# Patient Record
Sex: Female | Born: 1945
Health system: Southern US, Community
[De-identification: ages and names within clinical notes are randomized; demographics above are authoritative.]

## PROBLEM LIST (undated history)

## (undated) DIAGNOSIS — I351 Nonrheumatic aortic (valve) insufficiency: Secondary | ICD-10-CM

## (undated) DIAGNOSIS — E119 Type 2 diabetes mellitus without complications: Secondary | ICD-10-CM

## (undated) DIAGNOSIS — I2510 Atherosclerotic heart disease of native coronary artery without angina pectoris: Secondary | ICD-10-CM

## (undated) DIAGNOSIS — I700 Atherosclerosis of aorta: Secondary | ICD-10-CM

## (undated) DIAGNOSIS — Z7901 Long term (current) use of anticoagulants: Secondary | ICD-10-CM

## (undated) DIAGNOSIS — J410 Simple chronic bronchitis: Secondary | ICD-10-CM

## (undated) DIAGNOSIS — E782 Mixed hyperlipidemia: Secondary | ICD-10-CM

## (undated) DIAGNOSIS — S62102A Fracture of unspecified carpal bone, left wrist, initial encounter for closed fracture: Secondary | ICD-10-CM

## (undated) DIAGNOSIS — M1990 Unspecified osteoarthritis, unspecified site: Secondary | ICD-10-CM

## (undated) DIAGNOSIS — Z905 Acquired absence of kidney: Secondary | ICD-10-CM

## (undated) DIAGNOSIS — I4510 Unspecified right bundle-branch block: Secondary | ICD-10-CM

## (undated) DIAGNOSIS — R931 Abnormal findings on diagnostic imaging of heart and coronary circulation: Secondary | ICD-10-CM

## (undated) DIAGNOSIS — E039 Hypothyroidism, unspecified: Secondary | ICD-10-CM

## (undated) DIAGNOSIS — C641 Malignant neoplasm of right kidney, except renal pelvis: Secondary | ICD-10-CM

## (undated) DIAGNOSIS — I10 Essential (primary) hypertension: Secondary | ICD-10-CM

## (undated) DIAGNOSIS — I4819 Other persistent atrial fibrillation: Secondary | ICD-10-CM

## (undated) DIAGNOSIS — J449 Chronic obstructive pulmonary disease, unspecified: Secondary | ICD-10-CM

## (undated) DIAGNOSIS — N183 Chronic kidney disease, stage 3 unspecified: Secondary | ICD-10-CM

## (undated) DIAGNOSIS — E669 Obesity, unspecified: Secondary | ICD-10-CM

## (undated) DIAGNOSIS — N189 Chronic kidney disease, unspecified: Secondary | ICD-10-CM

## (undated) DIAGNOSIS — E785 Hyperlipidemia, unspecified: Secondary | ICD-10-CM

## (undated) HISTORY — DX: Obesity, unspecified: E66.9

## (undated) HISTORY — DX: Atherosclerosis of aorta: I70.0

## (undated) HISTORY — DX: Atherosclerotic heart disease of native coronary artery without angina pectoris: I25.10

## (undated) HISTORY — DX: Nonrheumatic aortic (valve) insufficiency: I35.1

## (undated) HISTORY — DX: Abnormal findings on diagnostic imaging of heart and coronary circulation: R93.1

## (undated) HISTORY — DX: Essential (primary) hypertension: I10

## (undated) HISTORY — DX: Other persistent atrial fibrillation: I48.19

## (undated) HISTORY — DX: Hyperlipidemia, unspecified: E78.5

## (undated) HISTORY — DX: Unspecified right bundle-branch block: I45.10

---

## 1978-02-13 HISTORY — PX: TUBAL LIGATION: SHX77

## 2010-08-25 ENCOUNTER — Encounter: Payer: Self-pay | Admitting: Gastroenterology

## 2010-10-04 ENCOUNTER — Encounter: Payer: Self-pay | Admitting: Gastroenterology

## 2010-10-04 ENCOUNTER — Ambulatory Visit (INDEPENDENT_AMBULATORY_CARE_PROVIDER_SITE_OTHER): Payer: 59 | Admitting: Gastroenterology

## 2010-10-04 DIAGNOSIS — E669 Obesity, unspecified: Secondary | ICD-10-CM

## 2010-10-04 DIAGNOSIS — E119 Type 2 diabetes mellitus without complications: Secondary | ICD-10-CM | POA: Insufficient documentation

## 2010-10-04 DIAGNOSIS — Z1211 Encounter for screening for malignant neoplasm of colon: Secondary | ICD-10-CM

## 2010-10-04 DIAGNOSIS — Z6835 Body mass index (BMI) 35.0-35.9, adult: Secondary | ICD-10-CM | POA: Insufficient documentation

## 2010-10-04 MED ORDER — PEG-KCL-NACL-NASULF-NA ASC-C 100 G PO SOLR: 1.0000 | ORAL | Status: DC

## 2010-10-04 NOTE — Progress Notes (Signed)
HPI: This is a  very pleasant 65 year old woman who is here to discuss colon cancer screening.  She had a colonoscopy, bad experience.  She says the sedation was terrible.  No polyps.  No overt GI bleeding, no bowel changes.  Overall stable weight.    Her daughter is Karie Mainland at [employer].    Review of systems: Pertinent positive and negative review of systems were noted in the above HPI section.  All other review of systems was otherwise negative.   Past Medical History  Diagnosis Date  . Atrial fibrillation   . Diabetes mellitus   . Hyperlipidemia   . Hypertension   . Obesity     Past Surgical History  Procedure Date  . Tubal ligation 1980     reports that she has quit smoking. She has never used smokeless tobacco. She reports that she does not drink alcohol or use illicit drugs.  family history includes Cancer in her mother and Diabetes in her father.  There is no history of Colon cancer.    Current Medications, Allergies were all reviewed with the patient via [facility] electronic medical record system.    Physical Exam: BP 132/76  Pulse 88  Ht 5\' 5"  (1.651 m)  Wt 245 lb (111.131 kg)  BMI 40.77 kg/m2 Constitutional: generally well-appearing Psychiatric: alert and oriented x3 Eyes: extraocular movements intact Mouth: oral pharynx moist, no lesions Neck: supple no lymphadenopathy Cardiovascular: heart regular rate and rhythm Lungs: clear to auscultation bilaterally Abdomen: soft, nontender, nondistended, no obvious ascites, no peritoneal signs, normal bowel sounds Extremities: no lower extremity edema bilaterally Skin: no lesions on visible extremities    Assessment and plan: 65 y.o. female with routine risk for colon cancer  She had difficult sedation at the time of her previous colonoscopy, about 10 years ago. We will therefore schedule her for repeat colonoscopy at [facility] with propofol sedation. I see no reason for any further  blood tests or imaging studies prior to then.

## 2010-10-04 NOTE — Patient Instructions (Signed)
You will be set up for a colonoscopy at [facility] with propofol (first case of the day, please). A copy of this information will be made available to Dr. Valentina Lucks.

## 2010-10-05 ENCOUNTER — Telehealth: Payer: Self-pay | Admitting: Gastroenterology

## 2010-10-05 ENCOUNTER — Encounter: Payer: Self-pay | Admitting: Gastroenterology

## 2010-10-05 NOTE — Telephone Encounter (Signed)
Schedule not out for nov will call pt in late sept to reschedule

## 2010-12-14 ENCOUNTER — Telehealth: Payer: Self-pay | Admitting: Gastroenterology

## 2010-12-14 NOTE — Telephone Encounter (Signed)
Pt wants to have her Daughter call and speak to me about her procedure, she is giving a verbal ok

## 2010-12-15 NOTE — Telephone Encounter (Signed)
[phone] Latasha Hodges the pt daughter works at [employer] endo and states that the pt or the daughter does not want the pt to have propofol for her procedure.  She says that the experience at her last colon was a bad experience from the start and not from the anesthesia.   Can we switch to moderate sedation?

## 2010-12-15 NOTE — Telephone Encounter (Signed)
Yes, it can be with moderate sedation.  thanks

## 2010-12-16 NOTE — Telephone Encounter (Signed)
Case was changed to moderate sedation and pre admit appt was cx.  Debbie her daughter is aware

## 2010-12-26 ENCOUNTER — Inpatient Hospital Stay (HOSPITAL_COMMUNITY): Admission: RE | Admit: 2010-12-26 | Payer: 59 | Source: Ambulatory Visit

## 2010-12-26 ENCOUNTER — Telehealth: Payer: Self-pay | Admitting: Gastroenterology

## 2010-12-26 NOTE — Telephone Encounter (Signed)
Left message on machine to call back  

## 2010-12-26 NOTE — Telephone Encounter (Signed)
Pt questions were answered and asked to have a copy of her instructions faxed to her to [phone]

## 2010-12-29 ENCOUNTER — Ambulatory Visit (HOSPITAL_COMMUNITY)
Admission: RE | Admit: 2010-12-29 | Discharge: 2010-12-29 | Disposition: A | Discharge status: Home / Self Care | Payer: 59 | Source: Ambulatory Visit | Attending: Gastroenterology | Admitting: Gastroenterology

## 2010-12-29 ENCOUNTER — Encounter (HOSPITAL_COMMUNITY)
Admission: RE | Disposition: A | Discharge status: Home / Self Care | Payer: Self-pay | Source: Ambulatory Visit | Attending: Gastroenterology

## 2010-12-29 ENCOUNTER — Encounter (HOSPITAL_COMMUNITY): Payer: Self-pay | Admitting: *Deleted

## 2010-12-29 ENCOUNTER — Other Ambulatory Visit: Payer: 59 | Admitting: Gastroenterology

## 2010-12-29 DIAGNOSIS — K5730 Diverticulosis of large intestine without perforation or abscess without bleeding: Secondary | ICD-10-CM | POA: Insufficient documentation

## 2010-12-29 DIAGNOSIS — I10 Essential (primary) hypertension: Secondary | ICD-10-CM | POA: Insufficient documentation

## 2010-12-29 DIAGNOSIS — Z1211 Encounter for screening for malignant neoplasm of colon: Secondary | ICD-10-CM | POA: Insufficient documentation

## 2010-12-29 DIAGNOSIS — Z87891 Personal history of nicotine dependence: Secondary | ICD-10-CM | POA: Insufficient documentation

## 2010-12-29 DIAGNOSIS — I4891 Unspecified atrial fibrillation: Secondary | ICD-10-CM | POA: Insufficient documentation

## 2010-12-29 DIAGNOSIS — E119 Type 2 diabetes mellitus without complications: Secondary | ICD-10-CM | POA: Insufficient documentation

## 2010-12-29 DIAGNOSIS — E785 Hyperlipidemia, unspecified: Secondary | ICD-10-CM | POA: Insufficient documentation

## 2010-12-29 DIAGNOSIS — Z800 Family history of malignant neoplasm of digestive organs: Secondary | ICD-10-CM | POA: Insufficient documentation

## 2010-12-29 DIAGNOSIS — E669 Obesity, unspecified: Secondary | ICD-10-CM | POA: Insufficient documentation

## 2010-12-29 HISTORY — PX: COLONOSCOPY: SHX5424

## 2010-12-29 HISTORY — DX: Simple chronic bronchitis: J41.0

## 2010-12-29 SURGERY — COLONOSCOPY
Anesthesia: Moderate Sedation

## 2010-12-29 MED ORDER — DIPHENHYDRAMINE HCL 50 MG/ML IJ SOLN
INTRAMUSCULAR | Status: AC
  Filled 2010-12-29: qty 1

## 2010-12-29 MED ORDER — FENTANYL NICU IV SYRINGE 50 MCG/ML
INJECTION | INTRAMUSCULAR | Status: DC | PRN
  Administered 2010-12-29 (×4): 25 ug via INTRAVENOUS

## 2010-12-29 MED ORDER — MIDAZOLAM HCL 10 MG/2ML IJ SOLN
INTRAMUSCULAR | Status: AC
  Filled 2010-12-29: qty 4

## 2010-12-29 MED ORDER — SODIUM CHLORIDE 0.9 % IV SOLN
Freq: Once | INTRAVENOUS | Status: AC
  Administered 2010-12-29: 500 mL via INTRAVENOUS

## 2010-12-29 MED ORDER — FENTANYL CITRATE 0.05 MG/ML IJ SOLN
INTRAMUSCULAR | Status: AC
  Filled 2010-12-29: qty 4

## 2010-12-29 MED ORDER — MIDAZOLAM HCL 5 MG/5ML IJ SOLN
INTRAMUSCULAR | Status: DC | PRN
  Administered 2010-12-29 (×3): 2 mg via INTRAVENOUS

## 2010-12-29 NOTE — H&P (Signed)
HPI: This is a woman who is here for routine colonoscopy.  Routine risk    Past Medical History  Diagnosis Date  . Atrial fibrillation   . Diabetes mellitus   . Hyperlipidemia   . Hypertension   . Obesity   . Bronchitis, chronic, simple     Past Surgical History  Procedure Date  . Tubal ligation 1980    Current Facility-Administered Medications  Medication Dose Route Frequency Provider Last Rate Last Dose  . 0.9 %  sodium chloride infusion   Intravenous Once Rob Bunting, MD 20 mL/hr at 12/29/10 0713 500 mL at 12/29/10 0713    Allergies as of 11/24/2010 - Review Complete 10/04/2010  Allergen Reaction Noted  . Iohexol  10/04/2010  . Penicillins  10/04/2010  . Sulfa drugs cross reactors  10/04/2010    Family History  Problem Relation Age of Onset  . Cancer Mother     BRAIN/THROAT  . Diabetes Father   . Hypertension Father   . Colon cancer Neg Hx   . Colonic polyp Cousin     History   Social History  . Marital Status: Divorced    Spouse Name: N/A    Number of Children: 2  . Years of Education: N/A   Occupational History  . ADMIN ASSISTANT    Social History Main Topics  . Smoking status: Former Smoker    Quit date: 12/28/1981  . Smokeless tobacco: Never Used  . Alcohol Use: No  . Drug Use: No  . Sexually Active: No   Other Topics Concern  . Not on file   Social History Narrative   0 caffeine drinks daily       Physical Exam: BP 140/75  Pulse 70  Temp(Src) 98.6 F (37 C) (Oral)  Resp 14  SpO2 96% Constitutional: generally well-appearing Psychiatric: alert and oriented x3 Abdomen: soft, nontender, nondistended, no obvious ascites, no peritoneal signs, normal bowel sounds     Assessment and plan: 65 y.o. female with routine risk for CRC  Colonoscopy today

## 2010-12-30 ENCOUNTER — Encounter (HOSPITAL_COMMUNITY): Payer: Self-pay

## 2011-01-10 ENCOUNTER — Encounter (HOSPITAL_COMMUNITY): Payer: Self-pay | Admitting: Gastroenterology

## 2012-07-01 DIAGNOSIS — Z23 Encounter for immunization: Secondary | ICD-10-CM | POA: Diagnosis not present

## 2012-07-01 DIAGNOSIS — I4891 Unspecified atrial fibrillation: Secondary | ICD-10-CM | POA: Diagnosis not present

## 2012-07-01 DIAGNOSIS — E782 Mixed hyperlipidemia: Secondary | ICD-10-CM | POA: Diagnosis not present

## 2012-07-01 DIAGNOSIS — I119 Hypertensive heart disease without heart failure: Secondary | ICD-10-CM | POA: Diagnosis not present

## 2012-09-23 DIAGNOSIS — I4891 Unspecified atrial fibrillation: Secondary | ICD-10-CM | POA: Diagnosis not present

## 2012-09-23 DIAGNOSIS — I10 Essential (primary) hypertension: Secondary | ICD-10-CM | POA: Diagnosis not present

## 2012-09-23 DIAGNOSIS — R002 Palpitations: Secondary | ICD-10-CM | POA: Diagnosis not present

## 2012-09-24 DIAGNOSIS — I4891 Unspecified atrial fibrillation: Secondary | ICD-10-CM | POA: Diagnosis not present

## 2012-09-26 DIAGNOSIS — R002 Palpitations: Secondary | ICD-10-CM | POA: Diagnosis not present

## 2012-09-26 DIAGNOSIS — I10 Essential (primary) hypertension: Secondary | ICD-10-CM | POA: Diagnosis not present

## 2012-09-26 DIAGNOSIS — I4891 Unspecified atrial fibrillation: Secondary | ICD-10-CM | POA: Diagnosis not present

## 2012-11-07 DIAGNOSIS — I4891 Unspecified atrial fibrillation: Secondary | ICD-10-CM | POA: Diagnosis not present

## 2012-11-07 DIAGNOSIS — R002 Palpitations: Secondary | ICD-10-CM | POA: Diagnosis not present

## 2012-11-07 DIAGNOSIS — I10 Essential (primary) hypertension: Secondary | ICD-10-CM | POA: Diagnosis not present

## 2012-12-18 DIAGNOSIS — M48061 Spinal stenosis, lumbar region without neurogenic claudication: Secondary | ICD-10-CM | POA: Diagnosis not present

## 2013-01-01 ENCOUNTER — Other Ambulatory Visit: Payer: Self-pay | Admitting: Neurological Surgery

## 2013-01-15 ENCOUNTER — Ambulatory Visit
Admission: RE | Admit: 2013-01-15 | Discharge: 2013-01-15 | Disposition: A | Discharge status: Home / Self Care | Payer: 59 | Source: Ambulatory Visit | Attending: Neurological Surgery | Admitting: Neurological Surgery

## 2013-01-15 DIAGNOSIS — M48061 Spinal stenosis, lumbar region without neurogenic claudication: Secondary | ICD-10-CM | POA: Diagnosis not present

## 2013-01-15 DIAGNOSIS — M5126 Other intervertebral disc displacement, lumbar region: Secondary | ICD-10-CM | POA: Diagnosis not present

## 2013-01-15 DIAGNOSIS — M4310 Spondylolisthesis, site unspecified: Secondary | ICD-10-CM | POA: Diagnosis not present

## 2013-01-17 ENCOUNTER — Telehealth: Payer: Self-pay | Admitting: Cardiology

## 2013-01-17 DIAGNOSIS — M4310 Spondylolisthesis, site unspecified: Secondary | ICD-10-CM | POA: Insufficient documentation

## 2013-01-17 DIAGNOSIS — M48061 Spinal stenosis, lumbar region without neurogenic claudication: Secondary | ICD-10-CM | POA: Diagnosis not present

## 2013-01-17 NOTE — Telephone Encounter (Signed)
To Dr Turner to advise 

## 2013-01-17 NOTE — Telephone Encounter (Signed)
New Problem:  Meloxicam 15 mg take once a day... Methocarbamol 500 mg take one every 6 hrs prn for spasms. Pt is wanting to make sure these two medications are ok for her to take. Pt states she was given them today at [facility]. Pt is requesting a call back

## 2013-01-20 NOTE — Telephone Encounter (Signed)
Follow up    Pt would like a call back med in previous message, 4 pm the lastess before she goes and fills scprit.

## 2013-01-20 NOTE — Telephone Encounter (Signed)
Pt is aware.  

## 2013-01-20 NOTE — Telephone Encounter (Signed)
Those are fine to take with her heart meds

## 2013-01-20 NOTE — Telephone Encounter (Signed)
Waiting on response from Dr Turner 

## 2013-03-18 ENCOUNTER — Encounter: Payer: Self-pay | Admitting: General Surgery

## 2013-03-27 ENCOUNTER — Ambulatory Visit: Payer: 59 | Admitting: Cardiology

## 2013-03-30 ENCOUNTER — Encounter: Payer: Self-pay | Admitting: *Deleted

## 2013-04-01 ENCOUNTER — Telehealth: Payer: Self-pay | Admitting: Nurse Practitioner

## 2013-04-01 MED ORDER — METOPROLOL TARTRATE 50 MG PO TABS: 50.0000 mg | ORAL_TABLET | Freq: Every day | ORAL | Status: DC

## 2013-04-01 NOTE — Telephone Encounter (Signed)
Phone call today  Patient requesting her Metoprolol 50 mg po daily.  Looks like she cancelled her last visit. Not clear to me as to when she was last seen. Has no upcoming visit.  Only sent in #90 with no refills. She was asked to call the office tomorrow to schedule follow up.  Patient is agreeable to this plan and will call if any problems develop in the interim.   Burtis Junes, RN, [place] [address] [place], [place]  [address] [phone]

## 2013-04-04 ENCOUNTER — Telehealth: Payer: Self-pay | Admitting: Cardiology

## 2013-04-04 MED ORDER — METOPROLOL SUCCINATE ER 50 MG PO TB24: ORAL_TABLET | ORAL | Status: DC

## 2013-04-04 NOTE — Telephone Encounter (Signed)
Refilled Toprol XL and pt will pick rx up today. Annual appt made.

## 2013-04-04 NOTE — Telephone Encounter (Signed)
New message     Refill metoprolol succinate 50mg ---[facility]   Pt has not seen dr turner in the new office.  Pt called after hrs doctor and they called in the wrong medication (according to the pharmacy).  Pt want to talk to a nurse to see what she can do with the wrong medication

## 2013-04-15 DIAGNOSIS — I10 Essential (primary) hypertension: Secondary | ICD-10-CM | POA: Diagnosis not present

## 2013-04-15 DIAGNOSIS — E782 Mixed hyperlipidemia: Secondary | ICD-10-CM | POA: Diagnosis not present

## 2013-04-15 DIAGNOSIS — I4891 Unspecified atrial fibrillation: Secondary | ICD-10-CM | POA: Diagnosis not present

## 2013-04-15 DIAGNOSIS — IMO0001 Reserved for inherently not codable concepts without codable children: Secondary | ICD-10-CM | POA: Diagnosis not present

## 2013-04-15 DIAGNOSIS — M4800 Spinal stenosis, site unspecified: Secondary | ICD-10-CM | POA: Diagnosis not present

## 2013-04-15 DIAGNOSIS — E669 Obesity, unspecified: Secondary | ICD-10-CM | POA: Diagnosis not present

## 2013-04-15 DIAGNOSIS — I119 Hypertensive heart disease without heart failure: Secondary | ICD-10-CM | POA: Diagnosis not present

## 2013-04-24 DIAGNOSIS — I10 Essential (primary) hypertension: Secondary | ICD-10-CM | POA: Diagnosis not present

## 2013-04-24 DIAGNOSIS — Z6834 Body mass index (BMI) 34.0-34.9, adult: Secondary | ICD-10-CM | POA: Diagnosis not present

## 2013-04-24 DIAGNOSIS — M4310 Spondylolisthesis, site unspecified: Secondary | ICD-10-CM | POA: Diagnosis not present

## 2013-04-29 ENCOUNTER — Other Ambulatory Visit: Payer: Self-pay | Admitting: Cardiology

## 2013-04-29 ENCOUNTER — Other Ambulatory Visit: Payer: Self-pay

## 2013-04-29 DIAGNOSIS — R293 Abnormal posture: Secondary | ICD-10-CM | POA: Diagnosis not present

## 2013-04-29 DIAGNOSIS — M545 Low back pain, unspecified: Secondary | ICD-10-CM | POA: Diagnosis not present

## 2013-04-29 DIAGNOSIS — R262 Difficulty in walking, not elsewhere classified: Secondary | ICD-10-CM | POA: Diagnosis not present

## 2013-04-29 DIAGNOSIS — M25569 Pain in unspecified knee: Secondary | ICD-10-CM | POA: Diagnosis not present

## 2013-04-29 MED ORDER — FLECAINIDE ACETATE 50 MG PO TABS: 100.0000 mg | ORAL_TABLET | Freq: Two times a day (BID) | ORAL | Status: DC

## 2013-05-01 DIAGNOSIS — M545 Low back pain, unspecified: Secondary | ICD-10-CM | POA: Diagnosis not present

## 2013-05-01 DIAGNOSIS — R262 Difficulty in walking, not elsewhere classified: Secondary | ICD-10-CM | POA: Diagnosis not present

## 2013-05-01 DIAGNOSIS — M25569 Pain in unspecified knee: Secondary | ICD-10-CM | POA: Diagnosis not present

## 2013-05-01 DIAGNOSIS — R293 Abnormal posture: Secondary | ICD-10-CM | POA: Diagnosis not present

## 2013-05-06 ENCOUNTER — Telehealth: Payer: Self-pay | Admitting: Cardiology

## 2013-05-06 ENCOUNTER — Other Ambulatory Visit: Payer: Self-pay

## 2013-05-06 DIAGNOSIS — R293 Abnormal posture: Secondary | ICD-10-CM | POA: Diagnosis not present

## 2013-05-06 DIAGNOSIS — R262 Difficulty in walking, not elsewhere classified: Secondary | ICD-10-CM | POA: Diagnosis not present

## 2013-05-06 DIAGNOSIS — M545 Low back pain, unspecified: Secondary | ICD-10-CM | POA: Diagnosis not present

## 2013-05-06 DIAGNOSIS — M25569 Pain in unspecified knee: Secondary | ICD-10-CM | POA: Diagnosis not present

## 2013-05-06 MED ORDER — FLECAINIDE ACETATE 100 MG PO TABS: ORAL_TABLET | ORAL | Status: DC

## 2013-05-07 DIAGNOSIS — M25569 Pain in unspecified knee: Secondary | ICD-10-CM | POA: Diagnosis not present

## 2013-05-07 DIAGNOSIS — M545 Low back pain, unspecified: Secondary | ICD-10-CM | POA: Diagnosis not present

## 2013-05-07 DIAGNOSIS — R262 Difficulty in walking, not elsewhere classified: Secondary | ICD-10-CM | POA: Diagnosis not present

## 2013-05-07 DIAGNOSIS — R293 Abnormal posture: Secondary | ICD-10-CM | POA: Diagnosis not present

## 2013-05-12 DIAGNOSIS — R262 Difficulty in walking, not elsewhere classified: Secondary | ICD-10-CM | POA: Diagnosis not present

## 2013-05-12 DIAGNOSIS — M545 Low back pain, unspecified: Secondary | ICD-10-CM | POA: Diagnosis not present

## 2013-05-12 DIAGNOSIS — R293 Abnormal posture: Secondary | ICD-10-CM | POA: Diagnosis not present

## 2013-05-12 DIAGNOSIS — M25569 Pain in unspecified knee: Secondary | ICD-10-CM | POA: Diagnosis not present

## 2013-05-13 DIAGNOSIS — M545 Low back pain, unspecified: Secondary | ICD-10-CM | POA: Diagnosis not present

## 2013-05-13 DIAGNOSIS — R262 Difficulty in walking, not elsewhere classified: Secondary | ICD-10-CM | POA: Diagnosis not present

## 2013-05-13 DIAGNOSIS — M25569 Pain in unspecified knee: Secondary | ICD-10-CM | POA: Diagnosis not present

## 2013-05-13 DIAGNOSIS — R293 Abnormal posture: Secondary | ICD-10-CM | POA: Diagnosis not present

## 2013-05-26 DIAGNOSIS — M545 Low back pain, unspecified: Secondary | ICD-10-CM | POA: Diagnosis not present

## 2013-05-26 DIAGNOSIS — M25569 Pain in unspecified knee: Secondary | ICD-10-CM | POA: Diagnosis not present

## 2013-05-26 DIAGNOSIS — R293 Abnormal posture: Secondary | ICD-10-CM | POA: Diagnosis not present

## 2013-05-26 DIAGNOSIS — R262 Difficulty in walking, not elsewhere classified: Secondary | ICD-10-CM | POA: Diagnosis not present

## 2013-05-27 ENCOUNTER — Ambulatory Visit (INDEPENDENT_AMBULATORY_CARE_PROVIDER_SITE_OTHER): Payer: Medicare Other | Admitting: Cardiology

## 2013-05-27 ENCOUNTER — Encounter: Payer: Self-pay | Admitting: Cardiology

## 2013-05-27 VITALS — BP 148/78 | HR 64 | Ht 65.0 in | Wt 236.0 lb

## 2013-05-27 DIAGNOSIS — R262 Difficulty in walking, not elsewhere classified: Secondary | ICD-10-CM | POA: Diagnosis not present

## 2013-05-27 DIAGNOSIS — R293 Abnormal posture: Secondary | ICD-10-CM | POA: Diagnosis not present

## 2013-05-27 DIAGNOSIS — I4891 Unspecified atrial fibrillation: Secondary | ICD-10-CM | POA: Diagnosis not present

## 2013-05-27 DIAGNOSIS — M545 Low back pain, unspecified: Secondary | ICD-10-CM | POA: Diagnosis not present

## 2013-05-27 DIAGNOSIS — I10 Essential (primary) hypertension: Secondary | ICD-10-CM | POA: Diagnosis not present

## 2013-05-27 DIAGNOSIS — M25569 Pain in unspecified knee: Secondary | ICD-10-CM | POA: Diagnosis not present

## 2013-05-27 DIAGNOSIS — I4819 Other persistent atrial fibrillation: Secondary | ICD-10-CM | POA: Insufficient documentation

## 2013-05-27 NOTE — Patient Instructions (Signed)
Your physician recommends that you continue on your current medications as directed. Please refer to the Current Medication list given to you today.  Your physician wants you to follow-up in: 6 months with Dr Turner You will receive a reminder letter in the mail two months in advance. If you don't receive a letter, please call our office to schedule the follow-up appointment.  

## 2013-05-27 NOTE — Progress Notes (Signed)
[place], [place], [place]  [address] Phone: [phone] Fax:  [phone]  Date:  05/27/2013   ID:  Latasha Hodges, DOB 03-07-1945, MRN [id number]  PCP:  Osborne Casco, MD  Cardiologist:  Fransico Him, MD     History of Present Illness: Latasha Hodges is a 68 y.o. female with a history of PAF and HTN presents today for followup.  She is doing well.  SHe denies any chest pain, SOB, DOE, LE edema, dizziness or syncope.  She has not noticed any skipped beats or PAF.     Wt Readings from Last 3 Encounters:  05/27/13 236 lb (107.049 kg)  10/04/10 245 lb (111.131 kg)     Past Medical History  Diagnosis Date  . Atrial fibrillation   . Diabetes mellitus   . Hyperlipidemia   . Obesity   . Bronchitis, chronic, simple   . Hypertension     Current Outpatient Prescriptions  Medication Sig Dispense Refill  . aspirin 81 MG tablet Take 81 mg by mouth daily.        . fenofibrate micronized (LOFIBRA) 200 MG capsule Take 200 mg by mouth daily before breakfast.        . flecainide (TAMBOCOR) 100 MG tablet Take one tablet twice a day  270 tablet  3  . ipratropium (ATROVENT HFA) 17 MCG/ACT inhaler Inhale 2 puffs into the lungs every 6 (six) hours.      Marland Kitchen ipratropium (ATROVENT) 0.02 % nebulizer solution Take 0.5 mg by nebulization every 4 (four) hours as needed for wheezing or shortness of breath.      . lisinopril (PRINIVIL,ZESTRIL) 20 MG tablet TAKE 1 TABLET BY MOUTH ORALLY  90 tablet  1  . metFORMIN (GLUCOPHAGE) 500 MG tablet Take 1,000 mg by mouth 2 (two) times daily with a meal.       . metoprolol succinate (TOPROL XL) 50 MG 24 hr tablet 1 tablet by mouth daily  90 tablet  0  . saxagliptin HCl (ONGLYZA) 2.5 MG TABS tablet Take 2.5 mg by mouth daily.      . simvastatin (ZOCOR) 40 MG tablet Take 40 mg by mouth at bedtime.         No current facility-administered medications for this visit.    Allergies:    Allergies  Allergen Reactions  . Iodinated  Diagnostic Agents Hives, Swelling and Rash  . Penicillins   . Sulfa Drugs Cross Reactors     Social History:  The patient  reports that she quit smoking about 31 years ago. She has never used smokeless tobacco. She reports that she does not drink alcohol or use illicit drugs.   Family History:  The patient's family history includes Cancer in her mother; Colonic polyp in her cousin; Diabetes in her father; Hypertension in her father. There is no history of Colon cancer.   ROS:  Please see the history of present illness.      All other systems reviewed and negative.   PHYSICAL EXAM: VS:  BP 148/78  Pulse 64  Ht 5\' 5"  (1.651 m)  Wt 236 lb (107.049 kg)  BMI 39.27 kg/m2 Well nourished, well developed, in no acute distress HEENT: normal Neck: no JVD Cardiac:  normal S1, S2; RRR; no murmur Lungs:  clear to auscultation bilaterally, no wheezing, rhonchi or rales Abd: soft, nontender, no hepatomegaly Ext: no edema Skin: warm and dry Neuro:  CNs 2-12 intact, no focal abnormalities noted  EKG:  NSR with no ST changes  and normal intervals     ASSESSMENT AND PLAN:  1. PAF maintaining NSR - continue ASA/flecainide/metoprolol 2. HTN borderline control - continue metoprolol/Lisinopril - encouraged her to follow a low sodium diet  Followup with me in 6 months  Signed, Fransico Him, MD 05/27/2013 8:29 AM

## 2013-05-29 ENCOUNTER — Other Ambulatory Visit: Payer: Self-pay

## 2013-05-29 DIAGNOSIS — M545 Low back pain, unspecified: Secondary | ICD-10-CM | POA: Diagnosis not present

## 2013-05-29 DIAGNOSIS — R293 Abnormal posture: Secondary | ICD-10-CM | POA: Diagnosis not present

## 2013-05-29 DIAGNOSIS — M25569 Pain in unspecified knee: Secondary | ICD-10-CM | POA: Diagnosis not present

## 2013-05-29 DIAGNOSIS — R262 Difficulty in walking, not elsewhere classified: Secondary | ICD-10-CM | POA: Diagnosis not present

## 2013-05-29 MED ORDER — FENOFIBRATE MICRONIZED 200 MG PO CAPS: 200.0000 mg | ORAL_CAPSULE | Freq: Every day | ORAL | Status: DC

## 2013-05-29 MED ORDER — SIMVASTATIN 40 MG PO TABS: 40.0000 mg | ORAL_TABLET | Freq: Every day | ORAL | Status: DC

## 2013-06-02 DIAGNOSIS — M545 Low back pain, unspecified: Secondary | ICD-10-CM | POA: Diagnosis not present

## 2013-06-02 DIAGNOSIS — M25569 Pain in unspecified knee: Secondary | ICD-10-CM | POA: Diagnosis not present

## 2013-06-02 DIAGNOSIS — R262 Difficulty in walking, not elsewhere classified: Secondary | ICD-10-CM | POA: Diagnosis not present

## 2013-06-02 DIAGNOSIS — R293 Abnormal posture: Secondary | ICD-10-CM | POA: Diagnosis not present

## 2013-06-03 DIAGNOSIS — R262 Difficulty in walking, not elsewhere classified: Secondary | ICD-10-CM | POA: Diagnosis not present

## 2013-06-03 DIAGNOSIS — M545 Low back pain, unspecified: Secondary | ICD-10-CM | POA: Diagnosis not present

## 2013-06-03 DIAGNOSIS — M25569 Pain in unspecified knee: Secondary | ICD-10-CM | POA: Diagnosis not present

## 2013-06-03 DIAGNOSIS — R293 Abnormal posture: Secondary | ICD-10-CM | POA: Diagnosis not present

## 2013-06-05 DIAGNOSIS — M545 Low back pain, unspecified: Secondary | ICD-10-CM | POA: Diagnosis not present

## 2013-06-05 DIAGNOSIS — R262 Difficulty in walking, not elsewhere classified: Secondary | ICD-10-CM | POA: Diagnosis not present

## 2013-06-05 DIAGNOSIS — M25569 Pain in unspecified knee: Secondary | ICD-10-CM | POA: Diagnosis not present

## 2013-06-05 DIAGNOSIS — R293 Abnormal posture: Secondary | ICD-10-CM | POA: Diagnosis not present

## 2013-06-10 DIAGNOSIS — M545 Low back pain, unspecified: Secondary | ICD-10-CM | POA: Diagnosis not present

## 2013-06-10 DIAGNOSIS — M25569 Pain in unspecified knee: Secondary | ICD-10-CM | POA: Diagnosis not present

## 2013-06-10 DIAGNOSIS — R293 Abnormal posture: Secondary | ICD-10-CM | POA: Diagnosis not present

## 2013-06-10 DIAGNOSIS — R262 Difficulty in walking, not elsewhere classified: Secondary | ICD-10-CM | POA: Diagnosis not present

## 2013-06-12 DIAGNOSIS — R262 Difficulty in walking, not elsewhere classified: Secondary | ICD-10-CM | POA: Diagnosis not present

## 2013-06-12 DIAGNOSIS — M545 Low back pain, unspecified: Secondary | ICD-10-CM | POA: Diagnosis not present

## 2013-06-12 DIAGNOSIS — M25569 Pain in unspecified knee: Secondary | ICD-10-CM | POA: Diagnosis not present

## 2013-06-12 DIAGNOSIS — R293 Abnormal posture: Secondary | ICD-10-CM | POA: Diagnosis not present

## 2013-06-16 DIAGNOSIS — M25569 Pain in unspecified knee: Secondary | ICD-10-CM | POA: Diagnosis not present

## 2013-06-16 DIAGNOSIS — M545 Low back pain, unspecified: Secondary | ICD-10-CM | POA: Diagnosis not present

## 2013-06-16 DIAGNOSIS — R262 Difficulty in walking, not elsewhere classified: Secondary | ICD-10-CM | POA: Diagnosis not present

## 2013-06-16 DIAGNOSIS — R293 Abnormal posture: Secondary | ICD-10-CM | POA: Diagnosis not present

## 2013-06-17 DIAGNOSIS — M25569 Pain in unspecified knee: Secondary | ICD-10-CM | POA: Diagnosis not present

## 2013-06-17 DIAGNOSIS — M545 Low back pain, unspecified: Secondary | ICD-10-CM | POA: Diagnosis not present

## 2013-06-17 DIAGNOSIS — R262 Difficulty in walking, not elsewhere classified: Secondary | ICD-10-CM | POA: Diagnosis not present

## 2013-06-17 DIAGNOSIS — R293 Abnormal posture: Secondary | ICD-10-CM | POA: Diagnosis not present

## 2013-06-19 DIAGNOSIS — R293 Abnormal posture: Secondary | ICD-10-CM | POA: Diagnosis not present

## 2013-06-19 DIAGNOSIS — R262 Difficulty in walking, not elsewhere classified: Secondary | ICD-10-CM | POA: Diagnosis not present

## 2013-06-19 DIAGNOSIS — M545 Low back pain, unspecified: Secondary | ICD-10-CM | POA: Diagnosis not present

## 2013-06-19 DIAGNOSIS — M25569 Pain in unspecified knee: Secondary | ICD-10-CM | POA: Diagnosis not present

## 2013-06-23 DIAGNOSIS — R262 Difficulty in walking, not elsewhere classified: Secondary | ICD-10-CM | POA: Diagnosis not present

## 2013-06-23 DIAGNOSIS — M545 Low back pain, unspecified: Secondary | ICD-10-CM | POA: Diagnosis not present

## 2013-06-23 DIAGNOSIS — R293 Abnormal posture: Secondary | ICD-10-CM | POA: Diagnosis not present

## 2013-06-23 DIAGNOSIS — M25569 Pain in unspecified knee: Secondary | ICD-10-CM | POA: Diagnosis not present

## 2013-06-26 DIAGNOSIS — R262 Difficulty in walking, not elsewhere classified: Secondary | ICD-10-CM | POA: Diagnosis not present

## 2013-06-26 DIAGNOSIS — M545 Low back pain, unspecified: Secondary | ICD-10-CM | POA: Diagnosis not present

## 2013-06-26 DIAGNOSIS — M25569 Pain in unspecified knee: Secondary | ICD-10-CM | POA: Diagnosis not present

## 2013-06-26 DIAGNOSIS — R293 Abnormal posture: Secondary | ICD-10-CM | POA: Diagnosis not present

## 2013-06-27 ENCOUNTER — Other Ambulatory Visit: Payer: Self-pay | Admitting: Cardiology

## 2013-07-01 DIAGNOSIS — M25569 Pain in unspecified knee: Secondary | ICD-10-CM | POA: Diagnosis not present

## 2013-07-01 DIAGNOSIS — R262 Difficulty in walking, not elsewhere classified: Secondary | ICD-10-CM | POA: Diagnosis not present

## 2013-07-01 DIAGNOSIS — M545 Low back pain, unspecified: Secondary | ICD-10-CM | POA: Diagnosis not present

## 2013-07-01 DIAGNOSIS — R293 Abnormal posture: Secondary | ICD-10-CM | POA: Diagnosis not present

## 2013-07-03 DIAGNOSIS — R293 Abnormal posture: Secondary | ICD-10-CM | POA: Diagnosis not present

## 2013-07-03 DIAGNOSIS — M545 Low back pain, unspecified: Secondary | ICD-10-CM | POA: Diagnosis not present

## 2013-07-03 DIAGNOSIS — R262 Difficulty in walking, not elsewhere classified: Secondary | ICD-10-CM | POA: Diagnosis not present

## 2013-07-03 DIAGNOSIS — M25569 Pain in unspecified knee: Secondary | ICD-10-CM | POA: Diagnosis not present

## 2013-07-08 DIAGNOSIS — R293 Abnormal posture: Secondary | ICD-10-CM | POA: Diagnosis not present

## 2013-07-08 DIAGNOSIS — M545 Low back pain, unspecified: Secondary | ICD-10-CM | POA: Diagnosis not present

## 2013-07-08 DIAGNOSIS — M25569 Pain in unspecified knee: Secondary | ICD-10-CM | POA: Diagnosis not present

## 2013-07-08 DIAGNOSIS — R262 Difficulty in walking, not elsewhere classified: Secondary | ICD-10-CM | POA: Diagnosis not present

## 2013-07-10 DIAGNOSIS — M545 Low back pain, unspecified: Secondary | ICD-10-CM | POA: Diagnosis not present

## 2013-07-10 DIAGNOSIS — R293 Abnormal posture: Secondary | ICD-10-CM | POA: Diagnosis not present

## 2013-07-10 DIAGNOSIS — R262 Difficulty in walking, not elsewhere classified: Secondary | ICD-10-CM | POA: Diagnosis not present

## 2013-07-10 DIAGNOSIS — M25569 Pain in unspecified knee: Secondary | ICD-10-CM | POA: Diagnosis not present

## 2013-07-31 DIAGNOSIS — M999 Biomechanical lesion, unspecified: Secondary | ICD-10-CM | POA: Diagnosis not present

## 2013-07-31 DIAGNOSIS — M545 Low back pain, unspecified: Secondary | ICD-10-CM | POA: Diagnosis not present

## 2013-07-31 DIAGNOSIS — M48061 Spinal stenosis, lumbar region without neurogenic claudication: Secondary | ICD-10-CM | POA: Diagnosis not present

## 2013-07-31 DIAGNOSIS — IMO0002 Reserved for concepts with insufficient information to code with codable children: Secondary | ICD-10-CM | POA: Diagnosis not present

## 2013-08-05 DIAGNOSIS — M999 Biomechanical lesion, unspecified: Secondary | ICD-10-CM | POA: Diagnosis not present

## 2013-08-05 DIAGNOSIS — M545 Low back pain, unspecified: Secondary | ICD-10-CM | POA: Diagnosis not present

## 2013-08-05 DIAGNOSIS — IMO0002 Reserved for concepts with insufficient information to code with codable children: Secondary | ICD-10-CM | POA: Diagnosis not present

## 2013-08-05 DIAGNOSIS — M48061 Spinal stenosis, lumbar region without neurogenic claudication: Secondary | ICD-10-CM | POA: Diagnosis not present

## 2013-08-07 DIAGNOSIS — M999 Biomechanical lesion, unspecified: Secondary | ICD-10-CM | POA: Diagnosis not present

## 2013-08-07 DIAGNOSIS — M48061 Spinal stenosis, lumbar region without neurogenic claudication: Secondary | ICD-10-CM | POA: Diagnosis not present

## 2013-08-07 DIAGNOSIS — M545 Low back pain, unspecified: Secondary | ICD-10-CM | POA: Diagnosis not present

## 2013-08-07 DIAGNOSIS — IMO0002 Reserved for concepts with insufficient information to code with codable children: Secondary | ICD-10-CM | POA: Diagnosis not present

## 2013-08-19 DIAGNOSIS — M545 Low back pain, unspecified: Secondary | ICD-10-CM | POA: Diagnosis not present

## 2013-08-19 DIAGNOSIS — IMO0002 Reserved for concepts with insufficient information to code with codable children: Secondary | ICD-10-CM | POA: Diagnosis not present

## 2013-08-19 DIAGNOSIS — M999 Biomechanical lesion, unspecified: Secondary | ICD-10-CM | POA: Diagnosis not present

## 2013-08-19 DIAGNOSIS — M48061 Spinal stenosis, lumbar region without neurogenic claudication: Secondary | ICD-10-CM | POA: Diagnosis not present

## 2013-08-21 DIAGNOSIS — M545 Low back pain, unspecified: Secondary | ICD-10-CM | POA: Diagnosis not present

## 2013-08-21 DIAGNOSIS — M48061 Spinal stenosis, lumbar region without neurogenic claudication: Secondary | ICD-10-CM | POA: Diagnosis not present

## 2013-08-21 DIAGNOSIS — IMO0002 Reserved for concepts with insufficient information to code with codable children: Secondary | ICD-10-CM | POA: Diagnosis not present

## 2013-08-21 DIAGNOSIS — M999 Biomechanical lesion, unspecified: Secondary | ICD-10-CM | POA: Diagnosis not present

## 2013-08-26 DIAGNOSIS — M999 Biomechanical lesion, unspecified: Secondary | ICD-10-CM | POA: Diagnosis not present

## 2013-08-26 DIAGNOSIS — IMO0002 Reserved for concepts with insufficient information to code with codable children: Secondary | ICD-10-CM | POA: Diagnosis not present

## 2013-08-26 DIAGNOSIS — M48061 Spinal stenosis, lumbar region without neurogenic claudication: Secondary | ICD-10-CM | POA: Diagnosis not present

## 2013-08-26 DIAGNOSIS — M545 Low back pain, unspecified: Secondary | ICD-10-CM | POA: Diagnosis not present

## 2013-08-28 DIAGNOSIS — M999 Biomechanical lesion, unspecified: Secondary | ICD-10-CM | POA: Diagnosis not present

## 2013-08-28 DIAGNOSIS — M545 Low back pain, unspecified: Secondary | ICD-10-CM | POA: Diagnosis not present

## 2013-08-28 DIAGNOSIS — M4310 Spondylolisthesis, site unspecified: Secondary | ICD-10-CM | POA: Diagnosis not present

## 2013-08-28 DIAGNOSIS — Z6841 Body Mass Index (BMI) 40.0 and over, adult: Secondary | ICD-10-CM | POA: Diagnosis not present

## 2013-08-28 DIAGNOSIS — M48061 Spinal stenosis, lumbar region without neurogenic claudication: Secondary | ICD-10-CM | POA: Diagnosis not present

## 2013-08-28 DIAGNOSIS — IMO0002 Reserved for concepts with insufficient information to code with codable children: Secondary | ICD-10-CM | POA: Diagnosis not present

## 2013-09-02 DIAGNOSIS — M999 Biomechanical lesion, unspecified: Secondary | ICD-10-CM | POA: Diagnosis not present

## 2013-09-02 DIAGNOSIS — M48061 Spinal stenosis, lumbar region without neurogenic claudication: Secondary | ICD-10-CM | POA: Diagnosis not present

## 2013-09-02 DIAGNOSIS — IMO0002 Reserved for concepts with insufficient information to code with codable children: Secondary | ICD-10-CM | POA: Diagnosis not present

## 2013-09-02 DIAGNOSIS — M545 Low back pain, unspecified: Secondary | ICD-10-CM | POA: Diagnosis not present

## 2013-09-04 DIAGNOSIS — M545 Low back pain, unspecified: Secondary | ICD-10-CM | POA: Diagnosis not present

## 2013-09-04 DIAGNOSIS — IMO0002 Reserved for concepts with insufficient information to code with codable children: Secondary | ICD-10-CM | POA: Diagnosis not present

## 2013-09-04 DIAGNOSIS — M999 Biomechanical lesion, unspecified: Secondary | ICD-10-CM | POA: Diagnosis not present

## 2013-09-04 DIAGNOSIS — M48061 Spinal stenosis, lumbar region without neurogenic claudication: Secondary | ICD-10-CM | POA: Diagnosis not present

## 2013-09-05 ENCOUNTER — Encounter: Payer: Self-pay | Admitting: Cardiology

## 2013-09-09 DIAGNOSIS — M545 Low back pain, unspecified: Secondary | ICD-10-CM | POA: Diagnosis not present

## 2013-09-09 DIAGNOSIS — IMO0002 Reserved for concepts with insufficient information to code with codable children: Secondary | ICD-10-CM | POA: Diagnosis not present

## 2013-09-09 DIAGNOSIS — M999 Biomechanical lesion, unspecified: Secondary | ICD-10-CM | POA: Diagnosis not present

## 2013-09-09 DIAGNOSIS — M48061 Spinal stenosis, lumbar region without neurogenic claudication: Secondary | ICD-10-CM | POA: Diagnosis not present

## 2013-09-11 DIAGNOSIS — M545 Low back pain, unspecified: Secondary | ICD-10-CM | POA: Diagnosis not present

## 2013-09-11 DIAGNOSIS — IMO0002 Reserved for concepts with insufficient information to code with codable children: Secondary | ICD-10-CM | POA: Diagnosis not present

## 2013-09-11 DIAGNOSIS — M999 Biomechanical lesion, unspecified: Secondary | ICD-10-CM | POA: Diagnosis not present

## 2013-09-11 DIAGNOSIS — M48061 Spinal stenosis, lumbar region without neurogenic claudication: Secondary | ICD-10-CM | POA: Diagnosis not present

## 2013-09-17 ENCOUNTER — Other Ambulatory Visit: Payer: Self-pay | Admitting: Family Medicine

## 2013-09-17 ENCOUNTER — Encounter (INDEPENDENT_AMBULATORY_CARE_PROVIDER_SITE_OTHER): Payer: Self-pay

## 2013-09-17 ENCOUNTER — Ambulatory Visit
Admission: RE | Admit: 2013-09-17 | Discharge: 2013-09-17 | Disposition: A | Discharge status: Home / Self Care | Payer: Medicare Other | Source: Ambulatory Visit | Attending: Family Medicine | Admitting: Family Medicine

## 2013-09-17 DIAGNOSIS — M545 Low back pain, unspecified: Secondary | ICD-10-CM | POA: Diagnosis not present

## 2013-09-17 DIAGNOSIS — M25551 Pain in right hip: Secondary | ICD-10-CM

## 2013-09-17 DIAGNOSIS — M48061 Spinal stenosis, lumbar region without neurogenic claudication: Secondary | ICD-10-CM | POA: Diagnosis not present

## 2013-09-17 DIAGNOSIS — M999 Biomechanical lesion, unspecified: Secondary | ICD-10-CM | POA: Diagnosis not present

## 2013-09-17 DIAGNOSIS — IMO0002 Reserved for concepts with insufficient information to code with codable children: Secondary | ICD-10-CM | POA: Diagnosis not present

## 2013-09-17 DIAGNOSIS — M1610 Unilateral primary osteoarthritis, unspecified hip: Secondary | ICD-10-CM | POA: Diagnosis not present

## 2013-09-17 DIAGNOSIS — M169 Osteoarthritis of hip, unspecified: Secondary | ICD-10-CM | POA: Diagnosis not present

## 2013-09-23 DIAGNOSIS — IMO0002 Reserved for concepts with insufficient information to code with codable children: Secondary | ICD-10-CM | POA: Diagnosis not present

## 2013-09-23 DIAGNOSIS — M48061 Spinal stenosis, lumbar region without neurogenic claudication: Secondary | ICD-10-CM | POA: Diagnosis not present

## 2013-09-23 DIAGNOSIS — M999 Biomechanical lesion, unspecified: Secondary | ICD-10-CM | POA: Diagnosis not present

## 2013-09-23 DIAGNOSIS — M545 Low back pain, unspecified: Secondary | ICD-10-CM | POA: Diagnosis not present

## 2013-09-25 DIAGNOSIS — M999 Biomechanical lesion, unspecified: Secondary | ICD-10-CM | POA: Diagnosis not present

## 2013-09-25 DIAGNOSIS — M48061 Spinal stenosis, lumbar region without neurogenic claudication: Secondary | ICD-10-CM | POA: Diagnosis not present

## 2013-09-25 DIAGNOSIS — M545 Low back pain, unspecified: Secondary | ICD-10-CM | POA: Diagnosis not present

## 2013-09-25 DIAGNOSIS — IMO0002 Reserved for concepts with insufficient information to code with codable children: Secondary | ICD-10-CM | POA: Diagnosis not present

## 2013-09-30 DIAGNOSIS — M48061 Spinal stenosis, lumbar region without neurogenic claudication: Secondary | ICD-10-CM | POA: Diagnosis not present

## 2013-09-30 DIAGNOSIS — M545 Low back pain, unspecified: Secondary | ICD-10-CM | POA: Diagnosis not present

## 2013-09-30 DIAGNOSIS — IMO0002 Reserved for concepts with insufficient information to code with codable children: Secondary | ICD-10-CM | POA: Diagnosis not present

## 2013-09-30 DIAGNOSIS — M999 Biomechanical lesion, unspecified: Secondary | ICD-10-CM | POA: Diagnosis not present

## 2013-10-02 ENCOUNTER — Other Ambulatory Visit: Payer: Self-pay | Admitting: *Deleted

## 2013-10-02 MED ORDER — SIMVASTATIN 40 MG PO TABS: 40.0000 mg | ORAL_TABLET | Freq: Every day | ORAL | Status: DC

## 2013-10-07 DIAGNOSIS — M545 Low back pain, unspecified: Secondary | ICD-10-CM | POA: Diagnosis not present

## 2013-10-07 DIAGNOSIS — M48061 Spinal stenosis, lumbar region without neurogenic claudication: Secondary | ICD-10-CM | POA: Diagnosis not present

## 2013-10-07 DIAGNOSIS — M999 Biomechanical lesion, unspecified: Secondary | ICD-10-CM | POA: Diagnosis not present

## 2013-10-07 DIAGNOSIS — IMO0002 Reserved for concepts with insufficient information to code with codable children: Secondary | ICD-10-CM | POA: Diagnosis not present

## 2013-10-08 NOTE — Telephone Encounter (Signed)
error 

## 2013-10-23 DIAGNOSIS — M545 Low back pain, unspecified: Secondary | ICD-10-CM | POA: Diagnosis not present

## 2013-10-23 DIAGNOSIS — M48061 Spinal stenosis, lumbar region without neurogenic claudication: Secondary | ICD-10-CM | POA: Diagnosis not present

## 2013-10-23 DIAGNOSIS — IMO0002 Reserved for concepts with insufficient information to code with codable children: Secondary | ICD-10-CM | POA: Diagnosis not present

## 2013-10-23 DIAGNOSIS — M999 Biomechanical lesion, unspecified: Secondary | ICD-10-CM | POA: Diagnosis not present

## 2013-10-30 ENCOUNTER — Other Ambulatory Visit: Payer: Self-pay | Admitting: Cardiology

## 2013-11-06 DIAGNOSIS — M545 Low back pain, unspecified: Secondary | ICD-10-CM | POA: Diagnosis not present

## 2013-11-06 DIAGNOSIS — IMO0002 Reserved for concepts with insufficient information to code with codable children: Secondary | ICD-10-CM | POA: Diagnosis not present

## 2013-11-06 DIAGNOSIS — M48061 Spinal stenosis, lumbar region without neurogenic claudication: Secondary | ICD-10-CM | POA: Diagnosis not present

## 2013-11-06 DIAGNOSIS — M999 Biomechanical lesion, unspecified: Secondary | ICD-10-CM | POA: Diagnosis not present

## 2013-11-26 ENCOUNTER — Other Ambulatory Visit: Payer: Self-pay | Admitting: Cardiology

## 2013-11-27 DIAGNOSIS — M5417 Radiculopathy, lumbosacral region: Secondary | ICD-10-CM | POA: Diagnosis not present

## 2013-11-27 DIAGNOSIS — M545 Low back pain: Secondary | ICD-10-CM | POA: Diagnosis not present

## 2013-11-27 DIAGNOSIS — M9903 Segmental and somatic dysfunction of lumbar region: Secondary | ICD-10-CM | POA: Diagnosis not present

## 2013-11-27 DIAGNOSIS — M4806 Spinal stenosis, lumbar region: Secondary | ICD-10-CM | POA: Diagnosis not present

## 2013-12-02 ENCOUNTER — Other Ambulatory Visit: Payer: Self-pay | Admitting: Cardiology

## 2013-12-16 ENCOUNTER — Ambulatory Visit: Payer: Self-pay | Admitting: Cardiology

## 2013-12-18 DIAGNOSIS — M545 Low back pain: Secondary | ICD-10-CM | POA: Diagnosis not present

## 2013-12-18 DIAGNOSIS — M9903 Segmental and somatic dysfunction of lumbar region: Secondary | ICD-10-CM | POA: Diagnosis not present

## 2013-12-18 DIAGNOSIS — M5417 Radiculopathy, lumbosacral region: Secondary | ICD-10-CM | POA: Diagnosis not present

## 2013-12-18 DIAGNOSIS — M4806 Spinal stenosis, lumbar region: Secondary | ICD-10-CM | POA: Diagnosis not present

## 2013-12-31 ENCOUNTER — Other Ambulatory Visit: Payer: Self-pay | Admitting: Cardiology

## 2014-01-01 DIAGNOSIS — M9903 Segmental and somatic dysfunction of lumbar region: Secondary | ICD-10-CM | POA: Diagnosis not present

## 2014-01-01 DIAGNOSIS — M4806 Spinal stenosis, lumbar region: Secondary | ICD-10-CM | POA: Diagnosis not present

## 2014-01-01 DIAGNOSIS — M5417 Radiculopathy, lumbosacral region: Secondary | ICD-10-CM | POA: Diagnosis not present

## 2014-01-01 DIAGNOSIS — M545 Low back pain: Secondary | ICD-10-CM | POA: Diagnosis not present

## 2014-01-26 ENCOUNTER — Ambulatory Visit (INDEPENDENT_AMBULATORY_CARE_PROVIDER_SITE_OTHER): Payer: Medicare Other | Admitting: Cardiology

## 2014-01-26 ENCOUNTER — Encounter: Payer: Self-pay | Admitting: Cardiology

## 2014-01-26 VITALS — BP 132/78 | HR 68 | Ht 65.0 in

## 2014-01-26 DIAGNOSIS — I480 Paroxysmal atrial fibrillation: Secondary | ICD-10-CM | POA: Diagnosis not present

## 2014-01-26 DIAGNOSIS — I10 Essential (primary) hypertension: Secondary | ICD-10-CM | POA: Diagnosis not present

## 2014-01-26 LAB — BASIC METABOLIC PANEL
BUN: 17 mg/dL (ref 6–23)
CALCIUM: 9.5 mg/dL (ref 8.4–10.5)
CHLORIDE: 103 meq/L (ref 96–112)
CO2: 26 meq/L (ref 19–32)
CREATININE: 0.7 mg/dL (ref 0.4–1.2)
GFR: 84.6 mL/min (ref >60.00)
Glucose, Bld: 296 mg/dL — ABNORMAL HIGH (ref 70–99)
Potassium: 4.4 mEq/L (ref 3.5–5.1)
Sodium: 135 mEq/L (ref 135–145)

## 2014-01-26 LAB — CBC WITH DIFFERENTIAL/PLATELET
BASOS PCT: 0.7 % (ref 0.0–3.0)
Basophils Absolute: 0 10*3/uL (ref 0.0–0.1)
EOS PCT: 2.7 % (ref 0.0–5.0)
Eosinophils Absolute: 0.2 10*3/uL (ref 0.0–0.7)
HCT: 36.5 % (ref 36.0–46.0)
Hemoglobin: 12 g/dL (ref 12.0–15.0)
LYMPHS PCT: 36.7 % (ref 12.0–46.0)
Lymphs Abs: 2.2 10*3/uL (ref 0.7–4.0)
MCHC: 32.7 g/dL (ref 30.0–36.0)
MCV: 86.9 fl (ref 78.0–100.0)
MONOS PCT: 9.2 % (ref 3.0–12.0)
Monocytes Absolute: 0.5 10*3/uL (ref 0.1–1.0)
NEUTROS PCT: 50.7 % (ref 43.0–77.0)
Neutro Abs: 3 10*3/uL (ref 1.4–7.7)
Platelets: 219 10*3/uL (ref 150.0–400.0)
RBC: 4.2 Mil/uL (ref 3.87–5.11)
RDW: 13 % (ref 11.5–15.5)
WBC: 5.9 10*3/uL (ref 4.0–10.5)

## 2014-01-26 MED ORDER — RIVAROXABAN 20 MG PO TABS: 20.0000 mg | ORAL_TABLET | Freq: Every day | ORAL | Status: DC

## 2014-01-26 NOTE — Patient Instructions (Signed)
Your physician has recommended you make the following change in your medication:  1) START XARELTO 20 mg daily 2) STOP ASPIRIN  Your physician recommends that you have lab work TODAY [facility])  Your physician wants you to follow-up in: 6 months with Dr. [name]. You will receive a reminder letter in the mail two months in advance. If you don't receive a letter, please call our office to schedule the follow-up appointment.

## 2014-01-26 NOTE — Progress Notes (Signed)
[place], [place], [place]  [address] Phone: [phone] Fax:  [phone]  Date:  01/26/2014   ID:  Latasha Hodges, DOB [birth year], MRN [id number]  PCP:  Osborne Casco, MD  Cardiologist:  Fransico Him, MD    History of Present Illness: Latasha Hodges is a 68 y.o. female with a history of PAF and HTN presents today for followup. She is doing well. SHe denies any chest pain, SOB, DOE, LE edema, dizziness or syncope. She occasionally has a skipped beat but no afib.   Wt Readings from Last 3 Encounters:  05/27/13 236 lb (107.049 kg)  10/04/10 245 lb (111.131 kg)     Past Medical History  Diagnosis Date  . Atrial fibrillation   . Diabetes mellitus   . Hyperlipidemia   . Obesity   . Bronchitis, chronic, simple   . Hypertension     Current Outpatient Prescriptions  Medication Sig Dispense Refill  . aspirin 81 MG tablet Take 81 mg by mouth daily.      . fenofibrate micronized (LOFIBRA) 200 MG capsule Take 1 capsule (200 mg total) by mouth daily before breakfast. 30 capsule 6  . flecainide (TAMBOCOR) 100 MG tablet Take one tablet twice a day 270 tablet 3  . ipratropium (ATROVENT HFA) 17 MCG/ACT inhaler Inhale 2 puffs into the lungs every 6 (six) hours.    Marland Kitchen ipratropium (ATROVENT) 0.02 % nebulizer solution Take 0.5 mg by nebulization every 4 (four) hours as needed for wheezing or shortness of breath.    . lisinopril (PRINIVIL,ZESTRIL) 20 MG tablet TAKE 1 TABLET BY MOUTH EVERY DAY 90 tablet 0  . metFORMIN (GLUCOPHAGE) 1000 MG tablet Take 1,000 mg by mouth 2 (two) times daily.  0  . metoprolol succinate (TOPROL-XL) 50 MG 24 hr tablet TAKE 1 TABLET BY MOUTH DAILY 90 tablet 0  . ONGLYZA 5 MG TABS tablet Take 5 mg by mouth daily.  2  . simvastatin (ZOCOR) 40 MG tablet TAKE 1 TABLET (40 MG TOTAL) BY MOUTH AT BEDTIME. 30 tablet 6   No current facility-administered medications for this visit.    Allergies:    Allergies  Allergen Reactions  .  Iodinated Diagnostic Agents Hives, Swelling and Rash  . Penicillins Rash    Childhood reaction.  . Sulfa Drugs Cross Reactors Rash    Childhood reaction.    Social History:  The patient  reports that she quit smoking about 32 years ago. She has never used smokeless tobacco. She reports that she does not drink alcohol or use illicit drugs.   Family History:  The patient's family history includes Cancer in her mother; Colonic polyp in her cousin; Diabetes in her father; Hypertension in her father. There is no history of Colon cancer.   ROS:  Please see the history of present illness.      All other systems reviewed and negative.   PHYSICAL EXAM: VS:  BP 132/78 mmHg  Pulse 68  Ht 5\' 5"  (1.651 m) Well nourished, well developed, in no acute distress HEENT: normal Neck: no JVD Cardiac:  normal S1, S2; RRR; no murmur Lungs:  clear to auscultation bilaterally, no wheezing, rhonchi or rales Abd: soft, nontender, no hepatomegaly Ext: no edema Skin: warm and dry Neuro:  CNs 2-12 intact, no focal abnormalities noted   ASSESSMENT AND PLAN:  1. PAF maintaining NSR - continue flecainide/metoprolol - She has a CHADS2VASC score of 4 so I have recommended stopping ASA and starting Xarelto 20mg  daily - I  will check a NOAC 2. HTN  controlled - continue metoprolol/Lisinopril  Followup with me in 6 months  Signed, Fransico Him, MD [facility] 01/26/2014 8:46 AM

## 2014-01-28 ENCOUNTER — Telehealth: Payer: Self-pay | Admitting: Cardiology

## 2014-01-28 NOTE — Telephone Encounter (Signed)
Patient informed of lab results and verbal understanding expressed.   

## 2014-01-28 NOTE — Telephone Encounter (Signed)
New message ° ° ° ° °Want lab results °

## 2014-01-29 ENCOUNTER — Telehealth: Payer: Self-pay | Admitting: Cardiology

## 2014-01-29 DIAGNOSIS — M5417 Radiculopathy, lumbosacral region: Secondary | ICD-10-CM | POA: Diagnosis not present

## 2014-01-29 DIAGNOSIS — M545 Low back pain: Secondary | ICD-10-CM | POA: Diagnosis not present

## 2014-01-29 DIAGNOSIS — M4806 Spinal stenosis, lumbar region: Secondary | ICD-10-CM | POA: Diagnosis not present

## 2014-01-29 DIAGNOSIS — M9903 Segmental and somatic dysfunction of lumbar region: Secondary | ICD-10-CM | POA: Diagnosis not present

## 2014-01-29 NOTE — Telephone Encounter (Signed)
Follow Up    Pt following up on call from earlier. States she was just started on Xarelto, calling to see if she is OK to have a glass of wine and currently at a holiday party at the moment. Please call.

## 2014-01-29 NOTE — Telephone Encounter (Signed)
Please let patient know that if alcohol can, though, trigger afib

## 2014-01-29 NOTE — Telephone Encounter (Signed)
New message     Pt is on xarelto.  She is at a holiday party and want to know if she can have wine.

## 2014-01-29 NOTE — Telephone Encounter (Signed)
A glass of wine is fine

## 2014-01-29 NOTE — Telephone Encounter (Signed)
Pt just started Xarelto medication. Pt would like to know if it is okay for her to have a glass of wine. Pt is currently in a holiday party. Pt was made aware that alcohol beverages tend  to thin the blood. Pt  is aware that this message will send to MD for recommendations.

## 2014-01-29 NOTE — Telephone Encounter (Signed)
Left pt a message to call back. 

## 2014-01-30 NOTE — Telephone Encounter (Signed)
Route to [employer]

## 2014-01-30 NOTE — Telephone Encounter (Signed)
Informed patient it is fine for her to have a glass of wine, but it can trigger a-fib. Instructed patient to limit her intake. Patient agrees with treatment plan.

## 2014-02-11 ENCOUNTER — Other Ambulatory Visit: Payer: Self-pay | Admitting: Cardiology

## 2014-02-11 MED ORDER — FENOFIBRATE MICRONIZED 200 MG PO CAPS: 200.0000 mg | ORAL_CAPSULE | Freq: Every day | ORAL | Status: DC

## 2014-02-19 ENCOUNTER — Telehealth: Payer: Self-pay | Admitting: *Deleted

## 2014-02-19 ENCOUNTER — Other Ambulatory Visit: Payer: Self-pay | Admitting: Cardiology

## 2014-02-19 NOTE — Telephone Encounter (Signed)
PA for Xarelto approved though 02/20/2015

## 2014-03-28 ENCOUNTER — Other Ambulatory Visit: Payer: Self-pay | Admitting: Cardiology

## 2014-03-30 NOTE — Telephone Encounter (Signed)
Patient needs appt for refills

## 2014-04-02 DIAGNOSIS — Z23 Encounter for immunization: Secondary | ICD-10-CM | POA: Diagnosis not present

## 2014-04-02 DIAGNOSIS — I4891 Unspecified atrial fibrillation: Secondary | ICD-10-CM | POA: Diagnosis not present

## 2014-04-02 DIAGNOSIS — E781 Pure hyperglyceridemia: Secondary | ICD-10-CM | POA: Diagnosis not present

## 2014-04-02 DIAGNOSIS — E1165 Type 2 diabetes mellitus with hyperglycemia: Secondary | ICD-10-CM | POA: Diagnosis not present

## 2014-04-02 DIAGNOSIS — E669 Obesity, unspecified: Secondary | ICD-10-CM | POA: Diagnosis not present

## 2014-04-02 DIAGNOSIS — I119 Hypertensive heart disease without heart failure: Secondary | ICD-10-CM | POA: Diagnosis not present

## 2014-04-02 DIAGNOSIS — E782 Mixed hyperlipidemia: Secondary | ICD-10-CM | POA: Diagnosis not present

## 2014-04-23 ENCOUNTER — Encounter: Payer: Self-pay | Admitting: Cardiology

## 2014-05-05 ENCOUNTER — Other Ambulatory Visit: Payer: Self-pay | Admitting: Cardiology

## 2014-05-07 ENCOUNTER — Other Ambulatory Visit: Payer: Self-pay

## 2014-05-07 MED ORDER — FLECAINIDE ACETATE 100 MG PO TABS: ORAL_TABLET | ORAL | Status: DC

## 2014-05-07 MED ORDER — LISINOPRIL 20 MG PO TABS: 20.0000 mg | ORAL_TABLET | Freq: Every day | ORAL | Status: DC

## 2014-05-23 ENCOUNTER — Other Ambulatory Visit: Payer: Self-pay | Admitting: Cardiology

## 2014-06-01 ENCOUNTER — Telehealth: Payer: Self-pay | Admitting: Cardiology

## 2014-06-01 NOTE — Telephone Encounter (Signed)
Left message for patient that per Dr. [name], she may take Benadryl. Instructed her to see her PCP. Encouraged her to call the office if she has any questions or concerns.

## 2014-06-01 NOTE — Telephone Encounter (Signed)
Patient st she started itching 2 weeks ago all over her trunk, arms and back. She currently has a rash that is raised and itchy, but not red.  She said the only thing that was new was her body wash, and she bought her old kind again today. She also said [facility] and her simvastatin was a different shape. When she received her refill today, it is back to the [facility] at [facility]. [facility] pharmacist instructed patient to check with Dr. [name] before taking Benadryl.  Patient st she has no other symptoms other than the itching.  To Dr. [name].

## 2014-06-01 NOTE — Telephone Encounter (Signed)
New message         Pt is having an allergic reaction   Pharmacy wants pt to take benadryl   is this ok?

## 2014-06-01 NOTE — Telephone Encounter (Signed)
Ok to take benadryl but would see her PCP

## 2014-06-02 DIAGNOSIS — R21 Rash and other nonspecific skin eruption: Secondary | ICD-10-CM | POA: Diagnosis not present

## 2014-06-02 DIAGNOSIS — T7840XA Allergy, unspecified, initial encounter: Secondary | ICD-10-CM | POA: Diagnosis not present

## 2014-07-28 ENCOUNTER — Telehealth: Payer: Self-pay | Admitting: Cardiology

## 2014-07-28 NOTE — Telephone Encounter (Signed)
Pt states that she went to pick up Xarelto last night and it was going to cost her $175. Pt spoke with insurance company and they said that she has "reached her limit" and now would have to pay 45% of cost for her medications. Pt states that she only has one Xarelto left and is going out of town on Friday. Informed pt that I have placed samples at the front for her to pick up. Pt also states that insurance company says that doing a prior authorization will not help get cost down. Pt plans to speak with Dr. [name] about this at appt on 6/27. Will forward to Dr. [name] and her nurse Valetta Fuller for review and advisement.

## 2014-07-28 NOTE — Telephone Encounter (Signed)
Left message to call back  

## 2014-07-28 NOTE — Telephone Encounter (Signed)
New Message     Pt c/o medication issue:  1. Name of Medication: Zeletta  2. How are you currently taking this medication (dosage and times per day)? 1 x a day / PT unsure of the mg  3. Are you having a reaction (difficulty breathing--STAT)? No  4. What is your medication issue? Went from  $35.00 to $175.00 a month/ Patient only has 1 pill left and she don't want to have a stroke

## 2014-07-28 NOTE — Telephone Encounter (Signed)
We can supply her with samples until out of the donut hole

## 2014-07-30 DIAGNOSIS — E782 Mixed hyperlipidemia: Secondary | ICD-10-CM | POA: Diagnosis not present

## 2014-07-30 DIAGNOSIS — E1165 Type 2 diabetes mellitus with hyperglycemia: Secondary | ICD-10-CM | POA: Diagnosis not present

## 2014-07-30 DIAGNOSIS — I119 Hypertensive heart disease without heart failure: Secondary | ICD-10-CM | POA: Diagnosis not present

## 2014-07-30 DIAGNOSIS — Z1389 Encounter for screening for other disorder: Secondary | ICD-10-CM | POA: Diagnosis not present

## 2014-07-30 DIAGNOSIS — I4891 Unspecified atrial fibrillation: Secondary | ICD-10-CM | POA: Diagnosis not present

## 2014-08-05 ENCOUNTER — Other Ambulatory Visit: Payer: Self-pay | Admitting: Cardiology

## 2014-08-10 ENCOUNTER — Ambulatory Visit (INDEPENDENT_AMBULATORY_CARE_PROVIDER_SITE_OTHER): Payer: Medicare Other | Admitting: Cardiology

## 2014-08-10 ENCOUNTER — Encounter: Payer: Self-pay | Admitting: Cardiology

## 2014-08-10 VITALS — BP 128/60 | HR 66 | Ht 65.0 in | Wt 221.4 lb

## 2014-08-10 DIAGNOSIS — I480 Paroxysmal atrial fibrillation: Secondary | ICD-10-CM

## 2014-08-10 DIAGNOSIS — E669 Obesity, unspecified: Secondary | ICD-10-CM

## 2014-08-10 DIAGNOSIS — I10 Essential (primary) hypertension: Secondary | ICD-10-CM | POA: Diagnosis not present

## 2014-08-10 NOTE — Progress Notes (Signed)
Cardiology Office Note   Date:  08/10/2014   ID:  Latasha Hodges, DOB 05/14/1945, MRN [id number]  PCP:  Osborne Casco, MD    Chief Complaint  Patient presents with  . Follow-up    Paroxysmal atrial fib      History of Present Illness: Latasha Hodges is a 69 y.o. female with a history of PAF and HTN presents today for followup. She is doing well. SHe denies any chest pain, SOB, DOE, LE edema, dizziness or syncope. She occasionally has a skipped beat but no afib.  She has been doing weight watchers and has lost 22 lbs.      Past Medical History  Diagnosis Date  . Atrial fibrillation   . Diabetes mellitus   . Hyperlipidemia   . Obesity   . Bronchitis, chronic, simple   . Hypertension     Past Surgical History  Procedure Laterality Date  . Tubal ligation  1980  . Colonoscopy  12/29/2010    Procedure: COLONOSCOPY;  Surgeon: Owens Loffler, MD;  Location: [facility];  Service: Endoscopy;  Laterality: N/A;     Current Outpatient Prescriptions  Medication Sig Dispense Refill  . flecainide (TAMBOCOR) 100 MG tablet Take one tablet twice a day 270 tablet 0  . ipratropium (ATROVENT HFA) 17 MCG/ACT inhaler Inhale 2 puffs into the lungs every 6 (six) hours.    Marland Kitchen ipratropium (ATROVENT) 0.02 % nebulizer solution Take 0.5 mg by nebulization every 4 (four) hours as needed for wheezing or shortness of breath.    . lisinopril (PRINIVIL,ZESTRIL) 20 MG tablet TAKE 1 TABLET(20 MG) BY MOUTH DAILY 90 tablet 0  . metFORMIN (GLUCOPHAGE) 1000 MG tablet Take 1,000 mg by mouth 2 (two) times daily.  0  . metoprolol succinate (TOPROL-XL) 50 MG 24 hr tablet TAKE 1 TABLET BY MOUTH EVERY DAY 30 tablet 3  . ONGLYZA 5 MG TABS tablet Take 5 mg by mouth daily.  2  . XARELTO 20 MG TABS tablet TAKE 1 TABLET BY MOUTH DAILY WITH SUPPER 30 tablet 5  . fenofibrate micronized (LOFIBRA) 200 MG capsule Take 1 capsule (200 mg total) by mouth daily before breakfast. (Patient  not taking: Reported on 08/10/2014) 30 capsule 6  . simvastatin (ZOCOR) 40 MG tablet TAKE 1 TABLET (40 MG TOTAL) BY MOUTH AT BEDTIME. (Patient not taking: Reported on 08/10/2014) 30 tablet 6   No current facility-administered medications for this visit.    Allergies:   Iodinated diagnostic agents; Penicillins; and Sulfa drugs cross reactors    Social History:  The patient  reports that she quit smoking about 32 years ago. She has never used smokeless tobacco. She reports that she does not drink alcohol or use illicit drugs.   Family History:  The patient's family history includes Cancer in her mother; Colonic polyp in her cousin; Diabetes in her father; Hypertension in her father. There is no history of Colon cancer.    ROS:  Please see the history of present illness.   Otherwise, review of systems are positive for none.   All other systems are reviewed and negative.    PHYSICAL EXAM: VS:  BP 128/60 mmHg  Pulse 66  Ht 5\' 5"  (1.651 m)  Wt 221 lb 6.4 oz (100.426 kg)  BMI 36.84 kg/m2 , BMI Body mass index is 36.84 kg/(m^2). GEN: Well nourished, well developed, in no acute distress HEENT: normal Neck: no JVD, carotid bruits,  or masses Cardiac: RRR; no murmurs, rubs, or gallops,no edema  Respiratory:  clear to auscultation bilaterally, normal work of breathing GI: soft, nontender, nondistended, + BS MS: no deformity or atrophy Skin: warm and dry, no rash Neuro:  Strength and sensation are intact Psych: euthymic mood, full affect   EKG:  EKG was ordered today and showed NSR with no ST changes    Recent Labs: 01/26/2014: BUN 17; Creatinine, Ser 0.7; Hemoglobin 12.0; Platelets 219.0; Potassium 4.4; Sodium 135    Lipid Panel No results found for: CHOL, TRIG, HDL, CHOLHDL, VLDL, LDLCALC, LDLDIRECT    Wt Readings from Last 3 Encounters:  08/10/14 221 lb 6.4 oz (100.426 kg)  05/27/13 236 lb (107.049 kg)  10/04/10 245 lb (111.131 kg)    ASSESSMENT AND PLAN:  1. PAF  maintaining NSR - continue flecainide/metoprolol - She has a CHADS2VASC score of 4 - continue Xarelto - I will check a NOAC 2. HTN controlled - continue metoprolol/Lisinopril    Current medicines are reviewed at length with the patient today.  The patient does not have concerns regarding medicines.  The following changes have been made:  no change  Labs/ tests ordered today: See above Assessment and Plan No orders of the defined types were placed in this encounter.     Disposition:   FU with me in 6 months  Signed, Sueanne Margarita, MD  08/10/2014 3:39 PM    [facility] [place], [place], [place]  [address] Phone: [phone]; Fax: [phone]

## 2014-08-10 NOTE — Patient Instructions (Signed)
Medication Instructions:  Your physician recommends that you continue on your current medications as directed. Please refer to the Current Medication list given to you today.   Labwork: TODAY: BMET, CBC  Testing/Procedures: None  Follow-Up: Your physician wants you to follow-up in: 6 months with Dr. [name]. You will receive a reminder letter in the mail two months in advance. If you don't receive a letter, please call our office to schedule the follow-up appointment.   Any Other Special Instructions Will Be Listed Below (If Applicable).

## 2014-08-11 ENCOUNTER — Telehealth: Payer: Self-pay

## 2014-08-11 DIAGNOSIS — E785 Hyperlipidemia, unspecified: Secondary | ICD-10-CM

## 2014-08-11 LAB — BASIC METABOLIC PANEL
BUN: 21 mg/dL (ref 6–23)
CALCIUM: 9.2 mg/dL (ref 8.4–10.5)
CO2: 22 mEq/L (ref 19–32)
Chloride: 106 mEq/L (ref 96–112)
Creatinine, Ser: 0.57 mg/dL (ref 0.40–1.20)
GFR: 112.37 mL/min (ref >60.00)
Glucose, Bld: 124 mg/dL — ABNORMAL HIGH (ref 70–99)
Potassium: 4.2 mEq/L (ref 3.5–5.1)
Sodium: 139 mEq/L (ref 135–145)

## 2014-08-11 LAB — CBC WITH DIFFERENTIAL/PLATELET
Basophils Absolute: 0 10*3/uL (ref 0.0–0.1)
Basophils Relative: 0.4 % (ref 0.0–3.0)
Eosinophils Absolute: 0.2 10*3/uL (ref 0.0–0.7)
Eosinophils Relative: 3 % (ref 0.0–5.0)
HCT: 34.5 % — ABNORMAL LOW (ref 36.0–46.0)
HEMOGLOBIN: 11.7 g/dL — AB (ref 12.0–15.0)
Lymphocytes Relative: 32.5 % (ref 12.0–46.0)
Lymphs Abs: 2.4 10*3/uL (ref 0.7–4.0)
MCHC: 34 g/dL (ref 30.0–36.0)
MCV: 84.9 fl (ref 78.0–100.0)
MONO ABS: 0.6 10*3/uL (ref 0.1–1.0)
Monocytes Relative: 7.6 % (ref 3.0–12.0)
Neutro Abs: 4.1 10*3/uL (ref 1.4–7.7)
Neutrophils Relative %: 56.5 % (ref 43.0–77.0)
Platelets: 248 10*3/uL (ref 150.0–400.0)
RBC: 4.07 Mil/uL (ref 3.87–5.11)
RDW: 13.8 % (ref 11.5–15.5)
WBC: 7.3 10*3/uL (ref 4.0–10.5)

## 2014-08-11 NOTE — Telephone Encounter (Signed)
Patient to come in tomorrow for fasting lab work.

## 2014-08-11 NOTE — Telephone Encounter (Signed)
-----   Message from Sueanne Margarita, MD sent at 08/11/2014  8:25 AM EDT ----- Please repeat FLP and ALT fasting

## 2014-08-12 ENCOUNTER — Other Ambulatory Visit (INDEPENDENT_AMBULATORY_CARE_PROVIDER_SITE_OTHER): Payer: Medicare Other | Admitting: *Deleted

## 2014-08-12 DIAGNOSIS — E785 Hyperlipidemia, unspecified: Secondary | ICD-10-CM

## 2014-08-12 LAB — LDL CHOLESTEROL, DIRECT: Direct LDL: 69 mg/dL

## 2014-08-12 LAB — LIPID PANEL
CHOLESTEROL: 180 mg/dL (ref 0–200)
HDL: 34.2 mg/dL — ABNORMAL LOW (ref >39.00)
Total CHOL/HDL Ratio: 5

## 2014-08-12 LAB — ALT: ALT: 14 U/L (ref 0–35)

## 2014-08-20 ENCOUNTER — Other Ambulatory Visit: Payer: Self-pay | Admitting: *Deleted

## 2014-08-20 MED ORDER — FENOFIBRATE MICRONIZED 200 MG PO CAPS: 200.0000 mg | ORAL_CAPSULE | Freq: Every day | ORAL | Status: DC

## 2014-08-20 MED ORDER — SIMVASTATIN 40 MG PO TABS: ORAL_TABLET | ORAL | Status: DC

## 2014-09-08 ENCOUNTER — Telehealth: Payer: Self-pay | Admitting: Cardiology

## 2014-09-08 NOTE — Telephone Encounter (Signed)
New message     Pt is needing sample of xarelto 20mg . Informed pt that we did not have samples for xarelto 20mg  Pt states she has 4 pills left and pt is leaving Thursday for out of town. Pt is in doughtnut hole right now and cannot afford to pay for the xarelto. Please call to discuss options for the xarelto.

## 2014-09-08 NOTE — Telephone Encounter (Signed)
Any recommendations?

## 2014-09-09 NOTE — Telephone Encounter (Signed)
Left message for patient that samples have been left for her to pick up for vacation, but to call back to discuss alternatives when she returns.

## 2014-09-09 NOTE — Telephone Encounter (Signed)
I have a few samples I can supply her but would suggest changing to warfarin after her vacation.  If she is already in the donut hole, we cannot guarantee samples for another 5 months until the end of the year and this will only occur again next year.

## 2014-09-25 ENCOUNTER — Other Ambulatory Visit: Payer: Self-pay | Admitting: Cardiology

## 2014-09-28 ENCOUNTER — Telehealth: Payer: Self-pay | Admitting: Cardiology

## 2014-09-28 NOTE — Telephone Encounter (Signed)
New message     Pt is on xarelto and cannot afford $173.00 per month.  Pt filled out paperwork for assistance and she states she does not qualify Pt needs to be switched to something cheaper  Please call to discuss

## 2014-09-28 NOTE — Telephone Encounter (Signed)
LMTCB

## 2014-09-29 NOTE — Telephone Encounter (Signed)
LMTCB

## 2014-09-30 ENCOUNTER — Telehealth: Payer: Self-pay | Admitting: *Deleted

## 2014-09-30 NOTE — Telephone Encounter (Signed)
Patient called for xarelto samples. After seeing previous patient calls, I was wanting to be sure okay to give. Please advise. Thanks, MI

## 2014-09-30 NOTE — Telephone Encounter (Signed)
Left message to call back  

## 2014-09-30 NOTE — Telephone Encounter (Signed)
Pt was informed on 7/27 that she would have to discuss alternatives such as changing to warfarin.  Triage has attempted to call patient for 2 days.  Would have triage contact patient as we will need to have a plan for changing therapy.  Would suggest sending in Rx for warfarin 5mg  daily.  Have her take Xarelto and warfarin x 3 days then stop Xarelto.  Will need INR check in 5-7 days.

## 2014-10-01 NOTE — Telephone Encounter (Signed)
Patient returned call. Informed patient about Glean Salen note. Patient does not want to start Warfarin without talking to Dr. [name].  Patient stating she only has three more days worth of Xarelto at this time. Will call patient back.  Would have patient talk to [facility] or Dr. [name], but they are not in the office at this time. Left message for patient to call back. Only safe solution at this point is to give patient one sample bottle of 5 pills to last her until she can talk to Dr. [name] or Elberta Leatherwood the pharmacist about switching to warfarin next week. When patient calls back will let her know samples are at the front desk.  Called patient on mobile phone and was able to explain the situation. Patient aware that samples are at the front desk, that can last her until Dr. [name] can call her back next week. Patient is going to do some research of other alternative medications to see if there is something else besides Warfarin she can take. Informed patient that I would forward this message to Dr. [name] and her nurse, so they can call her next week to come to solution together.

## 2014-10-01 NOTE — Telephone Encounter (Signed)
There is no other option other than warfarin

## 2014-10-01 NOTE — Telephone Encounter (Signed)
See phone note from 09/30/14.

## 2014-10-02 ENCOUNTER — Other Ambulatory Visit: Payer: Self-pay | Admitting: Pharmacist

## 2014-10-02 MED ORDER — APIXABAN 5 MG PO TABS: 5.0000 mg | ORAL_TABLET | Freq: Two times a day (BID) | ORAL | Status: DC

## 2014-10-02 NOTE — Telephone Encounter (Signed)
Called patient and informed her of Dr. Theodosia Blender response. Patient wants to know about Eliquis. Patient stated she has talked to her pharmacist at [facility] and her insurance company and they suggested Eliquis. Informed patient that message would be sent to Dr. [name].

## 2014-10-02 NOTE — Telephone Encounter (Signed)
Will forward to Virgie.

## 2014-10-02 NOTE — Telephone Encounter (Signed)
OK with Eliquis if cost is no prohibitive please defer to coumadin clinic to see what cost would be

## 2014-10-02 NOTE — Telephone Encounter (Signed)
Called pt - she reports that Xarelto used to be $35 a month but has increased in price to ~$150 per month since she's in the donut hole. Per her insurance company, Eliquis is cheaper at ~$100 per month. Patient had a lot of questions regarding difference in efficacy or side effects with Xarelto and Eliquis. Discussed medications in detail with patient, that their efficacy is similar, and that the only major difference is twice daily dosing with Eliquis. Patient would like to make the switch to Eliquis - counseled patient over the phone and sent in Rx for 5mg  BID (age < 55, wt > 60kg, SCr < 1.5). Patient will finish her Xarelto samples this weekend and take her last dose Sunday with supper. Instructed her to start taking Eliquis 5mg  Monday PM and then start twice daily dosing on Tuesday. Patient understands and is in agreement with plan.

## 2014-10-05 ENCOUNTER — Telehealth: Payer: Self-pay

## 2014-10-05 NOTE — Telephone Encounter (Signed)
Prior auth for Eliquis 5mg  sent to [facility] via Cover my Meds.

## 2014-10-05 NOTE — Telephone Encounter (Signed)
Eliquis 5 mg approved by [employer]. Good for 1 year, through 10/05/2015. [id number]. Pharmacy notified.

## 2014-10-20 ENCOUNTER — Other Ambulatory Visit: Payer: Self-pay | Admitting: Cardiology

## 2014-11-05 ENCOUNTER — Other Ambulatory Visit: Payer: Self-pay | Admitting: Cardiology

## 2014-11-08 DIAGNOSIS — J069 Acute upper respiratory infection, unspecified: Secondary | ICD-10-CM | POA: Diagnosis not present

## 2014-11-13 ENCOUNTER — Telehealth: Payer: Self-pay | Admitting: Cardiology

## 2014-11-13 MED ORDER — WARFARIN SODIUM 5 MG PO TABS: 5.0000 mg | ORAL_TABLET | Freq: Every day | ORAL | Status: DC

## 2014-11-13 NOTE — Telephone Encounter (Signed)
Spoke with pt.  Explained Plavix would not be appropriate for treating her atrial fibrillation.  She is aware Coumadin is her only other option.  She is willing to try this.  Will send in 5mg  tablet.  She is aware to overlap Xarelto and Coumadin x 3 days.  New Coumadin appt made for 10/7

## 2014-11-13 NOTE — Telephone Encounter (Signed)
Patient never started Eliquis as directed (per 8/17 phone note). After PA competed, Eliquis was the same price as Xarelto so the patient continued Xarelto. She does not qualify for patient assistance programs. She st she has about 5 days left and can afford about 2 more weeks after that. Now, the patient is requesting to change to Plavix.  Informed the patient that her only alternative may be Coumadin, but I will ask Dr. [name] and Gay Filler, [facility] for recommendations.

## 2014-11-13 NOTE — Telephone Encounter (Signed)
New Message  Pt calling to speak w/ Katie concerning cheaper alternatives to Xarelto. Please call back and discuss.

## 2014-11-20 ENCOUNTER — Ambulatory Visit (INDEPENDENT_AMBULATORY_CARE_PROVIDER_SITE_OTHER): Payer: Medicare Other | Admitting: *Deleted

## 2014-11-20 DIAGNOSIS — Z7189 Other specified counseling: Secondary | ICD-10-CM | POA: Insufficient documentation

## 2014-11-20 DIAGNOSIS — I480 Paroxysmal atrial fibrillation: Secondary | ICD-10-CM | POA: Diagnosis not present

## 2014-11-20 DIAGNOSIS — Z5181 Encounter for therapeutic drug level monitoring: Secondary | ICD-10-CM | POA: Diagnosis not present

## 2014-11-20 LAB — POCT INR: INR: 1.8

## 2014-11-20 NOTE — Patient Instructions (Signed)

## 2014-11-27 ENCOUNTER — Ambulatory Visit (INDEPENDENT_AMBULATORY_CARE_PROVIDER_SITE_OTHER): Payer: Medicare Other | Admitting: Pharmacist

## 2014-11-27 DIAGNOSIS — I480 Paroxysmal atrial fibrillation: Secondary | ICD-10-CM

## 2014-11-27 DIAGNOSIS — Z5181 Encounter for therapeutic drug level monitoring: Secondary | ICD-10-CM | POA: Diagnosis not present

## 2014-11-27 LAB — POCT INR: INR: 3.2

## 2014-12-04 ENCOUNTER — Ambulatory Visit (INDEPENDENT_AMBULATORY_CARE_PROVIDER_SITE_OTHER): Payer: Medicare Other | Admitting: Pharmacist

## 2014-12-04 DIAGNOSIS — I480 Paroxysmal atrial fibrillation: Secondary | ICD-10-CM | POA: Diagnosis not present

## 2014-12-04 DIAGNOSIS — Z5181 Encounter for therapeutic drug level monitoring: Secondary | ICD-10-CM | POA: Diagnosis not present

## 2014-12-04 LAB — POCT INR: INR: 1.5

## 2014-12-15 ENCOUNTER — Ambulatory Visit (INDEPENDENT_AMBULATORY_CARE_PROVIDER_SITE_OTHER): Payer: Medicare Other | Admitting: *Deleted

## 2014-12-15 DIAGNOSIS — Z5181 Encounter for therapeutic drug level monitoring: Secondary | ICD-10-CM

## 2014-12-15 DIAGNOSIS — I480 Paroxysmal atrial fibrillation: Secondary | ICD-10-CM | POA: Diagnosis not present

## 2014-12-15 LAB — POCT INR: INR: 2.7

## 2014-12-22 ENCOUNTER — Ambulatory Visit (INDEPENDENT_AMBULATORY_CARE_PROVIDER_SITE_OTHER): Payer: Medicare Other | Admitting: *Deleted

## 2014-12-22 DIAGNOSIS — I480 Paroxysmal atrial fibrillation: Secondary | ICD-10-CM

## 2014-12-22 DIAGNOSIS — Z5181 Encounter for therapeutic drug level monitoring: Secondary | ICD-10-CM | POA: Diagnosis not present

## 2014-12-22 LAB — POCT INR: INR: 2.5

## 2015-01-01 ENCOUNTER — Ambulatory Visit (INDEPENDENT_AMBULATORY_CARE_PROVIDER_SITE_OTHER): Payer: Medicare Other

## 2015-01-01 DIAGNOSIS — I480 Paroxysmal atrial fibrillation: Secondary | ICD-10-CM | POA: Diagnosis not present

## 2015-01-01 DIAGNOSIS — Z5181 Encounter for therapeutic drug level monitoring: Secondary | ICD-10-CM | POA: Diagnosis not present

## 2015-01-01 LAB — POCT INR: INR: 2.9

## 2015-01-04 ENCOUNTER — Encounter: Payer: Self-pay | Admitting: Cardiology

## 2015-01-11 ENCOUNTER — Other Ambulatory Visit: Payer: Self-pay | Admitting: Cardiology

## 2015-01-14 ENCOUNTER — Ambulatory Visit (INDEPENDENT_AMBULATORY_CARE_PROVIDER_SITE_OTHER): Payer: Medicare Other | Admitting: *Deleted

## 2015-01-14 DIAGNOSIS — I480 Paroxysmal atrial fibrillation: Secondary | ICD-10-CM

## 2015-01-14 DIAGNOSIS — Z5181 Encounter for therapeutic drug level monitoring: Secondary | ICD-10-CM

## 2015-01-14 LAB — POCT INR: INR: 3.5

## 2015-01-28 ENCOUNTER — Ambulatory Visit (INDEPENDENT_AMBULATORY_CARE_PROVIDER_SITE_OTHER): Payer: Medicare Other | Admitting: *Deleted

## 2015-01-28 DIAGNOSIS — I480 Paroxysmal atrial fibrillation: Secondary | ICD-10-CM | POA: Diagnosis not present

## 2015-01-28 DIAGNOSIS — Z5181 Encounter for therapeutic drug level monitoring: Secondary | ICD-10-CM

## 2015-01-28 LAB — POCT INR: INR: 3.2

## 2015-02-02 ENCOUNTER — Other Ambulatory Visit: Payer: Self-pay | Admitting: Cardiology

## 2015-02-04 ENCOUNTER — Ambulatory Visit (INDEPENDENT_AMBULATORY_CARE_PROVIDER_SITE_OTHER): Payer: Medicare Other | Admitting: Pharmacist

## 2015-02-04 DIAGNOSIS — Z5181 Encounter for therapeutic drug level monitoring: Secondary | ICD-10-CM | POA: Diagnosis not present

## 2015-02-04 DIAGNOSIS — I480 Paroxysmal atrial fibrillation: Secondary | ICD-10-CM

## 2015-02-04 LAB — POCT INR: INR: 2.2

## 2015-02-13 DIAGNOSIS — J069 Acute upper respiratory infection, unspecified: Secondary | ICD-10-CM | POA: Diagnosis not present

## 2015-02-19 ENCOUNTER — Ambulatory Visit (INDEPENDENT_AMBULATORY_CARE_PROVIDER_SITE_OTHER): Payer: Medicare HMO

## 2015-02-19 DIAGNOSIS — I480 Paroxysmal atrial fibrillation: Secondary | ICD-10-CM

## 2015-02-19 DIAGNOSIS — Z5181 Encounter for therapeutic drug level monitoring: Secondary | ICD-10-CM | POA: Diagnosis not present

## 2015-02-19 LAB — POCT INR: INR: 3.2

## 2015-03-05 ENCOUNTER — Ambulatory Visit: Payer: Self-pay | Admitting: Cardiology

## 2015-03-05 ENCOUNTER — Ambulatory Visit (INDEPENDENT_AMBULATORY_CARE_PROVIDER_SITE_OTHER): Payer: Medicare HMO | Admitting: *Deleted

## 2015-03-05 DIAGNOSIS — I480 Paroxysmal atrial fibrillation: Secondary | ICD-10-CM | POA: Diagnosis not present

## 2015-03-05 DIAGNOSIS — Z5181 Encounter for therapeutic drug level monitoring: Secondary | ICD-10-CM | POA: Diagnosis not present

## 2015-03-05 LAB — POCT INR: INR: 2.8

## 2015-03-15 ENCOUNTER — Other Ambulatory Visit: Payer: Self-pay | Admitting: Cardiology

## 2015-03-19 ENCOUNTER — Other Ambulatory Visit: Payer: Self-pay | Admitting: Cardiology

## 2015-03-26 ENCOUNTER — Ambulatory Visit (INDEPENDENT_AMBULATORY_CARE_PROVIDER_SITE_OTHER): Payer: Medicare HMO | Admitting: *Deleted

## 2015-03-26 DIAGNOSIS — Z5181 Encounter for therapeutic drug level monitoring: Secondary | ICD-10-CM

## 2015-03-26 DIAGNOSIS — I480 Paroxysmal atrial fibrillation: Secondary | ICD-10-CM

## 2015-03-26 LAB — POCT INR: INR: 2

## 2015-04-12 NOTE — Progress Notes (Signed)
Cardiology Office Note   Date:  04/13/2015   ID:  Latasha Hodges, DOB [birth year], MRN [id number]  PCP:  Osborne Casco, MD    Chief Complaint  Patient presents with  . Atrial Fibrillation  . Hypertension      History of Present Illness: Latasha Hodges is a 70 y.o. female with a history of PAF and HTN presents today for followup. She is doing well. SHe denies any chest pain, SOB, DOE, LE edema, dizziness or syncope. She occasionally has a skipped beat but no afib.    Past Medical History  Diagnosis Date  . Atrial fibrillation ([place])   . Diabetes mellitus   . Hyperlipidemia   . Obesity   . Bronchitis, chronic, simple ([place])   . Hypertension     Past Surgical History  Procedure Laterality Date  . Tubal ligation  1980  . Colonoscopy  12/29/2010    Procedure: COLONOSCOPY;  Surgeon: Owens Loffler, MD;  Location: [facility];  Service: Endoscopy;  Laterality: N/A;     Current Outpatient Prescriptions  Medication Sig Dispense Refill  . fenofibrate micronized (LOFIBRA) 200 MG capsule TAKE 1 CAPSULE BY MOUTH EVERY MORNING BEFORE BREAKFAST 30 capsule 6  . flecainide (TAMBOCOR) 100 MG tablet TAKE 1 TABLET BY MOUTH TWICE DAILY 270 tablet 0  . lisinopril (PRINIVIL,ZESTRIL) 20 MG tablet TAKE 1 TABLET BY MOUTH EVERY DAY 90 tablet 0  . metFORMIN (GLUCOPHAGE) 1000 MG tablet Take 1,000 mg by mouth 2 (two) times daily.  0  . metoprolol succinate (TOPROL-XL) 50 MG 24 hr tablet TAKE 1 TABLET BY MOUTH EVERY DAY 90 tablet 0  . ONGLYZA 5 MG TABS tablet Take 5 mg by mouth daily.  2  . simvastatin (ZOCOR) 40 MG tablet TAKE 1 TABLET(40 MG) BY MOUTH AT BEDTIME 30 tablet 3  . warfarin (COUMADIN) 5 MG tablet TAKE 1 TABLET(5 MG) BY MOUTH DAILY 30 tablet 3   No current facility-administered medications for this visit.    Allergies:   Iodinated diagnostic agents; Penicillins; and Sulfa drugs cross reactors    Social History:  The patient  reports that she  quit smoking about 33 years ago. She has never used smokeless tobacco. She reports that she does not drink alcohol or use illicit drugs.   Family History:  The patient's family history includes Cancer in her mother; Colonic polyp in her cousin; Diabetes in her father; Hypertension in her father. There is no history of Colon cancer.    ROS:  Please see the history of present illness.   Otherwise, review of systems are positive for none.   All other systems are reviewed and negative.    PHYSICAL EXAM: VS:  BP 130/62 mmHg  Pulse 66  Ht 5' 4.5" (1.638 m)  Wt 237 lb (107.502 kg)  BMI 40.07 kg/m2 , BMI Body mass index is 40.07 kg/(m^2). GEN: Well nourished, well developed, in no acute distress HEENT: normal Neck: no JVD, carotid bruits, or masses Cardiac: RRR; no murmurs, rubs, or gallops,no edema  Respiratory:  clear to auscultation bilaterally, normal work of breathing GI: soft, nontender, nondistended, + BS MS: no deformity or atrophy Skin: warm and dry, no rash Neuro:  Strength and sensation are intact Psych: euthymic mood, full affect   EKG:  EKG is not ordered today.    Recent Labs: 08/10/2014: BUN 21; Creatinine, Ser 0.57; Hemoglobin 11.7*; Platelets 248.0; Potassium 4.2; Sodium 139 08/12/2014:  ALT 14    Lipid Panel    Component Value Date/Time   CHOL 180 08/12/2014 0805   TRIG * 08/12/2014 0805    533.0 Triglyceride is over 400; calculations on Lipids are invalid.   HDL 34.20* 08/12/2014 0805   CHOLHDL 5 08/12/2014 0805   LDLDIRECT 69.0 08/12/2014 0805      Wt Readings from Last 3 Encounters:  04/13/15 237 lb (107.502 kg)  08/10/14 221 lb 6.4 oz (100.426 kg)  05/27/13 236 lb (107.049 kg)    ASSESSMENT AND PLAN:  1. PAF maintaining NSR - continue flecainide/metoprolol - She has a CHADS2VASC score of 4 - continue Warfarin 2. HTN controlled - continue metoprolol/Lisinopril    Current medicines are reviewed at length with the patient today.  The patient  does not have concerns regarding medicines.  The following changes have been made:  no change  Labs/ tests ordered today: See above Assessment and Plan No orders of the defined types were placed in this encounter.     Disposition:   FU with me in 6 months  Signed, Sueanne Margarita, MD  04/13/2015 1:46 PM    [facility], [place], [place]  [address] Phone: [phone]; Fax: [phone]

## 2015-04-13 ENCOUNTER — Ambulatory Visit (INDEPENDENT_AMBULATORY_CARE_PROVIDER_SITE_OTHER): Payer: Medicare HMO

## 2015-04-13 ENCOUNTER — Ambulatory Visit (INDEPENDENT_AMBULATORY_CARE_PROVIDER_SITE_OTHER): Payer: Medicare HMO | Admitting: Cardiology

## 2015-04-13 ENCOUNTER — Encounter: Payer: Self-pay | Admitting: Cardiology

## 2015-04-13 VITALS — BP 130/62 | HR 66 | Ht 64.5 in | Wt 237.0 lb

## 2015-04-13 DIAGNOSIS — I480 Paroxysmal atrial fibrillation: Secondary | ICD-10-CM

## 2015-04-13 DIAGNOSIS — Z5181 Encounter for therapeutic drug level monitoring: Secondary | ICD-10-CM | POA: Diagnosis not present

## 2015-04-13 DIAGNOSIS — I10 Essential (primary) hypertension: Secondary | ICD-10-CM

## 2015-04-13 LAB — POCT INR: INR: 2.2

## 2015-04-13 MED ORDER — FLECAINIDE ACETATE 100 MG PO TABS: 100.0000 mg | ORAL_TABLET | Freq: Two times a day (BID) | ORAL | Status: DC

## 2015-04-13 NOTE — Patient Instructions (Signed)

## 2015-04-26 ENCOUNTER — Other Ambulatory Visit: Payer: Self-pay | Admitting: Cardiology

## 2015-05-11 ENCOUNTER — Other Ambulatory Visit: Payer: Self-pay | Admitting: Cardiology

## 2015-05-12 ENCOUNTER — Ambulatory Visit (INDEPENDENT_AMBULATORY_CARE_PROVIDER_SITE_OTHER): Payer: Medicare HMO | Admitting: *Deleted

## 2015-05-12 DIAGNOSIS — I480 Paroxysmal atrial fibrillation: Secondary | ICD-10-CM

## 2015-05-12 DIAGNOSIS — Z5181 Encounter for therapeutic drug level monitoring: Secondary | ICD-10-CM

## 2015-05-12 LAB — POCT INR: INR: 2.7

## 2015-06-09 ENCOUNTER — Ambulatory Visit (INDEPENDENT_AMBULATORY_CARE_PROVIDER_SITE_OTHER): Payer: Medicare HMO | Admitting: Pharmacist

## 2015-06-09 DIAGNOSIS — Z5181 Encounter for therapeutic drug level monitoring: Secondary | ICD-10-CM | POA: Diagnosis not present

## 2015-06-09 DIAGNOSIS — I480 Paroxysmal atrial fibrillation: Secondary | ICD-10-CM

## 2015-06-09 LAB — POCT INR: INR: 3

## 2015-06-15 ENCOUNTER — Other Ambulatory Visit: Payer: Self-pay | Admitting: Cardiology

## 2015-06-26 ENCOUNTER — Other Ambulatory Visit: Payer: Self-pay | Admitting: Cardiology

## 2015-06-29 ENCOUNTER — Other Ambulatory Visit: Payer: Self-pay | Admitting: Cardiology

## 2015-07-01 ENCOUNTER — Telehealth: Payer: Self-pay

## 2015-07-01 NOTE — Telephone Encounter (Signed)
Letter received from [facility], denying a Tier Exception for Xarelto. This is confusing because she in on Warfarin, and has been since 11/2014.

## 2015-07-07 ENCOUNTER — Ambulatory Visit (INDEPENDENT_AMBULATORY_CARE_PROVIDER_SITE_OTHER): Payer: Medicare HMO | Admitting: Surgery

## 2015-07-07 DIAGNOSIS — Z5181 Encounter for therapeutic drug level monitoring: Secondary | ICD-10-CM | POA: Diagnosis not present

## 2015-07-07 DIAGNOSIS — I480 Paroxysmal atrial fibrillation: Secondary | ICD-10-CM

## 2015-07-07 LAB — POCT INR: INR: 2.1

## 2015-08-19 ENCOUNTER — Ambulatory Visit (INDEPENDENT_AMBULATORY_CARE_PROVIDER_SITE_OTHER): Payer: Medicare HMO | Admitting: *Deleted

## 2015-08-19 DIAGNOSIS — I480 Paroxysmal atrial fibrillation: Secondary | ICD-10-CM | POA: Diagnosis not present

## 2015-08-19 DIAGNOSIS — Z5181 Encounter for therapeutic drug level monitoring: Secondary | ICD-10-CM

## 2015-08-19 LAB — POCT INR: INR: 2.9

## 2015-09-18 ENCOUNTER — Other Ambulatory Visit: Payer: Self-pay | Admitting: Cardiology

## 2015-09-20 ENCOUNTER — Other Ambulatory Visit: Payer: Self-pay | Admitting: *Deleted

## 2015-09-21 ENCOUNTER — Other Ambulatory Visit: Payer: Self-pay | Admitting: *Deleted

## 2015-09-21 MED ORDER — METOPROLOL SUCCINATE ER 50 MG PO TB24: 50.0000 mg | ORAL_TABLET | Freq: Every day | ORAL | 2 refills | Status: DC

## 2015-09-21 MED ORDER — SIMVASTATIN 40 MG PO TABS: ORAL_TABLET | ORAL | 2 refills | Status: DC

## 2015-09-30 ENCOUNTER — Ambulatory Visit (INDEPENDENT_AMBULATORY_CARE_PROVIDER_SITE_OTHER): Payer: Medicare HMO | Admitting: Pharmacist

## 2015-09-30 ENCOUNTER — Encounter (INDEPENDENT_AMBULATORY_CARE_PROVIDER_SITE_OTHER): Payer: Self-pay

## 2015-09-30 DIAGNOSIS — Z5181 Encounter for therapeutic drug level monitoring: Secondary | ICD-10-CM | POA: Diagnosis not present

## 2015-09-30 DIAGNOSIS — I480 Paroxysmal atrial fibrillation: Secondary | ICD-10-CM

## 2015-09-30 LAB — POCT INR: INR: 2.7

## 2015-11-11 ENCOUNTER — Encounter (INDEPENDENT_AMBULATORY_CARE_PROVIDER_SITE_OTHER): Payer: Self-pay

## 2015-11-11 ENCOUNTER — Ambulatory Visit (INDEPENDENT_AMBULATORY_CARE_PROVIDER_SITE_OTHER): Payer: Medicare HMO | Admitting: *Deleted

## 2015-11-11 DIAGNOSIS — Z5181 Encounter for therapeutic drug level monitoring: Secondary | ICD-10-CM

## 2015-11-11 DIAGNOSIS — I480 Paroxysmal atrial fibrillation: Secondary | ICD-10-CM

## 2015-11-11 LAB — POCT INR: INR: 2.9

## 2015-12-23 ENCOUNTER — Ambulatory Visit (INDEPENDENT_AMBULATORY_CARE_PROVIDER_SITE_OTHER): Payer: Medicare HMO | Admitting: *Deleted

## 2015-12-23 ENCOUNTER — Encounter (INDEPENDENT_AMBULATORY_CARE_PROVIDER_SITE_OTHER): Payer: Self-pay

## 2015-12-23 DIAGNOSIS — Z5181 Encounter for therapeutic drug level monitoring: Secondary | ICD-10-CM

## 2015-12-23 DIAGNOSIS — I480 Paroxysmal atrial fibrillation: Secondary | ICD-10-CM | POA: Diagnosis not present

## 2015-12-23 LAB — POCT INR: INR: 2.4

## 2016-01-25 ENCOUNTER — Other Ambulatory Visit: Payer: Self-pay | Admitting: Cardiology

## 2016-02-02 ENCOUNTER — Other Ambulatory Visit: Payer: Self-pay | Admitting: Cardiology

## 2016-02-10 ENCOUNTER — Ambulatory Visit (INDEPENDENT_AMBULATORY_CARE_PROVIDER_SITE_OTHER): Payer: Medicare HMO | Admitting: *Deleted

## 2016-02-10 DIAGNOSIS — Z5181 Encounter for therapeutic drug level monitoring: Secondary | ICD-10-CM

## 2016-02-10 DIAGNOSIS — I480 Paroxysmal atrial fibrillation: Secondary | ICD-10-CM

## 2016-02-10 LAB — POCT INR: INR: 3

## 2016-02-22 ENCOUNTER — Other Ambulatory Visit: Payer: Self-pay | Admitting: Cardiology

## 2016-03-21 ENCOUNTER — Other Ambulatory Visit: Payer: Self-pay | Admitting: Cardiology

## 2016-03-23 ENCOUNTER — Encounter (INDEPENDENT_AMBULATORY_CARE_PROVIDER_SITE_OTHER): Payer: Self-pay

## 2016-03-23 ENCOUNTER — Ambulatory Visit (INDEPENDENT_AMBULATORY_CARE_PROVIDER_SITE_OTHER): Payer: Medicare Other | Admitting: Pharmacist

## 2016-03-23 DIAGNOSIS — Z5181 Encounter for therapeutic drug level monitoring: Secondary | ICD-10-CM | POA: Diagnosis not present

## 2016-03-23 DIAGNOSIS — I480 Paroxysmal atrial fibrillation: Secondary | ICD-10-CM

## 2016-03-23 LAB — POCT INR: INR: 2.9

## 2016-04-17 ENCOUNTER — Other Ambulatory Visit: Payer: Self-pay | Admitting: Cardiology

## 2016-04-18 ENCOUNTER — Other Ambulatory Visit: Payer: Self-pay | Admitting: Cardiology

## 2016-05-01 ENCOUNTER — Other Ambulatory Visit: Payer: Self-pay | Admitting: Cardiology

## 2016-05-03 ENCOUNTER — Other Ambulatory Visit: Payer: Self-pay | Admitting: Cardiology

## 2016-05-04 ENCOUNTER — Ambulatory Visit (INDEPENDENT_AMBULATORY_CARE_PROVIDER_SITE_OTHER): Payer: Medicare Other | Admitting: *Deleted

## 2016-05-04 ENCOUNTER — Other Ambulatory Visit: Payer: Self-pay | Admitting: Cardiology

## 2016-05-04 ENCOUNTER — Telehealth: Payer: Self-pay | Admitting: Cardiology

## 2016-05-04 ENCOUNTER — Encounter (INDEPENDENT_AMBULATORY_CARE_PROVIDER_SITE_OTHER): Payer: Self-pay

## 2016-05-04 DIAGNOSIS — Z5181 Encounter for therapeutic drug level monitoring: Secondary | ICD-10-CM | POA: Diagnosis not present

## 2016-05-04 DIAGNOSIS — I480 Paroxysmal atrial fibrillation: Secondary | ICD-10-CM | POA: Diagnosis not present

## 2016-05-04 LAB — POCT INR: INR: 3

## 2016-05-04 MED ORDER — FENOFIBRATE MICRONIZED 200 MG PO CAPS: ORAL_CAPSULE | ORAL | 0 refills | Status: DC

## 2016-05-04 NOTE — Telephone Encounter (Signed)
New message        *STAT* If patient is at the pharmacy, call can be transferred to refill team.   1. Which medications need to be refilled? (please list name of each medication and dose if known) fenofibrate 200mg  2. Which pharmacy/location (including street and city if local pharmacy) is medication to be sent to? [address]. Do they need a 30 day or 90 day supply? 30 day    Pt has an appt scheduled on 06-07-16

## 2016-05-04 NOTE — Telephone Encounter (Signed)
Pt' medication was sent to pt's pharmacy as requested. Confirmation received.  

## 2016-05-17 ENCOUNTER — Other Ambulatory Visit: Payer: Self-pay | Admitting: Cardiology

## 2016-05-22 ENCOUNTER — Encounter: Payer: Self-pay | Admitting: Cardiology

## 2016-06-01 ENCOUNTER — Other Ambulatory Visit: Payer: Self-pay | Admitting: Cardiology

## 2016-06-01 NOTE — Telephone Encounter (Signed)
New message     *STAT* If patient is at the pharmacy, call can be transferred to refill team.   1. Which medications need to be refilled? (please list name of each medication and dose if known)  lisinopril (PRINIVIL,ZESTRIL) 20 MG tablet TAKE 1 TABLET BY MOUTH EVERY DAY    2. Which pharmacy/location (including street and city if local pharmacy) is medication to be sent to? [facility] on [address]   3. Do they need a 30 day or 90 day supply? 30  Has appt on 06/07/16

## 2016-06-07 ENCOUNTER — Ambulatory Visit (INDEPENDENT_AMBULATORY_CARE_PROVIDER_SITE_OTHER): Payer: Medicare Other | Admitting: Cardiology

## 2016-06-07 ENCOUNTER — Encounter (INDEPENDENT_AMBULATORY_CARE_PROVIDER_SITE_OTHER): Payer: Self-pay

## 2016-06-07 ENCOUNTER — Encounter: Payer: Self-pay | Admitting: Cardiology

## 2016-06-07 VITALS — BP 110/70 | HR 65 | Ht 65.0 in | Wt 222.8 lb

## 2016-06-07 DIAGNOSIS — Z6837 Body mass index (BMI) 37.0-37.9, adult: Secondary | ICD-10-CM | POA: Diagnosis not present

## 2016-06-07 DIAGNOSIS — I10 Essential (primary) hypertension: Secondary | ICD-10-CM

## 2016-06-07 DIAGNOSIS — I4819 Other persistent atrial fibrillation: Secondary | ICD-10-CM

## 2016-06-07 DIAGNOSIS — I481 Persistent atrial fibrillation: Secondary | ICD-10-CM

## 2016-06-07 MED ORDER — LISINOPRIL 20 MG PO TABS: 20.0000 mg | ORAL_TABLET | Freq: Every day | ORAL | 3 refills | Status: DC

## 2016-06-07 NOTE — Progress Notes (Signed)
Cardiology Office Note    Date:  06/07/2016   ID:  Latasha Hodges, DOB 11/21/1945, MRN [id number]  PCP:  Osborne Casco, MD  Cardiologist:  Fransico Him, MD   Chief Complaint  Patient presents with  . Atrial Fibrillation  . Hypertension    History of Present Illness:  Latasha Hodges is a 71 y.o. female with a history of persistent atrial fibrillation and HTN.  She presents today for followup and  is doing well. She denies any chest pain or pressure, SOB, DOE, LE edema, dizziness, PND, orthopnea or syncope. Once in a while she will feel a few skipped beats but no afib.   Past Medical History:  Diagnosis Date  . Bronchitis, chronic, simple ([place])   . Diabetes mellitus   . Hyperlipidemia   . Hypertension   . Obesity   . Persistent atrial fibrillation [facility])     Past Surgical History:  Procedure Laterality Date  . COLONOSCOPY  12/29/2010   Procedure: COLONOSCOPY;  Surgeon: Owens Loffler, MD;  Location: [facility];  Service: Endoscopy;  Laterality: N/A;  . TUBAL LIGATION  1980    Current Medications: Current Meds  Medication Sig  . fenofibrate micronized (LOFIBRA) 200 MG capsule TAKE ONE CAPSULE BY MOUTH EVERY MORNING BEFORE BREAKFAST  . flecainide (TAMBOCOR) 100 MG tablet Take 1 tablet (100 mg total) by mouth 2 (two) times daily. *Please keep 06/07/16 appointment for further refills*  . lisinopril (PRINIVIL,ZESTRIL) 20 MG tablet TAKE 1 TABLET BY MOUTH EVERY DAY  . metFORMIN (GLUCOPHAGE) 1000 MG tablet Take 1,000 mg by mouth 2 (two) times daily.  . metoprolol succinate (TOPROL-XL) 50 MG 24 hr tablet Take 1 tablet (50 mg total) by mouth daily. Take with or immediately following a meal.  . ONGLYZA 5 MG TABS tablet Take 5 mg by mouth daily.  . simvastatin (ZOCOR) 40 MG tablet TAKE 1 TABLET(40 MG) BY MOUTH AT BEDTIME  . warfarin (COUMADIN) 5 MG tablet TAKE AS DIRECTED BY COUMADIN CLINIC    Allergies:   Iodinated diagnostic agents; Penicillins; and Sulfa drugs  cross reactors   Social History   Social History  . Marital status: Divorced    Spouse name: N/A  . Number of children: 2  . Years of education: N/A   Occupational History  . ADMIN ASSISTANT    Social History Main Topics  . Smoking status: Former Smoker    Quit date: 12/28/1981  . Smokeless tobacco: Never Used  . Alcohol use No  . Drug use: No  . Sexual activity: No   Other Topics Concern  . None   Social History Narrative   0 caffeine drinks daily      Family History:  The patient's family history includes Cancer in her mother; Colonic polyp in her cousin; Diabetes in her father; Hypertension in her father.   ROS:   Please see the history of present illness.    ROS All other systems reviewed and are negative.  No flowsheet data found.     PHYSICAL EXAM:   VS:  BP 110/70 (BP Location: Left Arm, Patient Position: Sitting, Cuff Size: Large)   Pulse 65   Ht 5\' 5"  (1.651 m)   Wt 222 lb 12.8 oz (101.1 kg)   BMI 37.08 kg/m    GEN: Well nourished, well developed, in no acute distress  HEENT: normal  Neck: no JVD, carotid bruits, or masses Cardiac: RRR; no murmurs, rubs, or gallops,no edema.  Intact distal pulses bilaterally.  Respiratory:  clear  to auscultation bilaterally, normal work of breathing GI: soft, nontender, nondistended, + BS MS: no deformity or atrophy  Skin: warm and dry, no rash Neuro:  Alert and Oriented x 3, Strength and sensation are intact Psych: euthymic mood, full affect  Wt Readings from Last 3 Encounters:  06/07/16 222 lb 12.8 oz (101.1 kg)  04/13/15 237 lb (107.5 kg)  08/10/14 221 lb 6.4 oz (100.4 kg)      Studies/Labs Reviewed:   EKG:  EKG is ordered today.  The ekg ordered today demonstrates NSR at 65bpm with first degree AV block and no ST changes  Recent Labs: No results found for requested labs within last 8760 hours.   Lipid Panel    Component Value Date/Time   CHOL 180 08/12/2014 0805   TRIG (H) 08/12/2014 0805     533.0 Triglyceride is over 400; calculations on Lipids are invalid.   HDL 34.20 (L) 08/12/2014 0805   CHOLHDL 5 08/12/2014 0805   LDLDIRECT 69.0 08/12/2014 0805    Additional studies/ records that were reviewed today include:  none    ASSESSMENT:    1. Persistent atrial fibrillation ([place])   2. Essential hypertension   3. Class 2 severe obesity due to excess calories with serious comorbidity and body mass index (BMI) of 37.0 to 37.9 in adult [facility]      PLAN:  In order of problems listed above:  1. Persistent atrial fibrillation - she is maintaining NSR.  She will continue on BB, flecainide and warfarin.  2. HTN - BP is adequately controlled today.  She will continue on ACE I and BB. 3. Obesity - Her exercise is limited due to back pain.  I encouraged her to continue on weight watchers and has lost 15lbs.     Medication Adjustments/Labs and Tests Ordered: Current medicines are reviewed at length with the patient today.  Concerns regarding medicines are outlined above.  Medication changes, Labs and Tests ordered today are listed in the Patient Instructions below.  There are no Patient Instructions on file for this visit.   Signed, Fransico Him, MD  06/07/2016 8:07 AM    [facility], [place], [place]  [address] Phone: [phone]; Fax: [phone]

## 2016-06-07 NOTE — Addendum Note (Signed)
Addended by: Harland German A on: 06/07/2016 09:00 AM   Modules accepted: Orders

## 2016-06-07 NOTE — Patient Instructions (Signed)

## 2016-06-14 ENCOUNTER — Ambulatory Visit (INDEPENDENT_AMBULATORY_CARE_PROVIDER_SITE_OTHER): Payer: Medicare Other | Admitting: *Deleted

## 2016-06-14 DIAGNOSIS — Z5181 Encounter for therapeutic drug level monitoring: Secondary | ICD-10-CM

## 2016-06-14 DIAGNOSIS — I481 Persistent atrial fibrillation: Secondary | ICD-10-CM | POA: Diagnosis not present

## 2016-06-14 DIAGNOSIS — I480 Paroxysmal atrial fibrillation: Secondary | ICD-10-CM

## 2016-06-14 DIAGNOSIS — I4819 Other persistent atrial fibrillation: Secondary | ICD-10-CM

## 2016-06-14 LAB — POCT INR: INR: 1.7

## 2016-06-15 ENCOUNTER — Other Ambulatory Visit: Payer: Self-pay | Admitting: Cardiology

## 2016-06-21 ENCOUNTER — Other Ambulatory Visit: Payer: Self-pay | Admitting: Cardiology

## 2016-06-28 ENCOUNTER — Other Ambulatory Visit: Payer: Self-pay | Admitting: Cardiology

## 2016-06-28 ENCOUNTER — Ambulatory Visit (INDEPENDENT_AMBULATORY_CARE_PROVIDER_SITE_OTHER): Payer: Medicare Other | Admitting: *Deleted

## 2016-06-28 DIAGNOSIS — I4819 Other persistent atrial fibrillation: Secondary | ICD-10-CM

## 2016-06-28 DIAGNOSIS — I480 Paroxysmal atrial fibrillation: Secondary | ICD-10-CM | POA: Diagnosis not present

## 2016-06-28 DIAGNOSIS — Z5181 Encounter for therapeutic drug level monitoring: Secondary | ICD-10-CM

## 2016-06-28 DIAGNOSIS — I481 Persistent atrial fibrillation: Secondary | ICD-10-CM

## 2016-06-28 LAB — POCT INR: INR: 2.3

## 2016-06-29 ENCOUNTER — Other Ambulatory Visit: Payer: Self-pay | Admitting: Cardiology

## 2016-08-28 ENCOUNTER — Other Ambulatory Visit: Payer: Self-pay | Admitting: Cardiology

## 2016-08-29 ENCOUNTER — Encounter (INDEPENDENT_AMBULATORY_CARE_PROVIDER_SITE_OTHER): Payer: Self-pay

## 2016-08-29 ENCOUNTER — Ambulatory Visit (INDEPENDENT_AMBULATORY_CARE_PROVIDER_SITE_OTHER): Payer: Medicare Other | Admitting: *Deleted

## 2016-08-29 DIAGNOSIS — Z5181 Encounter for therapeutic drug level monitoring: Secondary | ICD-10-CM | POA: Diagnosis not present

## 2016-08-29 DIAGNOSIS — I4819 Other persistent atrial fibrillation: Secondary | ICD-10-CM

## 2016-08-29 DIAGNOSIS — I481 Persistent atrial fibrillation: Secondary | ICD-10-CM | POA: Diagnosis not present

## 2016-08-29 DIAGNOSIS — I480 Paroxysmal atrial fibrillation: Secondary | ICD-10-CM | POA: Diagnosis not present

## 2016-08-29 LAB — POCT INR: INR: 2.1

## 2016-09-08 ENCOUNTER — Telehealth: Payer: Self-pay | Admitting: Cardiology

## 2016-09-08 NOTE — Telephone Encounter (Signed)
Spoke with pt & instructed pt that the certain antibiotics interfere with Coumadin & Flecainide. She stated that they will wanted to prescribe Levaquin, but she cannot take that med because it interferes with both Coumadin and Flecainide per Pharmacist Megan. Pt wants specific antibiotics & advised that we do not tell the doctor what antibiotic to put the pt on but to see what med they want to out her on & we could tell her if it interferes with her meds.

## 2016-09-08 NOTE — Telephone Encounter (Signed)
Pt was placed on a brief hold to ask Pharmacy about interaction with Z-pak while on Flecainide & Coumadin, returned to the line & the pt was not on the line. Atempted to call the pt back x 2 without answer & left a msg to calback.

## 2016-09-08 NOTE — Telephone Encounter (Signed)
New Message     Pt was diagnosed with bronchitis, she wants to know what antibiotic she can take with the  flecainide (TAMBOCOR) 100 MG tablet TAKE 1 TABLET BY MOUTH TWICE     warfarin (COUMADIN) 5 MG tablet TAKE AS DIRECTED BY COUMADIN CLINIC

## 2016-09-08 NOTE — Telephone Encounter (Signed)
Returned call back to pt with any answer. Will await call back from pt.

## 2016-10-12 ENCOUNTER — Ambulatory Visit (INDEPENDENT_AMBULATORY_CARE_PROVIDER_SITE_OTHER): Payer: Medicare Other

## 2016-10-12 DIAGNOSIS — Z5181 Encounter for therapeutic drug level monitoring: Secondary | ICD-10-CM | POA: Diagnosis not present

## 2016-10-12 DIAGNOSIS — I480 Paroxysmal atrial fibrillation: Secondary | ICD-10-CM

## 2016-10-12 DIAGNOSIS — I481 Persistent atrial fibrillation: Secondary | ICD-10-CM | POA: Diagnosis not present

## 2016-10-12 DIAGNOSIS — I4819 Other persistent atrial fibrillation: Secondary | ICD-10-CM

## 2016-10-12 LAB — POCT INR: INR: 2

## 2016-11-23 ENCOUNTER — Ambulatory Visit (INDEPENDENT_AMBULATORY_CARE_PROVIDER_SITE_OTHER): Payer: Medicare Other

## 2016-11-23 DIAGNOSIS — I480 Paroxysmal atrial fibrillation: Secondary | ICD-10-CM | POA: Diagnosis not present

## 2016-11-23 DIAGNOSIS — I4819 Other persistent atrial fibrillation: Secondary | ICD-10-CM

## 2016-11-23 DIAGNOSIS — Z5181 Encounter for therapeutic drug level monitoring: Secondary | ICD-10-CM

## 2016-11-23 LAB — POCT INR: INR: 3.4

## 2016-12-04 ENCOUNTER — Other Ambulatory Visit: Payer: Self-pay | Admitting: Cardiology

## 2016-12-21 ENCOUNTER — Ambulatory Visit (INDEPENDENT_AMBULATORY_CARE_PROVIDER_SITE_OTHER): Payer: Medicare Other | Admitting: *Deleted

## 2016-12-21 DIAGNOSIS — Z5181 Encounter for therapeutic drug level monitoring: Secondary | ICD-10-CM

## 2016-12-21 DIAGNOSIS — I480 Paroxysmal atrial fibrillation: Secondary | ICD-10-CM | POA: Diagnosis not present

## 2016-12-21 DIAGNOSIS — I481 Persistent atrial fibrillation: Secondary | ICD-10-CM | POA: Diagnosis not present

## 2016-12-21 DIAGNOSIS — I4819 Other persistent atrial fibrillation: Secondary | ICD-10-CM

## 2016-12-21 LAB — POCT INR: INR: 3.9

## 2017-01-08 ENCOUNTER — Ambulatory Visit (INDEPENDENT_AMBULATORY_CARE_PROVIDER_SITE_OTHER): Payer: Medicare Other | Admitting: *Deleted

## 2017-01-08 DIAGNOSIS — Z5181 Encounter for therapeutic drug level monitoring: Secondary | ICD-10-CM | POA: Diagnosis not present

## 2017-01-08 DIAGNOSIS — I480 Paroxysmal atrial fibrillation: Secondary | ICD-10-CM | POA: Diagnosis not present

## 2017-01-08 DIAGNOSIS — I481 Persistent atrial fibrillation: Secondary | ICD-10-CM

## 2017-01-08 DIAGNOSIS — I4819 Other persistent atrial fibrillation: Secondary | ICD-10-CM

## 2017-01-08 LAB — POCT INR: INR: 3.1

## 2017-01-31 ENCOUNTER — Encounter (INDEPENDENT_AMBULATORY_CARE_PROVIDER_SITE_OTHER): Payer: Self-pay

## 2017-01-31 ENCOUNTER — Ambulatory Visit (INDEPENDENT_AMBULATORY_CARE_PROVIDER_SITE_OTHER): Payer: Medicare Other

## 2017-01-31 DIAGNOSIS — Z5181 Encounter for therapeutic drug level monitoring: Secondary | ICD-10-CM

## 2017-01-31 DIAGNOSIS — I481 Persistent atrial fibrillation: Secondary | ICD-10-CM | POA: Diagnosis not present

## 2017-01-31 DIAGNOSIS — I480 Paroxysmal atrial fibrillation: Secondary | ICD-10-CM

## 2017-01-31 DIAGNOSIS — I4819 Other persistent atrial fibrillation: Secondary | ICD-10-CM

## 2017-01-31 LAB — POCT INR: INR: 1.6

## 2017-01-31 NOTE — Patient Instructions (Signed)
Take 1.5 tablets today, then start taking 1 tablet every day except 1/2 tablet on Tuesdays, Thursdays, and Saturdays.  Recheck INR in 3 weeks.  Call our office if you have any questions or if placed on any new medications [phone].

## 2017-02-21 ENCOUNTER — Ambulatory Visit (INDEPENDENT_AMBULATORY_CARE_PROVIDER_SITE_OTHER): Payer: Medicare Other

## 2017-02-21 DIAGNOSIS — I480 Paroxysmal atrial fibrillation: Secondary | ICD-10-CM | POA: Diagnosis not present

## 2017-02-21 DIAGNOSIS — I4819 Other persistent atrial fibrillation: Secondary | ICD-10-CM

## 2017-02-21 DIAGNOSIS — Z5181 Encounter for therapeutic drug level monitoring: Secondary | ICD-10-CM | POA: Diagnosis not present

## 2017-02-21 DIAGNOSIS — I481 Persistent atrial fibrillation: Secondary | ICD-10-CM | POA: Diagnosis not present

## 2017-02-21 LAB — POCT INR: INR: 2.8

## 2017-02-21 NOTE — Patient Instructions (Signed)
Description   Continue on same dosage 1 tablet every day except 1/2 tablet on Tuesdays, Thursdays, and Saturdays.  Recheck INR in 4 weeks.  Call our office if you have any questions or if placed on any new medications [phone].

## 2017-03-21 ENCOUNTER — Ambulatory Visit (INDEPENDENT_AMBULATORY_CARE_PROVIDER_SITE_OTHER): Payer: Medicare Other | Admitting: *Deleted

## 2017-03-21 DIAGNOSIS — I480 Paroxysmal atrial fibrillation: Secondary | ICD-10-CM | POA: Diagnosis not present

## 2017-03-21 DIAGNOSIS — Z5181 Encounter for therapeutic drug level monitoring: Secondary | ICD-10-CM

## 2017-03-21 DIAGNOSIS — I481 Persistent atrial fibrillation: Secondary | ICD-10-CM

## 2017-03-21 DIAGNOSIS — I4819 Other persistent atrial fibrillation: Secondary | ICD-10-CM

## 2017-03-21 LAB — POCT INR: INR: 2.7

## 2017-03-21 NOTE — Patient Instructions (Signed)
Description   Continue on same dosage 1 tablet every day except 1/2 tablet on Tuesdays, Thursdays, and Saturdays.  Recheck INR in 4 weeks.  Call our office if you have any questions or if placed on any new medications [phone].

## 2017-04-18 ENCOUNTER — Ambulatory Visit (INDEPENDENT_AMBULATORY_CARE_PROVIDER_SITE_OTHER): Payer: Medicare Other | Admitting: Pharmacist

## 2017-04-18 DIAGNOSIS — Z5181 Encounter for therapeutic drug level monitoring: Secondary | ICD-10-CM | POA: Diagnosis not present

## 2017-04-18 DIAGNOSIS — I4819 Other persistent atrial fibrillation: Secondary | ICD-10-CM

## 2017-04-18 DIAGNOSIS — I481 Persistent atrial fibrillation: Secondary | ICD-10-CM | POA: Diagnosis not present

## 2017-04-18 DIAGNOSIS — I480 Paroxysmal atrial fibrillation: Secondary | ICD-10-CM | POA: Diagnosis not present

## 2017-04-18 LAB — POCT INR: INR: 2.5

## 2017-04-18 NOTE — Patient Instructions (Signed)
Description   Continue on same dosage 1 tablet every day except 1/2 tablet on Tuesdays, Thursdays, and Saturdays.  Recheck INR in 6 weeks.  Call our office if you have any questions or if placed on any new medications [phone].     

## 2017-05-15 ENCOUNTER — Telehealth: Payer: Self-pay | Admitting: Cardiology

## 2017-05-15 NOTE — Telephone Encounter (Signed)
New message    What antibiotics can she take IF she needs to request them from her doctor?

## 2017-05-15 NOTE — Telephone Encounter (Signed)
Pt called asking what antibiotic  she can take for Bronchitis as she is on Flecainide Spoke with Fara Boros PharmD and she states that she cannot take levaquin and Zpak but could take Tetracycline  Doxycycline and Amoxicillin Pt states she is allegic to penicillin and she hopes will not have to take anything for bronchitis at this time  Instructed to call if any antibiotics are ordered and she states she will do so

## 2017-05-15 NOTE — Telephone Encounter (Signed)
This is an established Coumadin pt in our clinic.  Will forward this message to them for further review and follow-up with the pt.

## 2017-05-31 ENCOUNTER — Ambulatory Visit (INDEPENDENT_AMBULATORY_CARE_PROVIDER_SITE_OTHER): Payer: Medicare Other | Admitting: *Deleted

## 2017-05-31 DIAGNOSIS — I481 Persistent atrial fibrillation: Secondary | ICD-10-CM

## 2017-05-31 DIAGNOSIS — Z5181 Encounter for therapeutic drug level monitoring: Secondary | ICD-10-CM

## 2017-05-31 DIAGNOSIS — I4819 Other persistent atrial fibrillation: Secondary | ICD-10-CM

## 2017-05-31 DIAGNOSIS — I480 Paroxysmal atrial fibrillation: Secondary | ICD-10-CM

## 2017-05-31 LAB — POCT INR: INR: 3

## 2017-05-31 NOTE — Patient Instructions (Signed)
Description   Continue on same dosage 1 tablet every day except 1/2 tablet on Tuesdays, Thursdays, and Saturdays.  Recheck INR in 6 weeks.  Call our office if you have any questions or if placed on any new medications [phone].     

## 2017-06-11 ENCOUNTER — Other Ambulatory Visit: Payer: Self-pay | Admitting: Cardiology

## 2017-06-13 ENCOUNTER — Other Ambulatory Visit: Payer: Self-pay | Admitting: Cardiology

## 2017-06-20 ENCOUNTER — Other Ambulatory Visit: Payer: Self-pay | Admitting: Cardiology

## 2017-07-07 ENCOUNTER — Other Ambulatory Visit: Payer: Self-pay | Admitting: Cardiology

## 2017-07-10 ENCOUNTER — Other Ambulatory Visit: Payer: Self-pay | Admitting: Cardiology

## 2017-07-12 ENCOUNTER — Encounter (INDEPENDENT_AMBULATORY_CARE_PROVIDER_SITE_OTHER): Payer: Self-pay

## 2017-07-12 ENCOUNTER — Ambulatory Visit (INDEPENDENT_AMBULATORY_CARE_PROVIDER_SITE_OTHER): Payer: Medicare Other | Admitting: *Deleted

## 2017-07-12 DIAGNOSIS — I481 Persistent atrial fibrillation: Secondary | ICD-10-CM | POA: Diagnosis not present

## 2017-07-12 DIAGNOSIS — I480 Paroxysmal atrial fibrillation: Secondary | ICD-10-CM | POA: Diagnosis not present

## 2017-07-12 DIAGNOSIS — I4819 Other persistent atrial fibrillation: Secondary | ICD-10-CM

## 2017-07-12 DIAGNOSIS — Z5181 Encounter for therapeutic drug level monitoring: Secondary | ICD-10-CM

## 2017-07-12 LAB — POCT INR: INR: 3.1 — AB (ref 2.0–3.0)

## 2017-07-12 NOTE — Patient Instructions (Signed)
Description   Tomorrow may 31st take 1/2 tablet as she has taken coumadin today then continue on same dosage 1 tablet every day except 1/2 tablet on Tuesdays, Thursdays, and Saturdays.  Recheck INR in 5 weeks.  Call our office if you have any questions or if placed on any new medications [phone].Do good serving of greens today and keep intake of greens consistent

## 2017-07-21 ENCOUNTER — Other Ambulatory Visit: Payer: Self-pay | Admitting: Cardiology

## 2017-07-23 ENCOUNTER — Other Ambulatory Visit: Payer: Self-pay | Admitting: Cardiology

## 2017-07-26 ENCOUNTER — Telehealth: Payer: Self-pay | Admitting: Cardiology

## 2017-07-26 NOTE — Telephone Encounter (Signed)
I spoke with pt. Per telephone note 05/15/17   Margretta Sidle, RN   05/15/17 12:24 PM  Note    Pt called asking what antibiotic  she can take for Bronchitis as she is on Flecainide Spoke with Fara Boros PharmD and she states that she cannot take levaquin and Zpak but could take Tetracycline  Doxycycline and Amoxicillin Pt states she is allegic to penicillin and she hopes will not have to take anything for bronchitis at this time  Instructed to call if any antibiotics are ordered and she states she will do so      She stated understanding and thankful for the call

## 2017-07-26 NOTE — Telephone Encounter (Signed)
New message    Pt is calling to find out which antibiotics she can take bacuase she has a sore throat and wants to check before going to get anything.

## 2017-07-27 ENCOUNTER — Encounter: Payer: Self-pay | Admitting: Cardiology

## 2017-08-08 ENCOUNTER — Other Ambulatory Visit: Payer: Self-pay | Admitting: Cardiology

## 2017-08-09 ENCOUNTER — Ambulatory Visit: Payer: Medicare Other | Admitting: Cardiology

## 2017-08-09 ENCOUNTER — Encounter: Payer: Self-pay | Admitting: Cardiology

## 2017-08-09 ENCOUNTER — Encounter: Payer: Self-pay | Admitting: *Deleted

## 2017-08-09 VITALS — BP 128/86 | HR 68 | Ht 65.0 in | Wt 216.0 lb

## 2017-08-09 DIAGNOSIS — I4510 Unspecified right bundle-branch block: Secondary | ICD-10-CM | POA: Diagnosis not present

## 2017-08-09 DIAGNOSIS — E7800 Pure hypercholesterolemia, unspecified: Secondary | ICD-10-CM | POA: Diagnosis not present

## 2017-08-09 DIAGNOSIS — I4819 Other persistent atrial fibrillation: Secondary | ICD-10-CM

## 2017-08-09 DIAGNOSIS — I481 Persistent atrial fibrillation: Secondary | ICD-10-CM

## 2017-08-09 DIAGNOSIS — I10 Essential (primary) hypertension: Secondary | ICD-10-CM | POA: Diagnosis not present

## 2017-08-09 DIAGNOSIS — E785 Hyperlipidemia, unspecified: Secondary | ICD-10-CM | POA: Insufficient documentation

## 2017-08-09 MED ORDER — FLECAINIDE ACETATE 50 MG PO TABS: 50.0000 mg | ORAL_TABLET | Freq: Two times a day (BID) | ORAL | 2 refills | Status: DC

## 2017-08-09 NOTE — Progress Notes (Signed)
Cardiology Office Note:    Date:  08/09/2017   ID:  [name], DOB [birth year], MRN [id number]  PCP:  Kelton Pillar, MD  Cardiologist:  No primary care provider on file.    Referring MD: Kelton Pillar, MD   Chief Complaint  Patient presents with  . Atrial Fibrillation  . Hypertension    History of Present Illness:    Latasha Hodges is a 72 y.o. female with a hx of persistent atrial fibrillation and HTN.  She is here today for followup and is doing well.  She denies any chest pain or pressure, SOB, DOE, PND, orthopnea, LE edema, dizziness, palpitations or syncope. She is compliant with her meds and is tolerating meds with no SE.    Past Medical History:  Diagnosis Date  . Bronchitis, chronic, simple ([place])   . Diabetes mellitus   . Hyperlipidemia   . Hypertension   . Obesity   . Persistent atrial fibrillation [facility])     Past Surgical History:  Procedure Laterality Date  . COLONOSCOPY  12/29/2010   Procedure: COLONOSCOPY;  Surgeon: Owens Loffler, MD;  Location: [facility];  Service: Endoscopy;  Laterality: N/A;  . TUBAL LIGATION  1980    Current Medications: Current Meds  Medication Sig  . fenofibrate micronized (LOFIBRA) 200 MG capsule TAKE 1 CAPSULE BY MOUTH DAILY BEFORE BREAKFAST  . flecainide (TAMBOCOR) 100 MG tablet TAKE 1 TABLET BY MOUTH TWICE DAILY  . lisinopril (PRINIVIL,ZESTRIL) 20 MG tablet TAKE 1 TABLET BY MOUTH DAILY. PLEASE KEEP UPCOMING APPOINTMENT FOR FURTHER REFILLS  . metFORMIN (GLUCOPHAGE) 1000 MG tablet Take 1,000 mg by mouth 2 (two) times daily.  . metoprolol succinate (TOPROL-XL) 50 MG 24 hr tablet TAKE 1 TABLET BY MOUTH DAILY WITH FOOD  . ONGLYZA 5 MG TABS tablet Take 5 mg by mouth daily.  . simvastatin (ZOCOR) 40 MG tablet Take 1 tablet (40 mg total) by mouth daily at 6 PM. Please keep upcoming appt for future refills. Thank you  . TRULICITY 6.26 RS/8.5IO SOPN Inject 0.75 mg as directed daily.   Marland Kitchen warfarin (COUMADIN) 5 MG tablet TAKE  AS DIRECTED BY COUMADIN CLINIC     Allergies:   Iodinated diagnostic agents; Penicillins; and Sulfa drugs cross reactors   Social History   Socioeconomic History  . Marital status: Divorced    Spouse name: Not on file  . Number of children: 2  . Years of education: Not on file  . Highest education level: Not on file  Occupational History  . Occupation: ADMIN ASSISTANT  Social Needs  . Financial resource strain: Not on file  . Food insecurity:    Worry: Not on file    Inability: Not on file  . Transportation needs:    Medical: Not on file    Non-medical: Not on file  Tobacco Use  . Smoking status: Former Smoker    Last attempt to quit: 12/28/1981    Years since quitting: 35.6  . Smokeless tobacco: Never Used  Substance and Sexual Activity  . Alcohol use: No  . Drug use: No  . Sexual activity: Never    Birth control/protection: Post-menopausal  Lifestyle  . Physical activity:    Days per week: Not on file    Minutes per session: Not on file  . Stress: Not on file  Relationships  . Social connections:    Talks on phone: Not on file    Gets together: Not on file    Attends religious service: Not on file  Active member of club or organization: Not on file    Attends meetings of clubs or organizations: Not on file    Relationship status: Not on file  Other Topics Concern  . Not on file  Social History Narrative   0 caffeine drinks daily      Family History: The patient's family history includes Cancer in her mother; Colonic polyp in her cousin; Diabetes in her father; Hypertension in her father. There is no history of Colon cancer.  ROS:   Please see the history of present illness.    ROS  All other systems reviewed and negative.   EKGs/Labs/Other Studies Reviewed:    The following studies were reviewed today: none  EKG:  EKG is  ordered today.  The ekg ordered today demonstrates normal sinus rhythm with first-degree AV block and right bundle branch  block.  Recent Labs: No results found for requested labs within last 8760 hours.   Recent Lipid Panel    Component Value Date/Time   CHOL 180 08/12/2014 0805   TRIG (H) 08/12/2014 0805    533.0 Triglyceride is over 400; calculations on Lipids are invalid.   HDL 34.20 (L) 08/12/2014 0805   CHOLHDL 5 08/12/2014 0805   LDLDIRECT 69.0 08/12/2014 0805    Physical Exam:    VS:  BP 128/86   Pulse 68   Ht 5\' 5"  (1.651 m)   Wt 216 lb (98 kg)   SpO2 95%   BMI 35.94 kg/m     Wt Readings from Last 3 Encounters:  08/09/17 216 lb (98 kg)  06/07/16 222 lb 12.8 oz (101.1 kg)  04/13/15 237 lb (107.5 kg)     GEN:  Well nourished, well developed in no acute distress HEENT: Normal NECK: No JVD; No carotid bruits LYMPHATICS: No lymphadenopathy CARDIAC: RRR, no murmurs, rubs, gallops RESPIRATORY:  Clear to auscultation without rales, wheezing or rhonchi  ABDOMEN: Soft, non-tender, non-distended MUSCULOSKELETAL:  No edema; No deformity  SKIN: Warm and dry NEUROLOGIC:  Alert and oriented x 3 PSYCHIATRIC:  Normal affect   ASSESSMENT:    1. Persistent atrial fibrillation ([place])   2. Essential hypertension   3. Pure hypercholesterolemia    PLAN:    In order of problems listed above:  1.  Persistent atrial fibrillation -she is maintaining normal sinus rhythm on exam today.  She will continue on flecainide 100 mg twice daily, Toprol-XL 50 mg daily and warfarin.  The new right bundle branch block on exam today.  I suspect this is not due to flecainide and is likely due to conduction system disease due to aging.  I will drop her flecainide back to 50 mg twice daily and check a flecainide level.  I will repeat an EKG in 1 week.  I will also set her up for a Lexiscan Myoview to rule out ischemia.  2.  HTN -BP is well controlled on exam today.  She will continue on lisinopril 20 mg daily and Toprol 50 mg daily.  Creatinine was stable at 0.69 on 04/10/2017.  3.  Hyperlipidemia.  Last LDL was 30  on 04/10/2017 and ALT 12.  She will continue on fenofibrate 200 mg daily and simvastatin 40 mg daily.     Medication Adjustments/Labs and Tests Ordered: Current medicines are reviewed at length with the patient today.  Concerns regarding medicines are outlined above.  No orders of the defined types were placed in this encounter.  No orders of the defined types were placed in this  encounter.   Signed, Fransico Him, MD  08/09/2017 8:13 AM    [place]

## 2017-08-09 NOTE — Patient Instructions (Signed)
Medication Instructions:   DECREASE YOUR FLECAINIDE TO 50 MG TWICE DAILY    Labwork:  TODAY---BMET, MAGNESIUM, AND FLECAINIDE LEVEL    Testing/Procedures:  Your physician has requested that you have a lexiscan myoview. For further information please visit [email or web address]. Please follow instruction sheet, as given. PER DR TURNER THIS NEEDS TO BE SCHEDULED BEFORE August 23, 2017, FOR THE PT WILL BE GOING ON A CRUISE AFTER THAT     Follow-Up:  Your physician wants you to follow-up in: 6 MONTHS WITH DR [name] You will receive a reminder letter in the mail two months in advance. If you don't receive a letter, please call our office to schedule the follow-up appointment.    2 WEEKS FOR NURSE VISIT FOR EKG--FOLLOW-UP ON DECREASED FLECAINIDE       If you need a refill on your cardiac medications before your next appointment, please call your pharmacy.

## 2017-08-10 ENCOUNTER — Telehealth (HOSPITAL_COMMUNITY): Payer: Self-pay

## 2017-08-10 NOTE — Telephone Encounter (Signed)
Encounter complete. 

## 2017-08-11 LAB — BASIC METABOLIC PANEL
BUN / CREAT RATIO: 25 (ref 12–28)
BUN: 17 mg/dL (ref 8–27)
CHLORIDE: 101 mmol/L (ref 96–106)
CO2: 22 mmol/L (ref 20–29)
Calcium: 9.5 mg/dL (ref 8.7–10.3)
Creatinine, Ser: 0.67 mg/dL (ref 0.57–1.00)
GFR calc non Af Amer: 89 mL/min/{1.73_m2} (ref >59)
GFR, EST AFRICAN AMERICAN: 103 mL/min/{1.73_m2} (ref >59)
GLUCOSE: 164 mg/dL — AB (ref 65–99)
POTASSIUM: 4.6 mmol/L (ref 3.5–5.2)
Sodium: 138 mmol/L (ref 134–144)

## 2017-08-11 LAB — MAGNESIUM: MAGNESIUM: 1.3 mg/dL — AB (ref 1.6–2.3)

## 2017-08-11 LAB — FLECAINIDE LEVEL: Flecainide: 0.31 ug/mL (ref 0.20–1.00)

## 2017-08-13 ENCOUNTER — Telehealth: Payer: Self-pay

## 2017-08-13 DIAGNOSIS — Z79899 Other long term (current) drug therapy: Secondary | ICD-10-CM

## 2017-08-13 MED ORDER — MAGNESIUM OXIDE 250 MG PO TABS: 250.0000 mg | ORAL_TABLET | Freq: Every day | ORAL | 11 refills | Status: DC

## 2017-08-13 NOTE — Telephone Encounter (Signed)
Notes recorded by Teressa Senter, RN on 08/13/2017 at 3:50 PM EDT Pt made aware lab results. Per Dr. [name] start Magnesium oxide 250 mg daily and repeat Mag and BMET in 1 week. Pt in agreement with plan and thankful for the call. Pt is scheduled for labs on Monday 08/20/17. She was thankful for the call   Notes recorded by Sueanne Margarita, MD on 08/10/2017 at 7:44 PM EDT Magnesium level too low - please start magnesium oxide 250mg  daily and repeat Mag and BMET in 1 week

## 2017-08-15 ENCOUNTER — Telehealth: Payer: Self-pay | Admitting: Cardiology

## 2017-08-15 ENCOUNTER — Ambulatory Visit (HOSPITAL_COMMUNITY)
Admission: RE | Admit: 2017-08-15 | Discharge: 2017-08-15 | Disposition: A | Discharge status: Home / Self Care | Payer: Medicare Other | Source: Ambulatory Visit | Attending: Cardiology | Admitting: Cardiology

## 2017-08-15 DIAGNOSIS — I4510 Unspecified right bundle-branch block: Secondary | ICD-10-CM | POA: Insufficient documentation

## 2017-08-15 DIAGNOSIS — I440 Atrioventricular block, first degree: Secondary | ICD-10-CM | POA: Diagnosis not present

## 2017-08-15 DIAGNOSIS — R002 Palpitations: Secondary | ICD-10-CM | POA: Insufficient documentation

## 2017-08-15 DIAGNOSIS — E7800 Pure hypercholesterolemia, unspecified: Secondary | ICD-10-CM

## 2017-08-15 DIAGNOSIS — I481 Persistent atrial fibrillation: Secondary | ICD-10-CM

## 2017-08-15 DIAGNOSIS — E119 Type 2 diabetes mellitus without complications: Secondary | ICD-10-CM | POA: Diagnosis not present

## 2017-08-15 DIAGNOSIS — I2510 Atherosclerotic heart disease of native coronary artery without angina pectoris: Secondary | ICD-10-CM | POA: Diagnosis not present

## 2017-08-15 DIAGNOSIS — Z6835 Body mass index (BMI) 35.0-35.9, adult: Secondary | ICD-10-CM | POA: Insufficient documentation

## 2017-08-15 DIAGNOSIS — I4819 Other persistent atrial fibrillation: Secondary | ICD-10-CM

## 2017-08-15 DIAGNOSIS — I10 Essential (primary) hypertension: Secondary | ICD-10-CM | POA: Diagnosis not present

## 2017-08-15 DIAGNOSIS — E669 Obesity, unspecified: Secondary | ICD-10-CM | POA: Insufficient documentation

## 2017-08-15 LAB — MYOCARDIAL PERFUSION IMAGING
CHL CUP NUCLEAR SSS: 1
LV sys vol: 41 mL
LVDIAVOL: 106 mL (ref 46–106)
NUC STRESS TID: 1.24
Peak HR: 86 {beats}/min
Rest HR: 61 {beats}/min
SDS: 1
SRS: 0

## 2017-08-15 MED ORDER — TECHNETIUM TC 99M TETROFOSMIN IV KIT
9.9000 | PACK | Freq: Once | INTRAVENOUS | Status: AC | PRN
  Administered 2017-08-15: 9.9 via INTRAVENOUS
  Filled 2017-08-15: qty 10

## 2017-08-15 MED ORDER — REGADENOSON 0.4 MG/5ML IV SOLN
0.4000 mg | Freq: Once | INTRAVENOUS | Status: AC
  Administered 2017-08-15: 0.4 mg via INTRAVENOUS

## 2017-08-15 MED ORDER — TECHNETIUM TC 99M TETROFOSMIN IV KIT
29.7000 | PACK | Freq: Once | INTRAVENOUS | Status: AC | PRN
  Administered 2017-08-15: 29.7 via INTRAVENOUS
  Filled 2017-08-15: qty 30

## 2017-08-15 NOTE — Telephone Encounter (Signed)
New message   Patient requesting to speak with nurse, she has concerns about Dr [name] getting stress test results timely.

## 2017-08-15 NOTE — Telephone Encounter (Signed)
I spoke with pt She would like to know if her stress test results would be back by Monday. I informed pt that Dt. [name] is out of the office this week but she has another doctor covering her box and results should be back by Monday. She stated understanding and thankful for the call

## 2017-08-20 ENCOUNTER — Other Ambulatory Visit: Payer: Medicare Other | Admitting: *Deleted

## 2017-08-20 ENCOUNTER — Ambulatory Visit: Payer: Medicare Other | Admitting: Pharmacist

## 2017-08-20 ENCOUNTER — Telehealth: Payer: Self-pay

## 2017-08-20 ENCOUNTER — Ambulatory Visit (INDEPENDENT_AMBULATORY_CARE_PROVIDER_SITE_OTHER): Payer: Medicare Other

## 2017-08-20 VITALS — BP 142/68 | Resp 16 | Ht 65.0 in | Wt 219.4 lb

## 2017-08-20 DIAGNOSIS — Z5181 Encounter for therapeutic drug level monitoring: Secondary | ICD-10-CM | POA: Diagnosis not present

## 2017-08-20 DIAGNOSIS — I481 Persistent atrial fibrillation: Secondary | ICD-10-CM

## 2017-08-20 DIAGNOSIS — I4819 Other persistent atrial fibrillation: Secondary | ICD-10-CM

## 2017-08-20 DIAGNOSIS — I4891 Unspecified atrial fibrillation: Secondary | ICD-10-CM | POA: Diagnosis not present

## 2017-08-20 DIAGNOSIS — Z79899 Other long term (current) drug therapy: Secondary | ICD-10-CM

## 2017-08-20 DIAGNOSIS — I480 Paroxysmal atrial fibrillation: Secondary | ICD-10-CM

## 2017-08-20 LAB — BASIC METABOLIC PANEL
BUN/Creatinine Ratio: 24 (ref 12–28)
BUN: 18 mg/dL (ref 8–27)
CO2: 22 mmol/L (ref 20–29)
Calcium: 9.5 mg/dL (ref 8.7–10.3)
Chloride: 102 mmol/L (ref 96–106)
Creatinine, Ser: 0.76 mg/dL (ref 0.57–1.00)
GFR, EST AFRICAN AMERICAN: 92 mL/min/{1.73_m2} (ref >59)
GFR, EST NON AFRICAN AMERICAN: 80 mL/min/{1.73_m2} (ref >59)
Glucose: 142 mg/dL — ABNORMAL HIGH (ref 65–99)
Potassium: 4.3 mmol/L (ref 3.5–5.2)
SODIUM: 139 mmol/L (ref 134–144)

## 2017-08-20 LAB — MAGNESIUM: Magnesium: 1.4 mg/dL — ABNORMAL LOW (ref 1.6–2.3)

## 2017-08-20 LAB — POCT INR: INR: 2.4 (ref 2.0–3.0)

## 2017-08-20 MED ORDER — MAGNESIUM OXIDE -MG SUPPLEMENT 500 MG PO CAPS: 500.0000 mg | ORAL_CAPSULE | Freq: Every day | ORAL | 0 refills | Status: DC

## 2017-08-20 NOTE — Progress Notes (Signed)
1.) Reason for visit: EKG  2.) Name of MD requesting visit: Dr.Turner   3.) H&P: pt has h/o persistent afib amd HTN, pt last seen by Dr. [name] on 08/09/17 EKG showed a new Right bundle branch block. Flecainide was reduced from 100 MG BID to 50 mg BID.  4.) ROS related to problem: Pt presented alone in NAD, vitals signs stable, EKG performed.   5.) Assessment and plan per MD: EKG reviewed by DOD (Dr. Burman Foster). He states no changes from previous EKG, continue current med and follow up as scheduled. Pt escorted to coumadin clinic and will have repeat magnesium today. She was in NAD.

## 2017-08-20 NOTE — Patient Instructions (Signed)
Medication Instructions:  Your physician recommends that you continue on your current medications as directed. Please refer to the Current Medication list given to you today.  If you need a refill on your cardiac medications, please contact your pharmacy first.  Labwork: None ordered   Testing/Procedures: None ordered   Follow-Up: You physician would like you to follow up as scheduled.   Any Other Special Instructions Will Be Listed Below (If Applicable).   Thank you for choosing [place], RN  [phone]  If you need a refill on your cardiac medications before your next appointment, please call your pharmacy.

## 2017-08-20 NOTE — Patient Instructions (Signed)
Description   Continue on same dosage 1 tablet every day except 1/2 tablet on Tuesdays, Thursdays, and Saturdays.  Recheck INR in 6 weeks.  Call our office if you have any questions or if placed on any new medications [phone].

## 2017-08-20 NOTE — Telephone Encounter (Signed)
Notes recorded by Teressa Senter, RN on 08/20/2017 at 5:06 PM EDT Pt made aware of lab results. Informed her that Dr.Turner recommends increasing Mag to 500 mg daily and repeat labs in a week. She states she is leaving for vacation on thursday and will return on 7/18, she can come on Friday 08/31/17 for repeat Magnesium level. Pt scheduled for repeat lab on 7/19. She verbalized understanding and thankful for the call   Notes recorded by Sueanne Margarita, MD on 08/20/2017 at 4:44 PM EDT Increase Magnesium oxide to 500mg  daily and repeat mag level in 1 week

## 2017-08-31 ENCOUNTER — Other Ambulatory Visit: Payer: Medicare Other

## 2017-09-03 ENCOUNTER — Other Ambulatory Visit: Payer: Self-pay | Admitting: Cardiology

## 2017-09-06 ENCOUNTER — Other Ambulatory Visit: Payer: Self-pay | Admitting: Cardiology

## 2017-09-07 ENCOUNTER — Telehealth: Payer: Self-pay

## 2017-09-07 NOTE — Telephone Encounter (Signed)
We received a tier exception form for Fenofibrate from [facility] via fax. I have completed the form and faxed it back to [employer].

## 2017-09-13 NOTE — Telephone Encounter (Signed)
Received fax from [facility] denying tier exception for patients FENOFIBRATE. I will complete an appeal letter and fax back to [employer].

## 2017-09-13 NOTE — Telephone Encounter (Signed)
Received a denial from [facility] for tier reduction on FENOFIBRATE, I have sent in an appeal to them.

## 2017-09-26 NOTE — Telephone Encounter (Signed)
We received a letter from [facility] via fax stating that they have approved the pts Fenofibrate tier exception appeal. Approval is valid from 08/08/17 until 02/12/2018   [id number] QG  I have notified the pts pharmacy.

## 2017-10-01 ENCOUNTER — Other Ambulatory Visit: Payer: Self-pay | Admitting: Cardiology

## 2017-10-03 ENCOUNTER — Encounter (INDEPENDENT_AMBULATORY_CARE_PROVIDER_SITE_OTHER): Payer: Self-pay

## 2017-10-03 ENCOUNTER — Ambulatory Visit: Payer: Medicare Other

## 2017-10-03 DIAGNOSIS — I480 Paroxysmal atrial fibrillation: Secondary | ICD-10-CM | POA: Diagnosis not present

## 2017-10-03 DIAGNOSIS — Z5181 Encounter for therapeutic drug level monitoring: Secondary | ICD-10-CM | POA: Diagnosis not present

## 2017-10-03 DIAGNOSIS — I481 Persistent atrial fibrillation: Secondary | ICD-10-CM

## 2017-10-03 DIAGNOSIS — I4819 Other persistent atrial fibrillation: Secondary | ICD-10-CM

## 2017-10-03 LAB — POCT INR: INR: 3.2 — AB (ref 2.0–3.0)

## 2017-10-03 NOTE — Patient Instructions (Signed)
Description   Skip tomorrow's dosage of Coumadin, then resume same dosage 1 tablet every day except 1/2 tablet on Tuesdays, Thursdays, and Saturdays.  Recheck INR in 4 weeks.  Call our office if you have any questions or if placed on any new medications [phone].

## 2017-10-16 DIAGNOSIS — G8929 Other chronic pain: Secondary | ICD-10-CM | POA: Insufficient documentation

## 2017-11-01 ENCOUNTER — Ambulatory Visit: Payer: Medicare Other | Admitting: Pharmacist

## 2017-11-01 DIAGNOSIS — I4819 Other persistent atrial fibrillation: Secondary | ICD-10-CM

## 2017-11-01 DIAGNOSIS — I480 Paroxysmal atrial fibrillation: Secondary | ICD-10-CM

## 2017-11-01 DIAGNOSIS — I481 Persistent atrial fibrillation: Secondary | ICD-10-CM

## 2017-11-01 DIAGNOSIS — Z5181 Encounter for therapeutic drug level monitoring: Secondary | ICD-10-CM

## 2017-11-01 LAB — POCT INR: INR: 3.3 — AB (ref 2.0–3.0)

## 2017-11-01 NOTE — Patient Instructions (Signed)
Description   Skip tomorrow's dosage of Coumadin, then change dosage to 1/2 tablet every day except 1 tablet on Mondays, Wednesdays, and Fridays.  Recheck INR in 3 weeks.  Call our office if you have any questions or if placed on any new medications [phone].

## 2017-11-05 ENCOUNTER — Ambulatory Visit: Payer: Medicare Other | Admitting: Cardiology

## 2017-11-05 ENCOUNTER — Other Ambulatory Visit: Payer: Self-pay | Admitting: *Deleted

## 2017-11-05 ENCOUNTER — Encounter: Payer: Self-pay | Admitting: Cardiology

## 2017-11-05 VITALS — BP 140/64 | HR 65 | Ht 65.0 in | Wt 222.4 lb

## 2017-11-05 DIAGNOSIS — I481 Persistent atrial fibrillation: Secondary | ICD-10-CM

## 2017-11-05 DIAGNOSIS — I4510 Unspecified right bundle-branch block: Secondary | ICD-10-CM | POA: Insufficient documentation

## 2017-11-05 DIAGNOSIS — Z79899 Other long term (current) drug therapy: Secondary | ICD-10-CM

## 2017-11-05 DIAGNOSIS — Z5181 Encounter for therapeutic drug level monitoring: Secondary | ICD-10-CM

## 2017-11-05 DIAGNOSIS — I4819 Other persistent atrial fibrillation: Secondary | ICD-10-CM

## 2017-11-05 DIAGNOSIS — I10 Essential (primary) hypertension: Secondary | ICD-10-CM

## 2017-11-05 HISTORY — DX: Unspecified right bundle-branch block: I45.10

## 2017-11-05 LAB — CBC
Hematocrit: 35.8 % (ref 34.0–46.6)
Hemoglobin: 12 g/dL (ref 11.1–15.9)
MCH: 28.2 pg (ref 26.6–33.0)
MCHC: 33.5 g/dL (ref 31.5–35.7)
MCV: 84 fL (ref 79–97)
PLATELETS: 256 10*3/uL (ref 150–450)
RBC: 4.26 x10E6/uL (ref 3.77–5.28)
RDW: 14 % (ref 12.3–15.4)
WBC: 5.6 10*3/uL (ref 3.4–10.8)

## 2017-11-05 LAB — MAGNESIUM: Magnesium: 1.3 mg/dL — ABNORMAL LOW (ref 1.6–2.3)

## 2017-11-05 NOTE — Progress Notes (Signed)
Cardiology Office Note:    Date:  11/05/2017   ID:  [name], DOB [birth year], MRN [id number]  PCP:  Kelton Pillar, MD  Cardiologist:  No primary care provider on file.    Referring MD: Kelton Pillar, MD   Chief Complaint  Patient presents with  . Atrial Fibrillation  . Hypertension    History of Present Illness:    Latasha Hodges is a 72 y.o. female with a hx of persistent atrial fibrillationand HTN.    She is here today because she is concerned about upcoming oral surgery that she has to have done after losing her front tooth.  Since I saw her last she had a new right bundle branch block on exam and it was felt to be due to aging of the conduction system but her flecainide was decreased to 50 mg twice daily.  Flecainide level was normal at 0.31.  She says she has been having some palpitations at night but she is not convinced they are from atrial fibrillation.  She denies any chest pain or pressure, shortness of breath, PND, orthopnea, lower extremity edema.   Past Medical History:  Diagnosis Date  . Bronchitis, chronic, simple ([place])   . Diabetes mellitus   . Hyperlipidemia   . Hypertension   . Obesity   . Persistent atrial fibrillation ([place])   . RBBB (right bundle branch block) 11/05/2017    Past Surgical History:  Procedure Laterality Date  . COLONOSCOPY  12/29/2010   Procedure: COLONOSCOPY;  Surgeon: Owens Loffler, MD;  Location: [facility];  Service: Endoscopy;  Laterality: N/A;  . TUBAL LIGATION  1980    Current Medications: Current Meds  Medication Sig  . fenofibrate micronized (LOFIBRA) 200 MG capsule TAKE 1 CAPSULE BY MOUTH DAILY BEFORE BREAKFAST  . flecainide (TAMBOCOR) 50 MG tablet Take 1 tablet (50 mg total) by mouth 2 (two) times daily.  Marland Kitchen lisinopril (PRINIVIL,ZESTRIL) 20 MG tablet Take 1 tablet (20 mg total) by mouth daily.  . Magnesium 250 MG TABS Take 2 tablets by mouth daily.  . Magnesium Oxide 250 MG TABS Take 1 tablet (250 mg total)  by mouth daily.  . metFORMIN (GLUCOPHAGE) 1000 MG tablet Take 1,000 mg by mouth 2 (two) times daily.  . metoprolol succinate (TOPROL-XL) 50 MG 24 hr tablet TAKE 1 TABLET BY MOUTH DAILY WITH FOOD  . ONGLYZA 5 MG TABS tablet Take 5 mg by mouth daily.  . simvastatin (ZOCOR) 40 MG tablet Take 1 tablet (40 mg total) by mouth daily at 6 PM.  . TRULICITY 5.44 BE/0.1EO SOPN Inject 0.75 mg as directed daily.   Marland Kitchen warfarin (COUMADIN) 5 MG tablet TAKE AS DIRECTED BY CLINIC     Allergies:   Iodinated diagnostic agents; Penicillins; and Sulfa drugs cross reactors   Social History   Socioeconomic History  . Marital status: Divorced    Spouse name: Not on file  . Number of children: 2  . Years of education: Not on file  . Highest education level: Not on file  Occupational History  . Occupation: ADMIN ASSISTANT  Social Needs  . Financial resource strain: Not on file  . Food insecurity:    Worry: Not on file    Inability: Not on file  . Transportation needs:    Medical: Not on file    Non-medical: Not on file  Tobacco Use  . Smoking status: Former Smoker    Last attempt to quit: 12/28/1981    Years since quitting: 35.8  . Smokeless  tobacco: Never Used  Substance and Sexual Activity  . Alcohol use: No  . Drug use: No  . Sexual activity: Never    Birth control/protection: Post-menopausal  Lifestyle  . Physical activity:    Days per week: Not on file    Minutes per session: Not on file  . Stress: Not on file  Relationships  . Social connections:    Talks on phone: Not on file    Gets together: Not on file    Attends religious service: Not on file    Active member of club or organization: Not on file    Attends meetings of clubs or organizations: Not on file    Relationship status: Not on file  Other Topics Concern  . Not on file  Social History Narrative   0 caffeine drinks daily      Family History: The patient's family history includes Cancer in her mother; Colonic polyp in  her cousin; Diabetes in her father; Hypertension in her father. There is no history of Colon cancer.  ROS:   Please see the history of present illness.    ROS  All other systems reviewed and negative.   EKGs/Labs/Other Studies Reviewed:    The following studies were reviewed today: none  EKG:  EKG is not ordered today.    Recent Labs: 08/20/2017: BUN 18; Creatinine, Ser 0.76; Magnesium 1.4; Potassium 4.3; Sodium 139   Recent Lipid Panel    Component Value Date/Time   CHOL 180 08/12/2014 0805   TRIG (H) 08/12/2014 0805    533.0 Triglyceride is over 400; calculations on Lipids are invalid.   HDL 34.20 (L) 08/12/2014 0805   CHOLHDL 5 08/12/2014 0805   LDLDIRECT 69.0 08/12/2014 0805    Physical Exam:    VS:  BP 140/64   Pulse 65   Ht 5\' 5"  (1.651 m)   Wt 222 lb 6.4 oz (100.9 kg)   SpO2 95%   BMI 37.01 kg/m     Wt Readings from Last 3 Encounters:  11/05/17 222 lb 6.4 oz (100.9 kg)  08/20/17 219 lb 6.4 oz (99.5 kg)  08/15/17 216 lb (98 kg)     GEN:  Well nourished, well developed in no acute distress HEENT: Normal NECK: No JVD; No carotid bruits LYMPHATICS: No lymphadenopathy CARDIAC: RRR, no murmurs, rubs, gallops RESPIRATORY:  Clear to auscultation without rales, wheezing or rhonchi  ABDOMEN: Soft, non-tender, non-distended MUSCULOSKELETAL:  No edema; No deformity  SKIN: Warm and dry NEUROLOGIC:  Alert and oriented x 3 PSYCHIATRIC:  Normal affect   ASSESSMENT:    1. Persistent atrial fibrillation ([place])   2. Essential hypertension   3. Encounter for therapeutic drug monitoring   4. RBBB (right bundle branch block)    PLAN:    In order of problems listed above:  1.  Persistent atrial fibrillation -maintaining normal sinus rhythm on exam.  She has been having some palpitations but only at night and she is not convinced it is her atrial fibrillation.  I will get an event monitor to assess for recurrent atrial fibrillation on the lower dose of flecainide.  She  will continue on Toprol-XL 50 mg daily, flecainide 50 mg twice daily and warfarin.  I will check hemoglobin since this is not been done in the past year.  She would also like her magnesium checked today since is been running low and she had some palpitations at night.  In regards to her dental procedure I told her she was fine  to have her undergo her dental procedure.  Her oral surgeon will need to contact us for instructions on her Coumadin if she needs to be off of that anticoagulation.  As long as event monitor shows no arrhythmias I think she is okay to proceed with Novocain during her surgery.  2.  HTN -BP is well controlled on exam today.  She will continue on Toprol XL 50 mg daily and lisinopril 20 mg daily.  3.  Right bundle branch block -QRS duration improved after decreasing dose of flecainide from 166 to 156 ms.  Nuclear stress test 08/15/2017 showed no ischemia.   Medication Adjustments/Labs and Tests Ordered: Current medicines are reviewed at length with the patient today.  Concerns regarding medicines are outlined above.  Orders Placed This Encounter  Procedures  . CBC  . CARDIAC EVENT MONITOR   No orders of the defined types were placed in this encounter.   Signed, Fransico Him, MD  11/05/2017 8:35 AM    [place]

## 2017-11-05 NOTE — Patient Instructions (Signed)
Medication Instructions:  Your physician recommends that you continue on your current medications as directed. Please refer to the Current Medication list given to you today.   Labwork: Today: CBC and Magnesium   Testing/Procedures: Your physician has recommended that you wear an event monitor. Event monitors are medical devices that record the heart's electrical activity. Doctors most often [place] these monitors to diagnose arrhythmias. Arrhythmias are problems with the speed or rhythm of the heartbeat. The monitor is a small, portable device. You can wear one while you do your normal daily activities. This is usually used to diagnose what is causing palpitations/syncope (passing out).  Follow-Up: Your physician wants you to follow-up in: 6 months with Dr. [name]. You will receive a reminder letter in the mail two months in advance. If you don't receive a letter, please call our office to schedule the follow-up appointment.   Any Other Special Instructions Will Be Listed Below (If Applicable).     If you need a refill on your cardiac medications before your next appointment, please call your pharmacy.

## 2017-11-06 ENCOUNTER — Other Ambulatory Visit: Payer: Self-pay

## 2017-11-06 DIAGNOSIS — R790 Abnormal level of blood mineral: Secondary | ICD-10-CM

## 2017-11-06 DIAGNOSIS — Z79899 Other long term (current) drug therapy: Secondary | ICD-10-CM

## 2017-11-06 DIAGNOSIS — Z5181 Encounter for therapeutic drug level monitoring: Secondary | ICD-10-CM

## 2017-11-06 DIAGNOSIS — I480 Paroxysmal atrial fibrillation: Secondary | ICD-10-CM

## 2017-11-06 MED ORDER — MAGNESIUM OXIDE -MG SUPPLEMENT 500 MG PO TABS: 500.0000 mg | ORAL_TABLET | Freq: Two times a day (BID) | ORAL | Status: DC

## 2017-11-06 MED ORDER — MAGNESIUM OXIDE -MG SUPPLEMENT 500 MG PO TABS: 500.0000 mg | ORAL_TABLET | Freq: Two times a day (BID) | ORAL | 3 refills | Status: DC

## 2017-11-15 ENCOUNTER — Other Ambulatory Visit: Payer: Medicare Other

## 2017-11-16 ENCOUNTER — Other Ambulatory Visit: Payer: Medicare Other | Admitting: *Deleted

## 2017-11-16 DIAGNOSIS — Z5181 Encounter for therapeutic drug level monitoring: Secondary | ICD-10-CM

## 2017-11-16 DIAGNOSIS — R790 Abnormal level of blood mineral: Secondary | ICD-10-CM

## 2017-11-16 DIAGNOSIS — Z79899 Other long term (current) drug therapy: Secondary | ICD-10-CM

## 2017-11-16 DIAGNOSIS — I480 Paroxysmal atrial fibrillation: Secondary | ICD-10-CM

## 2017-11-16 LAB — MAGNESIUM: MAGNESIUM: 1.3 mg/dL — AB (ref 1.6–2.3)

## 2017-11-20 ENCOUNTER — Telehealth: Payer: Self-pay | Admitting: Cardiology

## 2017-11-20 ENCOUNTER — Ambulatory Visit: Payer: Medicare Other | Admitting: *Deleted

## 2017-11-20 DIAGNOSIS — Z5181 Encounter for therapeutic drug level monitoring: Secondary | ICD-10-CM

## 2017-11-20 DIAGNOSIS — I480 Paroxysmal atrial fibrillation: Secondary | ICD-10-CM | POA: Diagnosis not present

## 2017-11-20 DIAGNOSIS — R790 Abnormal level of blood mineral: Secondary | ICD-10-CM

## 2017-11-20 DIAGNOSIS — I4819 Other persistent atrial fibrillation: Secondary | ICD-10-CM | POA: Diagnosis not present

## 2017-11-20 LAB — POCT INR: INR: 3.1 — AB (ref 2.0–3.0)

## 2017-11-20 MED ORDER — MAGNESIUM OXIDE -MG SUPPLEMENT 500 MG PO TABS: 500.0000 mg | ORAL_TABLET | Freq: Three times a day (TID) | ORAL | Status: DC

## 2017-11-20 NOTE — Telephone Encounter (Signed)
New message:      Pt is calling and states to tell the RN she is already currently taking 500 mg  Magnesu Oxide. Pt states she will be unavailable from 3-6:30 today if she could please receive a call back before then.

## 2017-11-20 NOTE — Telephone Encounter (Signed)
Follow Up ° ° ° ° °Returning your call from yesterday, concerning her lab results. °

## 2017-11-20 NOTE — Telephone Encounter (Signed)
Spoke with the patient about starting 500 mg TID, she accepted and is coming on 10/16 for labs.   Notes recorded by Sueanne Margarita, MD on 11/20/2017 at 1:29 PM EDT Increase to Mag oxide 500mg  TID and repeat Mag level in 1 week

## 2017-11-20 NOTE — Patient Instructions (Signed)
Description   Skip today's dosage of Coumadin, then change dosage to 1/2 tablet every day except 1 tablet on Mondays, Wednesdays, and Fridays.  Recheck INR in 3 weeks.  Call our office if you have any questions or if placed on any new medications [phone].

## 2017-11-20 NOTE — Telephone Encounter (Signed)
See other note

## 2017-11-26 ENCOUNTER — Telehealth: Payer: Self-pay

## 2017-11-26 NOTE — Telephone Encounter (Signed)
   Primary Cardiologist:No primary care provider on file.  Chart reviewed as part of pre-operative protocol coverage.   Request is for anticoagulation management only.  Will route to [facility] for recommendations regarding anticoagulation.  This note will be removed from the preop pool.  Richardson Dopp, PA-C  11/26/2017, 4:43 PM

## 2017-11-26 NOTE — Telephone Encounter (Signed)
   [facility] Pre-operative Risk Assessment    Request for surgical clearance:  1. What type of surgery is being performed? Few extractions and laser surgery using a YAG laser to correct her periodontal oral infection.  2. When is this surgery scheduled?  Next week   3. What type of clearance is required (medical clearance vs. Pharmacy clearance to hold med vs. Both)? Pharmacy   4. Are there any medications that need to be held prior to surgery and how long? Coumadin   5. Practice name and name of physician performing surgery? [facility], Dr. Delma Officer   6. What is your office phone number [phone]   7.   What is your office fax number  [phone]  8.   Anesthesia type (None, local, MAC, general) ? Local anesthesia with epinephrine  Latasha Hodges 11/26/2017, 11:38 AM  _________________________________________________________________

## 2017-11-27 NOTE — Telephone Encounter (Signed)
Reviewed last note from Dr. [name].  Patient was to get an event monitor for palpitations.  Recommendation at that time was that as long as no arrhythmias were noted on the event monitor, she could proceed with the procedure with novocain.  Event monitor not done.  Will route to Dr. [name] to get input regarding upcoming dental extraction.  Dr. [name]:  Does she need the event monitor done before she has dental extraction? Richardson Dopp, PA-C    11/27/2017 3:50 PM

## 2017-11-27 NOTE — Telephone Encounter (Addendum)
Pt takes warfarin for afib with CHADS2VASc score of 4 (age, sex, HTN, DM). Would see if dentist office is ok with pt holding warfarin for 2-3 days prior to a few dental extractions.

## 2017-11-28 ENCOUNTER — Other Ambulatory Visit: Payer: Medicare Other

## 2017-11-28 ENCOUNTER — Telehealth: Payer: Self-pay | Admitting: Cardiology

## 2017-11-28 ENCOUNTER — Encounter: Payer: Self-pay | Admitting: Physician Assistant

## 2017-11-28 NOTE — Telephone Encounter (Signed)
    Call back staff:  Marland Kitchen Clearance letter has been faxed to the requesting surgeon. . Please contact the surgeon's office to ensure it has been received. . Please contact the patient at [phone] to let her know the clearance was sent to her dentist.  Make sure she knows to hold Coumadin for 3 days prior to her extractions.  She will need to resume it as soon as the dentist says it is ok.  This phone note will be removed from the preop pool. Richardson Dopp, PA-C    11/28/2017 1:52 PM

## 2017-11-28 NOTE — Telephone Encounter (Signed)
Clearance letter has been recv'd. I will remove from the call back pool.

## 2017-11-28 NOTE — Telephone Encounter (Signed)
See 10.14.19 phone note. Richardson Dopp, PA-C    11/28/2017 2:06 PM

## 2017-11-28 NOTE — Telephone Encounter (Signed)
I think she is fine to get dental procedure but let dentist know that if he uses novicaine there is higher risk of PAF

## 2017-11-28 NOTE — Telephone Encounter (Signed)
Latasha Hodges        Patient is calling to check the status on the clearance for dentist. Patient is asking that we send the information to the dentist for her appt on 12/03/2017. Patient is asking for a call  When it's [phone]

## 2017-11-29 ENCOUNTER — Other Ambulatory Visit: Payer: Medicare Other

## 2017-11-29 ENCOUNTER — Telehealth: Payer: Self-pay | Admitting: Cardiology

## 2017-11-29 NOTE — Telephone Encounter (Signed)
What questions dentist have?  Patient is already cleared at acceptable risk. She is higher risk of PAF if using Novicaine.

## 2017-11-29 NOTE — Telephone Encounter (Signed)
  Dentist has additional questions regarding novacaine

## 2017-11-29 NOTE — Telephone Encounter (Signed)
error 

## 2017-11-29 NOTE — Telephone Encounter (Signed)
Spoke with Mendel Ryder @ [facility]. She is needing clarification that if they can't use Novacaine, what can they use. She said they normally use Lidocaine with Epinephrine.  If they need to use something w/out epi in it, they could. Please advise! Will fwd back to preop pool and Dr. [name].

## 2017-11-30 ENCOUNTER — Other Ambulatory Visit: Payer: Medicare Other | Admitting: *Deleted

## 2017-11-30 DIAGNOSIS — R790 Abnormal level of blood mineral: Secondary | ICD-10-CM

## 2017-11-30 LAB — MAGNESIUM: Magnesium: 1.5 mg/dL — ABNORMAL LOW (ref 1.6–2.3)

## 2017-12-02 NOTE — Telephone Encounter (Signed)
The can use novacaine but would avoid epi with history of PAF

## 2017-12-03 NOTE — Telephone Encounter (Signed)
Spoke with Dr. Shireen Quan office.  They have informed me that pt already had her procedure this morning, but they didn't use anything with Epi in it.

## 2017-12-04 ENCOUNTER — Other Ambulatory Visit: Payer: Self-pay

## 2017-12-04 DIAGNOSIS — R790 Abnormal level of blood mineral: Secondary | ICD-10-CM

## 2017-12-04 NOTE — Progress Notes (Signed)
Order put in for Kidney referral per Dr. [name] for low magnesium levels.

## 2017-12-13 ENCOUNTER — Ambulatory Visit: Payer: Medicare Other | Admitting: *Deleted

## 2017-12-13 DIAGNOSIS — I480 Paroxysmal atrial fibrillation: Secondary | ICD-10-CM

## 2017-12-13 DIAGNOSIS — Z5181 Encounter for therapeutic drug level monitoring: Secondary | ICD-10-CM

## 2017-12-13 DIAGNOSIS — I4819 Other persistent atrial fibrillation: Secondary | ICD-10-CM

## 2017-12-13 LAB — POCT INR: INR: 2.3 (ref 2.0–3.0)

## 2017-12-13 NOTE — Patient Instructions (Signed)
Description   Continue taking 1/2 tablet every day except 1 tablet on Mondays, Wednesdays, and Fridays.  Recheck INR in 4 weeks.  Call our office if you have any questions or if placed on any new medications [phone].

## 2017-12-18 ENCOUNTER — Ambulatory Visit (INDEPENDENT_AMBULATORY_CARE_PROVIDER_SITE_OTHER): Payer: Medicare Other

## 2017-12-18 DIAGNOSIS — I4819 Other persistent atrial fibrillation: Secondary | ICD-10-CM | POA: Diagnosis not present

## 2018-01-06 ENCOUNTER — Other Ambulatory Visit: Payer: Self-pay | Admitting: Cardiology

## 2018-01-09 ENCOUNTER — Ambulatory Visit (INDEPENDENT_AMBULATORY_CARE_PROVIDER_SITE_OTHER): Payer: Medicare Other

## 2018-01-09 DIAGNOSIS — Z5181 Encounter for therapeutic drug level monitoring: Secondary | ICD-10-CM | POA: Diagnosis not present

## 2018-01-09 DIAGNOSIS — I4819 Other persistent atrial fibrillation: Secondary | ICD-10-CM | POA: Diagnosis not present

## 2018-01-09 DIAGNOSIS — I480 Paroxysmal atrial fibrillation: Secondary | ICD-10-CM | POA: Diagnosis not present

## 2018-01-09 LAB — POCT INR: INR: 2 (ref 2.0–3.0)

## 2018-01-09 NOTE — Patient Instructions (Signed)
Description   Take an extra 1/2 tablet today, then resume same dosage 1/2 tablet every day except 1 tablet on Mondays, Wednesdays, and Fridays.  Recheck INR in 5 weeks.  Call our office if you have any questions or if placed on any new medications [phone].

## 2018-01-28 ENCOUNTER — Ambulatory Visit: Payer: Medicare Other | Admitting: Cardiology

## 2018-02-15 ENCOUNTER — Ambulatory Visit: Payer: Medicare Other | Admitting: *Deleted

## 2018-02-15 ENCOUNTER — Encounter (INDEPENDENT_AMBULATORY_CARE_PROVIDER_SITE_OTHER): Payer: Self-pay

## 2018-02-15 DIAGNOSIS — Z5181 Encounter for therapeutic drug level monitoring: Secondary | ICD-10-CM | POA: Diagnosis not present

## 2018-02-15 DIAGNOSIS — I480 Paroxysmal atrial fibrillation: Secondary | ICD-10-CM

## 2018-02-15 DIAGNOSIS — I4819 Other persistent atrial fibrillation: Secondary | ICD-10-CM

## 2018-02-15 LAB — POCT INR: INR: 2.4 (ref 2.0–3.0)

## 2018-02-15 NOTE — Patient Instructions (Signed)
Description   Continue your  same dosage 1/2 tablet every day except 1 tablet on Mondays, Wednesdays, and Fridays.  Recheck INR in 6 weeks.  Call our office if you have any questions or if placed on any new medications [phone].

## 2018-03-27 ENCOUNTER — Encounter (INDEPENDENT_AMBULATORY_CARE_PROVIDER_SITE_OTHER): Payer: Self-pay

## 2018-03-27 ENCOUNTER — Ambulatory Visit (INDEPENDENT_AMBULATORY_CARE_PROVIDER_SITE_OTHER): Payer: Medicare Other | Admitting: Pharmacist

## 2018-03-27 DIAGNOSIS — I480 Paroxysmal atrial fibrillation: Secondary | ICD-10-CM

## 2018-03-27 DIAGNOSIS — I4819 Other persistent atrial fibrillation: Secondary | ICD-10-CM | POA: Diagnosis not present

## 2018-03-27 DIAGNOSIS — Z5181 Encounter for therapeutic drug level monitoring: Secondary | ICD-10-CM | POA: Diagnosis not present

## 2018-03-27 LAB — POCT INR: INR: 3.9 — AB (ref 2.0–3.0)

## 2018-03-27 NOTE — Patient Instructions (Signed)
Hold coumadin today. Then continue your same dosage 1/2 tablet every day except 1 tablet on Mondays, Wednesdays, and Fridays.  Hold coumadin Friday, Sat and Sunday. Resume coumadin Monday night after dental procedure. Resume eating your normal amount of greens per week. Recheck INR on 2/27.

## 2018-03-28 ENCOUNTER — Telehealth: Payer: Self-pay | Admitting: *Deleted

## 2018-03-28 NOTE — Telephone Encounter (Signed)
   [facility] Pre-operative Risk Assessment    Request for surgical clearance:  1. What type of surgery is being performed? TOOTH EXTRACTIONS W/2 DENTAL IMPLANTS AND TISSUE GRAFT FROM PALATE    2. When is this surgery scheduled? TBD   3. What type of clearance is required (medical clearance vs. Pharmacy clearance to hold med vs. Both)? BOTH  4. Are there any medications that need to be held prior to surgery and how long?WARAFRIN HOLD 3 DAYS PRIOR AND WILL RESUME THE NIGHT OF OR DAY AFTER OF PROCEDURE   5. Practice name and name of physician performing surgery? [facility]; DR. Delma Officer, DDS, [place]   6. What is your office phone number [phone]    7.   What is your office fax number [phone]  8.   Anesthesia type (None, local, MAC, general) ? LIDOCAINE W/EPI   Julaine Hua 03/28/2018, 12:59 PM  _________________________________________________________________   (provider comments below)

## 2018-03-28 NOTE — Telephone Encounter (Signed)
INR followed by [facility] coumadin clinic, INR supratherapeutic yesterday, instructed by coumadin clinic to hold coumadin until 2/17. Next visit to coumadin clinic 2/27.

## 2018-03-28 NOTE — Telephone Encounter (Signed)
Pt takes warfarin for afib with CHADS2VASc score of 4 (age, sex, HTN, DM). She was cleared to hold warfarin for 3 days prior to dental work at INR check yesterday.

## 2018-03-29 NOTE — Telephone Encounter (Signed)
   Primary Cardiologist: Fransico Him, MD  Chart reviewed as part of pre-operative protocol coverage. Patient was contacted 03/29/2018 in reference to pre-operative risk assessment for pending surgery as outlined below.  Taytum Scheck was last seen on 11/05/17 by Dr. [name].  Since that day, Shante Archambeault has done well. She denies any changes to her medical history and denies anginal symptoms since her last clinic visit. She was cleared to stop coumadin prior to surgery at her last INR check 2 days ago. She is aware of her instructions for coumadin.   Per pharmacy staff: Pt takes warfarin for afib with CHADS2VASc score of 4 (age, sex, HTN, DM). She was cleared to hold warfarin for 3 days prior to dental work at INR check yesterday.   NO EPI should be used in this patient.  Therefore, based on ACC/AHA guidelines, the patient would be at acceptable risk for the planned procedure without further cardiovascular testing.   I will route this recommendation to the requesting party via Epic fax function and remove from pre-op pool.  Please call with questions.  Tami Lin Duke, PA 03/29/2018, 8:34 AM

## 2018-04-11 ENCOUNTER — Ambulatory Visit: Payer: Medicare Other | Admitting: Pharmacist

## 2018-04-11 DIAGNOSIS — I4819 Other persistent atrial fibrillation: Secondary | ICD-10-CM

## 2018-04-11 DIAGNOSIS — I480 Paroxysmal atrial fibrillation: Secondary | ICD-10-CM | POA: Diagnosis not present

## 2018-04-11 DIAGNOSIS — Z5181 Encounter for therapeutic drug level monitoring: Secondary | ICD-10-CM | POA: Diagnosis not present

## 2018-04-11 LAB — POCT INR: INR: 3.7 — AB (ref 2.0–3.0)

## 2018-04-11 NOTE — Patient Instructions (Signed)
Skip coumadin today, then continue your same dosage 1/2 tablet every day except 1 tablet on Mondays, Wednesdays, and Fridays.  Resume eating your normal amount of greens per week. Recheck in 2 weeks. Call our office if you have any questions or if placed on any new medications [phone].

## 2018-04-26 ENCOUNTER — Other Ambulatory Visit: Payer: Self-pay

## 2018-04-26 ENCOUNTER — Ambulatory Visit (INDEPENDENT_AMBULATORY_CARE_PROVIDER_SITE_OTHER): Payer: Medicare Other | Admitting: Pharmacist

## 2018-04-26 DIAGNOSIS — I4819 Other persistent atrial fibrillation: Secondary | ICD-10-CM | POA: Diagnosis not present

## 2018-04-26 DIAGNOSIS — I480 Paroxysmal atrial fibrillation: Secondary | ICD-10-CM | POA: Diagnosis not present

## 2018-04-26 DIAGNOSIS — Z5181 Encounter for therapeutic drug level monitoring: Secondary | ICD-10-CM | POA: Diagnosis not present

## 2018-04-26 LAB — POCT INR: INR: 2.4 (ref 2.0–3.0)

## 2018-04-26 NOTE — Patient Instructions (Signed)
Description   Continue your same dosage 1/2 tablet every day except 1 tablet on Mondays, Wednesdays, and Fridays.  Resume eating your normal amount of greens per week. Recheck in 4 weeks. Call our office if you have any questions or if placed on any new medications [phone].    

## 2018-05-09 ENCOUNTER — Other Ambulatory Visit: Payer: Self-pay | Admitting: Cardiology

## 2018-05-15 ENCOUNTER — Other Ambulatory Visit: Payer: Self-pay | Admitting: Cardiology

## 2018-05-15 DIAGNOSIS — I10 Essential (primary) hypertension: Secondary | ICD-10-CM

## 2018-05-15 DIAGNOSIS — I4819 Other persistent atrial fibrillation: Secondary | ICD-10-CM

## 2018-05-15 DIAGNOSIS — I4510 Unspecified right bundle-branch block: Secondary | ICD-10-CM

## 2018-05-15 DIAGNOSIS — E7800 Pure hypercholesterolemia, unspecified: Secondary | ICD-10-CM

## 2018-05-21 ENCOUNTER — Telehealth: Payer: Self-pay | Admitting: Cardiology

## 2018-05-21 ENCOUNTER — Telehealth: Payer: Self-pay

## 2018-05-21 NOTE — Telephone Encounter (Signed)
This encounter was created in error - please disregard.

## 2018-05-21 NOTE — Telephone Encounter (Signed)

## 2018-05-21 NOTE — Telephone Encounter (Signed)
New Message   Patient returning call about appointment.

## 2018-05-21 NOTE — Addendum Note (Signed)
Addended by: Allean Found on: 05/21/2018 10:56 AM   Modules accepted: Level of Service, SmartSet

## 2018-05-21 NOTE — Telephone Encounter (Signed)
1. Do you currently have a fever? No 2. Have you recently travelled on a cruise, internationally, or to [place], [place], [place], [place], [place], or [place], [place] [employer]) ? No 3. Have you been in contact with someone that is currently pending confirmation of Covid19 testing or has been confirmed to have the [place] virus?  No Are you currently experiencing fatigue or cough? No  Pt. Advised that we are restricting visitors at this time and anyone present in the vehicle should meet the above criteria as well. Advised that visit will be at curbside for finger stick ONLY and will receive call with instructions. Pt also advised to please bring own pen for signature of arrival document.

## 2018-05-22 ENCOUNTER — Ambulatory Visit (INDEPENDENT_AMBULATORY_CARE_PROVIDER_SITE_OTHER): Payer: Medicare Other | Admitting: Pharmacist

## 2018-05-22 ENCOUNTER — Other Ambulatory Visit: Payer: Self-pay

## 2018-05-22 DIAGNOSIS — Z5181 Encounter for therapeutic drug level monitoring: Secondary | ICD-10-CM | POA: Diagnosis not present

## 2018-05-22 DIAGNOSIS — I4819 Other persistent atrial fibrillation: Secondary | ICD-10-CM

## 2018-05-22 DIAGNOSIS — I480 Paroxysmal atrial fibrillation: Secondary | ICD-10-CM

## 2018-05-22 LAB — POCT INR: INR: 2.5 (ref 2.0–3.0)

## 2018-05-22 NOTE — Patient Instructions (Signed)
Description   Continue your same dosage 1/2 tablet every day except 1 tablet on Mondays, Wednesdays, and Fridays.  Resume eating your normal amount of greens per week. Recheck in 4 weeks. Call our office if you have any questions or if placed on any new medications [phone].

## 2018-06-23 ENCOUNTER — Other Ambulatory Visit: Payer: Self-pay | Admitting: Cardiology

## 2018-07-03 ENCOUNTER — Other Ambulatory Visit: Payer: Self-pay | Admitting: Cardiology

## 2018-07-09 ENCOUNTER — Telehealth: Payer: Self-pay

## 2018-07-09 NOTE — Telephone Encounter (Signed)

## 2018-07-10 ENCOUNTER — Ambulatory Visit (INDEPENDENT_AMBULATORY_CARE_PROVIDER_SITE_OTHER): Payer: Medicare Other | Admitting: *Deleted

## 2018-07-10 ENCOUNTER — Other Ambulatory Visit: Payer: Self-pay

## 2018-07-10 DIAGNOSIS — I480 Paroxysmal atrial fibrillation: Secondary | ICD-10-CM

## 2018-07-10 DIAGNOSIS — Z5181 Encounter for therapeutic drug level monitoring: Secondary | ICD-10-CM | POA: Diagnosis not present

## 2018-07-10 DIAGNOSIS — I4819 Other persistent atrial fibrillation: Secondary | ICD-10-CM

## 2018-07-10 LAB — POCT INR: INR: 2.6 (ref 2.0–3.0)

## 2018-07-10 NOTE — Patient Instructions (Signed)
Description   Spoke with pt and instructed her to continue same dosage of 1/2 tablet every day except 1 tablet on Mondays, Wednesdays, and Fridays.  Continue eating your normal amount of greens per week. Recheck in 8 weeks. Call our office if you have any questions or if placed on any new medications [phone].

## 2018-09-02 ENCOUNTER — Telehealth: Payer: Self-pay

## 2018-09-02 ENCOUNTER — Other Ambulatory Visit: Payer: Self-pay | Admitting: Cardiology

## 2018-09-02 NOTE — Telephone Encounter (Signed)

## 2018-09-05 ENCOUNTER — Ambulatory Visit (INDEPENDENT_AMBULATORY_CARE_PROVIDER_SITE_OTHER): Payer: Medicare Other | Admitting: *Deleted

## 2018-09-05 ENCOUNTER — Other Ambulatory Visit: Payer: Self-pay

## 2018-09-05 DIAGNOSIS — I480 Paroxysmal atrial fibrillation: Secondary | ICD-10-CM

## 2018-09-05 DIAGNOSIS — Z5181 Encounter for therapeutic drug level monitoring: Secondary | ICD-10-CM | POA: Diagnosis not present

## 2018-09-05 LAB — POCT INR: INR: 2.5 (ref 2.0–3.0)

## 2018-09-05 NOTE — Patient Instructions (Signed)
Description   Continue same dosage of 1/2 tablet every day except 1 tablet on Mondays, Wednesdays, and Fridays.  Continue eating your normal amount of greens per week. Recheck in 8 weeks. Call our office if you have any questions or if placed on any new medications [phone].

## 2018-09-14 ENCOUNTER — Other Ambulatory Visit: Payer: Self-pay | Admitting: Cardiology

## 2018-09-18 ENCOUNTER — Other Ambulatory Visit: Payer: Self-pay | Admitting: Cardiology

## 2018-10-31 ENCOUNTER — Other Ambulatory Visit: Payer: Self-pay

## 2018-10-31 ENCOUNTER — Encounter (INDEPENDENT_AMBULATORY_CARE_PROVIDER_SITE_OTHER): Payer: Self-pay

## 2018-10-31 ENCOUNTER — Ambulatory Visit (INDEPENDENT_AMBULATORY_CARE_PROVIDER_SITE_OTHER): Payer: Medicare Other | Admitting: *Deleted

## 2018-10-31 DIAGNOSIS — I480 Paroxysmal atrial fibrillation: Secondary | ICD-10-CM

## 2018-10-31 DIAGNOSIS — Z5181 Encounter for therapeutic drug level monitoring: Secondary | ICD-10-CM | POA: Diagnosis not present

## 2018-10-31 LAB — POCT INR: INR: 2.1 (ref 2.0–3.0)

## 2018-10-31 NOTE — Patient Instructions (Signed)
Description   Continue same dosage of 1/2 tablet every day except 1 tablet on Mondays, Wednesdays, and Fridays.  Continue eating your normal amount of greens per week. Recheck in 8 weeks. Call our office if you have any questions or if placed on any new medications [phone].    

## 2018-12-26 ENCOUNTER — Other Ambulatory Visit: Payer: Self-pay

## 2018-12-26 ENCOUNTER — Ambulatory Visit: Payer: Medicare Other | Admitting: *Deleted

## 2018-12-26 DIAGNOSIS — I480 Paroxysmal atrial fibrillation: Secondary | ICD-10-CM

## 2018-12-26 DIAGNOSIS — Z5181 Encounter for therapeutic drug level monitoring: Secondary | ICD-10-CM

## 2018-12-26 LAB — POCT INR: INR: 1.9 — AB (ref 2.0–3.0)

## 2018-12-26 NOTE — Patient Instructions (Signed)
Description    Take an extra 1/2 tablet today, then Continue same dosage of 1/2 tablet every day except 1 tablet on Mondays, Wednesdays, and Fridays. Recheck in 4 weeks. Call our office if you have any questions or if placed on any new medications [phone].

## 2018-12-30 ENCOUNTER — Other Ambulatory Visit: Payer: Self-pay

## 2018-12-30 ENCOUNTER — Other Ambulatory Visit: Payer: Self-pay | Admitting: Cardiology

## 2018-12-30 MED ORDER — LISINOPRIL 20 MG PO TABS: ORAL_TABLET | ORAL | 0 refills | Status: DC

## 2019-01-08 ENCOUNTER — Other Ambulatory Visit: Payer: Self-pay | Admitting: Cardiology

## 2019-01-14 ENCOUNTER — Other Ambulatory Visit: Payer: Self-pay | Admitting: Cardiology

## 2019-01-14 MED ORDER — SIMVASTATIN 40 MG PO TABS: ORAL_TABLET | ORAL | 0 refills | Status: DC

## 2019-01-23 ENCOUNTER — Other Ambulatory Visit: Payer: Self-pay

## 2019-01-23 ENCOUNTER — Ambulatory Visit: Payer: Medicare Other | Admitting: *Deleted

## 2019-01-23 DIAGNOSIS — Z5181 Encounter for therapeutic drug level monitoring: Secondary | ICD-10-CM

## 2019-01-23 DIAGNOSIS — I480 Paroxysmal atrial fibrillation: Secondary | ICD-10-CM

## 2019-01-23 LAB — POCT INR: INR: 2.1 (ref 2.0–3.0)

## 2019-01-23 NOTE — Patient Instructions (Signed)
Description   Continue same dosage of 1/2 tablet every day except 1 tablet on Mondays, Wednesdays, and Fridays. Recheck in 6 weeks. Call our office if you have any questions or if placed on any new medications [phone].

## 2019-01-27 ENCOUNTER — Other Ambulatory Visit: Payer: Self-pay

## 2019-01-27 MED ORDER — LISINOPRIL 20 MG PO TABS: ORAL_TABLET | ORAL | 0 refills | Status: DC

## 2019-01-28 ENCOUNTER — Telehealth: Payer: Self-pay

## 2019-01-28 DIAGNOSIS — E7800 Pure hypercholesterolemia, unspecified: Secondary | ICD-10-CM

## 2019-01-28 DIAGNOSIS — Z5181 Encounter for therapeutic drug level monitoring: Secondary | ICD-10-CM

## 2019-01-28 NOTE — Telephone Encounter (Signed)
Spoke with patient regarding refill for simvastatin. 90 day supply was sent in, however Dr. [name] would like her to have a visit before she refills again as well as labs drawn. Appointment and labs have been scheduled.

## 2019-01-28 NOTE — Telephone Encounter (Signed)
YOUR CARDIOLOGY TEAM HAS ARRANGED FOR AN E-VISIT FOR YOUR APPOINTMENT - PLEASE REVIEW IMPORTANT INFORMATION BELOW SEVERAL DAYS PRIOR TO YOUR APPOINTMENT  Due to the recent COVID-19 pandemic, we are transitioning in-person office visits to tele-medicine visits in an effort to decrease unnecessary exposure to our patients, their families, and staff. These visits are billed to your insurance just like a normal visit is. We also encourage you to sign up for MyChart if you have not already done so. You will need a smartphone if possible. For patients that do not have this, we can still complete the visit using a regular telephone but do prefer a smartphone to enable video when possible. You may have a family member that lives with you that can help. If possible, we also ask that you have a blood pressure cuff and scale at home to measure your blood pressure, heart rate and weight prior to your scheduled appointment. Patients with clinical needs that need an in-person evaluation and testing will still be able to come to the office if absolutely necessary. If you have any questions, feel free to call our office.     THE DAY OF YOUR APPOINTMENT  Approximately 15 minutes prior to your scheduled appointment, you will receive a telephone call from one of [facility] team - your caller ID may say "Unknown caller."  Our staff will confirm medications, vital signs for the day and any symptoms you may be experiencing. Please have this information available prior to the time of visit start. It may also be helpful for you to have a pad of paper and pen handy for any instructions given during your visit. They will also walk you through joining the smartphone meeting if this is a video visit.    CONSENT FOR TELE-HEALTH VISIT - PLEASE REVIEW  I hereby voluntarily request, consent and authorize [facility] and its employed or contracted physicians, physician assistants, nurse practitioners or other licensed health care  professionals (the Practitioner), to provide me with telemedicine health care services (the "Services") as deemed necessary by the treating Practitioner. I acknowledge and consent to receive the Services by the Practitioner via telemedicine. I understand that the telemedicine visit will involve communicating with the Practitioner through live audiovisual communication technology and the disclosure of certain medical information by electronic transmission. I acknowledge that I have been given the opportunity to request an in-person assessment or other available alternative prior to the telemedicine visit and am voluntarily participating in the telemedicine visit.  I understand that I have the right to withhold or withdraw my consent to the use of telemedicine in the course of my care at any time, without affecting my right to future care or treatment, and that the Practitioner or I may terminate the telemedicine visit at any time. I understand that I have the right to inspect all information obtained and/or recorded in the course of the telemedicine visit and may receive copies of available information for a reasonable fee.  I understand that some of the potential risks of receiving the Services via telemedicine include:  Marland Kitchen Delay or interruption in medical evaluation due to technological equipment failure or disruption; . Information transmitted may not be sufficient (e.g. poor resolution of images) to allow for appropriate medical decision making by the Practitioner; and/or  . In rare instances, security protocols could fail, causing a breach of personal health information.  Furthermore, I acknowledge that it is my responsibility to provide information about my medical history, conditions and care that is  complete and accurate to the best of my ability. I acknowledge that Practitioner's advice, recommendations, and/or decision may be based on factors not within their control, such as incomplete or inaccurate  data provided by me or distortions of diagnostic images or specimens that may result from electronic transmissions. I understand that the practice of medicine is not an exact science and that Practitioner makes no warranties or guarantees regarding treatment outcomes. I acknowledge that I will receive a copy of this consent concurrently upon execution via email to the email address I last provided but may also request a printed copy by calling the office of [place].    I understand that my insurance will be billed for this visit.   I have read or had this consent read to me. . I understand the contents of this consent, which adequately explains the benefits and risks of the Services being provided via telemedicine.  . I have been provided ample opportunity to ask questions regarding this consent and the Services and have had my questions answered to my satisfaction. . I give my informed consent for the services to be provided through the use of telemedicine in my medical care  By participating in this telemedicine visit I agree to the above.

## 2019-01-29 ENCOUNTER — Other Ambulatory Visit: Payer: Self-pay | Admitting: Cardiology

## 2019-01-29 ENCOUNTER — Other Ambulatory Visit: Payer: Self-pay

## 2019-01-29 MED ORDER — SIMVASTATIN 40 MG PO TABS: ORAL_TABLET | ORAL | 0 refills | Status: DC

## 2019-01-29 MED ORDER — LISINOPRIL 20 MG PO TABS: ORAL_TABLET | ORAL | 0 refills | Status: DC

## 2019-01-29 NOTE — Telephone Encounter (Signed)
*  STAT* If patient is at the pharmacy, call can be transferred to refill team.   1. Which medications need to be refilled? (please list name of each medication and dose if known)   simvastatin (ZOCOR) 40 MG tablet  2. Which pharmacy/location (including street and city if local pharmacy) is medication to be sent to? [id number] - [place], [place] - [address]  3. Do they need a 30 day or 90 day supply? 90   Patient states that she has scheduled a virtual appt with Dr. [name] on 04/01/19 and lab work for 03/06/19.

## 2019-02-24 ENCOUNTER — Other Ambulatory Visit: Payer: Self-pay | Admitting: Cardiology

## 2019-03-06 ENCOUNTER — Other Ambulatory Visit: Payer: Medicare Other | Admitting: *Deleted

## 2019-03-06 ENCOUNTER — Other Ambulatory Visit: Payer: Self-pay

## 2019-03-06 ENCOUNTER — Ambulatory Visit: Payer: Medicare Other | Admitting: *Deleted

## 2019-03-06 ENCOUNTER — Encounter (INDEPENDENT_AMBULATORY_CARE_PROVIDER_SITE_OTHER): Payer: Self-pay

## 2019-03-06 DIAGNOSIS — I480 Paroxysmal atrial fibrillation: Secondary | ICD-10-CM

## 2019-03-06 DIAGNOSIS — Z5181 Encounter for therapeutic drug level monitoring: Secondary | ICD-10-CM

## 2019-03-06 DIAGNOSIS — E7800 Pure hypercholesterolemia, unspecified: Secondary | ICD-10-CM

## 2019-03-06 LAB — POCT INR: INR: 2.3 (ref 2.0–3.0)

## 2019-03-06 LAB — LIPID PANEL
Chol/HDL Ratio: 4.1 ratio (ref 0.0–4.4)
Cholesterol, Total: 142 mg/dL (ref 100–199)
HDL: 35 mg/dL — ABNORMAL LOW (ref >39)
LDL Chol Calc (NIH): 56 mg/dL (ref 0–99)
Triglycerides: 333 mg/dL — ABNORMAL HIGH (ref 0–149)
VLDL Cholesterol Cal: 51 mg/dL — ABNORMAL HIGH (ref 5–40)

## 2019-03-06 LAB — ALT: ALT: 11 IU/L (ref 0–32)

## 2019-03-06 NOTE — Patient Instructions (Addendum)
Description   Continue same dosage of 1/2 tablet every day except 1 tablet on Mondays, Wednesdays, and Fridays. Recheck in 7 weeks. Call our office if you have any questions or if placed on any new medications [phone].

## 2019-03-31 NOTE — Progress Notes (Signed)
Virtual Visit via Telephone Note   This visit type was conducted due to national recommendations for restrictions regarding the COVID-19 Pandemic (e.g. social distancing) in an effort to limit this patient's exposure and mitigate transmission in our community.  Due to her co-morbid illnesses, this patient is at least at moderate risk for complications without adequate follow up.  This format is felt to be most appropriate for this patient at this time.  All issues noted in this document were discussed and addressed.  A limited physical exam was performed with this format.  Please refer to the patient's chart for her consent to telehealth for [facility].   Evaluation Performed:  Follow-up visit  This visit type was conducted due to national recommendations for restrictions regarding the COVID-19 Pandemic (e.g. social distancing).  This format is felt to be most appropriate for this patient at this time.  All issues noted in this document were discussed and addressed.  No physical exam was performed (except for noted visual exam findings with Video Visits).  Please refer to the patient's chart (MyChart message for video visits and phone note for telephone visits) for the patient's consent to telehealth for [facility].  Date:  04/01/2019   ID:  Latasha Hodges, DOB 1945-10-18, MRN [id number]  Patient Location:  Home  Provider location:   [place]  PCP:  Kelton Pillar, MD  Cardiologist:  Fransico Him, MD  Electrophysiologist:  None   Chief Complaint:  PAF, HTN, DM, RBBB  History of Present Illness:    Latasha Hodges is a 73 y.o. female who presents via audio/video conferencing for a telehealth visit today.    Latasha Hodges is a 74 y.o. female with a hx of persistent atrial fibrillation on chronic anticoagulation, chronic RBBB, DMand HTN. She is here today for followup and is doing well.  She denies any chest pain or pressure, SOB, DOE, PND, orthopnea, LE edema,  dizziness, palpitations or syncope. She is compliant with her meds and is tolerating meds with no SE.    The patient does not have symptoms concerning for COVID-19 infection (fever, chills, cough, or new shortness of breath).   Prior CV studies:   The following studies were reviewed today:  Outside labs from [facility]  Past Medical History:  Diagnosis Date  . Bronchitis, chronic, simple ([place])   . Diabetes mellitus   . Hyperlipidemia   . Hypertension   . Obesity   . Persistent atrial fibrillation ([place])   . RBBB (right bundle branch block) 11/05/2017   Past Surgical History:  Procedure Laterality Date  . COLONOSCOPY  12/29/2010   Procedure: COLONOSCOPY;  Surgeon: Owens Loffler, MD;  Location: [facility];  Service: Endoscopy;  Laterality: N/A;  . TUBAL LIGATION  1980     Current Meds  Medication Sig  . fenofibrate micronized (LOFIBRA) 200 MG capsule TAKE 1 CAPSULE BY MOUTH DAILY BEFORE BREAKFAST  . flecainide (TAMBOCOR) 50 MG tablet TAKE 1 TABLET(50 MG) BY MOUTH TWICE DAILY  . lisinopril (ZESTRIL) 20 MG tablet TAKE 1 TABLET(20 MG) BY MOUTH DAILY  . Magnesium Oxide 500 MG TABS Take 1 tablet (500 mg total) by mouth 3 (three) times daily. (Patient taking differently: Take 400 mg by mouth 2 (two) times daily. )  . metFORMIN (GLUCOPHAGE) 1000 MG tablet Take 1,000 mg by mouth 2 (two) times daily.  . metoprolol succinate (TOPROL-XL) 50 MG 24 hr tablet Take 1 tablet (50 mg total) by mouth daily. with food. Please keep upcoming appt in February with  Dr. [name] before anymore refills. Thank you  . simvastatin (ZOCOR) 40 MG tablet TAKE 1 TABLET(40 MG) BY MOUTH DAILY AT 6 PM. PLEASE CALL PT AND LET HER KNOW YOU  HAVE THIS REFILL.Marland Kitchen PLEASE MAKE HER AWARE SHE MUST KEEP HER APPOINTMENT IN February FOR FURTHER REFILLS.  Marland Kitchen TRULICITY [id number] SOPN Inject 1.5 mg as directed daily.   Marland Kitchen warfarin (COUMADIN) 5 MG tablet TAKE BY MOUTH AS DIRECTED EVERY DAY     Allergies:   Iodinated diagnostic agents,  Penicillins, and Sulfa drugs cross reactors   Social History   Tobacco Use  . Smoking status: Former Smoker    Quit date: 12/28/1981    Years since quitting: 37.2  . Smokeless tobacco: Never Used  Substance Use Topics  . Alcohol use: No  . Drug use: No     Family Hx: The patient's family history includes Cancer in her mother; Colonic polyp in her cousin; Diabetes in her father; Hypertension in her father. There is no history of Colon cancer.  ROS:   Please see the history of present illness.     All other systems reviewed and are negative.   Labs/Other Tests and Data Reviewed:    Recent Labs: 03/06/2019: ALT 11   Recent Lipid Panel Lab Results  Component Value Date/Time   CHOL 142 03/06/2019 08:07 AM   TRIG 333 (H) 03/06/2019 08:07 AM   HDL 35 (L) 03/06/2019 08:07 AM   CHOLHDL 4.1 03/06/2019 08:07 AM   CHOLHDL 5 08/12/2014 08:05 AM   LDLCALC 56 03/06/2019 08:07 AM   LDLDIRECT 69.0 08/12/2014 08:05 AM    Wt Readings from Last 3 Encounters:  04/01/19 215 lb (97.5 kg)  11/05/17 222 lb 6.4 oz (100.9 kg)  08/20/17 219 lb 6.4 oz (99.5 kg)     Objective:    Vital Signs:  Ht 5\' 5"  (1.651 m)   Wt 215 lb (97.5 kg)   BMI 35.78 kg/m     ASSESSMENT & PLAN:    1.  Persistent atrial fibrillation -she denies any palpitations -her last event monitor showed NSR with no PAF -denies any bleeding problems on warfarin -HBG reviewed on outside labs by PCP was stable at 12 in Hodges 2020 on KPN -continue Toprol XL 50mg  daily, flecainide 50mg  BID and warfarin  2.  HTN -continue lisinopril 20mg  daily and Toprol XL 50mg  daily -outside labs by PCP on KPN reviewed and Creatinine was 0.720 and K+ 4.3 in Hodges 2020  3.  Chronic RBBB  -no ischemia on nuclear stress test 2019 -denies any chest pain  4.  DM 2 -followed by PCP -HbA1C reviewed on outside labs on KPN and was 8.3% in Oct 2020 -continue Metformin 1000mg  BID -continue statin and ACE I  5.  HLD -LDL goal < 70 in  setting of DM -last LDL was reviewed on outside labs on KPN and was at goal at 66 in Hodges 2020 and was 56 on labs 02/2019 but TAGs were elevated at 333 likely related to poorly controlled DM -continue simvastatin 40mg  daily and fenofibrate 200mg  daily -needs better control of DM  6.  Secondary hypercoagulable state/chronic monitoring of anticoagulation -her INR is followed in our clinic and was reviewed and was 2.3 last month -she denies any bleeding problems -she will continue on warfarin and monthly monitoring  COVID-19 Education: The signs and symptoms of COVID-19 were discussed with the patient and how to seek care for testing (follow up with PCP or arrange E-visit).  The  importance of social distancing was discussed today.  Patient Risk:   After full review of this patient's clinical status, I feel that they are at least moderate risk at this time.  Time:   Today, I have spent 20 minutes on telemeidicine discussing medical problems including PAF, HTN, RBBB, DM, HLD and reviewing patient's chart including outside labs on KPN.  Medication Adjustments/Labs and Tests Ordered: Current medicines are reviewed at length with the patient today.  Concerns regarding medicines are outlined above.  Tests Ordered: No orders of the defined types were placed in this encounter.  Medication Changes: No orders of the defined types were placed in this encounter.   Disposition:  Follow up in 1 year(s)  Signed, Fransico Him, MD  04/01/2019 1:04 PM    [facility]

## 2019-04-01 ENCOUNTER — Encounter: Payer: Self-pay | Admitting: Cardiology

## 2019-04-01 ENCOUNTER — Telehealth (INDEPENDENT_AMBULATORY_CARE_PROVIDER_SITE_OTHER): Payer: Medicare Other | Admitting: Cardiology

## 2019-04-01 ENCOUNTER — Other Ambulatory Visit: Payer: Self-pay

## 2019-04-01 VITALS — Ht 65.0 in | Wt 215.0 lb

## 2019-04-01 DIAGNOSIS — E7800 Pure hypercholesterolemia, unspecified: Secondary | ICD-10-CM

## 2019-04-01 DIAGNOSIS — I4819 Other persistent atrial fibrillation: Secondary | ICD-10-CM | POA: Diagnosis not present

## 2019-04-01 DIAGNOSIS — I4510 Unspecified right bundle-branch block: Secondary | ICD-10-CM

## 2019-04-01 DIAGNOSIS — D6869 Other thrombophilia: Secondary | ICD-10-CM

## 2019-04-01 DIAGNOSIS — I10 Essential (primary) hypertension: Secondary | ICD-10-CM

## 2019-04-01 DIAGNOSIS — E119 Type 2 diabetes mellitus without complications: Secondary | ICD-10-CM

## 2019-04-01 NOTE — Patient Instructions (Signed)
Medication Instructions:  Your physician recommends that you continue on your current medications as directed. Please refer to the Current Medication list given to you today.  *If you need a refill on your cardiac medications before your next appointment, please call your pharmacy*  Follow-Up: At [facility], you and your health needs are our priority.  As part of our continuing mission to provide you with exceptional heart care, we have created designated Provider Care Teams.  These Care Teams include your primary Cardiologist (physician) and Advanced Practice Providers (APPs -  Physician Assistants and Nurse Practitioners) who all work together to provide you with the care you need, when you need it.  Your next appointment:   6 month(s)  The format for your next appointment:   Either In Person or Virtual  Provider:   Fransico Him, MD

## 2019-04-17 ENCOUNTER — Other Ambulatory Visit: Payer: Self-pay

## 2019-04-17 MED ORDER — WARFARIN SODIUM 5 MG PO TABS: ORAL_TABLET | ORAL | 1 refills | Status: DC

## 2019-04-22 ENCOUNTER — Ambulatory Visit: Payer: Medicare Other | Admitting: Cardiology

## 2019-04-24 ENCOUNTER — Other Ambulatory Visit: Payer: Self-pay

## 2019-04-24 ENCOUNTER — Ambulatory Visit: Payer: Medicare Other | Admitting: *Deleted

## 2019-04-24 DIAGNOSIS — I480 Paroxysmal atrial fibrillation: Secondary | ICD-10-CM | POA: Diagnosis not present

## 2019-04-24 DIAGNOSIS — Z5181 Encounter for therapeutic drug level monitoring: Secondary | ICD-10-CM | POA: Diagnosis not present

## 2019-04-24 LAB — POCT INR: INR: 1.9 — AB (ref 2.0–3.0)

## 2019-04-24 NOTE — Patient Instructions (Signed)
Description   Take 1 tablet today, then continue the same dosage of 1/2 tablet every day except 1 tablet on Mondays, Wednesdays, and Fridays. Recheck in 5 weeks. Call our office if you have any questions or if placed on any new medications [phone].

## 2019-04-25 ENCOUNTER — Other Ambulatory Visit: Payer: Self-pay

## 2019-04-25 MED ORDER — METOPROLOL SUCCINATE ER 50 MG PO TB24: 50.0000 mg | ORAL_TABLET | Freq: Every day | ORAL | 3 refills | Status: DC

## 2019-04-25 MED ORDER — LISINOPRIL 20 MG PO TABS: ORAL_TABLET | ORAL | 3 refills | Status: DC

## 2019-04-28 ENCOUNTER — Other Ambulatory Visit: Payer: Self-pay | Admitting: Cardiology

## 2019-04-28 DIAGNOSIS — E7800 Pure hypercholesterolemia, unspecified: Secondary | ICD-10-CM

## 2019-04-28 DIAGNOSIS — I4819 Other persistent atrial fibrillation: Secondary | ICD-10-CM

## 2019-04-28 DIAGNOSIS — I4510 Unspecified right bundle-branch block: Secondary | ICD-10-CM

## 2019-04-28 DIAGNOSIS — I10 Essential (primary) hypertension: Secondary | ICD-10-CM

## 2019-04-28 MED ORDER — FLECAINIDE ACETATE 50 MG PO TABS: ORAL_TABLET | ORAL | 3 refills | Status: DC

## 2019-05-29 ENCOUNTER — Ambulatory Visit: Payer: Medicare Other | Admitting: *Deleted

## 2019-05-29 ENCOUNTER — Other Ambulatory Visit: Payer: Self-pay

## 2019-05-29 DIAGNOSIS — Z5181 Encounter for therapeutic drug level monitoring: Secondary | ICD-10-CM

## 2019-05-29 DIAGNOSIS — I4819 Other persistent atrial fibrillation: Secondary | ICD-10-CM

## 2019-05-29 DIAGNOSIS — I480 Paroxysmal atrial fibrillation: Secondary | ICD-10-CM

## 2019-05-29 LAB — POCT INR: INR: 2.1 (ref 2.0–3.0)

## 2019-05-29 NOTE — Patient Instructions (Signed)
Description   Continue taking 1/2 tablet every day except 1 tablet on Mondays, Wednesdays, and Fridays. Recheck in 6 weeks. Call our office if you have any questions or if placed on any new medications [phone].     

## 2019-07-08 ENCOUNTER — Ambulatory Visit: Payer: Medicare Other | Admitting: *Deleted

## 2019-07-08 ENCOUNTER — Other Ambulatory Visit: Payer: Self-pay

## 2019-07-08 DIAGNOSIS — Z5181 Encounter for therapeutic drug level monitoring: Secondary | ICD-10-CM

## 2019-07-08 DIAGNOSIS — I480 Paroxysmal atrial fibrillation: Secondary | ICD-10-CM

## 2019-07-08 LAB — POCT INR: INR: 1.9 — AB (ref 2.0–3.0)

## 2019-07-08 NOTE — Patient Instructions (Signed)
Description   Take 1 tablet today and then continue taking 1/2 tablet every day except 1 tablet on Mondays, Wednesdays, and Fridays. Recheck in 5 weeks. Call our office if you have any questions or if placed on any new medications [phone].

## 2019-07-25 ENCOUNTER — Other Ambulatory Visit: Payer: Self-pay

## 2019-07-25 MED ORDER — FENOFIBRATE 200 MG PO CAPS: ORAL_CAPSULE | ORAL | 2 refills | Status: DC

## 2019-07-28 ENCOUNTER — Other Ambulatory Visit: Payer: Self-pay | Admitting: *Deleted

## 2019-07-28 MED ORDER — SIMVASTATIN 40 MG PO TABS: ORAL_TABLET | ORAL | 2 refills | Status: DC

## 2019-08-08 ENCOUNTER — Ambulatory Visit: Payer: Medicare Other | Admitting: *Deleted

## 2019-08-08 ENCOUNTER — Other Ambulatory Visit: Payer: Self-pay

## 2019-08-08 DIAGNOSIS — Z5181 Encounter for therapeutic drug level monitoring: Secondary | ICD-10-CM

## 2019-08-08 DIAGNOSIS — I4819 Other persistent atrial fibrillation: Secondary | ICD-10-CM | POA: Diagnosis not present

## 2019-08-08 DIAGNOSIS — I480 Paroxysmal atrial fibrillation: Secondary | ICD-10-CM | POA: Diagnosis not present

## 2019-08-08 LAB — POCT INR: INR: 2.1 (ref 2.0–3.0)

## 2019-08-08 NOTE — Patient Instructions (Signed)
Description   Continue taking 1/2 tablet every day except 1 tablet on Mondays, Wednesdays, and Fridays. Recheck in 6 weeks. Call our office if you have any questions or if placed on any new medications [phone].

## 2019-09-12 ENCOUNTER — Other Ambulatory Visit: Payer: Self-pay

## 2019-09-12 ENCOUNTER — Ambulatory Visit: Payer: Medicare Other | Admitting: *Deleted

## 2019-09-12 DIAGNOSIS — I480 Paroxysmal atrial fibrillation: Secondary | ICD-10-CM

## 2019-09-12 DIAGNOSIS — Z5181 Encounter for therapeutic drug level monitoring: Secondary | ICD-10-CM | POA: Diagnosis not present

## 2019-09-12 LAB — POCT INR: INR: 2.5 (ref 2.0–3.0)

## 2019-09-12 NOTE — Patient Instructions (Signed)
Description   Continue taking 1/2 tablet every day except 1 tablet on Mondays, Wednesdays, and Fridays. Recheck in 6 weeks. Call our office if you have any questions or if placed on any new medications [phone].

## 2019-10-30 ENCOUNTER — Ambulatory Visit: Payer: Medicare Other | Admitting: *Deleted

## 2019-10-30 ENCOUNTER — Other Ambulatory Visit: Payer: Self-pay

## 2019-10-30 DIAGNOSIS — I480 Paroxysmal atrial fibrillation: Secondary | ICD-10-CM | POA: Diagnosis not present

## 2019-10-30 DIAGNOSIS — Z5181 Encounter for therapeutic drug level monitoring: Secondary | ICD-10-CM

## 2019-10-30 DIAGNOSIS — I4819 Other persistent atrial fibrillation: Secondary | ICD-10-CM

## 2019-10-30 LAB — POCT INR: INR: 3.5 — AB (ref 2.0–3.0)

## 2019-10-30 NOTE — Patient Instructions (Signed)
Description   Do not take any Warfarin today then continue taking 1/2 tablet every day except 1 tablet on Mondays, Wednesdays, and Fridays. Recheck in 4 weeks (normally 6 weeks). Call our office if you have any questions or if placed on any new medications [phone].

## 2019-11-27 ENCOUNTER — Other Ambulatory Visit: Payer: Self-pay | Admitting: Neurological Surgery

## 2019-11-27 ENCOUNTER — Other Ambulatory Visit: Payer: Self-pay

## 2019-11-27 ENCOUNTER — Ambulatory Visit: Payer: Medicare Other | Admitting: *Deleted

## 2019-11-27 DIAGNOSIS — Z5181 Encounter for therapeutic drug level monitoring: Secondary | ICD-10-CM

## 2019-11-27 DIAGNOSIS — I480 Paroxysmal atrial fibrillation: Secondary | ICD-10-CM | POA: Diagnosis not present

## 2019-11-27 DIAGNOSIS — I4819 Other persistent atrial fibrillation: Secondary | ICD-10-CM

## 2019-11-27 DIAGNOSIS — M4316 Spondylolisthesis, lumbar region: Secondary | ICD-10-CM

## 2019-11-27 LAB — POCT INR: INR: 3.7 — AB (ref 2.0–3.0)

## 2019-11-27 NOTE — Patient Instructions (Signed)
Description   Do not take any Warfarin today then start taking 1/2 tablet every day except 1 tablet on Mondays and Fridays. Recheck in 4 weeks (normally 6 weeks). Call our office if you have any questions or if placed on any new medications [phone].

## 2019-12-16 ENCOUNTER — Other Ambulatory Visit: Payer: Self-pay

## 2019-12-16 ENCOUNTER — Ambulatory Visit: Payer: Medicare Other

## 2019-12-16 DIAGNOSIS — Z5181 Encounter for therapeutic drug level monitoring: Secondary | ICD-10-CM | POA: Diagnosis not present

## 2019-12-16 DIAGNOSIS — I4819 Other persistent atrial fibrillation: Secondary | ICD-10-CM | POA: Diagnosis not present

## 2019-12-16 DIAGNOSIS — I480 Paroxysmal atrial fibrillation: Secondary | ICD-10-CM

## 2019-12-16 LAB — POCT INR: INR: 2.7 (ref 2.0–3.0)

## 2019-12-16 NOTE — Patient Instructions (Signed)
Description   Continue on same dosage 1/2 tablet every day except 1 tablet on Mondays and Fridays. Recheck in 4 weeks . Call our office if you have any questions or if placed on any new medications [phone].

## 2019-12-20 ENCOUNTER — Ambulatory Visit
Admission: RE | Admit: 2019-12-20 | Discharge: 2019-12-20 | Disposition: A | Discharge status: Home / Self Care | Payer: Medicare Other | Source: Ambulatory Visit | Attending: Neurological Surgery | Admitting: Neurological Surgery

## 2019-12-20 ENCOUNTER — Other Ambulatory Visit: Payer: Self-pay

## 2019-12-20 DIAGNOSIS — M4316 Spondylolisthesis, lumbar region: Secondary | ICD-10-CM

## 2019-12-23 ENCOUNTER — Other Ambulatory Visit: Payer: Self-pay | Admitting: Student

## 2019-12-23 DIAGNOSIS — N2889 Other specified disorders of kidney and ureter: Secondary | ICD-10-CM | POA: Insufficient documentation

## 2019-12-26 ENCOUNTER — Other Ambulatory Visit: Payer: Self-pay | Admitting: Family Medicine

## 2019-12-26 ENCOUNTER — Other Ambulatory Visit: Payer: Self-pay | Admitting: Student

## 2019-12-26 DIAGNOSIS — N2889 Other specified disorders of kidney and ureter: Secondary | ICD-10-CM

## 2019-12-28 ENCOUNTER — Ambulatory Visit
Admission: RE | Admit: 2019-12-28 | Discharge: 2019-12-28 | Disposition: A | Discharge status: Home / Self Care | Payer: Medicare Other | Source: Ambulatory Visit | Attending: Family Medicine | Admitting: Family Medicine

## 2019-12-28 ENCOUNTER — Other Ambulatory Visit: Payer: Self-pay | Admitting: Family Medicine

## 2019-12-28 DIAGNOSIS — N2889 Other specified disorders of kidney and ureter: Secondary | ICD-10-CM

## 2019-12-28 MED ORDER — GADOBENATE DIMEGLUMINE 529 MG/ML IV SOLN
19.0000 mL | Freq: Once | INTRAVENOUS | Status: AC | PRN
  Administered 2019-12-28: 19 mL via INTRAVENOUS

## 2020-01-06 ENCOUNTER — Telehealth: Payer: Self-pay | Admitting: Cardiology

## 2020-01-06 NOTE — Telephone Encounter (Signed)
   Primary Cardiologist: Fransico Him, MD  Chart reviewed as part of pre-operative protocol coverage. Patient's last visit was a virtual visit with Dr. [name] in 03/2019 (over 6 months ago). She has not been seen in person or had an EKG since 2019. Since procedure will be done under general anesthesia, I think patient needs a follow-up visit in order to better assess preoperative cardiovascular risk.  Pre-op covering staff: - Please schedule appointment and call patient to inform them. If patient already had an upcoming appointment within acceptable timeframe, please add "pre-op clearance" to the appointment notes so provider is aware. - Please contact requesting surgeon's office via preferred method (i.e, phone, fax) to inform them of need for appointment prior to surgery.  I will also route note to pharmacy pool for input on holding Coumadin as requested below so that this information is available to the clearing provider at time of patient's appointment.   I will remove from pre-op pool.  Darreld Mclean, PA-C  01/06/2020, 10:47 AM

## 2020-01-06 NOTE — Telephone Encounter (Signed)
   [facility] Pre-operative Risk Assessment    HEARTCARE STAFF: - Please ensure there is not already an duplicate clearance open for this procedure. - Under Visit Info/Reason for Call, type in Other and utilize the format Clearance MM/DD/YY or Clearance TBD. Do not use dashes or single digits. - If request is for dental extraction, please clarify the # of teeth to be extracted.  Request for surgical clearance:  1. What type of surgery is being performed? Right robot assisted laparoscopic radical nephrectomy with possible lipside nectomy   2. When is this surgery scheduled? 01/29/20  3. What type of clearance is required (medical clearance vs. Pharmacy clearance to hold med vs. Both)? both  4. Are there any medications that need to be held prior to surgery and how long? Warfarin, 5 days prior  5. Practice name and name of physician performing surgery? Dr. Raynelle Bring at [facility]  6. What is the office phone number? [phone] ext: [phone]   0.   What is the office fax number? [phone]  8.   Anesthesia type (None, local, MAC, general) ? General   Leah Newnam 01/06/2020, 10:06 AM  _________________________________________________________________   (provider comments below)

## 2020-01-07 ENCOUNTER — Other Ambulatory Visit: Payer: Self-pay | Admitting: Urology

## 2020-01-07 NOTE — Telephone Encounter (Signed)
Pre-op covering staff, can you please help schedule office visit for pre-op evaluation? Please see note below for additional information.  Thank you! Erez Mccallum

## 2020-01-07 NOTE — Telephone Encounter (Signed)
Pt need appointment with Dr [name] or APP for pre op clearance

## 2020-01-07 NOTE — Telephone Encounter (Signed)
Patient with diagnosis of afib on warfarin for anticoagulation.    Procedure: Right robot assisted laparoscopic radical nephrectomy with possible lipside nectomy  Date of procedure: 01/29/20    CHA2DS2-VASc Score = 4  This indicates a 4.8% annual risk of stroke. The patient's score is based upon: CHF History: 0 HTN History: 1 Diabetes History: 1 Stroke History: 0 Vascular Disease History: 0 Age Score: 1 Gender Score: 1      Per office protocol, patient can hold warfarin for 5 days prior to procedure.    Patient will NOT need bridging with Lovenox (enoxaparin) around procedure.   Marland Kitchen

## 2020-01-12 ENCOUNTER — Telehealth: Payer: Self-pay | Admitting: Cardiology

## 2020-01-12 ENCOUNTER — Ambulatory Visit: Payer: Medicare Other | Admitting: *Deleted

## 2020-01-12 ENCOUNTER — Other Ambulatory Visit: Payer: Self-pay

## 2020-01-12 DIAGNOSIS — I480 Paroxysmal atrial fibrillation: Secondary | ICD-10-CM

## 2020-01-12 DIAGNOSIS — Z5181 Encounter for therapeutic drug level monitoring: Secondary | ICD-10-CM

## 2020-01-12 LAB — POCT INR: INR: 2.5 (ref 2.0–3.0)

## 2020-01-12 NOTE — Telephone Encounter (Signed)
New message   Pt was seen today in coumadin clinic and stopped by my desk inquiring about a surgical clearance from Dr Alinda Money at [facility].  Patient request a callback from Dr [name].  She is aware that the preop team will be handling the preop clearance info but wanted to talk to Dr [name] anyway.

## 2020-01-12 NOTE — Patient Instructions (Signed)
Description   Continue on same dosage 1/2 tablet every day except 1 tablet on Mondays and Fridays. Hold warfarin 5 days prior to procedure, resume normal dose of warfarin the day of procedure or as instructed by your physcian. Recheck 2 weeks post procedure. Call our office if you have any questions or if placed on any new medications [phone].

## 2020-01-12 NOTE — Telephone Encounter (Signed)
Spoke with the patient and she is aware that she needs an appointment for pre-op clearance. I offered her an appointment with Dr. [name] on 12/13. However patient states that she cant do that because she has a COVID screening on 12/09 and has to quarantine from then until her surgery on 12/16. Advised patient to see if she could wait to have COVID screen until after she sees Dr. [name].

## 2020-01-14 DIAGNOSIS — C641 Malignant neoplasm of right kidney, except renal pelvis: Secondary | ICD-10-CM

## 2020-01-14 HISTORY — DX: Malignant neoplasm of right kidney, except renal pelvis: C64.1

## 2020-01-15 ENCOUNTER — Other Ambulatory Visit: Payer: Self-pay | Admitting: Cardiology

## 2020-01-15 ENCOUNTER — Other Ambulatory Visit: Payer: Medicare Other

## 2020-01-15 NOTE — Telephone Encounter (Addendum)
Warfarin refill was sent today; pt needs to call the pharmacy to see why denied. Called pt and she was made aware that med was sent this morning. She said the pharmacy called her back and said it was approved. She said she don't know what happened and made her aware I refilled it within 20 minutes of receiving so it was never denied over here at the office. She was thankful and appreciative for the follow up.

## 2020-01-15 NOTE — Telephone Encounter (Signed)
New Message:   Pt said she just received a text that her Warfarin was denied from the pharmacy.

## 2020-01-16 ENCOUNTER — Telehealth: Payer: Self-pay | Admitting: Cardiology

## 2020-01-16 NOTE — Telephone Encounter (Signed)
    Pt would like to speak with Dr. [name]. She really would like to get a call from her. She wanted to talk to her about her upcoming surgery she said its a life and death surgery and would like to speak with her.  She is upset that she did not get a callback after 11/29 about scheduling her an appt for her clearance

## 2020-01-16 NOTE — Telephone Encounter (Signed)
Spoke with the patient who states that she is very frustrated that she has not been cleared for surgery. I advised the patient of our process for cardiac clearance and it was determined by our pre-op team that she needed to be seen in the office since she has not been seen in the person or had an EKG since 2019.  The patient does not understand why she cannot be seen sooner than 12/9 since it is only one week prior to her surgery (12/16). I advised her that we also have a process for scheduling patients and we make sure that they are seen as soon as possible prior to their surgery for clearance. Also advised her that we do not have any openings prior to 12/9, however if something opens up I would let her know.  The patient voiced concern that if she needed any cardiac testing for clearance that there would not be time to have it done. She states that her surgery is life or death and she can't post-pone it. I advised her that if there was anything we needed to get done to clear her for surgery we would ensure to have it completed prior to her surgery.  Patient was still very frustrated with this process and is requesting a call from Dr. [name]. I asked if there was anything specific that I could ask Dr. [name] for her and she stated no that she wanted to talk with her directly.

## 2020-01-16 NOTE — Telephone Encounter (Signed)
Patient has an appt this week with PA

## 2020-01-21 NOTE — Patient Instructions (Addendum)
DUE TO COVID-19 ONLY ONE VISITOR IS ALLOWED TO COME WITH YOU AND STAY IN THE WAITING ROOM ONLY DURING PRE OP AND PROCEDURE DAY OF SURGERY. THE 1 VISITOR  MAY VISIT WITH YOU AFTER SURGERY IN YOUR PRIVATE ROOM DURING VISITING HOURS ONLY!  YOU NEED TO HAVE A COVID 19 TEST ON: 01/26/20 @ 9:00 AM , THIS TEST MUST BE DONE BEFORE SURGERY,  COVID TESTING SITE [place] [id number], IT IS ON THE RIGHT GOING OUT [address] APPROXIMATELY  2 MINUTES PAST [employer] ON THE RIGHT. ONCE YOUR COVID TEST IS COMPLETED,  PLEASE BEGIN THE QUARANTINE INSTRUCTIONS AS OUTLINED IN YOUR HANDOUT.                Latasha Hodges   Your procedure is scheduled on: 01/29/20   Report to [facility] Main  Entrance   Report to admitting at : 10:15 AM     Call this number if you have problems the morning of surgery [phone]    Remember: Do not eat solid food :After Midnight. Clear liquids until: 9:15 am.  CLEAR LIQUID DIET   Foods Allowed                                                                     Foods Excluded  Coffee and tea, regular and decaf                             liquids that you cannot  Plain Jell-O any favor except red or purple                                           see through such as: Fruit ices (not with fruit pulp)                                     milk, soups, orange juice  Iced Popsicles                                    All solid food Carbonated beverages, regular and diet                                    Cranberry, grape and apple juices Sports drinks like Gatorade Lightly seasoned clear broth or consume(fat free) Sugar, honey syrup  Sample Menu Breakfast                                Lunch                                     Supper Cranberry juice  Beef broth                            Chicken broth Jell-O                                     Grape juice                           Apple juice Coffee or tea                         Jell-O                                      Popsicle                                                Coffee or tea                        Coffee or tea  _____________________________________________________________________  BRUSH YOUR TEETH MORNING OF SURGERY AND RINSE YOUR MOUTH OUT, NO CHEWING GUM CANDY OR MINTS.   Take these medicines the morning of surgery with A SIP OF WATER: tambocor.  How to Manage Your Diabetes Before and After Surgery  Why is it important to control my blood sugar before and after surgery? . Improving blood sugar levels before and after surgery helps healing and can limit problems. . A way of improving blood sugar control is eating a healthy diet by: o  Eating less sugar and carbohydrates o  Increasing activity/exercise o  Talking with your doctor about reaching your blood sugar goals . High blood sugars (greater than 180 mg/dL) can raise your risk of infections and slow your recovery, so you will need to focus on controlling your diabetes during the weeks before surgery. . Make sure that the doctor who takes care of your diabetes knows about your planned surgery including the date and location.  How do I manage my blood sugar before surgery? . Check your blood sugar at least 4 times a day, starting 2 days before surgery, to make sure that the level is not too high or low. o Check your blood sugar the morning of your surgery when you wake up and every 2 hours until you get to the Short Stay unit. . If your blood sugar is less than 70 mg/dL, you will need to treat for low blood sugar: o Do not take insulin. o Treat a low blood sugar (less than 70 mg/dL) with  cup of clear juice (cranberry or apple), 4 glucose tablets, OR glucose gel. o Recheck blood sugar in 15 minutes after treatment (to make sure it is greater than 70 mg/dL). If your blood sugar is not greater than 70 mg/dL on recheck, call [phone] for further instructions. . Report  your blood sugar to the short stay nurse when you get to Short Stay.  . If you are admitted to the hospital after surgery: o Your blood sugar will be checked by the staff and you will probably be given insulin after  surgery (instead of oral diabetes medicines) to make sure you have good blood sugar levels. o The goal for blood sugar control after surgery is 80-180 mg/dL.   WHAT DO I DO ABOUT MY DIABETES MEDICATION?  Marland Kitchen Do not take oral diabetes medicines (pills) the morning of surgery.  . THE DAY BEFORE SURGERY, take Metformin as usual.   . THE MORNING OF SURGERY, DO NOT TAKE ANY DIABETIC MEDICATIONS DAY OF YOUR SURGERY  . The day of surgery, do not take other diabetes injectables, including Byetta (exenatide), Bydureon (exenatide ER), Victoza (liraglutide), or Trulicity (dulaglutide).                               You may not have any metal on your body including hair pins and              piercings  Do not wear jewelry, make-up, lotions, powders or perfumes, deodorant             Do not wear nail polish on your fingernails.  Do not shave  48 hours prior to surgery.       Do not bring valuables to the hospital. [place].  Contacts, dentures or bridgework may not be worn into surgery.  Leave suitcase in the car. After surgery it may be brought to your room.     Patients discharged the day of surgery will not be allowed to drive home. IF YOU ARE HAVING SURGERY AND GOING HOME THE SAME DAY, YOU MUST HAVE AN ADULT TO DRIVE YOU HOME AND BE WITH YOU FOR 24 HOURS. YOU MAY GO HOME BY TAXI OR UBER OR ORTHERWISE, BUT AN ADULT MUST ACCOMPANY YOU HOME AND STAY WITH YOU FOR 24 HOURS.  Name and phone number of your driver:  Special Instructions: N/A              Please read over the following fact sheets you were given: _____________________________________________________________________        [facility] - Preparing for Surgery Before surgery,  you can play an important role.  Because skin is not sterile, your skin needs to be as free of germs as possible.  You can reduce the number of germs on your skin by washing with CHG (chlorahexidine gluconate) soap before surgery.  CHG is an antiseptic cleaner which kills germs and bonds with the skin to continue killing germs even after washing. Please DO NOT use if you have an allergy to CHG or antibacterial soaps.  If your skin becomes reddened/irritated stop using the CHG and inform your nurse when you arrive at Short Stay. Do not shave (including legs and underarms) for at least 48 hours prior to the first CHG shower.  You may shave your face/neck. Please follow these instructions carefully:  1.  Shower with CHG Soap the night before surgery and the  morning of Surgery.  2.  If you choose to wash your hair, wash your hair first as usual with your  normal  shampoo.  3.  After you shampoo, rinse your hair and body thoroughly to remove the  shampoo.                           4.  Use CHG as you would any other liquid soap.  You  can apply chg directly  to the skin and wash                       Gently with a scrungie or clean washcloth.  5.  Apply the CHG Soap to your body ONLY FROM THE NECK DOWN.   Do not use on face/ open                           Wound or open sores. Avoid contact with eyes, ears mouth and genitals (private parts).                       Wash face,  Genitals (private parts) with your normal soap.             6.  Wash thoroughly, paying special attention to the area where your surgery  will be performed.  7.  Thoroughly rinse your body with warm water from the neck down.  8.  DO NOT shower/wash with your normal soap after using and rinsing off  the CHG Soap.                9.  Pat yourself dry with a clean towel.            10.  Wear clean pajamas.            11.  Place clean sheets on your bed the night of your first shower and do not  sleep with pets. Day of Surgery : Do not  apply any lotions/deodorants the morning of surgery.  Please wear clean clothes to the hospital/surgery center.  FAILURE TO FOLLOW THESE INSTRUCTIONS MAY RESULT IN THE CANCELLATION OF YOUR SURGERY PATIENT SIGNATURE_________________________________  NURSE SIGNATURE__________________________________  ________________________________________________________________________   Adam Phenix  An incentive spirometer is a tool that can help keep your lungs clear and active. This tool measures how well you are filling your lungs with each breath. Taking long deep breaths may help reverse or decrease the chance of developing breathing (pulmonary) problems (especially infection) following:  A long period of time when you are unable to move or be active. BEFORE THE PROCEDURE   If the spirometer includes an indicator to show your best effort, your nurse or respiratory therapist will set it to a desired goal.  If possible, sit up straight or lean slightly forward. Try not to slouch.  Hold the incentive spirometer in an upright position. INSTRUCTIONS FOR USE  1. Sit on the edge of your bed if possible, or sit up as far as you can in bed or on a chair. 2. Hold the incentive spirometer in an upright position. 3. Breathe out normally. 4. Place the mouthpiece in your mouth and seal your lips tightly around it. 5. Breathe in slowly and as deeply as possible, raising the piston or the ball toward the top of the column. 6. Hold your breath for 3-5 seconds or for as long as possible. Allow the piston or ball to fall to the bottom of the column. 7. Remove the mouthpiece from your mouth and breathe out normally. 8. Rest for a few seconds and repeat Steps 1 through 7 at least 10 times every 1-2 hours when you are awake. Take your time and take a few normal breaths between deep breaths. 9. The spirometer may include an indicator to show your best effort. Use the indicator as a goal to work toward during  each repetition. 10. After each set of 10 deep breaths, practice coughing to be sure your lungs are clear. If you have an incision (the cut made at the time of surgery), support your incision when coughing by placing a pillow or rolled up towels firmly against it. Once you are able to get out of bed, walk around indoors and cough well. You may stop using the incentive spirometer when instructed by your caregiver.  RISKS AND COMPLICATIONS  Take your time so you do not get dizzy or light-headed.  If you are in pain, you may need to take or ask for pain medication before doing incentive spirometry. It is harder to take a deep breath if you are having pain. AFTER USE  Rest and breathe slowly and easily.  It can be helpful to keep track of a log of your progress. Your caregiver can provide you with a simple table to help with this. If you are using the spirometer at home, follow these instructions: [place] IF:   You are having difficultly using the spirometer.  You have trouble using the spirometer as often as instructed.  Your pain medication is not giving enough relief while using the spirometer.  You develop fever of 100.5 F (38.1 C) or higher. SEEK IMMEDIATE MEDICAL CARE IF:   You cough up bloody sputum that had not been present before.  You develop fever of 102 F (38.9 C) or greater.  You develop worsening pain at or near the incision site. MAKE SURE YOU:   Understand these instructions.  Will watch your condition.  Will get help right away if you are not doing well or get worse. Document Released: 06/12/2006 Document Revised: 04/24/2011 Document Reviewed: 08/13/2006 [facility] Patient Information 2014 [place], [place].   ________________________________________________________________________

## 2020-01-22 ENCOUNTER — Encounter (HOSPITAL_COMMUNITY)
Admission: RE | Admit: 2020-01-22 | Discharge: 2020-01-22 | Disposition: A | Discharge status: Home / Self Care | Payer: Medicare Other | Source: Ambulatory Visit | Attending: Urology | Admitting: Urology

## 2020-01-22 ENCOUNTER — Ambulatory Visit: Payer: Medicare Other | Admitting: Physician Assistant

## 2020-01-22 ENCOUNTER — Encounter (HOSPITAL_COMMUNITY): Payer: Self-pay

## 2020-01-22 ENCOUNTER — Other Ambulatory Visit: Payer: Self-pay

## 2020-01-22 ENCOUNTER — Encounter: Payer: Self-pay | Admitting: Physician Assistant

## 2020-01-22 VITALS — BP 150/78 | HR 71 | Ht 65.0 in | Wt 214.0 lb

## 2020-01-22 DIAGNOSIS — Z01818 Encounter for other preprocedural examination: Secondary | ICD-10-CM | POA: Insufficient documentation

## 2020-01-22 DIAGNOSIS — I4510 Unspecified right bundle-branch block: Secondary | ICD-10-CM

## 2020-01-22 DIAGNOSIS — I4819 Other persistent atrial fibrillation: Secondary | ICD-10-CM | POA: Diagnosis not present

## 2020-01-22 DIAGNOSIS — I10 Essential (primary) hypertension: Secondary | ICD-10-CM | POA: Diagnosis not present

## 2020-01-22 DIAGNOSIS — E7800 Pure hypercholesterolemia, unspecified: Secondary | ICD-10-CM | POA: Diagnosis not present

## 2020-01-22 DIAGNOSIS — Z01810 Encounter for preprocedural cardiovascular examination: Secondary | ICD-10-CM

## 2020-01-22 HISTORY — DX: Unspecified osteoarthritis, unspecified site: M19.90

## 2020-01-22 HISTORY — DX: Chronic kidney disease, unspecified: N18.9

## 2020-01-22 LAB — CBC
HCT: 36.6 % (ref 36.0–46.0)
Hemoglobin: 11.6 g/dL — ABNORMAL LOW (ref 12.0–15.0)
MCH: 27.7 pg (ref 26.0–34.0)
MCHC: 31.7 g/dL (ref 30.0–36.0)
MCV: 87.4 fL (ref 80.0–100.0)
Platelets: 276 10*3/uL (ref 150–400)
RBC: 4.19 MIL/uL (ref 3.87–5.11)
RDW: 13.2 % (ref 11.5–15.5)
WBC: 5.4 10*3/uL (ref 4.0–10.5)
nRBC: 0 % (ref 0.0–0.2)

## 2020-01-22 LAB — BASIC METABOLIC PANEL
Anion gap: 9 (ref 5–15)
BUN: 17 mg/dL (ref 8–23)
CO2: 25 mmol/L (ref 22–32)
Calcium: 9.4 mg/dL (ref 8.9–10.3)
Chloride: 105 mmol/L (ref 98–111)
Creatinine, Ser: 0.73 mg/dL (ref 0.44–1.00)
GFR, Estimated: >60 mL/min (ref >60)
Glucose, Bld: 169 mg/dL — ABNORMAL HIGH (ref 70–99)
Potassium: 4.2 mmol/L (ref 3.5–5.1)
Sodium: 139 mmol/L (ref 135–145)

## 2020-01-22 LAB — HEMOGLOBIN A1C
Hgb A1c MFr Bld: 8.6 % — ABNORMAL HIGH (ref 4.8–5.6)
Mean Plasma Glucose: 200.12 mg/dL

## 2020-01-22 LAB — PROTIME-INR
INR: 1.8 — ABNORMAL HIGH (ref 0.8–1.2)
Prothrombin Time: 20.6 seconds — ABNORMAL HIGH (ref 11.4–15.2)

## 2020-01-22 NOTE — Progress Notes (Signed)
Cardiology Office Note:    Date:  01/22/2020   ID:  [name], DOB 26-Dec-1945, MRN [id number]  PCP:  Kelton Pillar, MD  [facility] Cardiologist:  Fransico Him, MD  [place] Electrophysiologist:  None   Chief Complaint: surgical clearance for Right robot assisted laparoscopic radical nephrectomy with possible lipside nectomy  History of Present Illness:    Latasha Hodges is a 74 y.o. female with a hx of persistent atrial fibrillation on chronic anticoagulation, chronic RBBB, DM, HLDand HTN seen for surgical clearance.   She was doing well on cardiac stand point when last seen by Dr. [name] 04/01/2019.   Here today for surgical clearance.  She is unable to do extraneous activity secondary to spinal stenosis.  However, she is doing household chores and going up flights of stairs (slow but able to go up).  She walks with walker during grocery shopping however does not need walker at home.  She denies exertional chest tightness or pressure.  Denies dizziness, palpitation, orthopnea, PND, syncope, lower extremity edema, shortness of breath or melena.  Compliant with her medications.  Past Medical History:  Diagnosis Date  . Bronchitis, chronic, simple ([place])   . Diabetes mellitus   . Hyperlipidemia   . Hypertension   . Obesity   . Persistent atrial fibrillation ([place])   . RBBB (right bundle branch block) 11/05/2017    Past Surgical History:  Procedure Laterality Date  . COLONOSCOPY  12/29/2010   Procedure: COLONOSCOPY;  Surgeon: Owens Loffler, MD;  Location: [facility];  Service: Endoscopy;  Laterality: N/A;  . TUBAL LIGATION  1980    Current Medications: Current Meds  Medication Sig  . Dulaglutide (TRULICITY) 1.5 KG/8.1EH SOPN Inject 1.5 mg into the skin every Sunday.  . fenofibrate micronized (LOFIBRA) 200 MG capsule TAKE 1 CAPSULE BY MOUTH DAILY BEFORE BREAKFAST (Patient taking differently: Take 200 mg by mouth daily before breakfast.)  . flecainide  (TAMBOCOR) 50 MG tablet TAKE 1 TABLET(50 MG) BY MOUTH TWICE DAILY (Patient taking differently: Take 50 mg by mouth 2 (two) times daily.)  . lisinopril (ZESTRIL) 20 MG tablet TAKE 1 TABLET(20 MG) BY MOUTH DAILY (Patient taking differently: Take 20 mg by mouth at bedtime.)  . magnesium oxide (MAG-OX) 400 MG tablet Take 800 mg by mouth at bedtime.  . metFORMIN (GLUCOPHAGE) 1000 MG tablet Take 1,000 mg by mouth 2 (two) times daily.  . metoprolol succinate (TOPROL-XL) 50 MG 24 hr tablet Take 1 tablet (50 mg total) by mouth daily. (Patient taking differently: Take 50 mg by mouth at bedtime.)  . simvastatin (ZOCOR) 40 MG tablet TAKE 1 TABLET(40 MG) BY MOUTH DAILY AT 6 PM. (Patient taking differently: Take 40 mg by mouth daily at 6 PM.)  . warfarin (COUMADIN) 5 MG tablet TAKE BY MOUTH AS DIRECTED EVERY DAY     Allergies:   Iodinated diagnostic agents, No healthtouch food allergies, Penicillins, Sulfa drugs cross reactors, and Theophyllines   Social History   Socioeconomic History  . Marital status: Divorced    Spouse name: Not on file  . Number of children: 2  . Years of education: Not on file  . Highest education level: Not on file  Occupational History  . Occupation: ADMIN ASSISTANT  Tobacco Use  . Smoking status: Former Smoker    Quit date: 12/28/1981    Years since quitting: 38.0  . Smokeless tobacco: Never Used  Vaping Use  . Vaping Use: Never used  Substance and Sexual Activity  . Alcohol use: No  .  Drug use: No  . Sexual activity: Never    Birth control/protection: Post-menopausal  Other Topics Concern  . Not on file  Social History Narrative   0 caffeine drinks daily    Social Determinants of Health   Financial Resource Strain: Not on file  Food Insecurity: Not on file  Transportation Needs: Not on file  Physical Activity: Not on file  Stress: Not on file  Social Connections: Not on file     Family History: The patient's family history includes Cancer in her mother;  Colonic polyp in her cousin; Diabetes in her father; Hypertension in her father. There is no history of Colon cancer.    ROS:   Please see the history of present illness.    All other systems reviewed and are negative.   EKGs/Labs/Other Studies Reviewed:    The following studies were reviewed today:  Stress test 08/2017  The left ventricular ejection fraction is normal (55-65%).  Nuclear stress EF: 61%.  There was no ST segment deviation noted during stress.  The study is normal.  This is a low risk study.   Normal resting and stress perfusion. No ischemia or infarction EF 61%  Monitor 12/2017  Sinus bradycardia, normal sinus rhythm and sinus tachycardia. The average heart rate was 73bpm with heart rate range form 50 to 118bpm.     EKG:  EKG is  ordered today.  The ekg ordered today demonstrates sinus rhythm and right bundle branch block  Recent Labs: 03/06/2019: ALT 11  Recent Lipid Panel    Component Value Date/Time   CHOL 142 03/06/2019 0807   TRIG 333 (H) 03/06/2019 0807   HDL 35 (L) 03/06/2019 0807   CHOLHDL 4.1 03/06/2019 0807   CHOLHDL 5 08/12/2014 0805   LDLCALC 56 03/06/2019 0807   LDLDIRECT 69.0 08/12/2014 0805     Risk Assessment/Calculations:    CHA2DS2-VASc Score = 4  This indicates a 4.8% annual risk of stroke. The patient's score is based upon: CHF History: No HTN History: Yes Diabetes History: Yes Stroke History: No Vascular Disease History: No Age Score: 1 Gender Score: 1  Physical Exam:    VS:  BP (!) 150/78   Pulse 71   Ht 5\' 5"  (1.651 m)   Wt 214 lb 0.6 oz (97.1 kg)   SpO2 97%   BMI 35.62 kg/m     Wt Readings from Last 3 Encounters:  01/22/20 214 lb 0.6 oz (97.1 kg)  04/01/19 215 lb (97.5 kg)  11/05/17 222 lb 6.4 oz (100.9 kg)     GEN:  Well nourished, well developed in no acute distress HEENT: Normal NECK: No JVD; No carotid bruits LYMPHATICS: No lymphadenopathy CARDIAC: RRR, no murmurs, rubs, gallops RESPIRATORY:   Clear to auscultation without rales, wheezing or rhonchi  ABDOMEN: Soft, non-tender, non-distended MUSCULOSKELETAL:  No edema; No deformity  SKIN: Warm and dry NEUROLOGIC:  Alert and oriented x 3 PSYCHIATRIC:  Normal affect   ASSESSMENT AND PLAN:    1. Persistent atrial fibrillation -In sinus rhythm today.  Rate controlled.  No bleeding issue. -Continue metoprolol succinate and warfarin.  2. HTN -Minimally elevated.  Patient is under a lot of stress.  Advised to keep log, -Continue Toprol-XL 50 mg daily and lisinopril 20 mg daily  3. HLD - 03/06/2019: Cholesterol, Total 142; HDL 35; LDL Chol Calc (NIH) 56; Triglycerides 333  -Continue statin  4. DM - Per PCP  5. Pre-op clearance -Despite spinal stenosis she is able to get 4 METS  of activity.  She can walk with walker.  She is going on stairs. Does household chores without angina.    Per pharmacist "Per office protocol, patient can hold warfarin for 5 days prior to procedure.  Patient will NOT need bridging with Lovenox (enoxaparin) around procedure".  Given past medical history and time since last visit, based on ACC/AHA guidelines, Latasha Hodges would be at acceptable risk for the planned procedure without further cardiovascular testing.  I will route this recommendation to the requesting party via Epic fax function and remove from pre-op pool.  Please call with questions.    Medication Adjustments/Labs and Tests Ordered: Current medicines are reviewed at length with the patient today.  Concerns regarding medicines are outlined above.  Orders Placed This Encounter  Procedures  . EKG 12-Lead   No orders of the defined types were placed in this encounter.   Patient Instructions  Medication Instructions:  Your physician recommends that you continue on your current medications as directed. Please refer to the Current Medication list given to you today.  *If you need a refill on your cardiac medications before your  next appointment, please call your pharmacy*   Lab Work: None ordered  If you have labs (blood work) drawn today and your tests are completely normal, you will receive your results only by: Marland Kitchen MyChart Message (if you have MyChart) OR . A paper copy in the mail If you have any lab test that is abnormal or we need to change your treatment, we will call you to review the results.   Testing/Procedures: None ordered   Follow-Up: At [facility], you and your health needs are our priority.  As part of our continuing mission to provide you with exceptional heart care, we have created designated Provider Care Teams.  These Care Teams include your primary Cardiologist (physician) and Advanced Practice Providers (APPs -  Physician Assistants and Nurse Practitioners) who all work together to provide you with the care you need, when you need it.  We recommend signing up for the patient portal called "MyChart".  Sign up information is provided on this After Visit Summary.  MyChart is used to connect with patients for Virtual Visits (Telemedicine).  Patients are able to view lab/test results, encounter notes, upcoming appointments, etc.  Non-urgent messages can be sent to your provider as well.   To learn more about what you can do with MyChart, go to [email or web address].    Your next appointment:   12 month(s)  The format for your next appointment:   In Person  Provider:   You may see Fransico Him, MD or one of the following Advanced Practice Providers on your designated Care Team:    Melina Copa, PA-C  Ermalinda Barrios, PA-C    Other Instructions      Signed, Leanor Kail, [place]  01/22/2020 8:32 AM    [place]

## 2020-01-22 NOTE — Progress Notes (Addendum)
COVID Vaccine Completed: Yes Date COVID Vaccine completed: 11/19/19 [place] COVID vaccine manufacturer: Pfizer     PCP - Dr. Kelton Pillar Cardiologist - Dr. Tressia Miners Turning. LOV: 01/22/20.Clearance: Bhavinkumar Bhagat.  Chest x-ray -  EKG - 01/22/20 @ cardiologist. EPIC Stress Test -  ECHO -  Cardiac Cath -  Pacemaker/ICD device last checked:  Sleep Study -  CPAP -   Fasting Blood Sugar - 140's Checks Blood Sugar __1___ times a week  Blood Thinner Instructions: Coumadin will be hold five days before surgery as per cardiologist instructions. Aspirin Instructions: Last Dose:  Anesthesia review: Hx: HTN,Rt. BBB,DM,Afib.Pt. able to go up a flight of stairs with some mobility difficulties due to her back pain.  Patient denies shortness of breath, fever, cough and chest pain at PAT appointment   Patient verbalized understanding of instructions that were given to them at the PAT appointment. Patient was also instructed that they will need to review over the PAT instructions again at home before surgery.

## 2020-01-22 NOTE — Patient Instructions (Signed)
Medication Instructions:  Your physician recommends that you continue on your current medications as directed. Please refer to the Current Medication list given to you today.  *If you need a refill on your cardiac medications before your next appointment, please call your pharmacy*   Lab Work: None ordered  If you have labs (blood work) drawn today and your tests are completely normal, you will receive your results only by: Marland Kitchen MyChart Message (if you have MyChart) OR . A paper copy in the mail If you have any lab test that is abnormal or we need to change your treatment, we will call you to review the results.   Testing/Procedures: None ordered   Follow-Up: At [facility], you and your health needs are our priority.  As part of our continuing mission to provide you with exceptional heart care, we have created designated Provider Care Teams.  These Care Teams include your primary Cardiologist (physician) and Advanced Practice Providers (APPs -  Physician Assistants and Nurse Practitioners) who all work together to provide you with the care you need, when you need it.  We recommend signing up for the patient portal called "MyChart".  Sign up information is provided on this After Visit Summary.  MyChart is used to connect with patients for Virtual Visits (Telemedicine).  Patients are able to view lab/test results, encounter notes, upcoming appointments, etc.  Non-urgent messages can be sent to your provider as well.   To learn more about what you can do with MyChart, go to [email or web address].    Your next appointment:   12 month(s)  The format for your next appointment:   In Person  Provider:   You may see Fransico Him, MD or one of the following Advanced Practice Providers on your designated Care Team:    Melina Copa, PA-C  Ermalinda Barrios, PA-C    Other Instructions

## 2020-01-22 NOTE — Progress Notes (Signed)
Lab results: A1-C: 8.6. PT: 20.6 INR: 1.8

## 2020-01-23 NOTE — Anesthesia Preprocedure Evaluation (Addendum)
Anesthesia Evaluation  Patient identified by MRN, date of birth, ID band Patient awake    Reviewed: Allergy & Precautions, NPO status , Patient's Chart, lab work & pertinent test results  Airway Mallampati: I  TM Distance: >3 FB Neck ROM: Full    Dental   Pulmonary former smoker,    Pulmonary exam normal        Cardiovascular hypertension, Pt. on medications Normal cardiovascular exam+ dysrhythmias Atrial Fibrillation      Neuro/Psych    GI/Hepatic   Endo/Other  diabetes, Type 2, Oral Hypoglycemic Agents  Renal/GU      Musculoskeletal   Abdominal   Peds  Hematology   Anesthesia Other Findings   Reproductive/Obstetrics                           Anesthesia Physical Anesthesia Plan  ASA: II  Anesthesia Plan: General   Post-op Pain Management:    Induction: Intravenous  PONV Risk Score and Plan: 3 and Treatment may vary due to age or medical condition, Midazolam and Ondansetron  Airway Management Planned: Oral ETT  Additional Equipment:   Intra-op Plan:   Post-operative Plan: Extubation in OR  Informed Consent: I have reviewed the patients History and Physical, chart, labs and discussed the procedure including the risks, benefits and alternatives for the proposed anesthesia with the patient or authorized representative who has indicated his/her understanding and acceptance.       Plan Discussed with: CRNA and Surgeon  Anesthesia Plan Comments: (Per cardiology 01/22/2020, "Pre-op clearance -Despite spinal stenosis she is able to get 4 METS of activity.  She can walk with walker.  She is going on stairs. Does household chores without angina.   Per pharmacist "Per office protocol, patient can holdwarfarinfor 5days prior to procedure. Patientwill NOTneed bridging with Lovenox (enoxaparin) around procedure".  Given past medical history and time since last visit, based on  ACC/AHA guidelines, Latasha Hodges would be at acceptable risk for the planned procedure without further cardiovascular testing"  )       Anesthesia Quick Evaluation

## 2020-01-26 ENCOUNTER — Other Ambulatory Visit (HOSPITAL_COMMUNITY)
Admission: RE | Admit: 2020-01-26 | Discharge: 2020-01-26 | Disposition: A | Discharge status: Home / Self Care | Payer: Medicare Other | Source: Ambulatory Visit | Attending: Urology | Admitting: Urology

## 2020-01-26 DIAGNOSIS — Z20822 Contact with and (suspected) exposure to covid-19: Secondary | ICD-10-CM | POA: Insufficient documentation

## 2020-01-26 DIAGNOSIS — Z01812 Encounter for preprocedural laboratory examination: Secondary | ICD-10-CM | POA: Insufficient documentation

## 2020-01-26 LAB — SARS CORONAVIRUS 2 (TAT 6-24 HRS): SARS Coronavirus 2: NEGATIVE

## 2020-01-28 NOTE — H&P (Signed)
Office Visit Report     01/06/2020   --------------------------------------------------------------------------------   Latasha Hodges  MRN: [id number]  DOB: March 18, 1945, 74 year old Female  SSN:    PRIMARY CARE:    REFERRING:  Milford Cage. Laurann Montana, MD  PROVIDER:  Raynelle Bring, M.D.  LOCATION:  [facility] [phone]     --------------------------------------------------------------------------------   CC/HPI: CC: Right renal neoplasm  Physician requesting consult: Dr. Kelton Pillar   [name] is a 74 year old female who was undergoing an MRI of the lumbar spine for back pain by Dr. Sherley Bounds. She was incidentally noted to have a right renal mass prompting a dedicated MRI of the abdomen with and without IV contrast for evaluation. This demonstrated a 6.9 cm exophytic mass of the posterior upper pole right kidney with enhancement with a filling defect extending into the renal vein but not the IVC. Additional findings suggest prominent collateral vessels to the upper pole of the right kidney, a probable 2nd 1.6 cm enhancing tumor in the lower pole of the right kidney. There is a 9 mm non-pathologically enlarged lymph node in the perihilar region.   Family history of kidney cancer: None.  Family history of ESRD: No.   Imaging: MRI (12/28/19)  Side of renal neoplasm: Right  Size of renal neoplasm: 6.9 cm  Location of renal neoplasm: Upper pole  Exophytic or endophytic: Exophytic   Renal artery anatomy: Single renal artery  Renal vein anatomy: Single renal vein with large collateral to upper pole and renal vein thrombus   Contralateral renal lesions: None  Regional lymphadenopathy: 9 mm right perihilar non-pathologically enlarge lymph node. No other lymphadenopathy.  Adrenal masses: None.  Renal vein/IVC involvement: See above.  Metastatic disease to the abdomen: No.   Chest imaging: PENDING  LFTs: PENDING   Baseline renal function: Cr 0.72, eGFR >  60 ml/min   PMH: Past medical history is significant for hypertension, atrial fibrillation (on Coumadin), hyperlipidemia, diabetes, and asthma.  Allergies: She has allergies to PCN, CT IV contrast/iodine, and sulfa medications.  PSH: Tubal ligation.     ALLERGIES: Iodinated Diagnostic Agents Iodine Iohexol penicillin sulfa    MEDICATIONS: Lisinopril 20 mg tablet  Metoprolol Succinate 50 mg tablet, extended release 24 hr  Simvastatin 40 mg tablet  Warfarin Sodium 5 mg tablet  Atrovent Hfa 17 mcg/actuation hfa aerosol with adapter  Fenofibrate  Flecainide Acetate 50 mg tablet  Ipratropium Bromide 0.2 mg/ml (0.02 %) solution, non-oral  Magnesium 400 mg magnesium capsule  Magnesium Oxide 811 mg capsule  Trulicity 1.5 BJ/4.7 ml pen injector     GU PSH: None   NON-GU PSH: Bilateral Tubal Ligation     GU PMH: None   NON-GU PMH: Asthma Atrial Fibrillation Diabetes Type 2 Hypercholesterolemia Hypertension    FAMILY HISTORY: 1 Daughter - Runs in Family 1 son - Runs in Family Brain Cancer - Mother CAD (coronary artery disease) - Father   SOCIAL HISTORY: Marital Status: Divorced Preferred Language: English; Ethnicity: Not Hispanic Or Latino; Race: White Current Smoking Status: Patient does not smoke anymore.   Tobacco Use Assessment Completed: Used Tobacco in last 30 days? Social Drinker.  Does not drink caffeine.    REVIEW OF SYSTEMS:    GU Review Female:   Patient reports get up at night to urinate and leakage of urine. Patient denies frequent urination, hard to postpone urination, burning /pain with urination, stream starts and stops, trouble starting your stream, have to strain to urinate,  and currently pregnant.  Gastrointestinal (Lower):   Patient denies diarrhea and constipation.  Gastrointestinal (Upper):   Patient denies vomiting and nausea.  Constitutional:   Patient denies fever, night sweats, weight loss, and fatigue.  Skin:   Patient denies skin rash/  lesion and itching.  Eyes:   Patient denies blurred vision and double vision.  Ears/ Nose/ Throat:   Patient denies sore throat and sinus problems.  Hematologic/Lymphatic:   Patient denies swollen glands and easy bruising.  Cardiovascular:   Patient denies leg swelling and chest pains.  Respiratory:   Patient reports cough. Patient denies shortness of breath.  Endocrine:   Patient denies excessive thirst.  Musculoskeletal:   Patient reports back pain. Patient denies joint pain.  Neurological:   Patient denies headaches and dizziness.  Psychologic:   Patient denies depression and anxiety.   VITAL SIGNS:      01/06/2020 08:02 AM  Weight 215 lb / 97.52 kg  Height 77 in / 195.58 cm  BP 156/71 mmHg  Pulse 77 /min  Temperature 96.8 F / 36 C  BMI 25.5 kg/m   MULTI-SYSTEM PHYSICAL EXAMINATION:    Constitutional: Well-nourished. No physical deformities. Normally developed. Good grooming.  Neck: Neck symmetrical, not swollen. Normal tracheal position.  Respiratory: No labored breathing, no use of accessory muscles. Clear bilaterally.  Cardiovascular: Normal temperature, normal extremity pulses, no swelling, no varicosities. Regular rate and rhythm.  Lymphatic: No enlargement of neck, axillae, groin.  Skin: No paleness, no jaundice, no cyanosis. No lesion, no ulcer, no rash.  Neurologic / Psychiatric: Oriented to time, oriented to place, oriented to person. No depression, no anxiety, no agitation.  Gastrointestinal: No mass, no tenderness, no rigidity, obese abdomen. Small periumbilical scar.  Eyes: Normal conjunctivae. Normal eyelids.  Ears, Nose, Mouth, and Throat: Left ear no scars, no lesions, no masses. Right ear no scars, no lesions, no masses. Nose no scars, no lesions, no masses. Normal hearing. Normal lips.  Musculoskeletal: Normal gait and station of head and neck.     Complexity of Data:  Records Review:   Previous Patient Records  X-Ray Review: MRI Abdomen: Reviewed Films.     Notes:                     Was likeCLINICAL DATA: Right renal mass on recent lumbar spine MRI.   EXAM:  MRI ABDOMEN WITHOUT AND WITH CONTRAST   TECHNIQUE:  Multiplanar multisequence MR imaging of the abdomen was performed  both before and after the administration of intravenous contrast.   CONTRAST: 68mL MULTIHANCE GADOBENATE DIMEGLUMINE 529 MG/ML IV SOLN   COMPARISON: Lumbar spine MRI 12/20/2019   FINDINGS:  Lower chest: Unremarkable.   Hepatobiliary: Diffuse loss of signal intensity on out of phase T1  imaging is compatible with fatty deposition in the liver parenchyma.  11 mm irregular cyst noted posterior right liver There is no  evidence for gallstones, gallbladder wall thickening, or  pericholecystic fluid. No intrahepatic or extrahepatic biliary  dilation.   Pancreas: No focal mass lesion. No dilatation of the main duct. No  intraparenchymal cyst. No peripancreatic edema.   Spleen: No splenomegaly. No focal mass lesion.   Adrenals/Urinary Tract: No adrenal nodule or mass.   6.6 x 6.9 x 4.9 cm exophytic mass is identified in the posterior  upper interpolar right kidney. Lesion extends into the posterior lip  of the interpolar right kidney with filling defects in the upper and  interpolar venous anatomy. Dominant filling defect in  the upper pole  region of the right renal venous anatomy expands the vessel and  appears to have enhancing components consistent with tumor thrombus.  Thrombus does not extend into the IVC.   There is a second lesion in the extreme inferior pole right kidney  measuring 1.6 x 1.6 cm with heterogeneous enhancement (delayed  postcontrast axial image 72 of series 18 and coronal postcontrast  image 45 of series 17). This is also well demonstrated on coronal T2  image 25 of series 2.   No suspicious enhancing lesion in the left kidney. Very tiny foci of  non enhancement are probably cortical cysts.   Stomach/Bowel: Stomach is unremarkable. No  gastric wall thickening.  No evidence of outlet obstruction. Duodenum is normally positioned  as is the ligament of Treitz. No small bowel or colonic dilatation  within the visualized abdomen.   Vascular/Lymphatic: No abdominal aortic aneurysm. There is no  gastrohepatic or hepatoduodenal ligament lymphadenopathy. No  retroperitoneal or mesenteric lymphadenopathy. 9 mm short axis  enhancing lymph node is seen in the posterior right renal hilar  region, adjacent to the psoas muscle (see image 59 of series 15,  concerning for nodal metastasis.   Other: No intraperitoneal free fluid.   Musculoskeletal: No focal suspicious marrow enhancement within the  visualized bony anatomy.   IMPRESSION:  1. 6.6 x 6.9 x 4.9 cm exophytic mass in the posterior upper  interpolar right kidney that extends into the posterior lip of the  interpolar kidney. Imaging features consistent with renal cell  carcinoma. Thrombus in the right renal vein appears to have  enhancing components suggesting tumor thrombus. No extension of  thrombus into the IVC. Lesion extends towards the liver, but coronal  and axial imaging today suggests preservation of fat plane between  the renal mass and the liver capsule. Although not a dedicated MRA  exam, only a single right renal artery is identified with prominent  vascular collateralization superior to and posterior to the right  kidney.  2. There is a second lesion in the extreme inferior pole right  kidney measuring 1.6 x 1.6 cm and concerning for a second primary  neoplasm.  3. 9 mm short axis enhancing lymph node in the posterior right renal  hilar region, concerning for nodal metastasis.  4. Hepatic steatosis.    Electronically Signed  By: Misty Stanley M.D.  On: 12/28/2019 11:23   PROCEDURES:          Urinalysis - 81003 Dipstick Dipstick Cont'd  Color: Yellow Bilirubin: Neg mg/dL  Appearance: Clear Ketones: Neg mg/dL  Specific Gravity: 1.025 Blood: Neg  ery/uL  pH: <=5.0 Protein: Neg mg/dL  Glucose: Neg mg/dL Urobilinogen: 0.2 mg/dL    Nitrites: Neg    Leukocyte Esterase: Neg leu/uL    ASSESSMENT:      ICD-10 Details  1 GU:   Right renal neoplasm - D49.511    PLAN:           Orders Labs CBC with Diff, CMP, LDH          Schedule X-Rays: 1 Week - C.T. Chest Without Contrast - Next available.  Return Visit/Planned Activity: Other See Visit Notes             Note: Will call with labs and CT results. Will also be calling to schedule surgery.          Document Letter(s):  Created for Patient: Clinical Summary         Notes:   1.  Right renal neoplasm concerning for malignancy: I had a detailed discussion with Ms. Leisner and her daughter (who is an Nutritional therapist at [employer] who spends time with her student on the 4th floor at [employer]). We discussed her MRI findings in detail today that his suggestive of a locally advanced renal cell carcinoma with evidence of renal vein thrombus but without evidence to suggest metastatic disease. We discussed the need to proceed with a CT scan of the chest for further staging evaluation and laboratory studies today to evaluate her renal function, liver function, complete blood count, and LDH.   The patient was provided information regarding their renal mass including the relative risk of benign versus malignant pathology and the natural history of renal cell carcinoma and other possible malignancies of the kidney. The role of renal biopsy, laboratory testing, and imaging studies to further characterize renal masses and/or the presence of metastatic disease were explained. We discussed the role of active surveillance, surgical therapy with both radical nephrectomy and nephron-sparing surgery, and ablative therapy in the treatment of renal masses. In addition, we discussed our goals of providing an accurate diagnosis and oncologic control while maintaining optimal renal function as appropriate based on the size,  location, and complexity of their renal mass as well as their co-morbidities. We have discussed the risks of treatment in detail including but not limited to bleeding, infection, heart attack, stroke, death, venothromoboembolism, cancer recurrence, injury/damage to surrounding organs and structures, urine leak, the possibility of open surgical conversion for patients undergoing minimally invasive surgery, the risk of developing chronic kidney disease and its associated implications, and the potential risk of end stage renal disease possibly necessitating dialysis.   She will proceed with completion of her metastatic evaluation including chest imaging and laboratory studies. I will tentatively plan to schedule her for a right robot assisted laparoscopic radical nephrectomy. We discuss the approach and the possibility that we might proceed with a lymphadenectomy depending on intraoperative findings. I will plan to obtain cardiac clearance/risk assessment from her cardiologist, Dr. Fransico Him. She would need to stop her Coumadin 5 days preoperatively and we will plan to check her INR the morning of surgery.   Cc: Dr. Kelton Pillar  Dr. Sherley Bounds  Dr. Fransico Him      * Signed by Raynelle Bring, M.D. on 01/06/20 at 9:01 PM (EST*

## 2020-01-29 ENCOUNTER — Other Ambulatory Visit (HOSPITAL_COMMUNITY): Payer: Self-pay | Admitting: Physician Assistant

## 2020-01-29 ENCOUNTER — Encounter (HOSPITAL_COMMUNITY)
Admission: RE | Disposition: A | Discharge status: Home / Self Care | Payer: Self-pay | Source: Home / Self Care | Attending: Urology

## 2020-01-29 ENCOUNTER — Encounter (HOSPITAL_COMMUNITY): Payer: Self-pay | Admitting: Urology

## 2020-01-29 ENCOUNTER — Inpatient Hospital Stay (HOSPITAL_COMMUNITY): Payer: Medicare Other | Admitting: Physician Assistant

## 2020-01-29 ENCOUNTER — Inpatient Hospital Stay (HOSPITAL_COMMUNITY): Payer: Medicare Other | Admitting: Certified Registered Nurse Anesthetist

## 2020-01-29 ENCOUNTER — Inpatient Hospital Stay (HOSPITAL_COMMUNITY)
Admission: RE | Admit: 2020-01-29 | Discharge: 2020-02-01 | DRG: 658 | Disposition: A | Discharge status: Home / Self Care | Payer: Medicare Other | Attending: Urology | Admitting: Urology

## 2020-01-29 ENCOUNTER — Other Ambulatory Visit: Payer: Self-pay

## 2020-01-29 DIAGNOSIS — Z91041 Radiographic dye allergy status: Secondary | ICD-10-CM

## 2020-01-29 DIAGNOSIS — Z880 Allergy status to penicillin: Secondary | ICD-10-CM | POA: Diagnosis not present

## 2020-01-29 DIAGNOSIS — Z87891 Personal history of nicotine dependence: Secondary | ICD-10-CM | POA: Diagnosis not present

## 2020-01-29 DIAGNOSIS — D49511 Neoplasm of unspecified behavior of right kidney: Secondary | ICD-10-CM | POA: Diagnosis present

## 2020-01-29 DIAGNOSIS — J45909 Unspecified asthma, uncomplicated: Secondary | ICD-10-CM | POA: Diagnosis present

## 2020-01-29 DIAGNOSIS — C641 Malignant neoplasm of right kidney, except renal pelvis: Principal | ICD-10-CM | POA: Diagnosis present

## 2020-01-29 DIAGNOSIS — I4891 Unspecified atrial fibrillation: Secondary | ICD-10-CM | POA: Diagnosis present

## 2020-01-29 DIAGNOSIS — Z91013 Allergy to seafood: Secondary | ICD-10-CM

## 2020-01-29 DIAGNOSIS — Z8249 Family history of ischemic heart disease and other diseases of the circulatory system: Secondary | ICD-10-CM

## 2020-01-29 DIAGNOSIS — E785 Hyperlipidemia, unspecified: Secondary | ICD-10-CM | POA: Diagnosis present

## 2020-01-29 DIAGNOSIS — E119 Type 2 diabetes mellitus without complications: Secondary | ICD-10-CM | POA: Diagnosis present

## 2020-01-29 DIAGNOSIS — Z7901 Long term (current) use of anticoagulants: Secondary | ICD-10-CM

## 2020-01-29 DIAGNOSIS — Z888 Allergy status to other drugs, medicaments and biological substances status: Secondary | ICD-10-CM | POA: Diagnosis not present

## 2020-01-29 DIAGNOSIS — I10 Essential (primary) hypertension: Secondary | ICD-10-CM | POA: Diagnosis present

## 2020-01-29 DIAGNOSIS — E7800 Pure hypercholesterolemia, unspecified: Secondary | ICD-10-CM | POA: Diagnosis present

## 2020-01-29 DIAGNOSIS — Z882 Allergy status to sulfonamides status: Secondary | ICD-10-CM | POA: Diagnosis not present

## 2020-01-29 DIAGNOSIS — K760 Fatty (change of) liver, not elsewhere classified: Secondary | ICD-10-CM | POA: Diagnosis present

## 2020-01-29 DIAGNOSIS — Z20822 Contact with and (suspected) exposure to covid-19: Secondary | ICD-10-CM | POA: Diagnosis present

## 2020-01-29 DIAGNOSIS — Z808 Family history of malignant neoplasm of other organs or systems: Secondary | ICD-10-CM | POA: Diagnosis not present

## 2020-01-29 HISTORY — PX: ROBOT ASSISTED LAPAROSCOPIC NEPHRECTOMY: SHX5140

## 2020-01-29 LAB — TYPE AND SCREEN
ABO/RH(D): O POS
Antibody Screen: NEGATIVE

## 2020-01-29 LAB — BASIC METABOLIC PANEL
Anion gap: 12 (ref 5–15)
BUN: 22 mg/dL (ref 8–23)
CO2: 22 mmol/L (ref 22–32)
Calcium: 9.1 mg/dL (ref 8.9–10.3)
Chloride: 101 mmol/L (ref 98–111)
Creatinine, Ser: 1.05 mg/dL — ABNORMAL HIGH (ref 0.44–1.00)
GFR, Estimated: 56 mL/min — ABNORMAL LOW (ref >60)
Glucose, Bld: 274 mg/dL — ABNORMAL HIGH (ref 70–99)
Potassium: 4 mmol/L (ref 3.5–5.1)
Sodium: 135 mmol/L (ref 135–145)

## 2020-01-29 LAB — GLUCOSE, CAPILLARY
Glucose-Capillary: 150 mg/dL — ABNORMAL HIGH (ref 70–99)
Glucose-Capillary: 212 mg/dL — ABNORMAL HIGH (ref 70–99)
Glucose-Capillary: 239 mg/dL — ABNORMAL HIGH (ref 70–99)
Glucose-Capillary: 248 mg/dL — ABNORMAL HIGH (ref 70–99)

## 2020-01-29 LAB — HEMOGLOBIN AND HEMATOCRIT, BLOOD
HCT: 37.7 % (ref 36.0–46.0)
Hemoglobin: 11.8 g/dL — ABNORMAL LOW (ref 12.0–15.0)

## 2020-01-29 LAB — ABO/RH: ABO/RH(D): O POS

## 2020-01-29 SURGERY — NEPHRECTOMY, RADICAL, ROBOT-ASSISTED, LAPAROSCOPIC, ADULT
Anesthesia: General

## 2020-01-29 MED ORDER — HYDROMORPHONE HCL 1 MG/ML IJ SOLN
INTRAMUSCULAR | Status: AC
  Filled 2020-01-29: qty 1

## 2020-01-29 MED ORDER — MEPERIDINE HCL 50 MG/ML IJ SOLN: 6.2500 mg | INTRAMUSCULAR | Status: DC | PRN

## 2020-01-29 MED ORDER — DOCUSATE SODIUM 100 MG PO CAPS
100.0000 mg | ORAL_CAPSULE | Freq: Two times a day (BID) | ORAL | Status: DC
  Administered 2020-01-29 – 2020-02-01 (×6): 100 mg via ORAL
  Filled 2020-01-29 (×6): qty 1

## 2020-01-29 MED ORDER — METOPROLOL SUCCINATE ER 50 MG PO TB24
50.0000 mg | ORAL_TABLET | Freq: Every day | ORAL | Status: DC
  Administered 2020-01-29 – 2020-01-31 (×3): 50 mg via ORAL
  Filled 2020-01-29 (×3): qty 1

## 2020-01-29 MED ORDER — INSULIN ASPART 100 UNIT/ML ~~LOC~~ SOLN
SUBCUTANEOUS | Status: AC
  Filled 2020-01-29: qty 1

## 2020-01-29 MED ORDER — SODIUM CHLORIDE (PF) 0.9 % IJ SOLN
INTRAMUSCULAR | Status: AC
  Filled 2020-01-29: qty 20

## 2020-01-29 MED ORDER — HYDROMORPHONE HCL 1 MG/ML IJ SOLN
0.2500 mg | INTRAMUSCULAR | Status: DC | PRN
  Administered 2020-01-29 (×2): 0.5 mg via INTRAVENOUS

## 2020-01-29 MED ORDER — PROPOFOL 10 MG/ML IV BOLUS
INTRAVENOUS | Status: AC
  Filled 2020-01-29: qty 20

## 2020-01-29 MED ORDER — MAGNESIUM CITRATE PO SOLN: 0.5000 | Freq: Once | ORAL | Status: DC

## 2020-01-29 MED ORDER — LACTATED RINGERS IV SOLN: INTRAVENOUS | Status: DC | PRN

## 2020-01-29 MED ORDER — ONDANSETRON HCL 4 MG/2ML IJ SOLN
INTRAMUSCULAR | Status: DC | PRN
  Administered 2020-01-29: 4 mg via INTRAVENOUS

## 2020-01-29 MED ORDER — FENTANYL CITRATE (PF) 250 MCG/5ML IJ SOLN
INTRAMUSCULAR | Status: DC | PRN
  Administered 2020-01-29: 50 ug via INTRAVENOUS
  Administered 2020-01-29: 100 ug via INTRAVENOUS
  Administered 2020-01-29 (×4): 50 ug via INTRAVENOUS

## 2020-01-29 MED ORDER — CEFAZOLIN SODIUM-DEXTROSE 1-4 GM/50ML-% IV SOLN
1.0000 g | Freq: Three times a day (TID) | INTRAVENOUS | Status: AC
  Administered 2020-01-29 – 2020-01-30 (×2): 1 g via INTRAVENOUS
  Filled 2020-01-29 (×2): qty 50

## 2020-01-29 MED ORDER — MIDAZOLAM HCL 5 MG/5ML IJ SOLN
INTRAMUSCULAR | Status: DC | PRN
  Administered 2020-01-29: 2 mg via INTRAVENOUS

## 2020-01-29 MED ORDER — ROCURONIUM BROMIDE 10 MG/ML (PF) SYRINGE
PREFILLED_SYRINGE | INTRAVENOUS | Status: AC
  Filled 2020-01-29: qty 20

## 2020-01-29 MED ORDER — LISINOPRIL 20 MG PO TABS
20.0000 mg | ORAL_TABLET | Freq: Every day | ORAL | Status: DC
  Administered 2020-01-29 – 2020-01-31 (×3): 20 mg via ORAL
  Filled 2020-01-29 (×3): qty 1

## 2020-01-29 MED ORDER — DIPHENHYDRAMINE HCL 50 MG/ML IJ SOLN: 12.5000 mg | Freq: Four times a day (QID) | INTRAMUSCULAR | Status: DC | PRN

## 2020-01-29 MED ORDER — SUGAMMADEX SODIUM 200 MG/2ML IV SOLN
INTRAVENOUS | Status: DC | PRN
  Administered 2020-01-29: 400 mg via INTRAVENOUS

## 2020-01-29 MED ORDER — CEFAZOLIN SODIUM-DEXTROSE 2-4 GM/100ML-% IV SOLN
2.0000 g | Freq: Once | INTRAVENOUS | Status: AC
  Administered 2020-01-29: 13:00:00 2 g via INTRAVENOUS
  Filled 2020-01-29: qty 100

## 2020-01-29 MED ORDER — DEXAMETHASONE SODIUM PHOSPHATE 10 MG/ML IJ SOLN
INTRAMUSCULAR | Status: DC | PRN
  Administered 2020-01-29: 4 mg via INTRAVENOUS

## 2020-01-29 MED ORDER — LACTATED RINGERS IV SOLN
INTRAVENOUS | Status: AC | PRN
  Administered 2020-01-29: 1000 mL

## 2020-01-29 MED ORDER — HYDROMORPHONE HCL 1 MG/ML IJ SOLN
0.5000 mg | INTRAMUSCULAR | Status: DC | PRN
Start: 2020-01-29 — End: 2020-01-30
  Administered 2020-01-29 – 2020-01-30 (×2): 1 mg via INTRAVENOUS
  Filled 2020-01-29 (×2): qty 1

## 2020-01-29 MED ORDER — CHLORHEXIDINE GLUCONATE 0.12 % MT SOLN
15.0000 mL | Freq: Once | OROMUCOSAL | Status: AC
  Administered 2020-01-29: 12:00:00 15 mL via OROMUCOSAL

## 2020-01-29 MED ORDER — ORAL CARE MOUTH RINSE: 15.0000 mL | Freq: Once | OROMUCOSAL | Status: AC

## 2020-01-29 MED ORDER — TRAMADOL HCL 50 MG PO TABS: 50.0000 mg | ORAL_TABLET | Freq: Four times a day (QID) | ORAL | 0 refills | Status: DC | PRN

## 2020-01-29 MED ORDER — LABETALOL HCL 5 MG/ML IV SOLN
INTRAVENOUS | Status: AC
  Filled 2020-01-29: qty 4

## 2020-01-29 MED ORDER — LACTATED RINGERS IV SOLN: INTRAVENOUS | Status: DC

## 2020-01-29 MED ORDER — SIMVASTATIN 40 MG PO TABS
40.0000 mg | ORAL_TABLET | Freq: Every day | ORAL | Status: DC
Start: 2020-01-30 — End: 2020-02-01
  Administered 2020-01-30 – 2020-01-31 (×2): 40 mg via ORAL
  Filled 2020-01-29 (×3): qty 1

## 2020-01-29 MED ORDER — ORAL CARE MOUTH RINSE
15.0000 mL | Freq: Two times a day (BID) | OROMUCOSAL | Status: DC
  Administered 2020-01-29 – 2020-02-01 (×6): 15 mL via OROMUCOSAL

## 2020-01-29 MED ORDER — FLECAINIDE ACETATE 50 MG PO TABS
50.0000 mg | ORAL_TABLET | Freq: Two times a day (BID) | ORAL | Status: DC
  Administered 2020-01-29 – 2020-02-01 (×6): 50 mg via ORAL
  Filled 2020-01-29 (×6): qty 1

## 2020-01-29 MED ORDER — ONDANSETRON HCL 4 MG/2ML IJ SOLN: 4.0000 mg | Freq: Once | INTRAMUSCULAR | Status: DC | PRN

## 2020-01-29 MED ORDER — INSULIN ASPART 100 UNIT/ML ~~LOC~~ SOLN
0.0000 [IU] | Freq: Three times a day (TID) | SUBCUTANEOUS | Status: DC
  Administered 2020-01-29: 5 [IU] via SUBCUTANEOUS

## 2020-01-29 MED ORDER — STERILE WATER FOR IRRIGATION IR SOLN
Status: DC | PRN
  Administered 2020-01-29: 1000 mL

## 2020-01-29 MED ORDER — LABETALOL HCL 5 MG/ML IV SOLN
INTRAVENOUS | Status: DC | PRN
  Administered 2020-01-29: 5 mg via INTRAVENOUS
  Administered 2020-01-29: 10 mg via INTRAVENOUS
  Administered 2020-01-29: 5 mg via INTRAVENOUS

## 2020-01-29 MED ORDER — DIPHENHYDRAMINE HCL 12.5 MG/5ML PO ELIX: 12.5000 mg | ORAL_SOLUTION | Freq: Four times a day (QID) | ORAL | Status: DC | PRN

## 2020-01-29 MED ORDER — MIDAZOLAM HCL 2 MG/2ML IJ SOLN
INTRAMUSCULAR | Status: AC
  Filled 2020-01-29: qty 2

## 2020-01-29 MED ORDER — BUPIVACAINE LIPOSOME 1.3 % IJ SUSP
20.0000 mL | Freq: Once | INTRAMUSCULAR | Status: AC
  Administered 2020-01-29: 16:00:00 20 mL
  Filled 2020-01-29: qty 20

## 2020-01-29 MED ORDER — INSULIN ASPART 100 UNIT/ML ~~LOC~~ SOLN
0.0000 [IU] | SUBCUTANEOUS | Status: DC
  Administered 2020-01-29 – 2020-01-30 (×3): 5 [IU] via SUBCUTANEOUS
  Administered 2020-01-30: 05:00:00 3 [IU] via SUBCUTANEOUS
  Administered 2020-01-30: 12:00:00 2 [IU] via SUBCUTANEOUS
  Administered 2020-01-31 (×3): 3 [IU] via SUBCUTANEOUS
  Administered 2020-01-31: 5 [IU] via SUBCUTANEOUS
  Administered 2020-01-31: 05:00:00 2 [IU] via SUBCUTANEOUS
  Administered 2020-01-31 (×2): 3 [IU] via SUBCUTANEOUS
  Administered 2020-02-01: 09:00:00 2 [IU] via SUBCUTANEOUS
  Administered 2020-02-01: 05:00:00 3 [IU] via SUBCUTANEOUS

## 2020-01-29 MED ORDER — LIDOCAINE 2% (20 MG/ML) 5 ML SYRINGE
INTRAMUSCULAR | Status: DC | PRN
  Administered 2020-01-29: 100 mg via INTRAVENOUS

## 2020-01-29 MED ORDER — FENTANYL CITRATE (PF) 100 MCG/2ML IJ SOLN
INTRAMUSCULAR | Status: AC
  Filled 2020-01-29: qty 2

## 2020-01-29 MED ORDER — FENTANYL CITRATE (PF) 250 MCG/5ML IJ SOLN
INTRAMUSCULAR | Status: AC
  Filled 2020-01-29: qty 5

## 2020-01-29 MED ORDER — PROPOFOL 10 MG/ML IV BOLUS
INTRAVENOUS | Status: DC | PRN
  Administered 2020-01-29: 110 mg via INTRAVENOUS

## 2020-01-29 MED ORDER — ONDANSETRON HCL 4 MG/2ML IJ SOLN
4.0000 mg | INTRAMUSCULAR | Status: DC | PRN
  Administered 2020-01-29: 4 mg via INTRAVENOUS
  Filled 2020-01-29: qty 2

## 2020-01-29 MED ORDER — ROCURONIUM BROMIDE 10 MG/ML (PF) SYRINGE
PREFILLED_SYRINGE | INTRAVENOUS | Status: DC | PRN
  Administered 2020-01-29: 100 mg via INTRAVENOUS
  Administered 2020-01-29: 30 mg via INTRAVENOUS

## 2020-01-29 MED ORDER — SODIUM CHLORIDE 0.45 % IV SOLN: INTRAVENOUS | Status: DC

## 2020-01-29 MED ORDER — ACETAMINOPHEN 10 MG/ML IV SOLN
1000.0000 mg | Freq: Four times a day (QID) | INTRAVENOUS | Status: AC
  Administered 2020-01-29 – 2020-01-30 (×2): 1000 mg via INTRAVENOUS
  Filled 2020-01-29 (×2): qty 100

## 2020-01-29 MED ORDER — SODIUM CHLORIDE (PF) 0.9 % IJ SOLN
INTRAMUSCULAR | Status: DC | PRN
  Administered 2020-01-29: 20 mL

## 2020-01-29 MED ORDER — ENOXAPARIN SODIUM 40 MG/0.4ML ~~LOC~~ SOLN
40.0000 mg | SUBCUTANEOUS | Status: DC
  Administered 2020-01-30 – 2020-02-01 (×3): 40 mg via SUBCUTANEOUS
  Filled 2020-01-29 (×4): qty 0.4

## 2020-01-29 MED FILL — traMADol HCL 50 MG TABS: 50 | 3 days supply | Qty: 20 | Fill #0

## 2020-01-29 SURGICAL SUPPLY — 62 items
APPLICATOR SURGIFLO ENDO (HEMOSTASIS) IMPLANT
BAG LAPAROSCOPIC 12 15 PORT 16 (BASKET) ×1 IMPLANT
BAG RETRIEVAL 12/15 (BASKET) ×2
BAG RETRIEVAL 12/15MM (BASKET) ×1
CHLORAPREP W/TINT 26 (MISCELLANEOUS) ×6 IMPLANT
CLIP VESOLOCK LG 6/CT PURPLE (CLIP) ×6 IMPLANT
CLIP VESOLOCK MED LG 6/CT (CLIP) ×3 IMPLANT
COVER SURGICAL LIGHT HANDLE (MISCELLANEOUS) ×3 IMPLANT
COVER TIP SHEARS 8 DVNC (MISCELLANEOUS) ×1 IMPLANT
COVER TIP SHEARS 8MM DA VINCI (MISCELLANEOUS) ×2
COVER WAND RF STERILE (DRAPES) IMPLANT
CUTTER ECHEON FLEX ENDO 45 340 (ENDOMECHANICALS) ×3 IMPLANT
DECANTER SPIKE VIAL GLASS SM (MISCELLANEOUS) ×3 IMPLANT
DERMABOND ADVANCED (GAUZE/BANDAGES/DRESSINGS) ×2
DERMABOND ADVANCED .7 DNX12 (GAUZE/BANDAGES/DRESSINGS) ×1 IMPLANT
DRAIN CHANNEL 15F RND FF 3/16 (WOUND CARE) IMPLANT
DRAPE ARM DVNC X/XI (DISPOSABLE) ×4 IMPLANT
DRAPE COLUMN DVNC XI (DISPOSABLE) ×1 IMPLANT
DRAPE DA VINCI XI ARM (DISPOSABLE) ×8
DRAPE DA VINCI XI COLUMN (DISPOSABLE) ×2
DRAPE INCISE IOBAN 66X45 STRL (DRAPES) ×3 IMPLANT
DRAPE SHEET LG 3/4 BI-LAMINATE (DRAPES) ×3 IMPLANT
ELECT REM PT RETURN 15FT ADLT (MISCELLANEOUS) ×3 IMPLANT
EVACUATOR SILICONE 100CC (DRAIN) IMPLANT
GLOVE BIO SURGEON STRL SZ 6.5 (GLOVE) ×2 IMPLANT
GLOVE BIO SURGEONS STRL SZ 6.5 (GLOVE) ×1
GLOVE BIOGEL M STRL SZ7.5 (GLOVE) ×6 IMPLANT
GOWN STRL REUS W/TWL LRG LVL3 (GOWN DISPOSABLE) ×9 IMPLANT
IRRIG SUCT STRYKERFLOW 2 WTIP (MISCELLANEOUS) ×3
IRRIGATION SUCT STRKRFLW 2 WTP (MISCELLANEOUS) ×1 IMPLANT
KIT BASIN OR (CUSTOM PROCEDURE TRAY) ×3 IMPLANT
KIT TURNOVER KIT A (KITS) IMPLANT
NS IRRIG 1000ML POUR BTL (IV SOLUTION) ×3 IMPLANT
PENCIL SMOKE EVACUATOR (MISCELLANEOUS) IMPLANT
POUCH SPECIMEN RETRIEVAL 10MM (ENDOMECHANICALS) ×3 IMPLANT
PROTECTOR NERVE ULNAR (MISCELLANEOUS) ×6 IMPLANT
SEAL CANN UNIV 5-8 DVNC XI (MISCELLANEOUS) ×4 IMPLANT
SEAL XI 5MM-8MM UNIVERSAL (MISCELLANEOUS) ×8
SET TUBE SMOKE EVAC HIGH FLOW (TUBING) ×3 IMPLANT
SOLUTION ELECTROLUBE (MISCELLANEOUS) ×3 IMPLANT
STAPLE RELOAD 45 WHT (STAPLE) ×1 IMPLANT
STAPLE RELOAD 45MM WHITE (STAPLE) ×2
SURGIFLO W/THROMBIN 8M KIT (HEMOSTASIS) IMPLANT
SUT ETHILON 3 0 PS 1 (SUTURE) IMPLANT
SUT MNCRL AB 4-0 PS2 18 (SUTURE) ×9 IMPLANT
SUT PDS AB 0 CTX 36 PDP370T (SUTURE) IMPLANT
SUT PDS AB 1 CTX 36 (SUTURE) ×6 IMPLANT
SUT V-LOC BARB 180 2/0GR6 GS22 (SUTURE)
SUT VIC AB 0 CT1 27 (SUTURE) ×2
SUT VIC AB 0 CT1 27XBRD ANTBC (SUTURE) ×1 IMPLANT
SUT VICRYL 0 UR6 27IN ABS (SUTURE) ×3 IMPLANT
SUT VLOC BARB 180 ABS3/0GR12 (SUTURE)
SUTURE V-LC BRB 180 2/0GR6GS22 (SUTURE) IMPLANT
SUTURE VLOC BRB 180 ABS3/0GR12 (SUTURE) IMPLANT
TOWEL OR 17X26 10 PK STRL BLUE (TOWEL DISPOSABLE) ×3 IMPLANT
TOWEL OR NON WOVEN STRL DISP B (DISPOSABLE) ×3 IMPLANT
TRAY FOLEY MTR SLVR 16FR STAT (SET/KITS/TRAYS/PACK) ×3 IMPLANT
TRAY LAPAROSCOPIC (CUSTOM PROCEDURE TRAY) ×3 IMPLANT
TROCAR BLADELESS OPT 5 100 (ENDOMECHANICALS) ×3 IMPLANT
TROCAR UNIVERSAL OPT 12M 100M (ENDOMECHANICALS) ×3 IMPLANT
TROCAR XCEL 12X100 BLDLESS (ENDOMECHANICALS) ×3 IMPLANT
WATER STERILE IRR 1000ML POUR (IV SOLUTION) ×6 IMPLANT

## 2020-01-29 NOTE — Op Note (Signed)
Preoperative diagnosis: Right renal neoplasm with renal vein thrombus  Postoperative diagnosis: Right renal neoplasm with renal vein thrombus  Procedure:  1. Right robotic-assisted laparoscopic radical nephrectomy  Surgeon: Pryor Curia. M.D.  Assistant(s): Debbrah Alar, PA-C  An assistant was required for this surgical procedure.  The duties of the assistant included but were not limited to suctioning, passing suture, camera manipulation, retraction. This procedure would not be able to be performed without an Environmental consultant.  Anesthesia: General  Complications: None  EBL: 25 mL  IVF:  1500 mL crystalloid  Specimens: 2. Right kidney  Disposition of specimens: Pathology  Drains: 1. # 15 Blake perinephric drain  Indication:  Malayja Freund is a 74 y.o. year old patient with a right renal mass with suggestion of renal vein thrombus.  After a thorough review of the management options for their renal mass, they elected to proceed with surgical treatment and the above procedure.  We have discussed the potential benefits and risks of the procedure, side effects of the proposed treatment, the likelihood of the patient achieving the goals of the procedure, and any potential problems that might occur during the procedure or recuperation. Informed consent has been obtained.   Description of procedure:  The patient was taken to the operating room and a general anesthetic was administered. The patient was given preoperative antibiotics, placed in the right modified flank position with care to pad all potential pressure points, and prepped and draped in the usual sterile fashion. Next a preoperative timeout was performed.  A site was selected on in the midline for placement of the assistant port. This was placed using a standard open Hassan technique which allowed entry into the peritoneal cavity under direct vision and without difficulty. A 12 mm port was placed and a  pneumoperitoneum established. The camera was then used to inspect the abdomen and there was no evidence of any intra-abdominal injuries or other abnormalities. The remaining abdominal ports were then placed. 8 mm robotic ports were placed in the right upper quadrant, right lower quadrant, and far right lateral abdominal wall. An 8 mm port was placed for the camera site just right of the umbilicus. A 5 mm port was placed in the upper midline to help retract the liver.  All ports were placed under direct vision without difficulty. The surgical cart was then docked.   Utilizing the cautery scissors, the white line of Toldt was incised allowing the colon to be mobilized medially and the plane between the mesocolon and the anterior layer of Gerota's fascia to be developed and the kidney to be exposed.  The ureter and gonadal vein were identified inferiorly and the ureter was lifted anteriorly off the psoas muscle.  Dissection proceeded superiorly along the gonadal vein until the renal vein was identified.  The gonadal vein was isolated and divided between 10 mm Weck clips. The renal hilum was then carefully isolated with a combination of blunt and sharp dissection allowing the renal arterial and venous structures to be separated and isolated.  There was a single renal artery posterior to the renal vein.  There was a single renal vein with another accessory vein just superior to the renal vein off the vena cava.  No thrombus was seen within the renal vein at its entry into the the vena cava.  The renal artery was isolated and was then ligated with multiple 10 mm Weck clips.  The renal vein was isolated with the superior vein above the renal vein separated  away from the renal vein.  The vein was then ligated with a 45 mm Flex Echelon stapler.  Gerota's fascia was intentionally entered superiorly and the space between the adrenal gland and the kidney was developed allowing the adrenal gland to be spared.  The  hepatorenal ligaments were divided with cautery and bipolar energy.  The lateral and posterior attachements to the kidney were then divided.  The large vein that came into the IVC superior to there renal vein was identified superior to the kidney and was divided between clips.  The ureter was ligated with Weck clips and divided allowing the specimen to be freed from all surrounding structures.  The kidney specimen was then placed into a 15 mm Endocatch II retrieval bag.  The renal hilum, liver, adrenal bed and gonadal vein areas were each inspected and hemostasis was ensured with the pneomperitoneal pressures lowered.  The 12 mm lower quadrant port was then closed with a 0-vicryl suture placed laparoscopically to close the fascia of this incision. All remaining ports were removed under direct vision.  The kidney specimen was removed intact within the retrieval bag via the camera port site after this incision was extended slightly. This fascial opening was then closed with two #1 PDS sutures.    All incisions were injected with local anesthetic and reapproximated at the skin with 4-0 monocryl sutures.  Dermabond was applied to the skin. The patient tolerated the procedure well and without complications and was transferred to the recovery unit in satisfactory condition.   Pryor Curia MD

## 2020-01-29 NOTE — Interval H&P Note (Signed)
History and Physical Interval Note:  01/29/2020 11:36 AM  Latasha Hodges  has presented today for surgery, with the diagnosis of RIGHT RENAL NEOPLASM.  The various methods of treatment have been discussed with the patient and family. After consideration of risks, benefits and other options for treatment, the patient has consented to  Procedure(s): XI ROBOTIC Ashley (N/A) POSSIBLE LYMPHADENECTOMY, RETROPERITONEAL (N/A) as a surgical intervention.  The patient's history has been reviewed, patient examined, no change in status, stable for surgery.  I have reviewed the patient's chart and labs.  Questions were answered to the patient's satisfaction.     [employer]

## 2020-01-29 NOTE — Anesthesia Postprocedure Evaluation (Signed)
Anesthesia Post Note  Patient: Latasha Hodges  Procedure(s) Performed: XI ROBOTIC ASSISTED LAPAROSCOPIC RADICAL NEPHRECTOMY (N/A )     Patient location during evaluation: PACU Anesthesia Type: General Level of consciousness: awake and alert Pain management: pain level controlled Vital Signs Assessment: post-procedure vital signs reviewed and stable Respiratory status: spontaneous breathing, nonlabored ventilation, respiratory function stable and patient connected to nasal cannula oxygen Cardiovascular status: blood pressure returned to baseline and stable Postop Assessment: no apparent nausea or vomiting Anesthetic complications: no   No complications documented.  Last Vitals:  Vitals:   01/29/20 1758 01/29/20 2011  BP: (!) 146/69 129/63  Pulse: 60 64  Resp: 16 14  Temp: (!) 36.3 C (!) 36.4 C  SpO2: 100% 95%    Last Pain:  Vitals:   01/29/20 2011  TempSrc: Oral  PainSc:                  [name]

## 2020-01-29 NOTE — Progress Notes (Signed)
Patient ID: Latasha Hodges, female   DOB: 20-Jan-1946, 74 y.o.   MRN: [id number]  Post-op note  Subjective: The patient is doing well.  Still very tired.  Objective: Vital signs in last 24 hours: Temp:  [97.7 F (36.5 C)-98.1 F (36.7 C)] 97.7 F (36.5 C) (12/16 1609) Pulse Rate:  [56-64] 60 (12/16 1715) Resp:  [8-64] 11 (12/16 1715) BP: (102-188)/(64-110) 102/91 (12/16 1715) SpO2:  [92 %-100 %] 100 % (12/16 1715) Weight:  [96.2 kg] 96.2 kg (12/16 1145)  Intake/Output from previous day: No intake/output data recorded. Intake/Output this shift: Total I/O In: 1500 [I.V.:1400; IV Piggyback:100] Out: 55 [Urine:30; Blood:25]  Physical Exam:  General: Alert and oriented. Abdomen: Soft, Nondistended. Incisions: Clean and dry.  Lab Results: Recent Labs    01/29/20 1631  HGB 11.8*  HCT 37.7    Assessment/Plan: POD#0   1) Continue to monitor, ambulate, IS   Pryor Curia. MD   LOS: 0 days   [name] 01/29/2020, 5:29 PM

## 2020-01-29 NOTE — Discharge Instructions (Signed)
1.  Activity:  You are encouraged to ambulate frequently (about every hour during waking hours) to help prevent blood clots from forming in your legs or lungs.  However, you should not engage in any heavy lifting (> 10-15 lbs), strenuous activity, or straining. 2. Diet: You should advance your diet as instructed by your physician.  It will be normal to have some bloating, nausea, and abdominal discomfort intermittently. 3. Prescriptions:  You will be provided a prescription for pain medication to take as needed.  If your pain is not severe enough to require the prescription pain medication, you may take extra strength Tylenol instead which will have less side effects.  You should also take a prescribed stool softener to avoid straining with bowel movements as the prescription pain medication may constipate you. 4. Incisions: You may remove your dressing bandages 48 hours after surgery if not removed in the hospital.  You will either have some small staples or special tissue glue at each of the incision sites. Once the bandages are removed (if present), the incisions may stay open to air.  You may start showering (but not soaking or bathing in water) the 2nd day after surgery and the incisions simply need to be patted dry after the shower.  No additional care is needed. 5. What to call us about: You should call the office [phone]) if you develop fever > 101 or develop persistent vomiting.   You may resume aspirin, advil, aleve, vitamins, and supplements 7 days after surgery. Resume Coumadin per Dr. Lynne [name] instructions.

## 2020-01-29 NOTE — Transfer of Care (Signed)
Immediate Anesthesia Transfer of Care Note  Patient: [name]  Procedure(s) Performed: XI ROBOTIC ASSISTED LAPAROSCOPIC RADICAL NEPHRECTOMY (N/A )  Patient Location: PACU  Anesthesia Type:General  Level of Consciousness: awake, alert , oriented and patient cooperative  Airway & Oxygen Therapy: Patient Spontanous Breathing and Patient connected to face mask oxygen  Post-op Assessment: Report given to RN, Post -op Vital signs reviewed and stable and Patient moving all extremities  Post vital signs: Reviewed and stable  Last Vitals:  Vitals Value Taken Time  BP 202/96 01/29/20 1615  Temp    Pulse 60 01/29/20 1619  Resp 8 01/29/20 1619  SpO2 100 % 01/29/20 1619  Vitals shown include unvalidated device data.  Last Pain:  Vitals:   01/29/20 1145  TempSrc:   PainSc: 0-No pain         Complications: No complications documented.

## 2020-01-29 NOTE — Anesthesia Procedure Notes (Signed)
Procedure Name: Intubation Date/Time: 01/29/2020 12:49 PM Performed by: Mitzie Na, CRNA Pre-anesthesia Checklist: Patient identified, Emergency Drugs available, Suction available and Patient being monitored Patient Re-evaluated:Patient Re-evaluated prior to induction Oxygen Delivery Method: Circle system utilized Preoxygenation: Pre-oxygenation with 100% oxygen Induction Type: IV induction Ventilation: Mask ventilation without difficulty and Oral airway inserted - appropriate to patient size Laryngoscope Size: Mac and 3 Grade View: Grade I Tube type: Oral Tube size: 7.0 mm Number of attempts: 1 Airway Equipment and Method: Stylet and Oral airway Placement Confirmation: ETT inserted through vocal cords under direct vision,  positive ETCO2 and breath sounds checked- equal and bilateral Secured at: 23 cm Tube secured with: Tape Dental Injury: Teeth and Oropharynx as per pre-operative assessment

## 2020-01-30 ENCOUNTER — Encounter (HOSPITAL_COMMUNITY): Payer: Self-pay | Admitting: Urology

## 2020-01-30 ENCOUNTER — Other Ambulatory Visit (HOSPITAL_COMMUNITY): Payer: Self-pay | Admitting: Urology

## 2020-01-30 LAB — BASIC METABOLIC PANEL
Anion gap: 15 (ref 5–15)
BUN: 28 mg/dL — ABNORMAL HIGH (ref 8–23)
CO2: 18 mmol/L — ABNORMAL LOW (ref 22–32)
Calcium: 8.6 mg/dL — ABNORMAL LOW (ref 8.9–10.3)
Chloride: 98 mmol/L (ref 98–111)
Creatinine, Ser: 1.37 mg/dL — ABNORMAL HIGH (ref 0.44–1.00)
GFR, Estimated: 41 mL/min — ABNORMAL LOW (ref >60)
Glucose, Bld: 206 mg/dL — ABNORMAL HIGH (ref 70–99)
Potassium: 4.8 mmol/L (ref 3.5–5.1)
Sodium: 131 mmol/L — ABNORMAL LOW (ref 135–145)

## 2020-01-30 LAB — GLUCOSE, CAPILLARY
Glucose-Capillary: 109 mg/dL — ABNORMAL HIGH (ref 70–99)
Glucose-Capillary: 150 mg/dL — ABNORMAL HIGH (ref 70–99)
Glucose-Capillary: 150 mg/dL — ABNORMAL HIGH (ref 70–99)
Glucose-Capillary: 172 mg/dL — ABNORMAL HIGH (ref 70–99)
Glucose-Capillary: 191 mg/dL — ABNORMAL HIGH (ref 70–99)
Glucose-Capillary: 221 mg/dL — ABNORMAL HIGH (ref 70–99)

## 2020-01-30 LAB — HEMOGLOBIN AND HEMATOCRIT, BLOOD
HCT: 31.7 % — ABNORMAL LOW (ref 36.0–46.0)
Hemoglobin: 9.9 g/dL — ABNORMAL LOW (ref 12.0–15.0)

## 2020-01-30 MED ORDER — CHLORHEXIDINE GLUCONATE CLOTH 2 % EX PADS: 6.0000 | MEDICATED_PAD | Freq: Every day | CUTANEOUS | Status: DC

## 2020-01-30 MED ORDER — HYDROCODONE-ACETAMINOPHEN 5-325 MG PO TABS: 1.0000 | ORAL_TABLET | Freq: Four times a day (QID) | ORAL | 0 refills | Status: DC | PRN

## 2020-01-30 MED ORDER — TRAMADOL HCL 50 MG PO TABS
50.0000 mg | ORAL_TABLET | Freq: Four times a day (QID) | ORAL | Status: DC | PRN
  Administered 2020-01-30: 50 mg via ORAL
  Filled 2020-01-30: qty 2
  Filled 2020-01-30: qty 1

## 2020-01-30 MED ORDER — BISACODYL 10 MG RE SUPP
10.0000 mg | Freq: Once | RECTAL | Status: AC
  Administered 2020-01-30: 12:00:00 10 mg via RECTAL
  Filled 2020-01-30: qty 1

## 2020-01-30 MED ORDER — HYDROCODONE-ACETAMINOPHEN 5-325 MG PO TABS
1.0000 | ORAL_TABLET | Freq: Four times a day (QID) | ORAL | Status: DC | PRN
  Administered 2020-01-30 – 2020-01-31 (×4): 2 via ORAL
  Administered 2020-01-31: 1 via ORAL
  Administered 2020-02-01: 2 via ORAL
  Filled 2020-01-30 (×3): qty 2
  Filled 2020-01-30: qty 1
  Filled 2020-01-30 (×2): qty 2

## 2020-01-30 NOTE — Progress Notes (Signed)
Patient ID: Latasha Hodges, female   DOB: 1945-06-11, 74 y.o.   MRN: [id number]  1 Day Post-Op Subjective: Pt doing well.  Mild nausea.  No flatus yet.  Pain controlled.  Ambulated well last night.  Objective: Vital signs in last 24 hours: Temp:  [97.4 F (36.3 C)-98.4 F (36.9 C)] 98.4 F (36.9 C) (12/16 2354) Pulse Rate:  [56-64] 64 (12/17 0021) Resp:  [8-64] 14 (12/16 2011) BP: (102-188)/(55-110) 134/55 (12/17 0021) SpO2:  [92 %-100 %] 96 % (12/16 2354) Weight:  [96.2 kg] 96.2 kg (12/16 1145)  Intake/Output from previous day: 12/16 0701 - 12/17 0700 In: 2411.3 [I.V.:2161.3; IV Piggyback:250] Out: 55 [Urine:30; Blood:25] Intake/Output this shift: No intake/output data recorded.  Physical Exam:  General: Alert and oriented Abdomen: Soft, ND Incisions: C/D/I Ext: NT, No erythema  Lab Results: Recent Labs    01/29/20 1631 01/30/20 0543  HGB 11.8* 9.9*  HCT 37.7 31.7*   BMET Recent Labs    01/29/20 1631 01/30/20 0543  NA 135 131*  K 4.0 4.8  CL 101 98  CO2 22 18*  GLUCOSE 274* 206*  BUN 22 28*  CREATININE 1.05* 1.37*  CALCIUM 9.1 8.6*     Studies/Results: No results found.  Assessment/Plan: 1) POD # 1 s/p right RAL radical nephrectomy - Ambulate, IS, DVT prophylaxis - Advance diet - D/C Foley - Oral pain medication - Path pending - Follow renal function, Hgb - Hold anticoagulation today with plans to restart tomorrow if Hgb stable - SSI   LOS: 1 day   Latasha Hodges 01/30/2020, 8:05 AM

## 2020-01-30 NOTE — Progress Notes (Signed)
Patient ID: Latasha Hodges, female   DOB: Sep 22, 1945, 74 y.o.   MRN: [id number]  Pt progressing.  Pain not well controlled on tramadol and will switch to hydrocodone.  Tolerating regular diet.  Some flatus.  Ambulating well but difficulty with transfer.  PT working with her.  - If Hgb stable in AM, may restart Coumadin Saturday night with plans to have INR checked early next week. - Pending PT recommendations, d/c home tomorrow or Sunday.  May need HHPT but awaiting final recs from PT.

## 2020-01-30 NOTE — Evaluation (Signed)
Physical Therapy Evaluation Patient Details Name: Latasha Hodges MRN: [id number] DOB: March 05, 1945 Today's Date: 01/30/2020   History of Present Illness  74 year old female who was undergoing an MRI of the lumbar spine for back pain by Dr. Sherley Bounds. She was incidentally noted to have a right renal mass prompting a dedicated MRI of the abdomen with and without IV contrast for evaluation. This demonstrated a 6.9 cm exophytic mass of the posterior upper pole right kidney with enhancement with a filling defect extending into the renal vein but not the IVC. Additional findings suggest prominent collateral vessels to the upper pole of the right kidney, a probable 2nd 1.6 cm enhancing tumor in the lower pole of the right kidney. There is a 9 mm non-pathologically enlarged lymph node in the perihilar region. pt is s/p laparoscopic R nephrectomy. Past medical history is significant for hypertension, atrial fibrillation (on Coumadin), hyperlipidemia, diabetes, and asthma.  Clinical Impression  Pt admitted with above diagnosis.  Pt reports difficulty with transfers and much difficulty with bed mobility; we verbally reviewed benefit of log rolling. Discussed sleeping in recliner at home initially. She is amb well.  Pt may need another day or so to incr independence prior to return home. Encouraged pt to mobilize as much as possible with staff or her dtr and work on sit<>stand transitions.   May need HHPT depending on progress as well as DME noted below.   Pt currently with functional limitations due to the deficits listed below (see PT Problem List). Pt will benefit from skilled PT to increase their independence and safety with mobility to allow discharge to the venue listed below.       Follow Up Recommendations Home health PT;No PT follow up (depending on progress)    Equipment Recommendations  Rolling walker with 5" wheels;3in1 (PT)    Recommendations for Other Services       Precautions /  Restrictions Precautions Precautions: None      Mobility  Bed Mobility               General bed mobility comments: pt was up in recliner, discussed log roll technique which pt is familiar.    Transfers Overall transfer level: Needs assistance Equipment used: Rolling walker (2 wheeled) Transfers: Sit to/from Stand Sit to Stand: Min guard;Min assist         General transfer comment: min from lower surface, min/guard from higher surfaces, cues to power up with LEs  Ambulation/Gait Ambulation/Gait assistance: Min guard;Supervision Gait Distance (Feet): 120 Feet Assistive device: Rolling walker (2 wheeled) Gait Pattern/deviations: Step-through pattern;Wide base of support     General Gait Details: supervision for safety  Stairs            Wheelchair Mobility    Modified Rankin (Stroke Patients Only)       Balance Overall balance assessment: Needs assistance Sitting-balance support: Feet supported;No upper extremity supported Sitting balance-Leahy Scale: Fair       Standing balance-Leahy Scale: Fair                               Pertinent Vitals/Pain Pain Assessment: 0-10 Pain Score: 7  Pain Location: abd Pain Descriptors / Indicators: Grimacing;Sore Pain Intervention(s): Limited activity within patient's tolerance;Monitored during session;Repositioned    Home Living Family/patient expects to be discharged to:: Private residence Living Arrangements: Children (dtr who is a Marine scientist) Available Help at Discharge: Family;Available PRN/intermittently Type of Home: House  Home Access: Level entry     Home Layout: One level Home Equipment: Walker - 4 wheels      Prior Function Level of Independence: Independent;Independent with assistive device(s)         Comments: amb wth rollator d/t back pain     Hand Dominance        Extremity/Trunk Assessment   Upper Extremity Assessment Upper Extremity Assessment: Overall WFL for tasks  assessed    Lower Extremity Assessment Lower Extremity Assessment: Overall WFL for tasks assessed       Communication   Communication: No difficulties  Cognition Arousal/Alertness: Awake/alert Behavior During Therapy: WFL for tasks assessed/performed Overall Cognitive Status: Within Functional Limits for tasks assessed                                        General Comments      Exercises     Assessment/Plan    PT Assessment Patient needs continued PT services  PT Problem List Decreased mobility;Decreased activity tolerance;Decreased balance;Pain       PT Treatment Interventions DME instruction;Therapeutic activities;Gait training;Functional mobility training;Therapeutic exercise;Patient/family education    PT Goals (Current goals can be found in the Care Plan section)  Acute Rehab PT Goals Patient Stated Goal: home when more independent PT Goal Formulation: With patient Time For Goal Achievement: 02/13/20 Potential to Achieve Goals: Good    Frequency Min 3X/week   Barriers to discharge        Co-evaluation               AM-PAC PT "6 Clicks" Mobility  Outcome Measure Help needed turning from your back to your side while in a flat bed without using bedrails?: A Lot Help needed moving from lying on your back to sitting on the side of a flat bed without using bedrails?: A Lot Help needed moving to and from a bed to a chair (including a wheelchair)?: A Little Help needed standing up from a chair using your arms (e.g., wheelchair or bedside chair)?: A Little Help needed to walk in hospital room?: A Little Help needed climbing 3-5 steps with a railing? : A Lot 6 Click Score: 15    End of Session   Activity Tolerance: Patient tolerated treatment well Patient left: in chair;with call bell/phone within reach   PT Visit Diagnosis: Other abnormalities of gait and mobility (R26.89)    Time: 4259-5638 PT Time Calculation (min) (ACUTE ONLY): 28  min   Charges:   PT Evaluation $PT Eval Low Complexity: 1 Low PT Treatments $Gait Training: 8-22 mins        Baxter Flattery, PT  Acute Rehab Dept ([place]) [phone] Pager [phone]  01/30/2020   [facility] 01/30/2020, 5:19 PM

## 2020-01-30 NOTE — Discharge Summary (Signed)
Date of admission: 01/29/2020  Date of discharge: 01/31/2020  Admission diagnosis: Right renal tumor  Discharge diagnosis: Right renal tumor  Secondary diagnoses: Diabetes, atrial fibrillation, hypertension  History and Physical: For full details, please see admission history and physical. Briefly, Latasha Hodges is a 74 y.o. 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   Hospital Course: She underwent right RAL radical nephrectomy on 01/29/20.  She tolerated this well and without complications.  She was able to have her diet advanced and begin ambulating on POD # 1.  Her pain was controlled with oral pain medication.  By POD # 3, she was doing well.  PT evaluated her and felt she could be discharged home with recommendations.  Her renal function stabilized and her Hgb stabilized.  Her pathology remains pending.  Laboratory values: Recent Labs    01/29/20 1631 01/30/20 0543  HGB 11.8* 9.9*  HCT 37.7 31.7*   Recent Labs    01/29/20 1631 01/30/20 0543  CREATININE 1.05* 1.37*    Disposition: Home  Discharge instruction: The patient was instructed to be ambulatory but told to refrain from heavy lifting, strenuous activity, or driving.  Discharge medications:  Allergies as of 01/30/2020      Reactions   Iodinated Diagnostic Agents Hives, Swelling, Rash   No Healthtouch Food Allergies    Seafood- rash   Penicillins Rash   Childhood reaction.   Sulfa Drugs Cross Reactors Rash   Childhood reaction.   Theophyllines Rash      Medication List    TAKE these medications   fenofibrate micronized 200 MG capsule Commonly known as: LOFIBRA TAKE 1 CAPSULE BY MOUTH DAILY BEFORE BREAKFAST What changed:   how much to take  how to take this  when to take this  additional instructions   flecainide 50 MG tablet Commonly known as: TAMBOCOR TAKE 1 TABLET(50 MG) BY MOUTH TWICE DAILY What changed:   how much to take  how to take this  when  to take this  additional instructions   HYDROcodone-acetaminophen 5-325 MG tablet Commonly known as: NORCO/VICODIN Take 1-2 tablets by mouth every 6 (six) hours as needed for moderate pain or severe pain.   lisinopril 20 MG tablet Commonly known as: ZESTRIL TAKE 1 TABLET(20 MG) BY MOUTH DAILY What changed:   how much to take  how to take this  when to take this  additional instructions   magnesium oxide 400 MG tablet Commonly known as: MAG-OX Take 800 mg by mouth at bedtime.   metFORMIN 1000 MG tablet Commonly known as: GLUCOPHAGE Take 1,000 mg by mouth 2 (two) times daily.   metoprolol succinate 50 MG 24 hr tablet Commonly known as: TOPROL-XL Take 1 tablet (50 mg total) by mouth daily. What changed: when to take this   simvastatin 40 MG tablet Commonly known as: ZOCOR TAKE 1 TABLET(40 MG) BY MOUTH DAILY AT 6 PM. What changed:   how much to take  how to take this  when to take this  additional instructions   Trulicity 1.5 SA/6.3KZ Sopn Generic drug: Dulaglutide Inject 1.5 mg into the skin every Sunday.   warfarin 5 MG tablet Commonly known as: COUMADIN Take as directed. If you are unsure how to take this medication, talk to your nurse or doctor. Original instructions: TAKE BY MOUTH AS DIRECTED EVERY DAY       Followup:   Follow-up Information    Raynelle Bring, MD On 02/24/2020.   Specialty: Urology  Why: at 9:00 Contact information: [place]  [id number] [phone]

## 2020-01-31 LAB — BASIC METABOLIC PANEL WITH GFR
Anion gap: 11 (ref 5–15)
BUN: 31 mg/dL — ABNORMAL HIGH (ref 8–23)
CO2: 22 mmol/L (ref 22–32)
Calcium: 9.1 mg/dL (ref 8.9–10.3)
Chloride: 102 mmol/L (ref 98–111)
Creatinine, Ser: 1.36 mg/dL — ABNORMAL HIGH (ref 0.44–1.00)
GFR, Estimated: 41 mL/min — ABNORMAL LOW
Glucose, Bld: 158 mg/dL — ABNORMAL HIGH (ref 70–99)
Potassium: 3.9 mmol/L (ref 3.5–5.1)
Sodium: 135 mmol/L (ref 135–145)

## 2020-01-31 LAB — GLUCOSE, CAPILLARY
Glucose-Capillary: 149 mg/dL — ABNORMAL HIGH (ref 70–99)
Glucose-Capillary: 156 mg/dL — ABNORMAL HIGH (ref 70–99)
Glucose-Capillary: 159 mg/dL — ABNORMAL HIGH (ref 70–99)
Glucose-Capillary: 161 mg/dL — ABNORMAL HIGH (ref 70–99)
Glucose-Capillary: 170 mg/dL — ABNORMAL HIGH (ref 70–99)
Glucose-Capillary: 217 mg/dL — ABNORMAL HIGH (ref 70–99)

## 2020-01-31 LAB — HEMOGLOBIN AND HEMATOCRIT, BLOOD
HCT: 30.7 % — ABNORMAL LOW (ref 36.0–46.0)
Hemoglobin: 9.7 g/dL — ABNORMAL LOW (ref 12.0–15.0)

## 2020-01-31 LAB — PROTIME-INR
INR: 1.3 — ABNORMAL HIGH (ref 0.8–1.2)
Prothrombin Time: 15.5 s — ABNORMAL HIGH (ref 11.4–15.2)

## 2020-01-31 MED ORDER — WARFARIN SODIUM 5 MG PO TABS
5.0000 mg | ORAL_TABLET | Freq: Once | ORAL | Status: AC
  Administered 2020-01-31: 15:00:00 5 mg via ORAL
  Filled 2020-01-31: qty 1

## 2020-01-31 MED ORDER — WARFARIN SODIUM 5 MG PO TABS: 5.0000 mg | ORAL_TABLET | Freq: Every day | ORAL | Status: DC

## 2020-01-31 MED ORDER — WARFARIN - PHARMACIST DOSING INPATIENT: Freq: Every day | Status: DC

## 2020-01-31 MED ORDER — WARFARIN - PHYSICIAN DOSING INPATIENT: Freq: Every day | Status: DC

## 2020-01-31 MED FILL — HYDROCODON-APAP 5-325: 5-325 | 3 days supply | Qty: 20 | Fill #0

## 2020-01-31 NOTE — Progress Notes (Signed)
2 Days Post-Op Subjective: Patient reports flatus but no BM yet. No nausea. Walking lots but still hard to get out of chair.  Objective: Vital signs in last 24 hours: Temp:  [97.8 F (36.6 C)-98.7 F (37.1 C)] 98.5 F (36.9 C) (12/18 0425) Pulse Rate:  [59-69] 60 (12/18 0425) Resp:  [18] 18 (12/18 0425) BP: (113-132)/(41-57) 132/47 (12/18 0425) SpO2:  [95 %-99 %] 95 % (12/18 0425)  Intake/Output from previous day: 12/17 0701 - 12/18 0700 In: 1218 [P.O.:640; I.V.:578] Out: 1100 [Urine:1100] Intake/Output this shift: Total I/O In: 300 [P.O.:300] Out: 300 [Urine:300]  Physical Exam:  Constitutional: Vital signs reviewed. WD WN in NAD   Eyes: PERRL, No scleral icterus.   Cardiovascular: RRR Pulmonary/Chest: Normal effort Abdominal: Soft.  Incisions C/D/I, no tympany. Extremities: No cyanosis or edema   Lab Results: Recent Labs    01/29/20 1631 01/30/20 0543 01/31/20 0549  HGB 11.8* 9.9* 9.7*  HCT 37.7 31.7* 30.7*   BMET Recent Labs    01/30/20 0543 01/31/20 0549  NA 131* 135  K 4.8 3.9  CL 98 102  CO2 18* 22  GLUCOSE 206* 158*  BUN 28* 31*  CREATININE 1.37* 1.36*  CALCIUM 8.6* 9.1   No results for input(s): LABPT, INR in the last 72 hours. No results for input(s): LABURIN in the last 72 hours. Results for orders placed or performed during the hospital encounter of 01/26/20  SARS CORONAVIRUS 2 (TAT 6-24 HRS) Nasopharyngeal Nasopharyngeal Swab     Status: None   Collection Time: 01/26/20 10:24 AM   Specimen: Nasopharyngeal Swab  Result Value Ref Range Status   SARS Coronavirus 2 NEGATIVE NEGATIVE Final    Comment: (NOTE) SARS-CoV-2 target nucleic acids are NOT DETECTED.  The SARS-CoV-2 RNA is generally detectable in upper and lower respiratory specimens during the acute phase of infection. Negative results do not preclude SARS-CoV-2 infection, do not rule out co-infections with other pathogens, and should not be used as the sole basis for treatment or  other patient management decisions. Negative results must be combined with clinical observations, patient history, and epidemiological information. The expected result is Negative.  Fact Sheet for Patients: SugarRoll.be  Fact Sheet for Healthcare Providers: https://www.woods-mathews.com/  This test is not yet approved or cleared by the [place] FDA and  has been authorized for detection and/or diagnosis of SARS-CoV-2 by FDA under an Emergency Use Authorization (EUA). This EUA will remain  in effect (meaning this test can be used) for the duration of the COVID-19 declaration under Se ction 564(b)(1) of the Act, 21 U.S.C. section 360bbb-3(b)(1), unless the authorization is terminated or revoked sooner.  Performed at [facility], [place] [address], [place],  [address]     Studies/Results: No results found.  Assessment/Plan:   POD 2 RARtNx, doing well. Still tough to get upp from chair--PT working w/ that.  As Hct stable, will  r/s coumadin today   LOS: 2 days   Jorja Loa 01/31/2020, 8:17 AM

## 2020-01-31 NOTE — Plan of Care (Signed)
  Problem: Clinical Measurements: Goal: Postoperative complications will be avoided or minimized Outcome: Progressing   Problem: Respiratory: Goal: Ability to achieve and maintain a regular respiratory rate will improve Outcome: Progressing   Problem: Skin Integrity: Goal: Demonstration of wound healing without infection will improve Outcome: Progressing   Problem: Urinary Elimination: Goal: Ability to avoid or minimize complications of infection will improve Outcome: Progressing

## 2020-01-31 NOTE — Progress Notes (Signed)
[place] for warfarin Indication: hx atrial fibrillation  Allergies  Allergen Reactions  . Iodinated Diagnostic Agents Hives, Swelling and Rash  . No Healthtouch Food Allergies     Seafood- rash   . Penicillins Rash    Childhood reaction.  . Sulfa Drugs Cross Reactors Rash    Childhood reaction.  . Theophyllines Rash    Patient Measurements: Height: 5\' 3"  (160 cm) Weight: 96.2 kg (212 lb 1.3 oz) IBW/kg (Calculated) : 52.4 Heparin Dosing Weight:   Vital Signs: Temp: 98.5 F (36.9 C) (12/18 0425) Temp Source: Oral (12/18 0425) BP: 132/47 (12/18 0425) Pulse Rate: 60 (12/18 0425)  Labs: Recent Labs    01/29/20 1631 01/30/20 0543 01/31/20 0549  HGB 11.8* 9.9* 9.7*  HCT 37.7 31.7* 30.7*  CREATININE 1.05* 1.37* 1.36*    Estimated Creatinine Clearance: 41.3 mL/min (A) (by C-G formula based on SCr of 1.36 mg/dL (H)).   Medications:  PTA warfarin regimen: 2.5mg  daily except 5mg  on MF (last dose taken 12/9)  Assessment: Patient is a 74 y.o F with hx afib on warfarin PTA s/p right nephrectomy on 12/16 for right renal cancer with renal vein thrombus. Pharmacy has been consulted to resume warfarin back on 12/18.  Today, 01/31/2020: - INR 1.3 - hgb down 9.7 - no bleeding documented - currently on lovenox 40 mg daily for VTE prophylaxis - no signif. drug-drug intxns   Goal of Therapy:  INR 2-3 Monitor platelets by anticoagulation protocol: Yes   Plan:  - warfarin 5 mg PO x1 - daily INR - monitor for s/s bleeding  Latasha Hodges P 01/31/2020,9:42 AM

## 2020-02-01 LAB — PROTIME-INR
INR: 1.3 — ABNORMAL HIGH (ref 0.8–1.2)
Prothrombin Time: 15.5 seconds — ABNORMAL HIGH (ref 11.4–15.2)

## 2020-02-01 LAB — GLUCOSE, CAPILLARY
Glucose-Capillary: 131 mg/dL — ABNORMAL HIGH (ref 70–99)
Glucose-Capillary: 130 mg/dL — ABNORMAL HIGH (ref 70–99)

## 2020-02-01 MED ORDER — WARFARIN SODIUM 5 MG PO TABS: 5.0000 mg | ORAL_TABLET | Freq: Once | ORAL | Status: DC

## 2020-02-01 NOTE — Plan of Care (Signed)
  Problem: Education: Goal: Knowledge of the prescribed therapeutic regimen will improve Outcome: Progressing   Problem: Clinical Measurements: Goal: Postoperative complications will be avoided or minimized Outcome: Progressing   Problem: Skin Integrity: Goal: Demonstration of wound healing without infection will improve Outcome: Progressing   Problem: Clinical Measurements: Goal: Respiratory complications will improve Outcome: Progressing   Problem: Activity: Goal: Risk for activity intolerance will decrease Outcome: Progressing

## 2020-02-01 NOTE — TOC Initial Note (Signed)
Transition of Care [facility]) - Initial/Assessment Note    Patient Details  Name: Latasha Hodges MRN: [id number] Date of Birth: 11/19/1945  [facility] CM/SW Contact:    Joaquin Courts, RN Phone Number: 02/01/2020, 11:24 AM  Clinical Narrative:                 [employer] to deliver rolling walker and 3in1 to bedside for home use.  CM spoke with bedside RN Abby who reports patient has been ambulating independently with device, dressing self, and has progressed in adl's, does not need any HH services at this time.  Expected Discharge Plan: Home/Self Care Barriers to Discharge: No Barriers Identified   Patient Goals and CMS Choice Patient states their goals for this hospitalization and ongoing recovery are:: to go home      Expected Discharge Plan and Services Expected Discharge Plan: Home/Self Care   Discharge Planning Services: CM Consult   Living arrangements for the past 2 months: Single Family Home Expected Discharge Date: 02/01/20               DME Arranged: Gilford Rile rolling,3-N-1 DME Agency: [employer] Date DME Agency Contacted: 02/01/20 Time DME Agency Contacted: 1122 Representative spoke with at DME Agency: [place]            Prior Living Arrangements/Services Living arrangements for the past 2 months: [place]   Patient language and need for interpreter reviewed:: Yes Do you feel safe going back to the place where you live?: Yes      Need for Family Participation in Patient Care: Yes (Comment) Care giver support system in place?: Yes (comment)   Criminal Activity/Legal Involvement Pertinent to Current Situation/Hospitalization: No - Comment as needed  Activities of Daily Living Home Assistive Devices/Equipment: Walker (specify type),CBG Meter,Eyeglasses ADL Screening (condition at time of admission) Patient's cognitive ability adequate to safely complete daily activities?: Yes Is the patient deaf or have difficulty hearing?:  No Does the patient have difficulty seeing, even when wearing glasses/contacts?: No Does the patient have difficulty concentrating, remembering, or making decisions?: No Patient able to express need for assistance with ADLs?: Yes Does the patient have difficulty dressing or bathing?: No Independently performs ADLs?: Yes (appropriate for developmental age) Does the patient have difficulty walking or climbing stairs?: Yes Weakness of Legs: None Weakness of Arms/Hands: None  Permission Sought/Granted                  Emotional Assessment Appearance:: Appears stated age Attitude/Demeanor/Rapport: Engaged Affect (typically observed): Accepting Orientation: : Oriented to Self,Oriented to Place,Oriented to  Time,Oriented to Situation   Psych Involvement: No (comment)  Admission diagnosis:  Neoplasm of right kidney [D49.511] Patient Active Problem List   Diagnosis Date Noted  . Neoplasm of right kidney 01/29/2020  . RBBB (right bundle branch block) 11/05/2017  . Hyperlipidemia 08/09/2017  . Encounter for therapeutic drug monitoring 11/20/2014  . Hypertension   . Persistent atrial fibrillation ([place])   . Diabetes mellitus 10/04/2010  . Obesity 10/04/2010   PCP:  Kelton Pillar, MD Pharmacy:   [facility] [phone] [address], [address] AT [facility] [address] [place] [address] Phone: [phone] Fax: [phone]     Social Determinants of Health ([place]) Interventions    Readmission Risk Interventions No flowsheet data found.

## 2020-02-01 NOTE — Progress Notes (Signed)
[place] for warfarin Indication: hx atrial fibrillation  Allergies  Allergen Reactions  . Iodinated Diagnostic Agents Hives, Swelling and Rash  . No Healthtouch Food Allergies     Seafood- rash   . Penicillins Rash    Childhood reaction.  . Sulfa Drugs Cross Reactors Rash    Childhood reaction.  . Theophyllines Rash    Patient Measurements: Height: 5\' 3"  (160 cm) Weight: 96.2 kg (212 lb 1.3 oz) IBW/kg (Calculated) : 52.4 Heparin Dosing Weight:   Vital Signs: Temp: 98.4 F (36.9 C) (12/19 0544) BP: 135/57 (12/19 0544) Pulse Rate: 54 (12/19 0544)  Labs: Recent Labs    01/29/20 1631 01/30/20 0543 01/31/20 0549 01/31/20 1031 02/01/20 0550  HGB 11.8* 9.9* 9.7*  --   --   HCT 37.7 31.7* 30.7*  --   --   LABPROT  --   --   --  15.5* 15.5*  INR  --   --   --  1.3* 1.3*  CREATININE 1.05* 1.37* 1.36*  --   --     Estimated Creatinine Clearance: 41.3 mL/min (A) (by C-G formula based on SCr of 1.36 mg/dL (H)).   Medications:  PTA warfarin regimen: 2.5mg  daily except 5mg  on MF (last dose taken 12/9)  Assessment: Patient is a 74 y.o F with hx afib on warfarin PTA s/p right nephrectomy on 12/16 for right renal cancer with renal vein thrombus. Warfarin resumed back on 12/18.  Today, 02/01/2020: - INR remains sub-therapeutic at 1.3 with 5mg  dose given yesterday - hgb 9.7 on 12/18 - no bleeding documented - currently on lovenox 40 mg daily for VTE prophylaxis - no signif. drug-drug intxns   Goal of Therapy:  INR 2-3 Monitor platelets by anticoagulation protocol: Yes   Plan:  -repeat warfarin 5 mg PO x1 - daily INR - monitor for s/s bleeding  Nikesh Teschner P 02/01/2020,8:58 AM

## 2020-02-01 NOTE — Progress Notes (Signed)
Pt ambulating with rolling walker in hallway with standby assistance. Tolerated well. Patient and patients family did not feel that home health PT was needed at this time.

## 2020-02-02 LAB — SURGICAL PATHOLOGY

## 2020-02-11 ENCOUNTER — Ambulatory Visit (INDEPENDENT_AMBULATORY_CARE_PROVIDER_SITE_OTHER): Payer: Medicare Other

## 2020-02-11 ENCOUNTER — Other Ambulatory Visit: Payer: Self-pay

## 2020-02-11 DIAGNOSIS — I4819 Other persistent atrial fibrillation: Secondary | ICD-10-CM

## 2020-02-11 DIAGNOSIS — Z5181 Encounter for therapeutic drug level monitoring: Secondary | ICD-10-CM | POA: Diagnosis not present

## 2020-02-11 DIAGNOSIS — I480 Paroxysmal atrial fibrillation: Secondary | ICD-10-CM

## 2020-02-11 LAB — POCT INR: INR: 1.9 — AB (ref 2.0–3.0)

## 2020-02-11 NOTE — Patient Instructions (Signed)
Description   Take 1 tablet today then resume same dosage 1/2 tablet every day except 1 tablet on Mondays and Fridays. Recheck 4 weeks.  Call our office if you have any questions or if placed on any new medications [phone].

## 2020-02-24 DIAGNOSIS — C641 Malignant neoplasm of right kidney, except renal pelvis: Secondary | ICD-10-CM | POA: Diagnosis not present

## 2020-03-09 DIAGNOSIS — M4316 Spondylolisthesis, lumbar region: Secondary | ICD-10-CM | POA: Diagnosis not present

## 2020-03-09 DIAGNOSIS — M48062 Spinal stenosis, lumbar region with neurogenic claudication: Secondary | ICD-10-CM | POA: Diagnosis not present

## 2020-03-10 ENCOUNTER — Other Ambulatory Visit: Payer: Self-pay

## 2020-03-10 ENCOUNTER — Telehealth: Payer: Self-pay | Admitting: Oncology

## 2020-03-10 ENCOUNTER — Ambulatory Visit (INDEPENDENT_AMBULATORY_CARE_PROVIDER_SITE_OTHER): Payer: Medicare Other | Admitting: *Deleted

## 2020-03-10 DIAGNOSIS — I480 Paroxysmal atrial fibrillation: Secondary | ICD-10-CM | POA: Diagnosis not present

## 2020-03-10 DIAGNOSIS — Z5181 Encounter for therapeutic drug level monitoring: Secondary | ICD-10-CM | POA: Diagnosis not present

## 2020-03-10 LAB — POCT INR: INR: 2 (ref 2.0–3.0)

## 2020-03-10 NOTE — Patient Instructions (Addendum)
Description   Take 1 tablet today then start taking 1/2 tablet every day except 1 tablet on Mondays, Wednesdays, and Fridays. Recheck 4 weeks.  Call our office if you have any questions or if placed on any new medications [phone].

## 2020-03-10 NOTE — Telephone Encounter (Signed)
Received a new hem referral from Dr. Alinda Money for renal cell carcinoma. Ms. Latasha Hodges has been cld and scheduled to see Dr. Alen Blew on 2/2 at 11am. Pt aware to arrive 20 minutes early.

## 2020-03-17 ENCOUNTER — Inpatient Hospital Stay: Payer: Medicare Other | Attending: Oncology | Admitting: Oncology

## 2020-03-17 ENCOUNTER — Other Ambulatory Visit: Payer: Self-pay

## 2020-03-17 VITALS — BP 133/58 | HR 63 | Temp 97.0°F | Resp 18 | Ht 63.0 in | Wt 203.5 lb

## 2020-03-17 DIAGNOSIS — E669 Obesity, unspecified: Secondary | ICD-10-CM | POA: Insufficient documentation

## 2020-03-17 DIAGNOSIS — G8929 Other chronic pain: Secondary | ICD-10-CM | POA: Diagnosis not present

## 2020-03-17 DIAGNOSIS — Z905 Acquired absence of kidney: Secondary | ICD-10-CM | POA: Diagnosis not present

## 2020-03-17 DIAGNOSIS — Z87891 Personal history of nicotine dependence: Secondary | ICD-10-CM | POA: Diagnosis not present

## 2020-03-17 DIAGNOSIS — I4819 Other persistent atrial fibrillation: Secondary | ICD-10-CM | POA: Insufficient documentation

## 2020-03-17 DIAGNOSIS — I4510 Unspecified right bundle-branch block: Secondary | ICD-10-CM | POA: Insufficient documentation

## 2020-03-17 DIAGNOSIS — Z5112 Encounter for antineoplastic immunotherapy: Secondary | ICD-10-CM | POA: Diagnosis not present

## 2020-03-17 DIAGNOSIS — Z7984 Long term (current) use of oral hypoglycemic drugs: Secondary | ICD-10-CM | POA: Insufficient documentation

## 2020-03-17 DIAGNOSIS — C641 Malignant neoplasm of right kidney, except renal pelvis: Secondary | ICD-10-CM | POA: Insufficient documentation

## 2020-03-17 DIAGNOSIS — M549 Dorsalgia, unspecified: Secondary | ICD-10-CM | POA: Diagnosis not present

## 2020-03-17 DIAGNOSIS — E785 Hyperlipidemia, unspecified: Secondary | ICD-10-CM | POA: Insufficient documentation

## 2020-03-17 DIAGNOSIS — E1122 Type 2 diabetes mellitus with diabetic chronic kidney disease: Secondary | ICD-10-CM | POA: Diagnosis not present

## 2020-03-17 DIAGNOSIS — I823 Embolism and thrombosis of renal vein: Secondary | ICD-10-CM | POA: Insufficient documentation

## 2020-03-17 DIAGNOSIS — Z7901 Long term (current) use of anticoagulants: Secondary | ICD-10-CM | POA: Diagnosis not present

## 2020-03-17 DIAGNOSIS — D49511 Neoplasm of unspecified behavior of right kidney: Secondary | ICD-10-CM

## 2020-03-17 DIAGNOSIS — I129 Hypertensive chronic kidney disease with stage 1 through stage 4 chronic kidney disease, or unspecified chronic kidney disease: Secondary | ICD-10-CM | POA: Insufficient documentation

## 2020-03-17 DIAGNOSIS — Z79899 Other long term (current) drug therapy: Secondary | ICD-10-CM | POA: Insufficient documentation

## 2020-03-17 MED ORDER — PROCHLORPERAZINE MALEATE 10 MG PO TABS: 10.0000 mg | ORAL_TABLET | Freq: Four times a day (QID) | ORAL | 0 refills | Status: DC | PRN

## 2020-03-17 NOTE — Progress Notes (Signed)
Reason for the request:    Kidney cancer  HPI: I was asked by Dr. Alinda Money to evaluate Latasha Hodges after recent diagnosis of renal cell carcinoma.  She is a 75 year old woman with history of diabetes, hypertension and hyperlipidemia who had an incidental finding of an abnormal kidney mass on MRI.  On December 20, 2019 she had an MRI of her lumbar spine evaluate for back pain and found to have an incidental right renal mass.  MRI of the abdomen obtained on December 28, 2019 showed a 6.6 x 6.9 x 4.9 cm exophytic mass in the posterior upper right kidney.  Thrombus in the right renal vein was noted no extension into the IVC.  Based on these findings, she underwent a right robotic assisted laparoscopic radical nephrectomy completed by Dr. Alinda Money on January 29, 2020.  The final pathology showed a 7.0 cm clear cell renal cell carcinoma with tumor extends into the renal vein, renal capsule and renal sinus fat indicating T3a disease with 1 lymph node involvement.  No sarcomatoid or rhabdoid features noted with histological grade of G3.  Margins were negative as well.  She was referred for evaluation regarding additional therapy.  Imaging studies of the chest did not show any evidence of metastatic disease on presentation.  Clinically, she reports feeling reasonably well and has recovered from surgery.  She denies any flank pain or hematuria.  She denies any abdominal discomfort.  She does report chronic back pain which has not changed dramatically.  She continues to ambulate and lives independently.   She does not report any headaches, blurry vision, syncope or seizures. Does not report any fevers, chills or sweats.  Does not report any cough, wheezing or hemoptysis.  Does not report any chest pain, palpitation, orthopnea or leg edema.  Does not report any nausea, vomiting or abdominal pain.  Does not report any constipation or diarrhea.  Does not report any skeletal complaints.    Does not report frequency, urgency  or hematuria.  Does not report any skin rashes or lesions. Does not report any heat or cold intolerance.  Does not report any lymphadenopathy or petechiae.  Does not report any anxiety or depression.  Remaining review of systems is negative.    Past Medical History:  Diagnosis Date  . Arthritis   . Bronchitis, chronic, simple ([place])   . Chronic kidney disease   . Diabetes mellitus   . Hyperlipidemia   . Hypertension   . Obesity   . Persistent atrial fibrillation ([place])   . RBBB (right bundle branch block) 11/05/2017  :  Past Surgical History:  Procedure Laterality Date  . COLONOSCOPY  12/29/2010   Procedure: COLONOSCOPY;  Surgeon: Owens Loffler, MD;  Location: [facility];  Service: Endoscopy;  Laterality: N/A;  . ROBOT ASSISTED LAPAROSCOPIC NEPHRECTOMY N/A 01/29/2020   Procedure: XI ROBOTIC ASSISTED LAPAROSCOPIC RADICAL NEPHRECTOMY;  Surgeon: Raynelle Bring, MD;  Location: [facility];  Service: Urology;  Laterality: N/A;  . TUBAL LIGATION  1980  :   Current Outpatient Medications:  .  Dulaglutide (TRULICITY) 1.5 TF/5.7DU SOPN, Inject 1.5 mg into the skin every Sunday., Disp: , Rfl:  .  fenofibrate micronized (LOFIBRA) 200 MG capsule, TAKE 1 CAPSULE BY MOUTH DAILY BEFORE BREAKFAST (Patient taking differently: Take 200 mg by mouth daily before breakfast.), Disp: 90 capsule, Rfl: 2 .  flecainide (TAMBOCOR) 50 MG tablet, TAKE 1 TABLET(50 MG) BY MOUTH TWICE DAILY (Patient taking differently: Take 50 mg by mouth 2 (two) times daily.), Disp: 180  tablet, Rfl: 3 .  HYDROcodone-acetaminophen (NORCO/VICODIN) 5-325 MG tablet, Take 1-2 tablets by mouth every 6 (six) hours as needed for moderate pain or severe pain., Disp: 20 tablet, Rfl: 0 .  lisinopril (ZESTRIL) 20 MG tablet, TAKE 1 TABLET(20 MG) BY MOUTH DAILY (Patient taking differently: Take 20 mg by mouth at bedtime.), Disp: 90 tablet, Rfl: 3 .  magnesium oxide (MAG-OX) 400 MG tablet, Take 800 mg by mouth at bedtime., Disp: , Rfl:  .  metFORMIN  (GLUCOPHAGE) 1000 MG tablet, Take 1,000 mg by mouth 2 (two) times daily., Disp: , Rfl: 0 .  metoprolol succinate (TOPROL-XL) 50 MG 24 hr tablet, Take 1 tablet (50 mg total) by mouth daily. (Patient taking differently: Take 50 mg by mouth at bedtime.), Disp: 90 tablet, Rfl: 3 .  simvastatin (ZOCOR) 40 MG tablet, TAKE 1 TABLET(40 MG) BY MOUTH DAILY AT 6 PM. (Patient taking differently: Take 40 mg by mouth daily at 6 PM.), Disp: 90 tablet, Rfl: 2 .  warfarin (COUMADIN) 5 MG tablet, TAKE BY MOUTH AS DIRECTED EVERY DAY, Disp: 90 tablet, Rfl: 1:  Allergies  Allergen Reactions  . Iodinated Diagnostic Agents Hives, Swelling and Rash  . No [employer] Food Allergies     Seafood- rash   . Penicillins Rash    Childhood reaction.  . Sulfa Drugs Cross Reactors Rash    Childhood reaction.  . Theophyllines Rash  :  Family History  Problem Relation Age of Onset  . Cancer Mother        BRAIN/THROAT  . Diabetes Father   . Hypertension Father   . Colonic polyp Cousin   . Colon cancer Neg Hx   :  Social History   Socioeconomic History  . Marital status: Divorced    Spouse name: Not on file  . Number of children: 2  . Years of education: Not on file  . Highest education level: Not on file  Occupational History  . Occupation: ADMIN ASSISTANT  Tobacco Use  . Smoking status: Former Smoker    Quit date: 12/28/1981    Years since quitting: 38.2  . Smokeless tobacco: Never Used  Vaping Use  . Vaping Use: Never used  Substance and Sexual Activity  . Alcohol use: Yes    Comment: occas.  . Drug use: No  . Sexual activity: Never    Birth control/protection: Post-menopausal  Other Topics Concern  . Not on file  Social History Narrative   0 caffeine drinks daily    Social Determinants of Health   Financial Resource Strain: Not on file  Food Insecurity: Not on file  Transportation Needs: Not on file  Physical Activity: Not on file  Stress: Not on file  Social Connections: Not on file   Intimate Partner Violence: Not on file  :  Pertinent items are noted in HPI.  Exam: Blood pressure (!) 133/58, pulse 63, temperature (!) 97 F (36.1 C), temperature source Tympanic, resp. rate 18, height 5\' 3"  (1.6 m), weight 203 lb 8 oz (92.3 kg), SpO2 99 %. ECOG 1 General appearance: alert and cooperative appeared without distress. Head: atraumatic without any abnormalities. Eyes: conjunctivae/corneas clear. PERRL.  Sclera anicteric. Throat: lips, mucosa, and tongue normal; without oral thrush or ulcers. Resp: clear to auscultation bilaterally without rhonchi, wheezes or dullness to percussion. Cardio: regular rate and rhythm, S1, S2 normal, no murmur, click, rub or gallop GI: soft, non-tender; bowel sounds normal; no masses,  no organomegaly Skin: Skin color, texture, turgor normal. No rashes or  lesions Lymph nodes: Cervical, supraclavicular, and axillary nodes normal. Neurologic: Grossly normal without any motor, sensory or deep tendon reflexes. Musculoskeletal: No joint deformity or effusion.    Assessment and Plan:    75 year old woman with:  1.  Clear-cell renal cell carcinoma of the right kidney diagnosed in November 2021.  She underwent right robotic assisted laparoscopic radical nephrectomy on January 29, 2020.  He final pathology showed T3a and 1 tumor with clear-cell histology and grade 3 tumor.  Her case was discussed with the GU tumor board and treatment options were discussed today with the patient face-to-face.  The role of adjuvant therapy versus active surveillance and observation were discussed at this time.  The benefits associated with Pembrolizumab every 3 weeks for a total of 17 cycles to complete 1 year of therapy were reviewed.  Improvement in disease-free survival and potentially improvement overall survival were discussed.  Complications include nausea, fatigue and immune mediated complications were reiterated.  After discussion today, she is agreeable to  proceed after education class.  She will receive Pembrolizumab 200 mg every 3 weeks for a total of 17 cycles to complete a year.  2.  IV access: Risks and benefits of a Port-A-Cath insertion were discussed.  Complications that include thrombosis, bleeding and infection were reiterated.  After discussion she would like to think about it and defer that option at least for the time being.  3.  Antiemetics: Prescription for Compazine will be made available to her.  4.  Immune mediated complications: These were discussed in detail including thyroid disease, pneumonitis, colitis, hepatitis in addition to others.  5.  Follow-up: We will be in the near future to start therapy.  60  minutes were dedicated to this visit. The time was spent on reviewing laboratory data, imaging studies, discussing treatment options,  and answering questions regarding future plan.    A copy of this consult has been forwarded to the requesting physician.

## 2020-03-17 NOTE — Progress Notes (Signed)
START OFF PATHWAY REGIMEN - Renal Cell   [id number]:Pembrolizumab 200 mg IV D1 q21 Days:   A cycle is every 21 days:     Pembrolizumab   **Always confirm dose/schedule in your pharmacy ordering system**  Patient Characteristics: Postoperative without Neoadjuvant Therapy (Pathologic Staging), Stage I, II, III Therapeutic Status: Postoperative without Neoadjuvant Therapy (Pathologic Staging) AJCC M Category: cM0 AJCC 8 Stage Grouping: III AJCC T Category: pT3a AJCC N Category: pN1 Intent of Therapy: Curative Intent, Discussed with Patient 

## 2020-03-19 ENCOUNTER — Telehealth: Payer: Self-pay | Admitting: Oncology

## 2020-03-19 NOTE — Telephone Encounter (Signed)
Scheduled per 02/02 los, patient has been called and notified.

## 2020-03-23 DIAGNOSIS — M4316 Spondylolisthesis, lumbar region: Secondary | ICD-10-CM | POA: Diagnosis not present

## 2020-03-24 ENCOUNTER — Encounter: Payer: Self-pay | Admitting: Oncology

## 2020-03-24 ENCOUNTER — Inpatient Hospital Stay: Payer: Medicare Other

## 2020-03-24 ENCOUNTER — Other Ambulatory Visit: Payer: Self-pay

## 2020-03-24 NOTE — Progress Notes (Signed)
Met with patient at registration to introduce myself as Arboriculturist and to offer available assistance.  Discussed one-time $1000 Radio broadcast assistant to assist with personal expenses while going through treatment. Patient interested in applying and was able to show income. She will bring paper documentation at next appointment.  Approved for one-time $1000 [employer]. Went over expense sheet in detail and how they are covered. Gave her a copy of the approval letter and expense sheet along with the Outpatient pharmacy information. She denied needs today.  She has my card for any additional financial questions or concerns.

## 2020-03-24 NOTE — Progress Notes (Signed)
Pharmacist Chemotherapy Monitoring - Initial Assessment    Anticipated start date: 03/31/2020   Regimen:  . Are orders appropriate based on the patient's diagnosis, regimen, and cycle? Yes . Does the plan date match the patient's scheduled date? Yes . Is the sequencing of drugs appropriate? Yes . Are the premedications appropriate for the patient's regimen? Yes . Prior Authorization for treatment is: Pending o If applicable, is the correct biosimilar selected based on the patient's insurance? not applicable  Organ Function and Labs: Marland Kitchen Are dose adjustments needed based on the patient's renal function, hepatic function, or hematologic function? Yes . Are appropriate labs ordered prior to the start of patient's treatment? Yes . Other organ system assessment, if indicated: N/A . The following baseline labs, if indicated, have been ordered: pembrolizumab: baseline TSH +/- T4  Dose Assessment: . Are the drug doses appropriate? Yes . Are the following correct: o Drug concentrations Yes o IV fluid compatible with drug Yes o Administration routes Yes o Timing of therapy Yes . If applicable, does the patient have documented access for treatment and/or plans for port-a-cath placement? not applicable . If applicable, have lifetime cumulative doses been properly documented and assessed? yes Lifetime Dose Tracking  No doses have been documented on this patient for the following tracked chemicals: Doxorubicin, Epirubicin, Idarubicin, Daunorubicin, Mitoxantrone, Bleomycin, Oxaliplatin, Carboplatin, Liposomal Doxorubicin  o   Toxicity Monitoring/Prevention: . The patient has the following take home antiemetics prescribed: Prochlorperazine . The patient has the following take home medications prescribed: N/A . Medication allergies and previous infusion related reactions, if applicable, have been reviewed and addressed. No . The patient's current medication list has been assessed for drug-drug  interactions with their chemotherapy regimen. no significant drug-drug interactions were identified on review.  Order Review: . Are the treatment plan orders signed? Yes . Is the patient scheduled to see a provider prior to their treatment? no  I verify that I have reviewed each item in the above checklist and answered each question accordingly.  Larene Beach, [place], 03/24/2020  8:43 AM

## 2020-03-26 ENCOUNTER — Other Ambulatory Visit: Payer: Self-pay | Admitting: Cardiology

## 2020-03-30 DIAGNOSIS — M4316 Spondylolisthesis, lumbar region: Secondary | ICD-10-CM | POA: Diagnosis not present

## 2020-03-31 ENCOUNTER — Inpatient Hospital Stay: Payer: Medicare Other

## 2020-03-31 ENCOUNTER — Encounter: Payer: Self-pay | Admitting: Oncology

## 2020-03-31 ENCOUNTER — Other Ambulatory Visit: Payer: Self-pay

## 2020-03-31 VITALS — BP 128/47 | HR 57 | Temp 98.2°F | Resp 20 | Wt 205.0 lb

## 2020-03-31 DIAGNOSIS — Z79899 Other long term (current) drug therapy: Secondary | ICD-10-CM | POA: Diagnosis not present

## 2020-03-31 DIAGNOSIS — E785 Hyperlipidemia, unspecified: Secondary | ICD-10-CM | POA: Diagnosis not present

## 2020-03-31 DIAGNOSIS — I4510 Unspecified right bundle-branch block: Secondary | ICD-10-CM | POA: Diagnosis not present

## 2020-03-31 DIAGNOSIS — I129 Hypertensive chronic kidney disease with stage 1 through stage 4 chronic kidney disease, or unspecified chronic kidney disease: Secondary | ICD-10-CM | POA: Diagnosis not present

## 2020-03-31 DIAGNOSIS — Z87891 Personal history of nicotine dependence: Secondary | ICD-10-CM | POA: Diagnosis not present

## 2020-03-31 DIAGNOSIS — E1122 Type 2 diabetes mellitus with diabetic chronic kidney disease: Secondary | ICD-10-CM | POA: Diagnosis not present

## 2020-03-31 DIAGNOSIS — Z7901 Long term (current) use of anticoagulants: Secondary | ICD-10-CM | POA: Diagnosis not present

## 2020-03-31 DIAGNOSIS — M549 Dorsalgia, unspecified: Secondary | ICD-10-CM | POA: Diagnosis not present

## 2020-03-31 DIAGNOSIS — I4819 Other persistent atrial fibrillation: Secondary | ICD-10-CM | POA: Diagnosis not present

## 2020-03-31 DIAGNOSIS — Z7984 Long term (current) use of oral hypoglycemic drugs: Secondary | ICD-10-CM | POA: Diagnosis not present

## 2020-03-31 DIAGNOSIS — C641 Malignant neoplasm of right kidney, except renal pelvis: Secondary | ICD-10-CM | POA: Diagnosis not present

## 2020-03-31 DIAGNOSIS — D49511 Neoplasm of unspecified behavior of right kidney: Secondary | ICD-10-CM

## 2020-03-31 DIAGNOSIS — Z905 Acquired absence of kidney: Secondary | ICD-10-CM | POA: Diagnosis not present

## 2020-03-31 DIAGNOSIS — Z5112 Encounter for antineoplastic immunotherapy: Secondary | ICD-10-CM | POA: Diagnosis not present

## 2020-03-31 DIAGNOSIS — I823 Embolism and thrombosis of renal vein: Secondary | ICD-10-CM | POA: Diagnosis not present

## 2020-03-31 DIAGNOSIS — G8929 Other chronic pain: Secondary | ICD-10-CM | POA: Diagnosis not present

## 2020-03-31 LAB — CMP (CANCER CENTER ONLY)
ALT: 13 U/L (ref 0–44)
AST: 13 U/L — ABNORMAL LOW (ref 15–41)
Albumin: 3.7 g/dL (ref 3.5–5.0)
Alkaline Phosphatase: 45 U/L (ref 38–126)
Anion gap: 10 (ref 5–15)
BUN: 32 mg/dL — ABNORMAL HIGH (ref 8–23)
CO2: 23 mmol/L (ref 22–32)
Calcium: 9.4 mg/dL (ref 8.9–10.3)
Chloride: 107 mmol/L (ref 98–111)
Creatinine: 1.2 mg/dL — ABNORMAL HIGH (ref 0.44–1.00)
GFR, Estimated: 48 mL/min — ABNORMAL LOW (ref >60)
Glucose, Bld: 155 mg/dL — ABNORMAL HIGH (ref 70–99)
Potassium: 4.3 mmol/L (ref 3.5–5.1)
Sodium: 140 mmol/L (ref 135–145)
Total Bilirubin: 0.2 mg/dL — ABNORMAL LOW (ref 0.3–1.2)
Total Protein: 6.7 g/dL (ref 6.5–8.1)

## 2020-03-31 LAB — CBC WITH DIFFERENTIAL (CANCER CENTER ONLY)
Abs Immature Granulocytes: 0.02 10*3/uL (ref 0.00–0.07)
Basophils Absolute: 0 10*3/uL (ref 0.0–0.1)
Basophils Relative: 1 %
Eosinophils Absolute: 0.2 10*3/uL (ref 0.0–0.5)
Eosinophils Relative: 4 %
HCT: 35.4 % — ABNORMAL LOW (ref 36.0–46.0)
Hemoglobin: 11.4 g/dL — ABNORMAL LOW (ref 12.0–15.0)
Immature Granulocytes: 0 %
Lymphocytes Relative: 32 %
Lymphs Abs: 2.1 10*3/uL (ref 0.7–4.0)
MCH: 27.1 pg (ref 26.0–34.0)
MCHC: 32.2 g/dL (ref 30.0–36.0)
MCV: 84.1 fL (ref 80.0–100.0)
Monocytes Absolute: 0.6 10*3/uL (ref 0.1–1.0)
Monocytes Relative: 9 %
Neutro Abs: 3.6 10*3/uL (ref 1.7–7.7)
Neutrophils Relative %: 54 %
Platelet Count: 281 10*3/uL (ref 150–400)
RBC: 4.21 MIL/uL (ref 3.87–5.11)
RDW: 13.8 % (ref 11.5–15.5)
WBC Count: 6.6 10*3/uL (ref 4.0–10.5)
nRBC: 0 % (ref 0.0–0.2)

## 2020-03-31 LAB — TSH: TSH: 2.188 u[IU]/mL (ref 0.308–3.960)

## 2020-03-31 MED ORDER — SODIUM CHLORIDE 0.9 % IV SOLN
Freq: Once | INTRAVENOUS | Status: AC
  Filled 2020-03-31: qty 250

## 2020-03-31 MED ORDER — SODIUM CHLORIDE 0.9 % IV SOLN
200.0000 mg | Freq: Once | INTRAVENOUS | Status: AC
  Administered 2020-03-31: 200 mg via INTRAVENOUS
  Filled 2020-03-31: qty 8

## 2020-03-31 NOTE — Progress Notes (Signed)
Met with patient at registration to obtain documents for grant related expenses..   Made copies and gave originals back to patient.   She has my card for any additional financial questions or concerns.

## 2020-03-31 NOTE — Patient Instructions (Signed)
[facility] Discharge Instructions for Patients Receiving Chemotherapy  Today you received the following chemotherapy agents Pembrolizumab  To help prevent nausea and vomiting after your treatment, we encourage you to take your nausea medication as directed.  If you develop nausea and vomiting that is not controlled by your nausea medication, call the clinic.   BELOW ARE SYMPTOMS THAT SHOULD BE REPORTED IMMEDIATELY:  *FEVER GREATER THAN 100.5 F  *CHILLS WITH OR WITHOUT FEVER  NAUSEA AND VOMITING THAT IS NOT CONTROLLED WITH YOUR NAUSEA MEDICATION  *UNUSUAL SHORTNESS OF BREATH  *UNUSUAL BRUISING OR BLEEDING  TENDERNESS IN MOUTH AND THROAT WITH OR WITHOUT PRESENCE OF ULCERS  *URINARY PROBLEMS  *BOWEL PROBLEMS  UNUSUAL RASH Items with * indicate a potential emergency and should be followed up as soon as possible.  Feel free to call the clinic should you have any questions or concerns. The clinic phone number is [phone].  Please show the CHEMO ALERT CARD at check-in to the Emergency Department and triage nurse.  Pembrolizumab injection What is this medicine? PEMBROLIZUMAB (pem broe liz ue mab) is a monoclonal antibody. It is used to treat certain types of cancer. This medicine may be used for other purposes; ask your health care provider or pharmacist if you have questions. COMMON BRAND NAME(S): Keytruda What should I tell my health care provider before I take this medicine? They need to know if you have any of these conditions:  autoimmune diseases like Crohn's disease, ulcerative colitis, or lupus  have had or planning to have an allogeneic stem cell transplant (uses someone else's stem cells)  history of organ transplant  history of chest radiation  nervous system problems like myasthenia gravis or Guillain-Barre syndrome  an unusual or allergic reaction to pembrolizumab, other medicines, foods, dyes, or preservatives  pregnant or trying to get  pregnant  breast-feeding How should I use this medicine? This medicine is for infusion into a vein. It is given by a health care professional in a hospital or clinic setting. A special MedGuide will be given to you before each treatment. Be sure to read this information carefully each time. Talk to your pediatrician regarding the use of this medicine in children. While this drug may be prescribed for children as young as 6 months for selected conditions, precautions do apply. Overdosage: If you think you have taken too much of this medicine contact a poison control center or emergency room at once. NOTE: This medicine is only for you. Do not share this medicine with others. What if I miss a dose? It is important not to miss your dose. Call your doctor or health care professional if you are unable to keep an appointment. What may interact with this medicine? Interactions have not been studied. This list may not describe all possible interactions. Give your health care provider a list of all the medicines, herbs, non-prescription drugs, or dietary supplements you use. Also tell them if you smoke, drink alcohol, or use illegal drugs. Some items may interact with your medicine. What should I watch for while using this medicine? Your condition will be monitored carefully while you are receiving this medicine. You may need blood work done while you are taking this medicine. Do not become pregnant while taking this medicine or for 4 months after stopping it. Women should inform their doctor if they wish to become pregnant or think they might be pregnant. There is a potential for serious side effects to an unborn child. Talk to your   health care professional or pharmacist for more information. Do not breast-feed an infant while taking this medicine or for 4 months after the last dose. What side effects may I notice from receiving this medicine? Side effects that you should report to your doctor or health  care professional as soon as possible:  allergic reactions like skin rash, itching or hives, swelling of the face, lips, or tongue  bloody or black, tarry  breathing problems  changes in vision  chest pain  chills  confusion  constipation  cough  diarrhea  dizziness or feeling faint or lightheaded  fast or irregular heartbeat  fever  flushing  joint pain  low blood counts - this medicine may decrease the number of white blood cells, red blood cells and platelets. You may be at increased risk for infections and bleeding.  muscle pain  muscle weakness  pain, tingling, numbness in the hands or feet  persistent headache  redness, blistering, peeling or loosening of the skin, including inside the mouth  signs and symptoms of high blood sugar such as dizziness; dry mouth; dry skin; fruity breath; nausea; stomach pain; increased hunger or thirst; increased urination  signs and symptoms of kidney injury like trouble passing urine or change in the amount of urine  signs and symptoms of liver injury like dark urine, light-colored stools, loss of appetite, nausea, right upper belly pain, yellowing of the eyes or skin  sweating  swollen lymph nodes  weight loss Side effects that usually do not require medical attention (report to your doctor or health care professional if they continue or are bothersome):  decreased appetite  hair loss  tiredness This list may not describe all possible side effects. Call your doctor for medical advice about side effects. You may report side effects to FDA at 1-800-FDA-1088. Where should I keep my medicine? This drug is given in a hospital or clinic and will not be stored at home. NOTE: This sheet is a summary. It may not cover all possible information. If you have questions about this medicine, talk to your doctor, pharmacist, or health care provider.  2021 Elsevier/Gold Standard (2019-01-01 21:44:53)  

## 2020-04-06 DIAGNOSIS — M4316 Spondylolisthesis, lumbar region: Secondary | ICD-10-CM | POA: Diagnosis not present

## 2020-04-07 ENCOUNTER — Ambulatory Visit (INDEPENDENT_AMBULATORY_CARE_PROVIDER_SITE_OTHER): Payer: Medicare Other | Admitting: *Deleted

## 2020-04-07 ENCOUNTER — Other Ambulatory Visit: Payer: Self-pay

## 2020-04-07 DIAGNOSIS — Z5181 Encounter for therapeutic drug level monitoring: Secondary | ICD-10-CM

## 2020-04-07 DIAGNOSIS — Z7189 Other specified counseling: Secondary | ICD-10-CM

## 2020-04-07 DIAGNOSIS — H02831 Dermatochalasis of right upper eyelid: Secondary | ICD-10-CM | POA: Diagnosis not present

## 2020-04-07 DIAGNOSIS — H25813 Combined forms of age-related cataract, bilateral: Secondary | ICD-10-CM | POA: Diagnosis not present

## 2020-04-07 DIAGNOSIS — H5203 Hypermetropia, bilateral: Secondary | ICD-10-CM | POA: Diagnosis not present

## 2020-04-07 DIAGNOSIS — I4819 Other persistent atrial fibrillation: Secondary | ICD-10-CM

## 2020-04-07 DIAGNOSIS — H11153 Pinguecula, bilateral: Secondary | ICD-10-CM | POA: Diagnosis not present

## 2020-04-07 DIAGNOSIS — H02834 Dermatochalasis of left upper eyelid: Secondary | ICD-10-CM | POA: Diagnosis not present

## 2020-04-07 DIAGNOSIS — I480 Paroxysmal atrial fibrillation: Secondary | ICD-10-CM | POA: Diagnosis not present

## 2020-04-07 DIAGNOSIS — H524 Presbyopia: Secondary | ICD-10-CM | POA: Diagnosis not present

## 2020-04-07 LAB — POCT INR: INR: 2.3 (ref 2.0–3.0)

## 2020-04-07 NOTE — Patient Instructions (Signed)
Description   Continue taking 1/2 tablet every day except 1 tablet on Mondays, Wednesdays, and Fridays. Recheck 5 weeks. Call our office if you have any questions or if placed on any new medications [phone].

## 2020-04-08 DIAGNOSIS — M4316 Spondylolisthesis, lumbar region: Secondary | ICD-10-CM | POA: Diagnosis not present

## 2020-04-13 DIAGNOSIS — M4316 Spondylolisthesis, lumbar region: Secondary | ICD-10-CM | POA: Diagnosis not present

## 2020-04-20 ENCOUNTER — Telehealth: Payer: Self-pay | Admitting: Oncology

## 2020-04-20 DIAGNOSIS — I10 Essential (primary) hypertension: Secondary | ICD-10-CM | POA: Insufficient documentation

## 2020-04-20 DIAGNOSIS — M48062 Spinal stenosis, lumbar region with neurogenic claudication: Secondary | ICD-10-CM | POA: Diagnosis not present

## 2020-04-20 NOTE — Telephone Encounter (Signed)
Release: [id number]  Faxed ov note to [facility] fax# [phone]

## 2020-04-21 ENCOUNTER — Inpatient Hospital Stay (HOSPITAL_BASED_OUTPATIENT_CLINIC_OR_DEPARTMENT_OTHER): Payer: Medicare Other | Admitting: Oncology

## 2020-04-21 ENCOUNTER — Other Ambulatory Visit: Payer: Self-pay

## 2020-04-21 ENCOUNTER — Other Ambulatory Visit: Payer: Self-pay | Admitting: Cardiology

## 2020-04-21 ENCOUNTER — Inpatient Hospital Stay: Payer: Medicare Other | Attending: Oncology

## 2020-04-21 ENCOUNTER — Inpatient Hospital Stay: Payer: Medicare Other

## 2020-04-21 VITALS — BP 135/61 | HR 67 | Temp 97.6°F | Resp 17 | Ht 63.0 in | Wt 206.3 lb

## 2020-04-21 DIAGNOSIS — Z7984 Long term (current) use of oral hypoglycemic drugs: Secondary | ICD-10-CM | POA: Insufficient documentation

## 2020-04-21 DIAGNOSIS — D49511 Neoplasm of unspecified behavior of right kidney: Secondary | ICD-10-CM

## 2020-04-21 DIAGNOSIS — Z7901 Long term (current) use of anticoagulants: Secondary | ICD-10-CM | POA: Insufficient documentation

## 2020-04-21 DIAGNOSIS — E119 Type 2 diabetes mellitus without complications: Secondary | ICD-10-CM | POA: Diagnosis not present

## 2020-04-21 DIAGNOSIS — Z5112 Encounter for antineoplastic immunotherapy: Secondary | ICD-10-CM | POA: Diagnosis not present

## 2020-04-21 DIAGNOSIS — C641 Malignant neoplasm of right kidney, except renal pelvis: Secondary | ICD-10-CM | POA: Diagnosis not present

## 2020-04-21 DIAGNOSIS — Z79899 Other long term (current) drug therapy: Secondary | ICD-10-CM | POA: Insufficient documentation

## 2020-04-21 DIAGNOSIS — I10 Essential (primary) hypertension: Secondary | ICD-10-CM | POA: Insufficient documentation

## 2020-04-21 DIAGNOSIS — Z905 Acquired absence of kidney: Secondary | ICD-10-CM | POA: Diagnosis not present

## 2020-04-21 LAB — CMP (CANCER CENTER ONLY)
ALT: 24 U/L (ref 0–44)
AST: 20 U/L (ref 15–41)
Albumin: 3.6 g/dL (ref 3.5–5.0)
Alkaline Phosphatase: 50 U/L (ref 38–126)
Anion gap: 11 (ref 5–15)
BUN: 29 mg/dL — ABNORMAL HIGH (ref 8–23)
CO2: 23 mmol/L (ref 22–32)
Calcium: 9.4 mg/dL (ref 8.9–10.3)
Chloride: 107 mmol/L (ref 98–111)
Creatinine: 1.1 mg/dL — ABNORMAL HIGH (ref 0.44–1.00)
GFR, Estimated: 53 mL/min — ABNORMAL LOW (ref >60)
Glucose, Bld: 165 mg/dL — ABNORMAL HIGH (ref 70–99)
Potassium: 4.2 mmol/L (ref 3.5–5.1)
Sodium: 141 mmol/L (ref 135–145)
Total Bilirubin: 0.3 mg/dL (ref 0.3–1.2)
Total Protein: 7 g/dL (ref 6.5–8.1)

## 2020-04-21 LAB — CBC WITH DIFFERENTIAL (CANCER CENTER ONLY)
Abs Immature Granulocytes: 0.02 10*3/uL (ref 0.00–0.07)
Basophils Absolute: 0 10*3/uL (ref 0.0–0.1)
Basophils Relative: 1 %
Eosinophils Absolute: 0.3 10*3/uL (ref 0.0–0.5)
Eosinophils Relative: 6 %
HCT: 34.6 % — ABNORMAL LOW (ref 36.0–46.0)
Hemoglobin: 11.3 g/dL — ABNORMAL LOW (ref 12.0–15.0)
Immature Granulocytes: 0 %
Lymphocytes Relative: 25 %
Lymphs Abs: 1.3 10*3/uL (ref 0.7–4.0)
MCH: 27.4 pg (ref 26.0–34.0)
MCHC: 32.7 g/dL (ref 30.0–36.0)
MCV: 83.8 fL (ref 80.0–100.0)
Monocytes Absolute: 0.6 10*3/uL (ref 0.1–1.0)
Monocytes Relative: 11 %
Neutro Abs: 3.2 10*3/uL (ref 1.7–7.7)
Neutrophils Relative %: 57 %
Platelet Count: 289 10*3/uL (ref 150–400)
RBC: 4.13 MIL/uL (ref 3.87–5.11)
RDW: 14.4 % (ref 11.5–15.5)
WBC Count: 5.4 10*3/uL (ref 4.0–10.5)
nRBC: 0 % (ref 0.0–0.2)

## 2020-04-21 LAB — TSH: TSH: 2.018 u[IU]/mL (ref 0.308–3.960)

## 2020-04-21 MED ORDER — SODIUM CHLORIDE 0.9 % IV SOLN
200.0000 mg | Freq: Once | INTRAVENOUS | Status: AC
  Administered 2020-04-21: 200 mg via INTRAVENOUS
  Filled 2020-04-21: qty 8

## 2020-04-21 MED ORDER — SODIUM CHLORIDE 0.9 % IV SOLN
Freq: Once | INTRAVENOUS | Status: AC
  Filled 2020-04-21: qty 250

## 2020-04-21 NOTE — Patient Instructions (Signed)
[facility] Discharge Instructions for Patients Receiving Chemotherapy  Today you received the following chemotherapy agents Pembrolizumab  To help prevent nausea and vomiting after your treatment, we encourage you to take your nausea medication as directed.  If you develop nausea and vomiting that is not controlled by your nausea medication, call the clinic.   BELOW ARE SYMPTOMS THAT SHOULD BE REPORTED IMMEDIATELY:  *FEVER GREATER THAN 100.5 F  *CHILLS WITH OR WITHOUT FEVER  NAUSEA AND VOMITING THAT IS NOT CONTROLLED WITH YOUR NAUSEA MEDICATION  *UNUSUAL SHORTNESS OF BREATH  *UNUSUAL BRUISING OR BLEEDING  TENDERNESS IN MOUTH AND THROAT WITH OR WITHOUT PRESENCE OF ULCERS  *URINARY PROBLEMS  *BOWEL PROBLEMS  UNUSUAL RASH Items with * indicate a potential emergency and should be followed up as soon as possible.  Feel free to call the clinic should you have any questions or concerns. The clinic phone number is [phone].  Please show the CHEMO ALERT CARD at check-in to the Emergency Department and triage nurse.  Pembrolizumab injection What is this medicine? PEMBROLIZUMAB (pem broe liz ue mab) is a monoclonal antibody. It is used to treat certain types of cancer. This medicine may be used for other purposes; ask your health care provider or pharmacist if you have questions. COMMON BRAND NAME(S): Keytruda What should I tell my health care provider before I take this medicine? They need to know if you have any of these conditions:  autoimmune diseases like Crohn's disease, ulcerative colitis, or lupus  have had or planning to have an allogeneic stem cell transplant (uses someone else's stem cells)  history of organ transplant  history of chest radiation  nervous system problems like myasthenia gravis or Guillain-Barre syndrome  an unusual or allergic reaction to pembrolizumab, other medicines, foods, dyes, or preservatives  pregnant or trying to get  pregnant  breast-feeding How should I use this medicine? This medicine is for infusion into a vein. It is given by a health care professional in a hospital or clinic setting. A special MedGuide will be given to you before each treatment. Be sure to read this information carefully each time. Talk to your pediatrician regarding the use of this medicine in children. While this drug may be prescribed for children as young as 6 months for selected conditions, precautions do apply. Overdosage: If you think you have taken too much of this medicine contact a poison control center or emergency room at once. NOTE: This medicine is only for you. Do not share this medicine with others. What if I miss a dose? It is important not to miss your dose. Call your doctor or health care professional if you are unable to keep an appointment. What may interact with this medicine? Interactions have not been studied. This list may not describe all possible interactions. Give your health care provider a list of all the medicines, herbs, non-prescription drugs, or dietary supplements you use. Also tell them if you smoke, drink alcohol, or use illegal drugs. Some items may interact with your medicine. What should I watch for while using this medicine? Your condition will be monitored carefully while you are receiving this medicine. You may need blood work done while you are taking this medicine. Do not become pregnant while taking this medicine or for 4 months after stopping it. Women should inform their doctor if they wish to become pregnant or think they might be pregnant. There is a potential for serious side effects to an unborn child. Talk to your   health care professional or pharmacist for more information. Do not breast-feed an infant while taking this medicine or for 4 months after the last dose. What side effects may I notice from receiving this medicine? Side effects that you should report to your doctor or health  care professional as soon as possible:  allergic reactions like skin rash, itching or hives, swelling of the face, lips, or tongue  bloody or black, tarry  breathing problems  changes in vision  chest pain  chills  confusion  constipation  cough  diarrhea  dizziness or feeling faint or lightheaded  fast or irregular heartbeat  fever  flushing  joint pain  low blood counts - this medicine may decrease the number of white blood cells, red blood cells and platelets. You may be at increased risk for infections and bleeding.  muscle pain  muscle weakness  pain, tingling, numbness in the hands or feet  persistent headache  redness, blistering, peeling or loosening of the skin, including inside the mouth  signs and symptoms of high blood sugar such as dizziness; dry mouth; dry skin; fruity breath; nausea; stomach pain; increased hunger or thirst; increased urination  signs and symptoms of kidney injury like trouble passing urine or change in the amount of urine  signs and symptoms of liver injury like dark urine, light-colored stools, loss of appetite, nausea, right upper belly pain, yellowing of the eyes or skin  sweating  swollen lymph nodes  weight loss Side effects that usually do not require medical attention (report to your doctor or health care professional if they continue or are bothersome):  decreased appetite  hair loss  tiredness This list may not describe all possible side effects. Call your doctor for medical advice about side effects. You may report side effects to FDA at 1-800-FDA-1088. Where should I keep my medicine? This drug is given in a hospital or clinic and will not be stored at home. NOTE: This sheet is a summary. It may not cover all possible information. If you have questions about this medicine, talk to your doctor, pharmacist, or health care provider.  2021 Elsevier/Gold Standard (2019-01-01 21:44:53)  

## 2020-04-21 NOTE — Progress Notes (Signed)
Hematology and Oncology Follow Up Visit  Latasha Hodges [id number] [birth year] 75 y.o. 04/21/2020 12:27 PM Kelton Pillar, MDGriffin, [name], MD   Principle Diagnosis: 75 year old woman with T3aN1 clear-cell renal cell carcinoma diagnosed in December 2021.  She presented with right renal mass measuring 6.6 x 6.9 cm.   Prior Therapy:   She is status post right robotic assisted laparoscopic radical nephrectomy completed by Dr. Alinda Money on January 29, 2020.  The final pathology showed a 7.0 cm clear cell renal cell carcinoma with tumor extends into the renal vein, renal capsule and renal sinus fat indicating T3a disease with 1 lymph node involvement.  No sarcomatoid or rhabdoid features noted with histological grade of G3.  Current therapy: Adjuvant Pembrolizumab 200 mg every 3 weeks started on March 31, 2020.  She is here for cycle 2 of therapy.  Interim History: Latasha Hodges returns today for a follow-up evaluation.  Since the last visit, she received Pembrolizumab without any major complications.  She denies any fatigue tiredness or pruritus.  She denies any shortness of breath or changes in her bowels.  Her performance status quality of life remained excellent.     Medications: I have reviewed the patient's current medications.  Current Outpatient Medications  Medication Sig Dispense Refill  . Dulaglutide (TRULICITY) 1.5 DH/7.4BU SOPN Inject 1.5 mg into the skin every Sunday.    . fenofibrate micronized (LOFIBRA) 200 MG capsule TAKE 1 CAPSULE BY MOUTH DAILY BEFORE BREAKFAST (Patient taking differently: Take 200 mg by mouth daily before breakfast.) 90 capsule 2  . flecainide (TAMBOCOR) 50 MG tablet TAKE 1 TABLET(50 MG) BY MOUTH TWICE DAILY (Patient taking differently: Take 50 mg by mouth 2 (two) times daily.) 180 tablet 3  . HYDROcodone-acetaminophen (NORCO/VICODIN) 5-325 MG tablet Take 1-2 tablets by mouth every 6 (six) hours as needed for moderate pain or severe pain. 20 tablet 0  .  lisinopril (ZESTRIL) 20 MG tablet TAKE 1 TABLET(20 MG) BY MOUTH DAILY 90 tablet 3  . magnesium oxide (MAG-OX) 400 MG tablet Take 800 mg by mouth at bedtime.    . metFORMIN (GLUCOPHAGE) 1000 MG tablet Take 1,000 mg by mouth 2 (two) times daily.  0  . metoprolol succinate (TOPROL-XL) 50 MG 24 hr tablet Take 1 tablet (50 mg total) by mouth daily. (Patient taking differently: Take 50 mg by mouth at bedtime.) 90 tablet 3  . prochlorperazine (COMPAZINE) 10 MG tablet Take 1 tablet (10 mg total) by mouth every 6 (six) hours as needed for nausea or vomiting. 30 tablet 0  . simvastatin (ZOCOR) 40 MG tablet TAKE 1 TABLET(40 MG) BY MOUTH DAILY AT 6 PM 90 tablet 3  . warfarin (COUMADIN) 5 MG tablet TAKE BY MOUTH AS DIRECTED EVERY DAY 90 tablet 1   No current facility-administered medications for this visit.     Allergies:  Allergies  Allergen Reactions  . Iodinated Diagnostic Agents Hives, Swelling and Rash  . No [employer]alt[employer] Food Allergies     Seafood- rash   . Penicillins Rash    Childhood reaction.  . Sulfa Drugs Cross Reactors Rash    Childhood reaction.  . Theophyllines Rash      Physical Exam: Blood pressure 135/61, pulse 67, temperature 97.6 F (36.4 C), resp. rate 17, height 5\' 3"  (1.6 m), weight 206 lb 4.8 oz (93.6 kg), SpO2 100 %.   ECOG:     General appearance: Comfortable appearing without any discomfort Head: Normocephalic without any trauma Oropharynx: Mucous membranes are moist and pink without any thrush  or ulcers. Eyes: Pupils are equal and round reactive to light. Lymph nodes: No cervical, supraclavicular, inguinal or axillary lymphadenopathy.   Heart:regular rate and rhythm.  S1 and S2 without leg edema. Lung: Clear without any rhonchi or wheezes.  No dullness to percussion. Abdomin: Soft, nontender, nondistended with good bowel sounds.  No hepatosplenomegaly. Musculoskeletal: No joint deformity or effusion.  Full range of motion noted. Neurological: No deficits  noted on motor, sensory and deep tendon reflex exam. Skin: No petechial rash or dryness.  Appeared moist.      Lab Results: Lab Results  Component Value Date   WBC 6.6 03/31/2020   HGB 11.4 (L) 03/31/2020   HCT 35.4 (L) 03/31/2020   MCV 84.1 03/31/2020   PLT 281 03/31/2020     Chemistry      Component Value Date/Time   NA 140 03/31/2020 1217   NA 139 08/20/2017 1147   K 4.3 03/31/2020 1217   CL 107 03/31/2020 1217   CO2 23 03/31/2020 1217   BUN 32 (H) 03/31/2020 1217   BUN 18 08/20/2017 1147   CREATININE 1.20 (H) 03/31/2020 1217      Component Value Date/Time   CALCIUM 9.4 03/31/2020 1217   ALKPHOS 45 03/31/2020 1217   AST 13 (L) 03/31/2020 1217   ALT 13 03/31/2020 1217   BILITOT 0.2 (L) 03/31/2020 1217          Impression and Plan:   75 year old woman with:  1.    T3a N1 right kidney cancer diagnosed in November 2021.  She was found to have clear-cell after laparoscopic radical nephrectomy on January 29, 2020.    She is currently receiving adjuvant Pembrolizumab given her high risk renal cell carcinoma.  Risks and benefits of continuing this treatment and updated progression free survival analysis of the study developed to the approval of this medication was shared with the patient.  The evidence to suggest improvement of Pembrolizumab versus placebo at this time and I recommended continuing treatment.  Consideration for increasing the dose to 400 mg every 6 weeks for convenience was also reviewed.   For the time being she is agreeable to proceed and would like to keep it at every 3-week interval.  2.  IV access: Peripheral veins are currently in use without any issues.  3.  Antiemetics: No nausea or vomiting reported.  Compazine is available to her.  4.  Immune mediated complications: I continue to educate her about potential complications including pneumonitis, colitis or thyroid disease among others.  5.  Follow-up: In 3 weeks for repeat evaluation  and next infusion.  60  minutes were spent on this encounter.  The time was dedicated to reviewing disease status, discussing treatment options and outlining future plan of care.   Zola Button, MD 3/9/202212:27 PM

## 2020-04-27 ENCOUNTER — Other Ambulatory Visit: Payer: Self-pay | Admitting: Cardiology

## 2020-04-28 ENCOUNTER — Encounter: Payer: Self-pay | Admitting: Oncology

## 2020-05-03 ENCOUNTER — Encounter: Payer: Self-pay | Admitting: Oncology

## 2020-05-03 ENCOUNTER — Telehealth: Payer: Self-pay

## 2020-05-03 NOTE — Telephone Encounter (Signed)
-----   Message from Wyatt Portela, MD sent at 05/03/2020 12:01 PM EDT ----- Regarding: RE: My chart message I agree about your recommendations regarding the itching. This is related to [place]. I recommend keeping alcohol to a minimum.   Thanks    ----- Message ----- From: Tami Lin, RN Sent: 05/03/2020  11:43 AM EDT To: Wyatt Portela, MD Subject: My chart message                               Patient sent the questions below via my chart. Do you want me tell her that alcohol should be kept to a minimum while on Keytruda or to avoid alcohol completely? She also asked about itching and I was going to instruct her to avoid showering in hot water, keep skin moisturized, and try an over the counter hydrocortisone cream.   Latasha Hodges    Can I have a glass of wine while on Keytruda?  Also, I am itchy.  I know that is a side effect of Keytruda.  is there anything I can do for it?

## 2020-05-06 DIAGNOSIS — L309 Dermatitis, unspecified: Secondary | ICD-10-CM | POA: Diagnosis not present

## 2020-05-12 ENCOUNTER — Ambulatory Visit (INDEPENDENT_AMBULATORY_CARE_PROVIDER_SITE_OTHER): Payer: Medicare Other | Admitting: *Deleted

## 2020-05-12 ENCOUNTER — Ambulatory Visit: Payer: Medicare Other | Admitting: Oncology

## 2020-05-12 ENCOUNTER — Other Ambulatory Visit: Payer: Medicare Other

## 2020-05-12 ENCOUNTER — Inpatient Hospital Stay: Payer: Medicare Other

## 2020-05-12 ENCOUNTER — Ambulatory Visit: Payer: Medicare Other

## 2020-05-12 ENCOUNTER — Other Ambulatory Visit: Payer: Self-pay

## 2020-05-12 VITALS — BP 126/48 | HR 65 | Temp 98.8°F | Resp 18 | Wt 202.5 lb

## 2020-05-12 DIAGNOSIS — Z7984 Long term (current) use of oral hypoglycemic drugs: Secondary | ICD-10-CM | POA: Diagnosis not present

## 2020-05-12 DIAGNOSIS — Z5181 Encounter for therapeutic drug level monitoring: Secondary | ICD-10-CM

## 2020-05-12 DIAGNOSIS — I480 Paroxysmal atrial fibrillation: Secondary | ICD-10-CM

## 2020-05-12 DIAGNOSIS — I10 Essential (primary) hypertension: Secondary | ICD-10-CM | POA: Diagnosis not present

## 2020-05-12 DIAGNOSIS — E119 Type 2 diabetes mellitus without complications: Secondary | ICD-10-CM | POA: Diagnosis not present

## 2020-05-12 DIAGNOSIS — Z79899 Other long term (current) drug therapy: Secondary | ICD-10-CM | POA: Diagnosis not present

## 2020-05-12 DIAGNOSIS — Z5112 Encounter for antineoplastic immunotherapy: Secondary | ICD-10-CM | POA: Diagnosis not present

## 2020-05-12 DIAGNOSIS — D49511 Neoplasm of unspecified behavior of right kidney: Secondary | ICD-10-CM

## 2020-05-12 DIAGNOSIS — C641 Malignant neoplasm of right kidney, except renal pelvis: Secondary | ICD-10-CM | POA: Diagnosis not present

## 2020-05-12 DIAGNOSIS — Z905 Acquired absence of kidney: Secondary | ICD-10-CM | POA: Diagnosis not present

## 2020-05-12 DIAGNOSIS — Z7901 Long term (current) use of anticoagulants: Secondary | ICD-10-CM | POA: Diagnosis not present

## 2020-05-12 LAB — CMP (CANCER CENTER ONLY)
ALT: 58 U/L — ABNORMAL HIGH (ref 0–44)
AST: 51 U/L — ABNORMAL HIGH (ref 15–41)
Albumin: 3.4 g/dL — ABNORMAL LOW (ref 3.5–5.0)
Alkaline Phosphatase: 75 U/L (ref 38–126)
Anion gap: 11 (ref 5–15)
BUN: 36 mg/dL — ABNORMAL HIGH (ref 8–23)
CO2: 22 mmol/L (ref 22–32)
Calcium: 9.2 mg/dL (ref 8.9–10.3)
Chloride: 108 mmol/L (ref 98–111)
Creatinine: 1.11 mg/dL — ABNORMAL HIGH (ref 0.44–1.00)
GFR, Estimated: 53 mL/min — ABNORMAL LOW (ref >60)
Glucose, Bld: 243 mg/dL — ABNORMAL HIGH (ref 70–99)
Potassium: 4.3 mmol/L (ref 3.5–5.1)
Sodium: 141 mmol/L (ref 135–145)
Total Bilirubin: 0.4 mg/dL (ref 0.3–1.2)
Total Protein: 7.1 g/dL (ref 6.5–8.1)

## 2020-05-12 LAB — CBC WITH DIFFERENTIAL (CANCER CENTER ONLY)
Abs Immature Granulocytes: 0.03 10*3/uL (ref 0.00–0.07)
Basophils Absolute: 0.1 10*3/uL (ref 0.0–0.1)
Basophils Relative: 1 %
Eosinophils Absolute: 0.4 10*3/uL (ref 0.0–0.5)
Eosinophils Relative: 8 %
HCT: 35 % — ABNORMAL LOW (ref 36.0–46.0)
Hemoglobin: 11.1 g/dL — ABNORMAL LOW (ref 12.0–15.0)
Immature Granulocytes: 1 %
Lymphocytes Relative: 22 %
Lymphs Abs: 1.2 10*3/uL (ref 0.7–4.0)
MCH: 27.1 pg (ref 26.0–34.0)
MCHC: 31.7 g/dL (ref 30.0–36.0)
MCV: 85.6 fL (ref 80.0–100.0)
Monocytes Absolute: 0.6 10*3/uL (ref 0.1–1.0)
Monocytes Relative: 10 %
Neutro Abs: 3.2 10*3/uL (ref 1.7–7.7)
Neutrophils Relative %: 58 %
Platelet Count: 308 10*3/uL (ref 150–400)
RBC: 4.09 MIL/uL (ref 3.87–5.11)
RDW: 14.3 % (ref 11.5–15.5)
WBC Count: 5.5 10*3/uL (ref 4.0–10.5)
nRBC: 0 % (ref 0.0–0.2)

## 2020-05-12 LAB — POCT INR: INR: 2.8 (ref 2.0–3.0)

## 2020-05-12 LAB — TSH: TSH: 0.23 u[IU]/mL — ABNORMAL LOW (ref 0.308–3.960)

## 2020-05-12 MED ORDER — SODIUM CHLORIDE 0.9 % IV SOLN
Freq: Once | INTRAVENOUS | Status: AC
  Filled 2020-05-12: qty 250

## 2020-05-12 MED ORDER — SODIUM CHLORIDE 0.9 % IV SOLN
200.0000 mg | Freq: Once | INTRAVENOUS | Status: AC
  Administered 2020-05-12: 200 mg via INTRAVENOUS
  Filled 2020-05-12: qty 8

## 2020-05-12 NOTE — Patient Instructions (Signed)
Description   Continue taking 1/2 tablet every day except 1 tablet on Mondays, Wednesdays, and Fridays. Recheck 6 weeks. Call our office if you have any questions or if placed on any new medications [phone].

## 2020-05-12 NOTE — Patient Instructions (Signed)
[facility] Discharge Instructions for Patients Receiving Chemotherapy  Today you received the following chemotherapy agents Pembrolizumab  To help prevent nausea and vomiting after your treatment, we encourage you to take your nausea medication as directed.  If you develop nausea and vomiting that is not controlled by your nausea medication, call the clinic.   BELOW ARE SYMPTOMS THAT SHOULD BE REPORTED IMMEDIATELY:  *FEVER GREATER THAN 100.5 F  *CHILLS WITH OR WITHOUT FEVER  NAUSEA AND VOMITING THAT IS NOT CONTROLLED WITH YOUR NAUSEA MEDICATION  *UNUSUAL SHORTNESS OF BREATH  *UNUSUAL BRUISING OR BLEEDING  TENDERNESS IN MOUTH AND THROAT WITH OR WITHOUT PRESENCE OF ULCERS  *URINARY PROBLEMS  *BOWEL PROBLEMS  UNUSUAL RASH Items with * indicate a potential emergency and should be followed up as soon as possible.  Feel free to call the clinic should you have any questions or concerns. The clinic phone number is [phone].  Please show the [place] at check-in to the Emergency Department and triage nurse.  Pembrolizumab injection What is this medicine? PEMBROLIZUMAB (pem broe liz ue mab) is a monoclonal antibody. It is used to treat certain types of cancer. This medicine may be used for other purposes; ask your health care provider or pharmacist if you have questions. COMMON BRAND NAME(S): Keytruda What should I tell my health care provider before I take this medicine? They need to know if you have any of these conditions:  autoimmune diseases like Crohn's disease, ulcerative colitis, or lupus  have had or planning to have an allogeneic stem cell transplant (uses someone else's stem cells)  history of organ transplant  history of chest radiation  nervous system problems like myasthenia gravis or Guillain-Barre syndrome  an unusual or allergic reaction to pembrolizumab, other medicines, foods, dyes, or preservatives  pregnant or trying to get  pregnant  breast-feeding How should I use this medicine? This medicine is for infusion into a vein. It is given by a health care professional in a hospital or clinic setting. A special MedGuide will be given to you before each treatment. Be sure to read this information carefully each time. Talk to your pediatrician regarding the use of this medicine in children. While this drug may be prescribed for children as young as 6 months for selected conditions, precautions do apply. Overdosage: If you think you have taken too much of this medicine contact a poison control center or emergency room at once. NOTE: This medicine is only for you. Do not share this medicine with others. What if I miss a dose? It is important not to miss your dose. Call your doctor or health care professional if you are unable to keep an appointment. What may interact with this medicine? Interactions have not been studied. This list may not describe all possible interactions. Give your health care provider a list of all the medicines, herbs, non-prescription drugs, or dietary supplements you use. Also tell them if you smoke, drink alcohol, or use illegal drugs. Some items may interact with your medicine. What should I watch for while using this medicine? Your condition will be monitored carefully while you are receiving this medicine. You may need blood work done while you are taking this medicine. Do not become pregnant while taking this medicine or for 4 months after stopping it. Women should inform their doctor if they wish to become pregnant or think they might be pregnant. There is a potential for serious side effects to an unborn child. Talk to your  health care professional or pharmacist for more information. Do not breast-feed an infant while taking this medicine or for 4 months after the last dose. What side effects may I notice from receiving this medicine? Side effects that you should report to your doctor or health  care professional as soon as possible:  allergic reactions like skin rash, itching or hives, swelling of the face, lips, or tongue  bloody or black, tarry  breathing problems  changes in vision  chest pain  chills  confusion  constipation  cough  diarrhea  dizziness or feeling faint or lightheaded  fast or irregular heartbeat  fever  flushing  joint pain  low blood counts - this medicine may decrease the number of white blood cells, red blood cells and platelets. You may be at increased risk for infections and bleeding.  muscle pain  muscle weakness  pain, tingling, numbness in the hands or feet  persistent headache  redness, blistering, peeling or loosening of the skin, including inside the mouth  signs and symptoms of high blood sugar such as dizziness; dry mouth; dry skin; fruity breath; nausea; stomach pain; increased hunger or thirst; increased urination  signs and symptoms of kidney injury like trouble passing urine or change in the amount of urine  signs and symptoms of liver injury like dark urine, light-colored stools, loss of appetite, nausea, right upper belly pain, yellowing of the eyes or skin  sweating  swollen lymph nodes  weight loss Side effects that usually do not require medical attention (report to your doctor or health care professional if they continue or are bothersome):  decreased appetite  hair loss  tiredness This list may not describe all possible side effects. Call your doctor for medical advice about side effects. You may report side effects to FDA at 1-800-FDA-1088. Where should I keep my medicine? This drug is given in a hospital or clinic and will not be stored at home. NOTE: This sheet is a summary. It may not cover all possible information. If you have questions about this medicine, talk to your doctor, pharmacist, or health care provider.  2021 Elsevier/Gold Standard (2019-01-01 21:44:53)  

## 2020-05-18 ENCOUNTER — Other Ambulatory Visit: Payer: Self-pay | Admitting: Cardiology

## 2020-05-27 ENCOUNTER — Telehealth: Payer: Self-pay | Admitting: *Deleted

## 2020-05-27 DIAGNOSIS — M48062 Spinal stenosis, lumbar region with neurogenic claudication: Secondary | ICD-10-CM | POA: Diagnosis not present

## 2020-05-27 NOTE — Telephone Encounter (Signed)
   [facility] Pre-operative Risk Assessment    Patient Name: Latasha Hodges  DOB: 04-Nov-1945  MRN: [id number]   [facility] STAFF: - Please ensure there is not already an duplicate clearance open for this procedure. - Under Visit Info/Reason for Call, type in Other and utilize the format Clearance MM/DD/YY or Clearance TBD. Do not use dashes or single digits. - If request is for dental extraction, please clarify the # of teeth to be extracted.  Request for surgical clearance:  1. What type of surgery is being performed? L3-4, L4-5 LUMBAR LAMINECTOMY   2. When is this surgery scheduled? TBD   3. What type of clearance is required (medical clearance vs. Pharmacy clearance to hold med vs. Both)? BOTH  4. Are there any medications that need to be held prior to surgery and how long? WARFARIN   5. Practice name and name of physician performing surgery? [facility]; DR. Eustace Moore   6. What is the office phone number? [phone]   7.   What is the office fax number? [place]: VANESSA  8.   Anesthesia type (None, local, MAC, general) ? GENERAL   Julaine Hua 05/27/2020, 4:59 PM  _________________________________________________________________   (provider comments below)

## 2020-05-28 NOTE — Telephone Encounter (Signed)
Patient with diagnosis of afib on warfarin for anticoagulation.    Procedure: L3-4, L4-5 LUMBAR LAMINECTOMY   Date of procedure: TBD  CHA2DS2-VASc Score = 4  This indicates a 4.8% annual risk of stroke. The patient's score is based upon: CHF History: No HTN History: Yes Diabetes History: Yes Stroke History: No Vascular Disease History: No Age Score: 1 Gender Score: 1  CrCl 32mL/min using adjusted body weight due to obesity Platelet count 308K  Per office protocol, patient can hold warfarin for 5 days prior to procedure.   Patient will NOT need bridging with Lovenx around procedure. Patient should restart warfarin on the evening of procedure or day after, at discretion of procedure MD

## 2020-05-28 NOTE — Telephone Encounter (Signed)
Please comment on warfarin. Thanks! 

## 2020-05-28 NOTE — Telephone Encounter (Signed)
Spoke with patient and she said Dr. Ronnald Ramp wanted warfarin to be held 7 days prior to procedure, however this is not specified. Will call and verify.   Per pharmacy OK to hold warfarin 5 days before procedure without Lovenox bridging.   Pt last seen in December 2021 for cardiac eval for spinal procedure. Recommendations were for Warfarin to be held for 5 days prior to spinal procedure at that time.   Since that visit patient reports she has been doing well from a cardiac standpoint. No chest pain, sob, LLE, orthopna, pnd, palpitations.

## 2020-05-28 NOTE — Telephone Encounter (Signed)
Tried to call Dr. Sherley Bounds office to confirm # of days to hold Warfarin. Office is closed for the holiday weekend. Clearance request did not state how many days. See previous notes where pt stated to pre op provider Dr. Ronnald Ramp said to hold Warfarin x 7 days. Will confirm with requesting office on Monday.

## 2020-05-31 ENCOUNTER — Other Ambulatory Visit: Payer: Self-pay | Admitting: Neurological Surgery

## 2020-05-31 DIAGNOSIS — M48061 Spinal stenosis, lumbar region without neurogenic claudication: Secondary | ICD-10-CM

## 2020-05-31 NOTE — Telephone Encounter (Addendum)
5 day warfarin hold is standard per [facility]'s protocol for any procedure that requires warfarin washout, including spinal procedures. Her INR should drop to ~1 with a 5 day hold. If 7 day hold is required, will need MD input as that will increase the time that pt is subtherapeutic on anticoagulation to closer to 2 weeks by the time her INR increases back to range post-op.

## 2020-05-31 NOTE — Telephone Encounter (Signed)
S/w Latasha Hodges with Dr. Sherley Bounds office. Per Latasha Hodges, Dr. Ronnald Ramp preference is to hold Warfarin x 7 days if at all possible. I assured Latasha Hodges I will send back to pre op provider and PharmD for further advice.

## 2020-06-01 NOTE — Telephone Encounter (Signed)
I left a message for the pt to call the office to go over recommendations for holding her Coumadin. I will fax clearance notes to requesting office.

## 2020-06-01 NOTE — Telephone Encounter (Signed)
Will send to Pharm-D as well

## 2020-06-01 NOTE — Telephone Encounter (Signed)
Clearance already addressed by PharmD.

## 2020-06-01 NOTE — Telephone Encounter (Signed)
    Latasha Hodges DOB:  Jul 14, 1945  MRN:  [id number]   Primary Cardiologist: Fransico Him, MD  Chart reviewed as part of pre-operative protocol coverage. Given past medical history and time since last visit, based on ACC/AHA guidelines, [name] would be at acceptable risk for the planned procedure without further cardiovascular testing.   The patient was advised that if she develops new symptoms prior to surgery to contact our office to arrange for a follow-up visit, and she verbalized understanding.  The case has been reviewed by Dr. [name] who is okay for the patient to hold Coumadin for 7 days prior to the back procedure.  She will need to restart Coumadin as soon as possible after the procedure at the surgeon's discretion.  I will route this recommendation to the requesting party via Epic fax function and remove from pre-op pool.  Please call with questions.  [place], [place] 06/01/2020, 1:29 PM

## 2020-06-02 ENCOUNTER — Inpatient Hospital Stay: Payer: Medicare Other | Admitting: Oncology

## 2020-06-02 ENCOUNTER — Inpatient Hospital Stay: Payer: Medicare Other | Attending: Oncology

## 2020-06-02 ENCOUNTER — Other Ambulatory Visit: Payer: Self-pay

## 2020-06-02 ENCOUNTER — Inpatient Hospital Stay: Payer: Medicare Other

## 2020-06-02 ENCOUNTER — Other Ambulatory Visit (HOSPITAL_COMMUNITY): Payer: Self-pay

## 2020-06-02 VITALS — BP 142/72 | HR 76 | Temp 98.0°F | Resp 19 | Ht 63.0 in | Wt 202.1 lb

## 2020-06-02 DIAGNOSIS — Z905 Acquired absence of kidney: Secondary | ICD-10-CM | POA: Diagnosis not present

## 2020-06-02 DIAGNOSIS — Z7984 Long term (current) use of oral hypoglycemic drugs: Secondary | ICD-10-CM | POA: Diagnosis not present

## 2020-06-02 DIAGNOSIS — Z5112 Encounter for antineoplastic immunotherapy: Secondary | ICD-10-CM | POA: Diagnosis not present

## 2020-06-02 DIAGNOSIS — L309 Dermatitis, unspecified: Secondary | ICD-10-CM | POA: Insufficient documentation

## 2020-06-02 DIAGNOSIS — D49511 Neoplasm of unspecified behavior of right kidney: Secondary | ICD-10-CM

## 2020-06-02 DIAGNOSIS — Z7901 Long term (current) use of anticoagulants: Secondary | ICD-10-CM | POA: Insufficient documentation

## 2020-06-02 DIAGNOSIS — C641 Malignant neoplasm of right kidney, except renal pelvis: Secondary | ICD-10-CM | POA: Insufficient documentation

## 2020-06-02 DIAGNOSIS — L299 Pruritus, unspecified: Secondary | ICD-10-CM | POA: Diagnosis not present

## 2020-06-02 DIAGNOSIS — Z79899 Other long term (current) drug therapy: Secondary | ICD-10-CM | POA: Diagnosis not present

## 2020-06-02 LAB — CMP (CANCER CENTER ONLY)
ALT: 37 U/L (ref 0–44)
AST: 31 U/L (ref 15–41)
Albumin: 3.4 g/dL — ABNORMAL LOW (ref 3.5–5.0)
Alkaline Phosphatase: 80 U/L (ref 38–126)
Anion gap: 11 (ref 5–15)
BUN: 24 mg/dL — ABNORMAL HIGH (ref 8–23)
CO2: 23 mmol/L (ref 22–32)
Calcium: 9.6 mg/dL (ref 8.9–10.3)
Chloride: 107 mmol/L (ref 98–111)
Creatinine: 1.09 mg/dL — ABNORMAL HIGH (ref 0.44–1.00)
GFR, Estimated: 54 mL/min — ABNORMAL LOW (ref >60)
Glucose, Bld: 208 mg/dL — ABNORMAL HIGH (ref 70–99)
Potassium: 4.1 mmol/L (ref 3.5–5.1)
Sodium: 141 mmol/L (ref 135–145)
Total Bilirubin: 0.4 mg/dL (ref 0.3–1.2)
Total Protein: 7.2 g/dL (ref 6.5–8.1)

## 2020-06-02 LAB — CBC WITH DIFFERENTIAL (CANCER CENTER ONLY)
Abs Immature Granulocytes: 0.03 10*3/uL (ref 0.00–0.07)
Basophils Absolute: 0.1 10*3/uL (ref 0.0–0.1)
Basophils Relative: 1 %
Eosinophils Absolute: 0.5 10*3/uL (ref 0.0–0.5)
Eosinophils Relative: 8 %
HCT: 32.4 % — ABNORMAL LOW (ref 36.0–46.0)
Hemoglobin: 10.4 g/dL — ABNORMAL LOW (ref 12.0–15.0)
Immature Granulocytes: 1 %
Lymphocytes Relative: 20 %
Lymphs Abs: 1.3 10*3/uL (ref 0.7–4.0)
MCH: 27.4 pg (ref 26.0–34.0)
MCHC: 32.1 g/dL (ref 30.0–36.0)
MCV: 85.5 fL (ref 80.0–100.0)
Monocytes Absolute: 0.6 10*3/uL (ref 0.1–1.0)
Monocytes Relative: 10 %
Neutro Abs: 4 10*3/uL (ref 1.7–7.7)
Neutrophils Relative %: 60 %
Platelet Count: 346 10*3/uL (ref 150–400)
RBC: 3.79 MIL/uL — ABNORMAL LOW (ref 3.87–5.11)
RDW: 13.4 % (ref 11.5–15.5)
WBC Count: 6.6 10*3/uL (ref 4.0–10.5)
nRBC: 0 % (ref 0.0–0.2)

## 2020-06-02 LAB — TSH: TSH: 1.599 u[IU]/mL (ref 0.308–3.960)

## 2020-06-02 MED ORDER — SODIUM CHLORIDE 0.9 % IV SOLN
Freq: Once | INTRAVENOUS | Status: AC
Start: 2020-06-02 — End: 2020-06-02
  Filled 2020-06-02: qty 250

## 2020-06-02 MED ORDER — DENOSUMAB 120 MG/1.7ML ~~LOC~~ SOLN
SUBCUTANEOUS | Status: AC
  Filled 2020-06-02: qty 1.7

## 2020-06-02 MED ORDER — CLOTRIMAZOLE-BETAMETH & ZN OX 1-0.05 & 20 % EX THPK
PACK | CUTANEOUS | 3 refills | Status: DC
Start: 2020-06-02 — End: 2020-07-15

## 2020-06-02 MED ORDER — SODIUM CHLORIDE 0.9 % IV SOLN
200.0000 mg | Freq: Once | INTRAVENOUS | Status: AC
  Administered 2020-06-02: 200 mg via INTRAVENOUS
  Filled 2020-06-02: qty 8

## 2020-06-02 NOTE — Patient Instructions (Signed)
[facility] Discharge Instructions for Patients Receiving Chemotherapy  Today you received the following chemotherapy agents Pembrolizumab  To help prevent nausea and vomiting after your treatment, we encourage you to take your nausea medication as directed.  If you develop nausea and vomiting that is not controlled by your nausea medication, call the clinic.   BELOW ARE SYMPTOMS THAT SHOULD BE REPORTED IMMEDIATELY:  *FEVER GREATER THAN 100.5 F  *CHILLS WITH OR WITHOUT FEVER  NAUSEA AND VOMITING THAT IS NOT CONTROLLED WITH YOUR NAUSEA MEDICATION  *UNUSUAL SHORTNESS OF BREATH  *UNUSUAL BRUISING OR BLEEDING  TENDERNESS IN MOUTH AND THROAT WITH OR WITHOUT PRESENCE OF ULCERS  *URINARY PROBLEMS  *BOWEL PROBLEMS  UNUSUAL RASH Items with * indicate a potential emergency and should be followed up as soon as possible.  Feel free to call the clinic should you have any questions or concerns. The clinic phone number is [phone].  Please show the [facility] at check-in to the Emergency Department and triage nurse.  Pembrolizumab injection What is this medicine? PEMBROLIZUMAB (pem broe liz ue mab) is a monoclonal antibody. It is used to treat certain types of cancer. This medicine may be used for other purposes; ask your health care provider or pharmacist if you have questions. COMMON BRAND NAME(S): Keytruda What should I tell my health care provider before I take this medicine? They need to know if you have any of these conditions:  autoimmune diseases like Crohn's disease, ulcerative colitis, or lupus  have had or planning to have an allogeneic stem cell transplant (uses someone else's stem cells)  history of organ transplant  history of chest radiation  nervous system problems like myasthenia gravis or Guillain-Barre syndrome  an unusual or allergic reaction to pembrolizumab, other medicines, foods, dyes, or preservatives  pregnant or trying to get  pregnant  breast-feeding How should I use this medicine? This medicine is for infusion into a vein. It is given by a health care professional in a hospital or clinic setting. A special MedGuide will be given to you before each treatment. Be sure to read this information carefully each time. Talk to your pediatrician regarding the use of this medicine in children. While this drug may be prescribed for children as young as 6 months for selected conditions, precautions do apply. Overdosage: If you think you have taken too much of this medicine contact a poison control center or emergency room at once. NOTE: This medicine is only for you. Do not share this medicine with others. What if I miss a dose? It is important not to miss your dose. Call your doctor or health care professional if you are unable to keep an appointment. What may interact with this medicine? Interactions have not been studied. This list may not describe all possible interactions. Give your health care provider a list of all the medicines, herbs, non-prescription drugs, or dietary supplements you use. Also tell them if you smoke, drink alcohol, or use illegal drugs. Some items may interact with your medicine. What should I watch for while using this medicine? Your condition will be monitored carefully while you are receiving this medicine. You may need blood work done while you are taking this medicine. Do not become pregnant while taking this medicine or for 4 months after stopping it. Women should inform their doctor if they wish to become pregnant or think they might be pregnant. There is a potential for serious side effects to an unborn child. Talk to your  health care professional or pharmacist for more information. Do not breast-feed an infant while taking this medicine or for 4 months after the last dose. What side effects may I notice from receiving this medicine? Side effects that you should report to your doctor or health  care professional as soon as possible:  allergic reactions like skin rash, itching or hives, swelling of the face, lips, or tongue  bloody or black, tarry  breathing problems  changes in vision  chest pain  chills  confusion  constipation  cough  diarrhea  dizziness or feeling faint or lightheaded  fast or irregular heartbeat  fever  flushing  joint pain  low blood counts - this medicine may decrease the number of white blood cells, red blood cells and platelets. You may be at increased risk for infections and bleeding.  muscle pain  muscle weakness  pain, tingling, numbness in the hands or feet  persistent headache  redness, blistering, peeling or loosening of the skin, including inside the mouth  signs and symptoms of high blood sugar such as dizziness; dry mouth; dry skin; fruity breath; nausea; stomach pain; increased hunger or thirst; increased urination  signs and symptoms of kidney injury like trouble passing urine or change in the amount of urine  signs and symptoms of liver injury like dark urine, light-colored stools, loss of appetite, nausea, right upper belly pain, yellowing of the eyes or skin  sweating  swollen lymph nodes  weight loss Side effects that usually do not require medical attention (report to your doctor or health care professional if they continue or are bothersome):  decreased appetite  hair loss  tiredness This list may not describe all possible side effects. Call your doctor for medical advice about side effects. You may report side effects to FDA at 1-800-FDA-1088. Where should I keep my medicine? This drug is given in a hospital or clinic and will not be stored at home. NOTE: This sheet is a summary. It may not cover all possible information. If you have questions about this medicine, talk to your doctor, pharmacist, or health care provider.  2021 Elsevier/Gold Standard (2019-01-01 21:44:53)  

## 2020-06-02 NOTE — Telephone Encounter (Signed)
DPR states to only give information to her, the pt. Left message wanted to confirm medication instructions for procedure. Clearance notes have been sent to Dr. Sherley Bounds with recommendations for coumadin.

## 2020-06-02 NOTE — Telephone Encounter (Signed)
Pt has been informed cleared for procedure and as well as ok to hold warfarin x 7 day. Pt thanked me for the call. Pt aware notes have been sent to Dr. Sherley Bounds office.

## 2020-06-02 NOTE — Telephone Encounter (Signed)
Pt returning a call to Arbie Cookey, pt states she is preparing to leave the house, please call her back after 2 hours

## 2020-06-02 NOTE — Progress Notes (Signed)
Hematology and Oncology Follow Up Visit  Latasha Hodges [id number] 07-25-1945 75 y.o. 06/02/2020 12:44 PM Kelton Pillar, MDGriffin, [name], MD   Principle Diagnosis: 79 year old woman with kidney cancer diagnosed with T3aN1 clear-cell histology in December 2021.    Prior Therapy:   She is status post right robotic assisted laparoscopic radical nephrectomy completed by Dr. Alinda Money on January 29, 2020.  The final pathology showed a 7.0 cm clear cell renal cell carcinoma with tumor extends into the renal vein, renal capsule and renal sinus fat indicating T3a disease with 1 lymph node involvement.  No sarcomatoid or rhabdoid features noted with histological grade of G3.  Current therapy: Adjuvant Pembrolizumab 200 mg every 3 weeks started on March 31, 2020.  She is here for cycle 3 of therapy.  Interim History: Ms. Latasha Hodges is here for a follow-up visit.  Since her last visit, she reports a few complaints.  She has reported mild erythematous rash around her navel and was prescribed topical cream by Dr. Lynne [name] office.  She also reported more pruritus on her abdominal skin as well as her lower back.  She had also noticed some erythema and induration at her lower back.  She otherwise reports no other complaints.  She is eating well forms activities of daily living without any decline.     Medications: Unchanged on review. Current Outpatient Medications  Medication Sig Dispense Refill  . Dulaglutide (TRULICITY) 1.5 JK/0.9FG SOPN Inject 1.5 mg into the skin every Sunday.    . fenofibrate micronized (LOFIBRA) 200 MG capsule TAKE 1 CAPSULE BY MOUTH DAILY BEFORE BREAKFAST 90 capsule 2  . flecainide (TAMBOCOR) 50 MG tablet TAKE 1 TABLET(50 MG) BY MOUTH TWICE DAILY (Patient taking differently: Take 50 mg by mouth 2 (two) times daily.) 180 tablet 3  . HYDROcodone-acetaminophen (NORCO/VICODIN) 5-325 MG tablet TAKE 1 TO 2 TABLETS BY MOUTH EVERY 6 HOURS AS NEEDED FOR MODERATE PAIN OR SEVERE PAIN. 20  tablet 0  . lisinopril (ZESTRIL) 20 MG tablet TAKE 1 TABLET(20 MG) BY MOUTH DAILY 90 tablet 3  . magnesium oxide (MAG-OX) 400 MG tablet Take 800 mg by mouth at bedtime.    . metFORMIN (GLUCOPHAGE) 1000 MG tablet Take 1,000 mg by mouth 2 (two) times daily.  0  . metoprolol succinate (TOPROL-XL) 50 MG 24 hr tablet TAKE 1 TABLET(50 MG) BY MOUTH DAILY 90 tablet 3  . prochlorperazine (COMPAZINE) 10 MG tablet Take 1 tablet (10 mg total) by mouth every 6 (six) hours as needed for nausea or vomiting. 30 tablet 0  . simvastatin (ZOCOR) 40 MG tablet TAKE 1 TABLET(40 MG) BY MOUTH DAILY AT 6 PM 90 tablet 3  . warfarin (COUMADIN) 5 MG tablet TAKE BY MOUTH AS DIRECTED EVERY DAY 90 tablet 1   No current facility-administered medications for this visit.     Allergies:  Allergies  Allergen Reactions  . Iodinated Diagnostic Agents Hives, Swelling and Rash  . No Healthtouch Food Allergies     Seafood- rash   . Penicillins Rash    Childhood reaction.  . Sulfa Drugs Cross Reactors Rash    Childhood reaction.  . Theophyllines Rash      Physical Exam: Blood pressure (!) 142/72, pulse 76, temperature 98 F (36.7 C), temperature source Tympanic, resp. rate 19, height 5\' 3"  (1.6 m), weight 202 lb 1.6 oz (91.7 kg), SpO2 97 %.    ECOG: 1   General appearance: Alert, awake without any distress. Head: Atraumatic without abnormalities Oropharynx: Without any thrush or ulcers. Eyes:  No scleral icterus. Lymph nodes: No lymphadenopathy noted in the cervical, supraclavicular, or axillary nodes Heart:regular rate and rhythm, without any murmurs or gallops.   Lung: Clear to auscultation without any rhonchi, wheezes or dullness to percussion. Abdomin: Soft, nontender without any shifting dullness or ascites. Musculoskeletal: No clubbing or cyanosis. Neurological: No motor or sensory deficits. Skin: Mild erythema noted around her navel as well as raised induration noted at her lower back.      Lab  Results: Lab Results  Component Value Date   WBC 5.5 05/12/2020   HGB 11.1 (L) 05/12/2020   HCT 35.0 (L) 05/12/2020   MCV 85.6 05/12/2020   PLT 308 05/12/2020     Chemistry      Component Value Date/Time   NA 141 05/12/2020 1151   NA 139 08/20/2017 1147   K 4.3 05/12/2020 1151   CL 108 05/12/2020 1151   CO2 22 05/12/2020 1151   BUN 36 (H) 05/12/2020 1151   BUN 18 08/20/2017 1147   CREATININE 1.11 (H) 05/12/2020 1151      Component Value Date/Time   CALCIUM 9.2 05/12/2020 1151   ALKPHOS 75 05/12/2020 1151   AST 51 (H) 05/12/2020 1151   ALT 58 (H) 05/12/2020 1151   BILITOT 0.4 05/12/2020 1151          Impression and Plan:   75 year old woman with:  1.    Clear-cell renal cell carcinoma diagnosed in December 2021.  She was found to have T3a N1 right tumor after nephrectomy.  Her disease status was updated at this time and treatment options were reviewed.  She is currently on Pembrolizumab which she has tolerated very well without any complaints.  Risks and benefits of continuing this treatment to complete a year of therapy were discussed.  Complications include dermatitis, rash and immune mediated complications were reviewed.  She is agreeable to proceed and the plan is to update her staging scans in June 2022.    2.  IV access: No issues reported at this time without peripheral veins.  3.  Antiemetics: Compazine is available to her without any nausea or vomiting.  4.  Immune mediated complications: She has not reported any issues at this time.  Continue to educate her about potential issues such as pneumonitis, colitis and thyroid disease.  She is experiencing some mild mild dermatitis which we will treat with topical creams.   5.  Follow-up: In 3 weeks for repeat follow-up.  30  minutes were dedicated to this visit.  The time was spent on reviewing her disease status, reviewing laboratory data, treatment options and addressing complications related to her  cancer and cancer therapy.  Zola Button, MD 4/20/202212:44 PM

## 2020-06-04 ENCOUNTER — Telehealth: Payer: Self-pay | Admitting: *Deleted

## 2020-06-04 ENCOUNTER — Other Ambulatory Visit: Payer: Self-pay | Admitting: Medical

## 2020-06-04 MED ORDER — CLOTRIMAZOLE-BETAMETHASONE 1-0.05 % EX CREA: 1.0000 "application " | TOPICAL_CREAM | Freq: Two times a day (BID) | CUTANEOUS | 0 refills | Status: DC

## 2020-06-04 NOTE — Telephone Encounter (Signed)
Received phone call from patient, she was prescribed a combination cream by Dr. Alen Blew but it is unavailable at her pharmacy - [facility].  RN called other pharmacies, none have the cream.  RN contacted V. Tanner PA, he is sending in lotrisone prescription, advises that patient buy zinc oxide over the counter to use in combination with the lotrisone.  PC to patient informing her of this plan, she verbalizes understanding.

## 2020-06-16 DIAGNOSIS — E782 Mixed hyperlipidemia: Secondary | ICD-10-CM | POA: Diagnosis not present

## 2020-06-16 DIAGNOSIS — C641 Malignant neoplasm of right kidney, except renal pelvis: Secondary | ICD-10-CM | POA: Diagnosis not present

## 2020-06-16 DIAGNOSIS — D6869 Other thrombophilia: Secondary | ICD-10-CM | POA: Diagnosis not present

## 2020-06-16 DIAGNOSIS — E1165 Type 2 diabetes mellitus with hyperglycemia: Secondary | ICD-10-CM | POA: Diagnosis not present

## 2020-06-16 DIAGNOSIS — J45909 Unspecified asthma, uncomplicated: Secondary | ICD-10-CM | POA: Diagnosis not present

## 2020-06-16 DIAGNOSIS — I119 Hypertensive heart disease without heart failure: Secondary | ICD-10-CM | POA: Diagnosis not present

## 2020-06-16 DIAGNOSIS — I4891 Unspecified atrial fibrillation: Secondary | ICD-10-CM | POA: Diagnosis not present

## 2020-06-23 ENCOUNTER — Other Ambulatory Visit: Payer: Self-pay

## 2020-06-23 ENCOUNTER — Other Ambulatory Visit: Payer: Self-pay | Admitting: Oncology

## 2020-06-23 ENCOUNTER — Inpatient Hospital Stay: Payer: Medicare Other

## 2020-06-23 ENCOUNTER — Inpatient Hospital Stay: Payer: Medicare Other | Attending: Oncology

## 2020-06-23 ENCOUNTER — Inpatient Hospital Stay (HOSPITAL_BASED_OUTPATIENT_CLINIC_OR_DEPARTMENT_OTHER): Payer: Medicare Other | Admitting: Oncology

## 2020-06-23 VITALS — BP 149/56 | HR 71 | Temp 97.9°F | Resp 16 | Ht 63.0 in | Wt 201.5 lb

## 2020-06-23 DIAGNOSIS — Z79899 Other long term (current) drug therapy: Secondary | ICD-10-CM | POA: Insufficient documentation

## 2020-06-23 DIAGNOSIS — Z7901 Long term (current) use of anticoagulants: Secondary | ICD-10-CM | POA: Insufficient documentation

## 2020-06-23 DIAGNOSIS — Z5112 Encounter for antineoplastic immunotherapy: Secondary | ICD-10-CM | POA: Diagnosis not present

## 2020-06-23 DIAGNOSIS — Z905 Acquired absence of kidney: Secondary | ICD-10-CM | POA: Diagnosis not present

## 2020-06-23 DIAGNOSIS — Z7984 Long term (current) use of oral hypoglycemic drugs: Secondary | ICD-10-CM | POA: Insufficient documentation

## 2020-06-23 DIAGNOSIS — D49511 Neoplasm of unspecified behavior of right kidney: Secondary | ICD-10-CM | POA: Diagnosis not present

## 2020-06-23 DIAGNOSIS — C641 Malignant neoplasm of right kidney, except renal pelvis: Secondary | ICD-10-CM | POA: Diagnosis not present

## 2020-06-23 LAB — CBC WITH DIFFERENTIAL (CANCER CENTER ONLY)
Abs Immature Granulocytes: 0.04 10*3/uL (ref 0.00–0.07)
Basophils Absolute: 0 10*3/uL (ref 0.0–0.1)
Basophils Relative: 1 %
Eosinophils Absolute: 0.4 10*3/uL (ref 0.0–0.5)
Eosinophils Relative: 5 %
HCT: 32.4 % — ABNORMAL LOW (ref 36.0–46.0)
Hemoglobin: 10.5 g/dL — ABNORMAL LOW (ref 12.0–15.0)
Immature Granulocytes: 1 %
Lymphocytes Relative: 23 %
Lymphs Abs: 1.5 10*3/uL (ref 0.7–4.0)
MCH: 27.6 pg (ref 26.0–34.0)
MCHC: 32.4 g/dL (ref 30.0–36.0)
MCV: 85 fL (ref 80.0–100.0)
Monocytes Absolute: 0.7 10*3/uL (ref 0.1–1.0)
Monocytes Relative: 10 %
Neutro Abs: 4 10*3/uL (ref 1.7–7.7)
Neutrophils Relative %: 60 %
Platelet Count: 324 10*3/uL (ref 150–400)
RBC: 3.81 MIL/uL — ABNORMAL LOW (ref 3.87–5.11)
RDW: 13.3 % (ref 11.5–15.5)
WBC Count: 6.6 10*3/uL (ref 4.0–10.5)
nRBC: 0 % (ref 0.0–0.2)

## 2020-06-23 LAB — CMP (CANCER CENTER ONLY)
ALT: 25 U/L (ref 0–44)
AST: 26 U/L (ref 15–41)
Albumin: 3.2 g/dL — ABNORMAL LOW (ref 3.5–5.0)
Alkaline Phosphatase: 83 U/L (ref 38–126)
Anion gap: 9 (ref 5–15)
BUN: 26 mg/dL — ABNORMAL HIGH (ref 8–23)
CO2: 25 mmol/L (ref 22–32)
Calcium: 9.2 mg/dL (ref 8.9–10.3)
Chloride: 106 mmol/L (ref 98–111)
Creatinine: 1.1 mg/dL — ABNORMAL HIGH (ref 0.44–1.00)
GFR, Estimated: 53 mL/min — ABNORMAL LOW (ref >60)
Glucose, Bld: 183 mg/dL — ABNORMAL HIGH (ref 70–99)
Potassium: 3.9 mmol/L (ref 3.5–5.1)
Sodium: 140 mmol/L (ref 135–145)
Total Bilirubin: 0.3 mg/dL (ref 0.3–1.2)
Total Protein: 7 g/dL (ref 6.5–8.1)

## 2020-06-23 LAB — TSH: TSH: 12.502 u[IU]/mL — ABNORMAL HIGH (ref 0.308–3.960)

## 2020-06-23 MED ORDER — SODIUM CHLORIDE 0.9 % IV SOLN
400.0000 mg | Freq: Once | INTRAVENOUS | Status: AC
  Administered 2020-06-23: 400 mg via INTRAVENOUS
  Filled 2020-06-23: qty 16

## 2020-06-23 MED ORDER — SODIUM CHLORIDE 0.9 % IV SOLN
Freq: Once | INTRAVENOUS | Status: AC
Start: 2020-06-23 — End: 2020-06-23
  Filled 2020-06-23: qty 250

## 2020-06-23 NOTE — Progress Notes (Addendum)
Hematology and Oncology Follow Up Visit  Latasha Hodges [id number] 1945/04/24 75 y.o. 06/23/2020 12:31 PM Latasha Hodges, MDGriffin, Latasha Sheffield, MD   Principle Diagnosis: 76 year old woman with T3aN1 clear-cell renal cell carcinoma diagnosed in December 2021.    Prior Therapy:   She is status post right robotic assisted laparoscopic radical nephrectomy completed by Dr. Alinda Money on January 29, 2020.  The final pathology showed a 7.0 cm clear cell renal cell carcinoma with tumor extends into the renal vein, renal capsule and renal sinus fat indicating T3a disease with 1 lymph node involvement.  No sarcomatoid or rhabdoid features noted with histological grade of G3.  Current therapy:  Pembrolizumab 200 mg every 3 weeks started on March 31, 2020.  She is here for cycle 5 of treatment.  Interim History: [name] presents today for a return evaluation.  Since the last visit, she reports no major changes in her health.  She continues to tolerate Pembrolizumab without any major complications.  She does report some dry mouth and irritation around her lips but no skin changes.  She denies any recent hospitalization or illnesses.  She denies any GI toxicities.  She does report chronic back pain and scheduled to have back surgery in the near future.    Medications: Unchanged on review. Current Outpatient Medications  Medication Sig Dispense Refill  . Clotrimazole-Betameth & Zn Ox 1-0.05 & 20 % THPK Apply to affected area twice a day. 135 g 3  . clotrimazole-betamethasone (LOTRISONE) cream Apply 1 application topically 2 (two) times daily. May use with OTC zinc oxide cream. 135 g 0  . Dulaglutide (TRULICITY) 1.5 TO/6.7TI SOPN Inject 1.5 mg into the skin every Sunday.    . fenofibrate micronized (LOFIBRA) 200 MG capsule TAKE 1 CAPSULE BY MOUTH DAILY BEFORE BREAKFAST 90 capsule 2  . flecainide (TAMBOCOR) 50 MG tablet TAKE 1 TABLET(50 MG) BY MOUTH TWICE DAILY (Patient taking differently: Take 50 mg  by mouth 2 (two) times daily.) 180 tablet 3  . HYDROcodone-acetaminophen (NORCO/VICODIN) 5-325 MG tablet TAKE 1 TO 2 TABLETS BY MOUTH EVERY 6 HOURS AS NEEDED FOR MODERATE PAIN OR SEVERE PAIN. 20 tablet 0  . lisinopril (ZESTRIL) 20 MG tablet TAKE 1 TABLET(20 MG) BY MOUTH DAILY 90 tablet 3  . magnesium oxide (MAG-OX) 400 MG tablet Take 800 mg by mouth at bedtime.    . metFORMIN (GLUCOPHAGE) 1000 MG tablet Take 1,000 mg by mouth 2 (two) times daily.  0  . metoprolol succinate (TOPROL-XL) 50 MG 24 hr tablet TAKE 1 TABLET(50 MG) BY MOUTH DAILY 90 tablet 3  . prochlorperazine (COMPAZINE) 10 MG tablet Take 1 tablet (10 mg total) by mouth every 6 (six) hours as needed for nausea or vomiting. 30 tablet 0  . simvastatin (ZOCOR) 40 MG tablet TAKE 1 TABLET(40 MG) BY MOUTH DAILY AT 6 PM 90 tablet 3  . warfarin (COUMADIN) 5 MG tablet TAKE BY MOUTH AS DIRECTED EVERY DAY 90 tablet 1   No current facility-administered medications for this visit.     Allergies:  Allergies  Allergen Reactions  . Iodinated Diagnostic Agents Hives, Swelling and Rash  . No Healthtouch Food Allergies     Seafood- rash   . Penicillins Rash    Childhood reaction.  . Sulfa Drugs Cross Reactors Rash    Childhood reaction.  . Theophyllines Rash      Physical Exam:  Blood pressure (!) 149/56, pulse 71, temperature 97.9 F (36.6 C), temperature source Tympanic, resp. rate 16, height 5\' 3"  (1.6 m),  weight 201 lb 8 oz (91.4 kg), SpO2 100 %.    ECOG: 1    General appearance: Comfortable appearing without any discomfort Head: Normocephalic without any trauma Oropharynx: Mucous membranes are moist and pink without any thrush or ulcers. Eyes: Pupils are equal and round reactive to light. Lymph nodes: No cervical, supraclavicular, inguinal or axillary lymphadenopathy.   Heart:regular rate and rhythm.  S1 and S2 without leg edema. Lung: Clear without any rhonchi or wheezes.  No dullness to percussion. Abdomin: Soft,  nontender, nondistended with good bowel sounds.  No hepatosplenomegaly. Musculoskeletal: No joint deformity or effusion.  Full range of motion noted. Neurological: No deficits noted on motor, sensory and deep tendon reflex exam. Skin: No petechial rash or dryness.  Appeared moist.        Lab Results: Lab Results  Component Value Date   WBC 6.6 06/02/2020   HGB 10.4 (L) 06/02/2020   HCT 32.4 (L) 06/02/2020   MCV 85.5 06/02/2020   PLT 346 06/02/2020     Chemistry      Component Value Date/Time   NA 141 06/02/2020 1233   NA 139 08/20/2017 1147   K 4.1 06/02/2020 1233   CL 107 06/02/2020 1233   CO2 23 06/02/2020 1233   BUN 24 (H) 06/02/2020 1233   BUN 18 08/20/2017 1147   CREATININE 1.09 (H) 06/02/2020 1233      Component Value Date/Time   CALCIUM 9.6 06/02/2020 1233   ALKPHOS 80 06/02/2020 1233   AST 31 06/02/2020 1233   ALT 37 06/02/2020 1233   BILITOT 0.4 06/02/2020 1233          Impression and Plan:   75 year old woman with:  1.   T3a N1 clear cell renal cell carcinoma arising from the right kidney.  She is status post surgical resection currently receiving adjuvant Pembrolizumab therapy.  Risks and benefits of continuing this treatment were discussed at this time.  Potential complications including GI toxicity, dermatological issues and autoimmune complications.  Alternative options would be active surveillance.  He is agreeable to continue at this time.  He is considering the 400 mg every 6-week schedule option.      2.  IV access: Peripheral veins currently in use without any issues.  3.  Antiemetics: No nausea or vomiting reported at this time.  Compazine is available to her.  4.  Immune mediated complications: Complications such as pneumonitis, colitis, hypophysitis and hepatitis were reiterated.  She is not experiencing any at this time.  5.  Upcoming laminectomy surgery: This is scheduled for May 27 and no objection from an oncology standpoint.   Future treatment might need to be rescheduled pending her recovery.   6.  Follow-up: In 3 weeks tentatively pending her surgery and recovery.  30  minutes were spent on this visit.  The time was dedicated to reviewing disease status, discussing treatment options and complications related to her cancer and cancer therapy.  Zola Button, MD 5/11/202212:31 PM   Patient agreed to proceed with 400 mg today of Pembrolizumab which will be repeated every 6 weeks.  Complication associated with this dose was discussed at this time and for the most part has not been any different than the 200 mg dosing.  Zola Button MD 06/23/2020

## 2020-06-23 NOTE — Addendum Note (Signed)
Addended by: Wyatt Portela on: 06/23/2020 01:39 PM   Modules accepted: Orders

## 2020-06-23 NOTE — Patient Instructions (Signed)
[facility] MEDICAL ONCOLOGY  Discharge Instructions: Thank you for choosing [facility] to provide your oncology and hematology care.   If you have a lab appointment with the Cancer Center, please go directly to the Cancer Center and check in at the registration area.   Wear comfortable clothing and clothing appropriate for easy access to any Portacath or PICC line.   We strive to give you quality time with your provider. You may need to reschedule your appointment if you arrive late (15 or more minutes).  Arriving late affects you and other patients whose appointments are after yours.  Also, if you miss three or more appointments without notifying the office, you may be dismissed from the clinic at the provider's discretion.      For prescription refill requests, have your pharmacy contact our office and allow 72 hours for refills to be completed.    Today you received the following chemotherapy and/or immunotherapy agents: keytruda      To help prevent nausea and vomiting after your treatment, we encourage you to take your nausea medication as directed.  BELOW ARE SYMPTOMS THAT SHOULD BE REPORTED IMMEDIATELY: *FEVER GREATER THAN 100.4 F (38 C) OR HIGHER *CHILLS OR SWEATING *NAUSEA AND VOMITING THAT IS NOT CONTROLLED WITH YOUR NAUSEA MEDICATION *UNUSUAL SHORTNESS OF BREATH *UNUSUAL BRUISING OR BLEEDING *URINARY PROBLEMS (pain or burning when urinating, or frequent urination) *BOWEL PROBLEMS (unusual diarrhea, constipation, pain near the anus) TENDERNESS IN MOUTH AND THROAT WITH OR WITHOUT PRESENCE OF ULCERS (sore throat, sores in mouth, or a toothache) UNUSUAL RASH, SWELLING OR PAIN  UNUSUAL VAGINAL DISCHARGE OR ITCHING   Items with * indicate a potential emergency and should be followed up as soon as possible or go to the Emergency Department if any problems should occur.  Please show the CHEMOTHERAPY ALERT CARD or IMMUNOTHERAPY ALERT CARD at check-in to  the Emergency Department and triage nurse.  Should you have questions after your visit or need to cancel or reschedule your appointment, please contact [facility]  Dept: [phone]  and follow the prompts.  Office hours are 8:00 a.m. to 4:30 p.m. Monday - Friday. Please note that voicemails left after 4:00 p.m. may not be returned until the following business day.  We are closed weekends and major holidays. You have access to a nurse at all times for urgent questions. Please call the main number to the clinic Dept: [phone] and follow the prompts.   For any non-urgent questions, you may also contact your provider using MyChart. We now offer e-Visits for anyone 18 and older to request care online for non-urgent symptoms. For details visit [email or web address].   Also download the MyChart app! Go to the app store, search "MyChart", open the app, select [facility], and log in with your MyChart username and password.  Due to Covid, a mask is required upon entering the hospital/clinic. If you do not have a mask, one will be given to you upon arrival. For doctor visits, patients may have 1 support person aged 18 or older with them. For treatment visits, patients cannot have anyone with them due to current Covid guidelines and our immunocompromised population.   

## 2020-06-24 ENCOUNTER — Encounter: Payer: Self-pay | Admitting: *Deleted

## 2020-06-24 ENCOUNTER — Ambulatory Visit (INDEPENDENT_AMBULATORY_CARE_PROVIDER_SITE_OTHER): Payer: Medicare Other | Admitting: *Deleted

## 2020-06-24 ENCOUNTER — Telehealth: Payer: Self-pay | Admitting: *Deleted

## 2020-06-24 DIAGNOSIS — I480 Paroxysmal atrial fibrillation: Secondary | ICD-10-CM

## 2020-06-24 DIAGNOSIS — Z5181 Encounter for therapeutic drug level monitoring: Secondary | ICD-10-CM

## 2020-06-24 LAB — POCT INR: INR: 1.8 — AB (ref 2.0–3.0)

## 2020-06-24 NOTE — Patient Instructions (Addendum)
Description   -Take 1 tablet of warfarin today -Then continue taking 1/2 tablet every day except 1 tablet on Mondays, Wednesdays, and Fridays.  -On 5/19-last dose of warfarin. 5/20-5/26-HOLD WARFARIN.  -On 5/27- evening of procedure, resume normal dose of warfarin or when Dr says it is safe to resume.  -Recheck INR 1 week post procedure.  Call our office if you have any questions or if placed on any new medications [phone].

## 2020-06-24 NOTE — Telephone Encounter (Signed)
Pt called and stated that her procedure date was changed to 6/1 and was wondering what her new instructions for Warfarin would be. Informed pt that I would send her a my chart message with the updated instructions. Rescheduled pt's INR check for 6/8.

## 2020-06-30 ENCOUNTER — Telehealth: Payer: Self-pay | Admitting: *Deleted

## 2020-06-30 ENCOUNTER — Encounter: Payer: Self-pay | Admitting: Oncology

## 2020-06-30 NOTE — Telephone Encounter (Signed)
-----   Message from Wyatt Portela, MD sent at 06/30/2020 11:35 AM EDT ----- Not sure who else can help. Will waite for [name]. Thanks ----- Message ----- From: Rolene Course, RN Sent: 06/30/2020  11:34 AM EDT To: Wyatt Portela, MD  I received a phone call & MyChart message from this patient, she states that last time she spoke with you during an appointment she discussed her medical bills for [facility] that she has been receiving.  She said that you informed her you would contact someone, that she should not be receiving these bills.  I sent a message to Stefanie Libel (haven't gotten a response so far) & informed the patient of this.  The patient thinks you told her about someone else.  Do you know who needs to be contacted about this?  Please advise, thanks Bethena Roys

## 2020-06-30 NOTE — Telephone Encounter (Signed)
PC to patient, informed her that Latasha Hodges has been contacted about her financial concerns.  Patient states she has spoken with [name] before & that she has her number, instructed patient to call [name] to see how to proceed.  Patient verbalizes understanding.

## 2020-06-30 NOTE — Progress Notes (Signed)
Called patient referred by RN regarding billing for [facility]. Based on patient insurance type and dates of service, the program that would possibly be of assistance only retros 30 days if approved. Patient's dates of service in concern were further back than 30 days. Explained to patient.  Reached out to Elta Guadeloupe in pharmacy via in-basket to see if patient assistance may be available due to excessive OOP and to reach out to patient should there be any assistance.  Advised patient and provided Laura's contact information per her request.  Provided her the [employer] grant balance which she can continue to submit personal expenses to help free up her personal money.  She has my card for any additional financial questions or concerns.

## 2020-07-06 NOTE — Pre-Procedure Instructions (Signed)
Surgical Instructions    Your procedure is scheduled on June 1st 2022.   Report to [facility]tr[facility] "A" at 12:00 PM., then check in with the Admitting office.  Call this number if you have problems the morning of surgery:  [phone]   If you have any questions prior to your surgery date call [phone]: Open Monday-Friday 8am-4pm    Remember:  Do not eat or drink after midnight the night before your surgery     Take these medicines the morning of surgery with A SIP OF WATER   fenofibrate micronized (LOFIBRA)  metoprolol succinate (TOPROL-XL)  prochlorperazine (COMPAZINE)- If needed  simvastatin (ZOCOR)  flecainide (TAMBOCOR)  Please contact your physician regarding instructions on stopping your Coumadin.     WHAT DO I DO ABOUT MY DIABETES MEDICATION?   Marland Kitchen Do not take oral diabetes medicines (pills) the morning of surgery. Do not take metFORMIN (GLUCOPHAGE) the day before your surgery or the morning of your surgery.    HOW TO MANAGE YOUR DIABETES BEFORE AND AFTER SURGERY  Why is it important to control my blood sugar before and after surgery? . Improving blood sugar levels before and after surgery helps healing and can limit problems. . A way of improving blood sugar control is eating a healthy diet by: o  Eating less sugar and carbohydrates o  Increasing activity/exercise o  Talking with your doctor about reaching your blood sugar goals . High blood sugars (greater than 180 mg/dL) can raise your risk of infections and slow your recovery, so you will need to focus on controlling your diabetes during the weeks before surgery. . Make sure that the doctor who takes care of your diabetes knows about your planned surgery including the date and location.  How do I manage my blood sugar before surgery? . Check your blood sugar at least 4 times a day, starting 2 days before surgery, to make sure that the level is not too high or low.  . Check your blood sugar the  morning of your surgery when you wake up and every 2 hours until you get to the Short Stay unit.  o If your blood sugar is less than 70 mg/dL, you will need to treat for low blood sugar: - Do not take insulin. - Treat a low blood sugar (less than 70 mg/dL) with  cup of clear juice (cranberry or apple), 4 glucose tablets, OR glucose gel. - Recheck blood sugar in 15 minutes after treatment (to make sure it is greater than 70 mg/dL). If your blood sugar is not greater than 70 mg/dL on recheck, call [phone] for further instructions. . Report your blood sugar to the short stay nurse when you get to Short Stay.  . If you are admitted to the hospital after surgery: o Your blood sugar will be checked by the staff and you will probably be given insulin after surgery (instead of oral diabetes medicines) to make sure you have good blood sugar levels. o The goal for blood sugar control after surgery is 80-180 mg/dL.   As of today, STOP taking any Aspirin (unless otherwise instructed by your surgeon) Aleve, Naproxen, Ibuprofen, Motrin, Advil, Goody's, BC's, all herbal medications, fish oil, and all vitamins.                     Do NOT Smoke (Tobacco/Vaping) or drink Alcohol 24 hours prior to your procedure.  If you use a CPAP at night, you may bring all  equipment for your overnight stay.   Contacts, glasses, piercing's, hearing aid's, dentures or partials may not be worn into surgery, please bring cases for these belongings.    For patients admitted to the hospital, discharge time will be determined by your treatment team.   Patients discharged the day of surgery will not be allowed to drive home, and someone needs to stay with them for 24 hours.    Special instructions:   [facility] [facility]- Preparing For Surgery  Before surgery, you can play an important role. Because skin is not sterile, your skin needs to be as free of germs as possible. You can reduce the number of germs on your skin by washing  with CHG (chlorahexidine gluconate) Soap before surgery.  CHG is an antiseptic cleaner which kills germs and bonds with the skin to continue killing germs even after washing.    Oral Hygiene is also important to reduce your risk of infection.  Remember - BRUSH YOUR TEETH THE MORNING OF SURGERY WITH YOUR REGULAR TOOTHPASTE  Please do not use if you have an allergy to CHG or antibacterial soaps. If your skin becomes reddened/irritated stop using the CHG.  Do not shave (including legs and underarms) for at least 48 hours prior to first CHG shower. It is OK to shave your face.  Please follow these instructions carefully.   1. Shower the NIGHT BEFORE SURGERY and the MORNING OF SURGERY  2. If you chose to wash your hair, wash your hair first as usual with your normal shampoo.  3. After you shampoo, rinse your hair and body thoroughly to remove the shampoo.  4. Use CHG Soap as you would any other liquid soap. You can apply CHG directly to the skin and wash gently with a scrungie or a clean washcloth.   5. Apply the CHG Soap to your body ONLY FROM THE NECK DOWN.  Do not use on open wounds or open sores. Avoid contact with your eyes, ears, mouth and genitals (private parts). Wash Face and genitals (private parts)  with your normal soap.   6. Wash thoroughly, paying special attention to the area where your surgery will be performed.  7. Thoroughly rinse your body with warm water from the neck down.  8. DO NOT shower/wash with your normal soap after using and rinsing off the CHG Soap.  9. Pat yourself dry with a CLEAN TOWEL.  10. Wear CLEAN PAJAMAS to bed the night before surgery  11. Place CLEAN SHEETS on your bed the night before your surgery  12. DO NOT SLEEP WITH PETS.   Day of Surgery: Shower with CHG soap. Do not wear jewelry, make up, or nail polish Do not wear lotions, powders, perfumes/colognes, or deodorant. Do not shave 48 hours prior to surgery.   Do not bring valuables to the  hospital. [facility] is not responsible for any belongings or valuables. Wear Clean/Comfortable clothing the morning of surgery Remember to brush your teeth WITH YOUR REGULAR TOOTHPASTE.   Please read over the following fact sheets that you were given.

## 2020-07-07 ENCOUNTER — Other Ambulatory Visit: Payer: Self-pay | Admitting: Student

## 2020-07-07 ENCOUNTER — Other Ambulatory Visit: Payer: Self-pay

## 2020-07-07 ENCOUNTER — Encounter (HOSPITAL_COMMUNITY)
Admission: RE | Admit: 2020-07-07 | Discharge: 2020-07-07 | Disposition: A | Discharge status: Home / Self Care | Payer: Medicare Other | Source: Ambulatory Visit | Attending: Neurological Surgery | Admitting: Neurological Surgery

## 2020-07-07 ENCOUNTER — Ambulatory Visit (HOSPITAL_COMMUNITY)
Admission: RE | Admit: 2020-07-07 | Discharge: 2020-07-07 | Disposition: A | Discharge status: Home / Self Care | Payer: Medicare Other | Source: Ambulatory Visit | Attending: Neurological Surgery | Admitting: Neurological Surgery

## 2020-07-07 ENCOUNTER — Encounter (HOSPITAL_COMMUNITY): Payer: Self-pay

## 2020-07-07 DIAGNOSIS — Z01818 Encounter for other preprocedural examination: Secondary | ICD-10-CM | POA: Diagnosis not present

## 2020-07-07 DIAGNOSIS — Z20822 Contact with and (suspected) exposure to covid-19: Secondary | ICD-10-CM | POA: Diagnosis not present

## 2020-07-07 DIAGNOSIS — M48061 Spinal stenosis, lumbar region without neurogenic claudication: Secondary | ICD-10-CM | POA: Diagnosis not present

## 2020-07-07 HISTORY — DX: Malignant neoplasm of right kidney, except renal pelvis: C64.1

## 2020-07-07 LAB — BASIC METABOLIC PANEL
Anion gap: 8 (ref 5–15)
BUN: 22 mg/dL (ref 8–23)
CO2: 25 mmol/L (ref 22–32)
Calcium: 9.2 mg/dL (ref 8.9–10.3)
Chloride: 101 mmol/L (ref 98–111)
Creatinine, Ser: 1.06 mg/dL — ABNORMAL HIGH (ref 0.44–1.00)
GFR, Estimated: 55 mL/min — ABNORMAL LOW (ref >60)
Glucose, Bld: 207 mg/dL — ABNORMAL HIGH (ref 70–99)
Potassium: 3.9 mmol/L (ref 3.5–5.1)
Sodium: 134 mmol/L — ABNORMAL LOW (ref 135–145)

## 2020-07-07 LAB — PROTIME-INR
INR: 1.8 — ABNORMAL HIGH (ref 0.8–1.2)
Prothrombin Time: 20.7 seconds — ABNORMAL HIGH (ref 11.4–15.2)

## 2020-07-07 LAB — SARS CORONAVIRUS 2 BY RT PCR (HOSPITAL ORDER, PERFORMED IN ~~LOC~~ HOSPITAL LAB): SARS Coronavirus 2: NEGATIVE

## 2020-07-07 LAB — CBC WITH DIFFERENTIAL/PLATELET
Abs Immature Granulocytes: 0.06 10*3/uL (ref 0.00–0.07)
Basophils Absolute: 0.1 10*3/uL (ref 0.0–0.1)
Basophils Relative: 1 %
Eosinophils Absolute: 0.4 10*3/uL (ref 0.0–0.5)
Eosinophils Relative: 6 %
HCT: 33 % — ABNORMAL LOW (ref 36.0–46.0)
Hemoglobin: 10.3 g/dL — ABNORMAL LOW (ref 12.0–15.0)
Immature Granulocytes: 1 %
Lymphocytes Relative: 23 %
Lymphs Abs: 1.6 10*3/uL (ref 0.7–4.0)
MCH: 27.6 pg (ref 26.0–34.0)
MCHC: 31.2 g/dL (ref 30.0–36.0)
MCV: 88.5 fL (ref 80.0–100.0)
Monocytes Absolute: 0.7 10*3/uL (ref 0.1–1.0)
Monocytes Relative: 10 %
Neutro Abs: 4.3 10*3/uL (ref 1.7–7.7)
Neutrophils Relative %: 59 %
Platelets: 371 10*3/uL (ref 150–400)
RBC: 3.73 MIL/uL — ABNORMAL LOW (ref 3.87–5.11)
RDW: 13.4 % (ref 11.5–15.5)
WBC: 7.1 10*3/uL (ref 4.0–10.5)
nRBC: 0 % (ref 0.0–0.2)

## 2020-07-07 LAB — SURGICAL PCR SCREEN
MRSA, PCR: NEGATIVE
Staphylococcus aureus: NEGATIVE

## 2020-07-07 LAB — GLUCOSE, CAPILLARY: Glucose-Capillary: 215 mg/dL — ABNORMAL HIGH (ref 70–99)

## 2020-07-07 NOTE — Progress Notes (Signed)
PCP - Kelton Pillar  Cardiologist - Tuner  Chest x-ray - 07/07/20 EKG - 07/07/20 Stress Test - 2019 ECHO -  Cardiac Cath -    Fasting Blood Sugar - 160s Checks Blood Sugar __1___ times a week  Blood Thinner Instructions: Coumadin 07/06/20 Aspirin Instructions:none   COVID TEST- [place] Pending, patient stated upon arrive that her grand daugher just tested positive.  Patient denies having any symptoms. Spoke with Lindsi Flow Coordinator for permission to run [place] today since patient is scheduled to have a port placed with [facility] IR tomorrow.   Anesthesia review: yes  Patient denies shortness of breath, fever, cough and chest pain at PAT appointment   All instructions explained to the patient, with a verbal understanding of the material. Patient agrees to go over the instructions while at home for a better understanding. Patient also instructed to self quarantine after being tested for COVID-19. The opportunity to ask questions was provided.

## 2020-07-08 ENCOUNTER — Other Ambulatory Visit: Payer: Self-pay | Admitting: Oncology

## 2020-07-08 ENCOUNTER — Ambulatory Visit (HOSPITAL_COMMUNITY)
Admission: RE | Admit: 2020-07-08 | Discharge: 2020-07-08 | Disposition: A | Discharge status: Home / Self Care | Payer: Medicare Other | Source: Ambulatory Visit | Attending: Oncology | Admitting: Oncology

## 2020-07-08 ENCOUNTER — Encounter (HOSPITAL_COMMUNITY): Payer: Self-pay

## 2020-07-08 ENCOUNTER — Other Ambulatory Visit: Payer: Self-pay

## 2020-07-08 DIAGNOSIS — Z91041 Radiographic dye allergy status: Secondary | ICD-10-CM | POA: Insufficient documentation

## 2020-07-08 DIAGNOSIS — Z7984 Long term (current) use of oral hypoglycemic drugs: Secondary | ICD-10-CM | POA: Diagnosis not present

## 2020-07-08 DIAGNOSIS — Z79899 Other long term (current) drug therapy: Secondary | ICD-10-CM | POA: Insufficient documentation

## 2020-07-08 DIAGNOSIS — Z794 Long term (current) use of insulin: Secondary | ICD-10-CM | POA: Insufficient documentation

## 2020-07-08 DIAGNOSIS — I4819 Other persistent atrial fibrillation: Secondary | ICD-10-CM | POA: Diagnosis not present

## 2020-07-08 DIAGNOSIS — Z452 Encounter for adjustment and management of vascular access device: Secondary | ICD-10-CM | POA: Diagnosis not present

## 2020-07-08 DIAGNOSIS — I4891 Unspecified atrial fibrillation: Secondary | ICD-10-CM | POA: Insufficient documentation

## 2020-07-08 DIAGNOSIS — D49511 Neoplasm of unspecified behavior of right kidney: Secondary | ICD-10-CM

## 2020-07-08 DIAGNOSIS — I10 Essential (primary) hypertension: Secondary | ICD-10-CM | POA: Insufficient documentation

## 2020-07-08 DIAGNOSIS — E119 Type 2 diabetes mellitus without complications: Secondary | ICD-10-CM | POA: Diagnosis not present

## 2020-07-08 DIAGNOSIS — E785 Hyperlipidemia, unspecified: Secondary | ICD-10-CM | POA: Insufficient documentation

## 2020-07-08 DIAGNOSIS — Z880 Allergy status to penicillin: Secondary | ICD-10-CM | POA: Insufficient documentation

## 2020-07-08 DIAGNOSIS — Z7901 Long term (current) use of anticoagulants: Secondary | ICD-10-CM | POA: Insufficient documentation

## 2020-07-08 DIAGNOSIS — C641 Malignant neoplasm of right kidney, except renal pelvis: Secondary | ICD-10-CM | POA: Diagnosis not present

## 2020-07-08 DIAGNOSIS — Z882 Allergy status to sulfonamides status: Secondary | ICD-10-CM | POA: Insufficient documentation

## 2020-07-08 DIAGNOSIS — Z87891 Personal history of nicotine dependence: Secondary | ICD-10-CM | POA: Diagnosis not present

## 2020-07-08 DIAGNOSIS — C649 Malignant neoplasm of unspecified kidney, except renal pelvis: Secondary | ICD-10-CM | POA: Diagnosis not present

## 2020-07-08 HISTORY — PX: IR IMAGING GUIDED PORT INSERTION: IMG5740

## 2020-07-08 LAB — HEMOGLOBIN A1C
Hgb A1c MFr Bld: 8.3 % — ABNORMAL HIGH (ref 4.8–5.6)
Mean Plasma Glucose: 192 mg/dL

## 2020-07-08 LAB — GLUCOSE, CAPILLARY: Glucose-Capillary: 168 mg/dL — ABNORMAL HIGH (ref 70–99)

## 2020-07-08 MED ORDER — MIDAZOLAM HCL 2 MG/2ML IJ SOLN
INTRAMUSCULAR | Status: AC | PRN
  Administered 2020-07-08: 1 mg via INTRAVENOUS
  Administered 2020-07-08 (×2): 0.5 mg via INTRAVENOUS

## 2020-07-08 MED ORDER — LIDOCAINE-EPINEPHRINE 1 %-1:100000 IJ SOLN
INTRAMUSCULAR | Status: AC
  Filled 2020-07-08: qty 1

## 2020-07-08 MED ORDER — FENTANYL CITRATE (PF) 100 MCG/2ML IJ SOLN
INTRAMUSCULAR | Status: AC
  Filled 2020-07-08: qty 2

## 2020-07-08 MED ORDER — MIDAZOLAM HCL 2 MG/2ML IJ SOLN
INTRAMUSCULAR | Status: AC
  Filled 2020-07-08: qty 4

## 2020-07-08 MED ORDER — FENTANYL CITRATE (PF) 100 MCG/2ML IJ SOLN
INTRAMUSCULAR | Status: AC | PRN
  Administered 2020-07-08: 50 ug via INTRAVENOUS
  Administered 2020-07-08 (×2): 25 ug via INTRAVENOUS

## 2020-07-08 MED ORDER — HEPARIN SOD (PORK) LOCK FLUSH 100 UNIT/ML IV SOLN
INTRAVENOUS | Status: AC
  Filled 2020-07-08: qty 5

## 2020-07-08 MED ORDER — SODIUM CHLORIDE 0.9 % IV SOLN: INTRAVENOUS | Status: DC

## 2020-07-08 MED ORDER — LIDOCAINE-EPINEPHRINE 1 %-1:100000 IJ SOLN
INTRAMUSCULAR | Status: AC | PRN
  Administered 2020-07-08: 10 mL

## 2020-07-08 NOTE — Anesthesia Preprocedure Evaluation (Addendum)
Anesthesia Evaluation  Patient identified by MRN, date of birth, ID band Patient awake    Reviewed: Allergy & Precautions, NPO status , Patient's Chart, lab work & pertinent test results  Airway Mallampati: II  TM Distance: >3 FB Neck ROM: Full    Dental no notable dental hx.    Pulmonary former smoker,    Pulmonary exam normal breath sounds clear to auscultation       Cardiovascular hypertension, Pt. on medications and Pt. on home beta blockers Normal cardiovascular exam Rhythm:Regular Rate:Normal  ECG: rate 60. Normal sinus rhythm Right bundle branch block   Neuro/Psych negative neurological ROS  negative psych ROS   GI/Hepatic negative GI ROS, Neg liver ROS,   Endo/Other  diabetes, Oral Hypoglycemic Agents  Renal/GU Renal disease     Musculoskeletal  (+) Arthritis ,   Abdominal (+) + obese,   Peds  Hematology  (+) anemia , HLD   Anesthesia Other Findings Lumbar Stenosis  Reproductive/Obstetrics                           Anesthesia Physical Anesthesia Plan  ASA: III  Anesthesia Plan: General   Post-op Pain Management:    Induction: Intravenous  PONV Risk Score and Plan: 3 and Ondansetron, Dexamethasone and Treatment may vary due to age or medical condition  Airway Management Planned: Oral ETT  Additional Equipment:   Intra-op Plan:   Post-operative Plan: Extubation in OR  Informed Consent: I have reviewed the patients History and Physical, chart, labs and discussed the procedure including the risks, benefits and alternatives for the proposed anesthesia with the patient or authorized representative who has indicated his/her understanding and acceptance.     Dental advisory given  Plan Discussed with: CRNA  Anesthesia Plan Comments: (Reviewed PAT note written 07/08/2020 by Myra Gianotti, PA-C. )      Anesthesia Quick Evaluation

## 2020-07-08 NOTE — Progress Notes (Signed)
Anesthesia Chart Review:  Case: [id number] Date/Time: 07/14/20 1349   Procedure: Laminectomy and Foraminotomy - L3-L4 - L4-L5 - left with sublaminar decompression (Left Back) - 3C   Anesthesia type: General   Pre-op diagnosis: Stenosis   Location: [facility] OR ROOM 20 / [facility] OR   Surgeons: Eustace Moore, MD      DISCUSSION: Patient is a 75 year old female scheduled for the above procedure.   History includes former smoker (quit 12/28/81), DM2, HLD, HTN, right BBB, afib, clear cell renal cancer (T3aN1, s/p right robotic laparoscopic radical nephrectomy 01/29/20, adjuvant Pembrolizumab Q 3 weeks started 03/31/20, plan for Q 6 weeks starting 06/23/20), CKD, chronic bronchitis. BMI is consistent with obesity. S/p right IJ approach Port-a-cath 07/08/20 by Teodora Medici, MD.  Last oncology visit with Dr. Alen Blew on 06/23/20. He notes upcoming plans for back surgery for 07/09/20, and wrote, "no objection from an oncology standpoint.  Future treatment might need to be rescheduled pending her recovery." Last dose pembrolizumab 400 mg IV 06/23/20.   Preoperative cardiology input outlined by Almyra Deforest, [place] on 06/01/20: "Given past medical history and time since last visit, based on ACC/AHA guidelines, Darolyn Double would be at acceptable risk for the planned procedure without further cardiovascular testing...  The case has been reviewed by Dr. [name] who is okay for the patient to hold Coumadin for 7 days prior to the back procedure.  She will need to restart Coumadin as soon as possible after the procedure at the surgeon's discretion." - Last warfarin 07/06/20 as outlined by [facility] Peter Minium, [place], [place] 06/24/20). Plan to resume normal warfarin 07/14/20 PM or as directed per surgeon.   At 07/07/20 PAT RN visit, she reported recent COVID-19 exposure as she had just learned her granddaughter tested positive. She had a negative COVID-19 test on 07/07/20 for Port placement 07/08/20, and is scheduled for repeat  COVID-19 testing on 07/13/20. By notes, [place] booster 11/19/19.    Anesthesia team to evaluate on the day of surgery.  Repeat PT/INR on the day of surgery.    VS: BP (!) 145/58   Pulse (!) 56   Temp 37.1 C (Oral)   Resp 18   Ht 5' 3.5" (1.613 m)   Wt 91 kg   SpO2 100%   BMI 34.97 kg/m    PROVIDERS: Kelton Pillar, MD is PCP  Fransico Him, MD is cardiologist. Last visit 01/22/20 with Leanor Kail, PA for surgical clearance prior to nephrectomy. 12 month follow-up recommended. Zola Button, MD is Nyoka Lint, MD is urologist   LABS: Preoperative labs noted. A1c 8.3% (down from 8.6% five months ago), routed to Dr. Ronnald Ramp. H/H overall stable at 10.3/33.0. PLT 371K. Cr 1.06. INR 1.8, so will plan repeat PT/INR on the day of surgery.  (all labs ordered are listed, but only abnormal results are displayed)  Labs Reviewed  GLUCOSE, CAPILLARY - Abnormal; Notable for the following components:      Result Value   Glucose-Capillary 215 (*)    All other components within normal limits  CBC WITH DIFFERENTIAL/PLATELET - Abnormal; Notable for the following components:   RBC 3.73 (*)    Hemoglobin 10.3 (*)    HCT 33.0 (*)    All other components within normal limits  BASIC METABOLIC PANEL - Abnormal; Notable for the following components:   Sodium 134 (*)    Glucose, Bld 207 (*)    Creatinine, Ser 1.06 (*)    GFR, Estimated 55 (*)  All other components within normal limits  HEMOGLOBIN A1C - Abnormal; Notable for the following components:   Hgb A1c MFr Bld 8.3 (*)    All other components within normal limits  SARS CORONAVIRUS 2 BY RT PCR (HOSPITAL ORDER, [place] [facility])  SURGICAL PCR SCREEN     IMAGES: CXR 07/07/20:  FINDINGS: Cardiomediastinal silhouette is within normal limits. There is no focal airspace disease. There is no large pleural effusion or visible pneumothorax. There are multilevel degenerative changes of the  thoracic spine. No acute osseous abnormality. IMPRESSION: No evidence of acute cardiopulmonary disease.   EKG: 07/07/20:  Normal sinus rhythm Right bundle branch block Abnormal ECG No previous tracing Confirmed by Virgina Jock, Man[place] ([id number]) on 07/07/2020 4:02:21 PM - Known RBBB since at least 2019   CV: Cardiac event monitor 12/18/17 Baseline Date: 12/18/17  End Date: 01/16/18 Time Monitored: 3d 20h 31m Study Highlights  Sinus bradycardia, normal sinus rhythm and sinus tachycardia. The average heart rate was 73bpm with heart rate range form 50 to 118bpm.  Nuclear stress test 08/15/17:  The left ventricular ejection fraction is normal (55-65%).  Nuclear stress EF: 61%.  There was no ST segment deviation noted during stress.  The study is normal.  This is a low risk study. Normal resting and stress perfusion. No ischemia or infarction EF 61%   Past Medical History:  Diagnosis Date  . Arthritis   . Bronchitis, chronic, simple ([place])   . Chronic kidney disease   . Diabetes mellitus   . Hyperlipidemia   . Hypertension   . Obesity   . Persistent atrial fibrillation ([place])   . RBBB (right bundle branch block) 11/05/2017  . Renal cancer, right [facility])    s/p robotic laparoscopic right radical nephrectomy 01/29/20    Past Surgical History:  Procedure Laterality Date  . COLONOSCOPY  12/29/2010   Procedure: COLONOSCOPY;  Surgeon: Owens Loffler, MD;  Location: [facility] ENDOSCOPY;  Service: Endoscopy;  Laterality: N/A;  . ROBOT ASSISTED LAPAROSCOPIC NEPHRECTOMY N/A 01/29/2020   Procedure: XI ROBOTIC ASSISTED LAPAROSCOPIC RADICAL NEPHRECTOMY;  Surgeon: Raynelle Bring, MD;  Location: [facility] ORS;  Service: Urology;  Laterality: N/A;  . TUBAL LIGATION  1980    MEDICATIONS: . Clotrimazole-Betameth & Zn Ox 1-0.05 & 20 % THPK  . clotrimazole-betamethasone (LOTRISONE) cream  . Dulaglutide (TRULICITY) 1.5 TI/4.5YK SOPN  . fenofibrate micronized (LOFIBRA) 200 MG capsule  . flecainide (TAMBOCOR)  50 MG tablet  . HYDROcodone-acetaminophen (NORCO/VICODIN) 5-325 MG tablet  . lisinopril (ZESTRIL) 20 MG tablet  . magnesium oxide (MAG-OX) 400 MG tablet  . metFORMIN (GLUCOPHAGE) 1000 MG tablet  . metoprolol succinate (TOPROL-XL) 50 MG 24 hr tablet  . pembrolizumab (KEYTRUDA) 100 MG/4ML SOLN  . prochlorperazine (COMPAZINE) 10 MG tablet  . simvastatin (ZOCOR) 40 MG tablet  . warfarin (COUMADIN) 5 MG tablet   No current facility-administered medications for this encounter.    Myra Gianotti, PA-C Surgical Short Stay/Anesthesiology [facility] Phone [phone] [facility] Phone [phone] 07/08/2020 3:52 PM

## 2020-07-08 NOTE — Procedures (Signed)
Pre Procedure Dx: Poor venous access Post Procedural Dx: Same  Successful placement of right IJ approach port-a-cath with tip at the superior caval atrial junction. The catheter is ready for immediate use.  Estimated Blood Loss: Minimal  Complications: None immediate.  Jay Artrice Kraker, MD Pager #: [phone]   

## 2020-07-08 NOTE — Discharge Instructions (Signed)
Interventional radiology phone numbers [phone] After hours [phone]    You have skin glue (dermabond) over your new port. Do not use the lidocaine cream (EMLA cream) over the skin glue until it has healed. The petroleum in the lidocaine cream will dissolve the skin glue resulting in an infection of your new port. Use ice in a zip lock bag for 1-2 minutes over your new port before the cancer center nurses access your port.   Implanted Port Insertion, Care After This sheet gives you information about how to care for yourself after your procedure. Your health care provider may also give you more specific instructions. If you have problems or questions, contact your health care provider. What can I expect after the procedure? After the procedure, it is common to have:  Discomfort at the port insertion site.  Bruising on the skin over the port. This should improve over 3-4 days. Follow these instructions at home: Port care  After your port is placed, you will get a manufacturer's information card. The card has information about your port. Keep this card with you at all times.  Take care of the port as told by your health care provider. Ask your health care provider if you or a family member can get training for taking care of the port at home. A home health care nurse may also take care of the port.  Make sure to remember what type of port you have. Incision care 1. Follow instructions from your health care provider about how to take care of your port insertion site. Make sure you: ? Wash your hands with soap and water before and after you change your bandage (dressing). If soap and water are not available, use hand sanitizer. ? Change your dressing as told by your health care provider. 2. Leave skin glue in place. These skin closures may need to stay in place for 2 weeks or longer.  3. Check your port insertion site every day for signs of infection. Check for: ? Redness, swelling,  or pain. ? Fluid or blood. ? Warmth. ? Pus or a bad smell.      Activity  Return to your normal activities as told by your health care provider. Ask your health care provider what activities are safe for you.  Do not lift anything that is heavier than 10 lb (4.5 kg), or the limit that you are told, until your health care provider says that it is safe. General instructions  Take over-the-counter and prescription medicines only as told by your health care provider.  Do not take baths, swim, or use a hot tub until your health care provider approves.You may remove your dressing tomorrow and shower 24 hours after your procedure.  Do not drive for 24 hours if you were given a sedative during your procedure.  Wear a medical alert bracelet in case of an emergency. This will tell any health care providers that you have a port.  Keep all follow-up visits as told by your health care provider. This is important. Contact a health care provider if:  You cannot flush your port with saline as directed, or you cannot draw blood from the port.  You have a fever or chills.  You have redness, swelling, or pain around your port insertion site.  You have fluid or blood coming from your port insertion site.  Your port insertion site feels warm to the touch.  You have pus or a bad smell coming from the port insertion site.   Get help right away if:  You have chest pain or shortness of breath.  You have bleeding from your port that you cannot control. Summary  Take care of the port as told by your health care provider. Keep the manufacturer's information card with you at all times.  Change your dressing as told by your health care provider.  Contact a health care provider if you have a fever or chills or if you have redness, swelling, or pain around your port insertion site.  Keep all follow-up visits as told by your health care provider. This information is not intended to replace advice given  to you by your health care provider. Make sure you discuss any questions you have with your health care provider. Document Revised: 08/28/2017 Document Reviewed: 08/28/2017 Elsevier Patient Education  2021 Elsevier Inc.    Moderate Conscious Sedation, Adult, Care After This sheet gives you information about how to care for yourself after your procedure. Your health care provider may also give you more specific instructions. If you have problems or questions, contact your health care provider. What can I expect after the procedure? After the procedure, it is common to have:  Sleepiness for several hours.  Impaired judgment for several hours.  Difficulty with balance.  Vomiting if you eat too soon. Follow these instructions at home: For the time period you were told by your health care provider:  Rest.  Do not participate in activities where you could fall or become injured.  Do not drive or use machinery.  Do not drink alcohol.  Do not take sleeping pills or medicines that cause drowsiness.  Do not make important decisions or sign legal documents.  Do not take care of children on your own.      Eating and drinking 4. Follow the diet recommended by your health care provider. 5. Drink enough fluid to keep your urine pale yellow. 6. If you vomit: ? Drink water, juice, or soup when you can drink without vomiting. ? Make sure you have little or no nausea before eating solid foods.   General instructions  Take over-the-counter and prescription medicines only as told by your health care provider.  Have a responsible adult stay with you for the time you are told. It is important to have someone help care for you until you are awake and alert.  Do not smoke.  Keep all follow-up visits as told by your health care provider. This is important. Contact a health care provider if:  You are still sleepy or having trouble with balance after 24 hours.  You feel  light-headed.  You keep feeling nauseous or you keep vomiting.  You develop a rash.  You have a fever.  You have redness or swelling around the IV site. Get help right away if:  You have trouble breathing.  You have new-onset confusion at home. Summary  After the procedure, it is common to feel sleepy, have impaired judgment, or feel nauseous if you eat too soon.  Rest after you get home. Know the things you should not do after the procedure.  Follow the diet recommended by your health care provider and drink enough fluid to keep your urine pale yellow.  Get help right away if you have trouble breathing or new-onset confusion at home. This information is not intended to replace advice given to you by your health care provider. Make sure you discuss any questions you have with your health care provider. Document Revised: 05/30/2019 Document Reviewed: 12/26/2018   Elsevier Patient Education  2021 Elsevier Inc. 

## 2020-07-08 NOTE — H&P (Signed)
Chief Complaint: Patient was seen in consultation today for RCC/Port-a-cath placement.  Referring Physician(s): Wyatt Portela (oncology)  Supervising Physician: Sandi Mariscal  Patient Status: [facility] - Out-pt  History of Present Illness: Latasha Hodges is a 75 y.o. female with a past medical history of hypertension, hyperlipidemia, atrial fibrillation on chronic anticoagulation with Coumadin, RBBB, chronic bronchitis, RCC, diabetes mellitus, obesity, and arthritis. She was unfortunately diagnosed with clear-cell RCC in 01/2020. Her cancer is managed by Dr. Alen Blew. She underwent a robotic assisted laparoscopic right radial nephrectomy in OR 01/29/2020 by Dr. Alinda Money. She has tentative plans to begin systemic chemotherapy as management and is in need of durable IV access for administration.  IR consulted by Dr. Alen Blew for possible image-guided Port-a-cath placement. Patient awake and alert sitting in bed with no complaints at this time. Denies fever, chills, chest pain, dyspnea, abdominal pain, or headache.   Past Medical History:  Diagnosis Date  . Arthritis   . Bronchitis, chronic, simple ([place])   . Chronic kidney disease   . Diabetes mellitus   . Hyperlipidemia   . Hypertension   . Obesity   . Persistent atrial fibrillation ([place])   . RBBB (right bundle branch block) 11/05/2017  . Renal cancer, right [facility])    s/p robotic laparoscopic right radical nephrectomy 01/29/20    Past Surgical History:  Procedure Laterality Date  . COLONOSCOPY  12/29/2010   Procedure: COLONOSCOPY;  Surgeon: Owens Loffler, MD;  Location: [facility] ENDOSCOPY;  Service: Endoscopy;  Laterality: N/A;  . ROBOT ASSISTED LAPAROSCOPIC NEPHRECTOMY N/A 01/29/2020   Procedure: XI ROBOTIC ASSISTED LAPAROSCOPIC RADICAL NEPHRECTOMY;  Surgeon: Raynelle Bring, MD;  Location: [facility] ORS;  Service: Urology;  Laterality: N/A;  . TUBAL LIGATION  1980    Allergies: Iodinated diagnostic agents, No healthtouch food allergies,  Penicillins, Sulfa antibiotics, Sulfa drugs cross reactors, and Theophyllines  Medications: Prior to Admission medications   Medication Sig Start Date End Date Taking? Authorizing Provider  Clotrimazole-Betameth & Zn Ox 1-0.05 & 20 % THPK Apply to affected area twice a day. Patient not taking: Reported on 07/01/2020 06/02/20   Wyatt Portela, MD  clotrimazole-betamethasone (LOTRISONE) cream Apply 1 application topically 2 (two) times daily. May use with OTC zinc oxide cream. 06/04/20   Tanner, Lyndon Code., PA-C  Dulaglutide (TRULICITY) 1.5 KD/3.2IZ SOPN Inject 1.5 mg into the skin every Thursday.    [provider]  fenofibrate micronized (LOFIBRA) 200 MG capsule TAKE 1 CAPSULE BY MOUTH DAILY BEFORE BREAKFAST Patient taking differently: Take 200 mg by mouth daily before breakfast. 04/27/20   Turner, Eber Hong, MD  flecainide (TAMBOCOR) 50 MG tablet TAKE 1 TABLET(50 MG) BY MOUTH TWICE DAILY Patient taking differently: Take 50 mg by mouth 2 (two) times daily. 04/28/19   Sueanne Margarita, MD  HYDROcodone-acetaminophen (NORCO/VICODIN) 5-325 MG tablet TAKE 1 TO 2 TABLETS BY MOUTH EVERY 6 HOURS AS NEEDED FOR MODERATE PAIN OR SEVERE PAIN. Patient not taking: Reported on 07/01/2020 01/30/20 07/28/20  Raynelle Bring, MD  lisinopril (ZESTRIL) 20 MG tablet TAKE 1 TABLET(20 MG) BY MOUTH DAILY Patient taking differently: Take 20 mg by mouth daily. 04/21/20   Sueanne Margarita, MD  magnesium oxide (MAG-OX) 400 MG tablet Take 800 mg by mouth at bedtime.    [provider]  metFORMIN (GLUCOPHAGE) 1000 MG tablet Take 1,000 mg by mouth 2 (two) times daily. 01/07/14   [provider]  metoprolol succinate (TOPROL-XL) 50 MG 24 hr tablet TAKE 1 TABLET(50 MG) BY MOUTH DAILY Patient taking differently:  Take 50 mg by mouth daily. 05/19/20   Sueanne Margarita, MD  pembrolizumab (KEYTRUDA) 100 MG/4ML SOLN Inject 400 mg into the vein every 6 (six) weeks.    [provider]  prochlorperazine (COMPAZINE) 10  MG tablet Take 1 tablet (10 mg total) by mouth every 6 (six) hours as needed for nausea or vomiting. 03/17/20   Wyatt Portela, MD  simvastatin (ZOCOR) 40 MG tablet TAKE 1 TABLET(40 MG) BY MOUTH DAILY AT 6 PM Patient taking differently: Take 40 mg by mouth daily at 6 PM. 03/26/20   Turner, Eber Hong, MD  warfarin (COUMADIN) 5 MG tablet TAKE BY MOUTH AS DIRECTED EVERY DAY Patient taking differently: Take 2.5-5 mg by mouth See admin instructions. Take 5 mg by mouth on Monday, Wednesday and Friday, take 2.5 mg on Tuesday, Thursday, Saturday and Sunday 01/15/20   Sueanne Margarita, MD     Family History  Problem Relation Age of Onset  . Cancer Mother        BRAIN/THROAT  . Diabetes Father   . Hypertension Father   . Colonic polyp Cousin   . Colon cancer Neg Hx     Social History   Socioeconomic History  . Marital status: Divorced    Spouse name: Not on file  . Number of children: 2  . Years of education: Not on file  . Highest education level: Not on file  Occupational History  . Occupation: ADMIN ASSISTANT  Tobacco Use  . Smoking status: Former Smoker    Quit date: 12/28/1981    Years since quitting: 38.5  . Smokeless tobacco: Never Used  Vaping Use  . Vaping Use: Never used  Substance and Sexual Activity  . Alcohol use: Yes    Comment: occas.  . Drug use: No  . Sexual activity: Never    Birth control/protection: Post-menopausal  Other Topics Concern  . Not on file  Social History Narrative   0 caffeine drinks daily    Social Determinants of Health   Financial Resource Strain: Not on file  Food Insecurity: Not on file  Transportation Needs: Not on file  Physical Activity: Not on file  Stress: Not on file  Social Connections: Not on file     Review of Systems: A 12 point ROS discussed and pertinent positives are indicated in the HPI above.  All other systems are negative.  Review of Systems  Constitutional: Negative for chills and fever.  Respiratory: Negative for  shortness of breath and wheezing.   Cardiovascular: Negative for chest pain and palpitations.  Gastrointestinal: Negative for abdominal pain.  Neurological: Negative for headaches.  Psychiatric/Behavioral: Negative for behavioral problems and confusion.    Vital Signs: BP (!) 168/74   Pulse (!) 57   Temp 99 F (37.2 C) (Oral)   Resp (!) 22   SpO2 98%   Physical Exam Vitals and nursing note reviewed.  Constitutional:      General: She is not in acute distress.    Appearance: Normal appearance.  Cardiovascular:     Comments: Irregular rate, irregular rhythm. Pulmonary:     Effort: Pulmonary effort is normal. No respiratory distress.     Breath sounds: Normal breath sounds. No wheezing.  Skin:    General: Skin is warm and dry.  Neurological:     Mental Status: She is alert and oriented to person, place, and time.      MD Evaluation Airway: WNL Heart: WNL Abdomen: WNL Chest/ Lungs: WNL ASA  Classification:  3 Mallampati/Airway Score: Two   Imaging: No results found.  Labs:  CBC: Recent Labs    05/12/20 1151 06/02/20 1233 06/23/20 1233 07/07/20 1327  WBC 5.5 6.6 6.6 7.1  HGB 11.1* 10.4* 10.5* 10.3*  HCT 35.0* 32.4* 32.4* 33.0*  PLT 308 346 324 371    COAGS: Recent Labs    04/07/20 0826 05/12/20 1055 06/24/20 0828 07/07/20 1327  INR 2.3 2.8 1.8* 1.8*    BMP: Recent Labs    05/12/20 1151 06/02/20 1233 06/23/20 1233 07/07/20 1327  NA 141 141 140 134*  K 4.3 4.1 3.9 3.9  CL 108 107 106 101  CO2 22 23 25 25   GLUCOSE 243* 208* 183* 207*  BUN 36* 24* 26* 22  CALCIUM 9.2 9.6 9.2 9.2  CREATININE 1.11* 1.09* 1.10* 1.06*  GFRNONAA 53* 54* 53* 55*    LIVER FUNCTION TESTS: Recent Labs    04/21/20 1256 05/12/20 1151 06/02/20 1233 06/23/20 1233  BILITOT 0.3 0.4 0.4 0.3  AST 20 51* 31 26  ALT 24 58* 37 25  ALKPHOS 50 75 80 83  PROT 7.0 7.1 7.2 7.0  ALBUMIN 3.6 3.4* 3.4* 3.2*     Assessment and Plan:  RCC s/p robotic assisted  laparoscopic right radial nephrectomy in OR 01/29/2020 by Dr. Alinda Money; with tentative plans to begin systemic chemotherapy as management, in need of durable IV access for administration. Plan for image-guided Port-a-cath placement today in IR. Patient is NPO. Afebrile.  Risks and benefits of image-guided Port-a-catheter placement were discussed with the patient including, but not limited to bleeding, infection, pneumothorax, or fibrin sheath development and need for additional procedures. All of the patient's questions were answered, patient is agreeable to proceed. Consent signed and in chart.   Thank you for this interesting consult.  I greatly enjoyed meeting Latasha Hodges and look forward to participating in their care.  A copy of this report was sent to the requesting provider on this date.  Electronically Signed: Earley Abide, PA-C 07/08/2020, 1:39 PM   I spent a total of 15 Minutes in face to face in clinical consultation, greater than 50% of which was counseling/coordinating care for RCC/Port-a-cath placement.

## 2020-07-13 ENCOUNTER — Other Ambulatory Visit (HOSPITAL_COMMUNITY)
Admission: RE | Admit: 2020-07-13 | Discharge: 2020-07-13 | Disposition: A | Discharge status: Home / Self Care | Payer: Medicare Other | Source: Ambulatory Visit | Attending: Neurological Surgery | Admitting: Neurological Surgery

## 2020-07-13 DIAGNOSIS — Z20822 Contact with and (suspected) exposure to covid-19: Secondary | ICD-10-CM | POA: Diagnosis not present

## 2020-07-13 DIAGNOSIS — Z01812 Encounter for preprocedural laboratory examination: Secondary | ICD-10-CM | POA: Insufficient documentation

## 2020-07-13 LAB — SARS CORONAVIRUS 2 (TAT 6-24 HRS): SARS Coronavirus 2: NEGATIVE

## 2020-07-14 ENCOUNTER — Encounter (HOSPITAL_COMMUNITY): Payer: Self-pay | Admitting: Neurological Surgery

## 2020-07-14 ENCOUNTER — Ambulatory Visit (HOSPITAL_COMMUNITY)
Admission: RE | Admit: 2020-07-14 | Discharge: 2020-07-15 | Disposition: A | Discharge status: Home / Self Care | Payer: Medicare Other | Attending: Neurological Surgery | Admitting: Neurological Surgery

## 2020-07-14 ENCOUNTER — Encounter (HOSPITAL_COMMUNITY)
Admission: RE | Disposition: A | Discharge status: Home / Self Care | Payer: Self-pay | Source: Home / Self Care | Attending: Neurological Surgery

## 2020-07-14 ENCOUNTER — Ambulatory Visit (HOSPITAL_COMMUNITY): Payer: Medicare Other

## 2020-07-14 ENCOUNTER — Ambulatory Visit (HOSPITAL_COMMUNITY): Payer: Medicare Other | Admitting: Vascular Surgery

## 2020-07-14 DIAGNOSIS — E669 Obesity, unspecified: Secondary | ICD-10-CM | POA: Diagnosis not present

## 2020-07-14 DIAGNOSIS — M48061 Spinal stenosis, lumbar region without neurogenic claudication: Secondary | ICD-10-CM | POA: Diagnosis not present

## 2020-07-14 DIAGNOSIS — Z6834 Body mass index (BMI) 34.0-34.9, adult: Secondary | ICD-10-CM | POA: Diagnosis not present

## 2020-07-14 DIAGNOSIS — Z87891 Personal history of nicotine dependence: Secondary | ICD-10-CM | POA: Insufficient documentation

## 2020-07-14 DIAGNOSIS — Z91041 Radiographic dye allergy status: Secondary | ICD-10-CM | POA: Insufficient documentation

## 2020-07-14 DIAGNOSIS — M4316 Spondylolisthesis, lumbar region: Secondary | ICD-10-CM | POA: Insufficient documentation

## 2020-07-14 DIAGNOSIS — M4317 Spondylolisthesis, lumbosacral region: Secondary | ICD-10-CM | POA: Diagnosis not present

## 2020-07-14 DIAGNOSIS — Z888 Allergy status to other drugs, medicaments and biological substances status: Secondary | ICD-10-CM | POA: Insufficient documentation

## 2020-07-14 DIAGNOSIS — Z79899 Other long term (current) drug therapy: Secondary | ICD-10-CM | POA: Insufficient documentation

## 2020-07-14 DIAGNOSIS — Z880 Allergy status to penicillin: Secondary | ICD-10-CM | POA: Insufficient documentation

## 2020-07-14 DIAGNOSIS — Z7984 Long term (current) use of oral hypoglycemic drugs: Secondary | ICD-10-CM | POA: Insufficient documentation

## 2020-07-14 DIAGNOSIS — Z7901 Long term (current) use of anticoagulants: Secondary | ICD-10-CM | POA: Diagnosis not present

## 2020-07-14 DIAGNOSIS — Z882 Allergy status to sulfonamides status: Secondary | ICD-10-CM | POA: Insufficient documentation

## 2020-07-14 DIAGNOSIS — E1122 Type 2 diabetes mellitus with diabetic chronic kidney disease: Secondary | ICD-10-CM | POA: Diagnosis not present

## 2020-07-14 DIAGNOSIS — I129 Hypertensive chronic kidney disease with stage 1 through stage 4 chronic kidney disease, or unspecified chronic kidney disease: Secondary | ICD-10-CM | POA: Diagnosis not present

## 2020-07-14 DIAGNOSIS — N189 Chronic kidney disease, unspecified: Secondary | ICD-10-CM | POA: Diagnosis not present

## 2020-07-14 DIAGNOSIS — M79605 Pain in left leg: Secondary | ICD-10-CM | POA: Insufficient documentation

## 2020-07-14 DIAGNOSIS — Z9889 Other specified postprocedural states: Secondary | ICD-10-CM | POA: Diagnosis not present

## 2020-07-14 DIAGNOSIS — Z419 Encounter for procedure for purposes other than remedying health state, unspecified: Secondary | ICD-10-CM

## 2020-07-14 DIAGNOSIS — D631 Anemia in chronic kidney disease: Secondary | ICD-10-CM | POA: Diagnosis not present

## 2020-07-14 HISTORY — PX: LUMBAR LAMINECTOMY/DECOMPRESSION MICRODISCECTOMY: SHX5026

## 2020-07-14 LAB — GLUCOSE, CAPILLARY
Glucose-Capillary: 173 mg/dL — ABNORMAL HIGH (ref 70–99)
Glucose-Capillary: 162 mg/dL — ABNORMAL HIGH (ref 70–99)
Glucose-Capillary: 228 mg/dL — ABNORMAL HIGH (ref 70–99)
Glucose-Capillary: 264 mg/dL — ABNORMAL HIGH (ref 70–99)

## 2020-07-14 LAB — PROTIME-INR
INR: 1.1 (ref 0.8–1.2)
Prothrombin Time: 14.6 seconds (ref 11.4–15.2)

## 2020-07-14 SURGERY — LUMBAR LAMINECTOMY/DECOMPRESSION MICRODISCECTOMY 2 LEVELS
Anesthesia: General | Site: Back | Laterality: Left

## 2020-07-14 MED ORDER — FENTANYL CITRATE (PF) 250 MCG/5ML IJ SOLN
INTRAMUSCULAR | Status: DC | PRN
  Administered 2020-07-14 (×3): 50 ug via INTRAVENOUS

## 2020-07-14 MED ORDER — THROMBIN 5000 UNITS EX SOLR
OROMUCOSAL | Status: DC | PRN
  Administered 2020-07-14: 5 mL via TOPICAL

## 2020-07-14 MED ORDER — SUGAMMADEX SODIUM 200 MG/2ML IV SOLN
INTRAVENOUS | Status: DC | PRN
  Administered 2020-07-14: 200 mg via INTRAVENOUS

## 2020-07-14 MED ORDER — ACETAMINOPHEN 500 MG PO TABS
1000.0000 mg | ORAL_TABLET | ORAL | Status: AC
  Administered 2020-07-14: 1000 mg via ORAL
  Filled 2020-07-14: qty 2

## 2020-07-14 MED ORDER — GABAPENTIN 300 MG PO CAPS
300.0000 mg | ORAL_CAPSULE | ORAL | Status: AC
  Administered 2020-07-14: 300 mg via ORAL
  Filled 2020-07-14: qty 1

## 2020-07-14 MED ORDER — VANCOMYCIN HCL 1250 MG/250ML IV SOLN
1250.0000 mg | Freq: Once | INTRAVENOUS | Status: AC
  Administered 2020-07-15: 1250 mg via INTRAVENOUS
  Filled 2020-07-14: qty 250

## 2020-07-14 MED ORDER — OXYCODONE HCL 5 MG PO TABS
ORAL_TABLET | ORAL | Status: AC
  Filled 2020-07-14: qty 1

## 2020-07-14 MED ORDER — SODIUM CHLORIDE 0.9% FLUSH: 3.0000 mL | INTRAVENOUS | Status: DC | PRN

## 2020-07-14 MED ORDER — AMISULPRIDE (ANTIEMETIC) 5 MG/2ML IV SOLN: 10.0000 mg | Freq: Once | INTRAVENOUS | Status: DC | PRN

## 2020-07-14 MED ORDER — DEXAMETHASONE SODIUM PHOSPHATE 10 MG/ML IJ SOLN
10.0000 mg | Freq: Once | INTRAMUSCULAR | Status: AC
  Administered 2020-07-14: 5 mg via INTRAVENOUS
  Filled 2020-07-14: qty 1

## 2020-07-14 MED ORDER — PHENOL 1.4 % MT LIQD: 1.0000 | OROMUCOSAL | Status: DC | PRN

## 2020-07-14 MED ORDER — EPHEDRINE 5 MG/ML INJ
INTRAVENOUS | Status: AC
  Filled 2020-07-14: qty 10

## 2020-07-14 MED ORDER — SODIUM CHLORIDE 0.9% FLUSH
3.0000 mL | Freq: Two times a day (BID) | INTRAVENOUS | Status: DC
  Administered 2020-07-14: 3 mL via INTRAVENOUS

## 2020-07-14 MED ORDER — MENTHOL 3 MG MT LOZG: 1.0000 | LOZENGE | OROMUCOSAL | Status: DC | PRN

## 2020-07-14 MED ORDER — LIDOCAINE 2% (20 MG/ML) 5 ML SYRINGE
INTRAMUSCULAR | Status: AC
  Filled 2020-07-14: qty 5

## 2020-07-14 MED ORDER — SENNA 8.6 MG PO TABS
1.0000 | ORAL_TABLET | Freq: Two times a day (BID) | ORAL | Status: DC
  Administered 2020-07-15: 8.6 mg via ORAL
  Filled 2020-07-14: qty 1

## 2020-07-14 MED ORDER — SODIUM CHLORIDE 0.9 % IV SOLN: 250.0000 mL | INTRAVENOUS | Status: DC

## 2020-07-14 MED ORDER — CHLORHEXIDINE GLUCONATE CLOTH 2 % EX PADS: 6.0000 | MEDICATED_PAD | Freq: Once | CUTANEOUS | Status: DC

## 2020-07-14 MED ORDER — ONDANSETRON HCL 4 MG/2ML IJ SOLN: 4.0000 mg | Freq: Four times a day (QID) | INTRAMUSCULAR | Status: DC | PRN

## 2020-07-14 MED ORDER — BUPIVACAINE HCL (PF) 0.25 % IJ SOLN
INTRAMUSCULAR | Status: AC
  Filled 2020-07-14: qty 30

## 2020-07-14 MED ORDER — THROMBIN 5000 UNITS EX SOLR
CUTANEOUS | Status: DC | PRN
  Administered 2020-07-14 (×2): 5000 [IU] via TOPICAL

## 2020-07-14 MED ORDER — PROPOFOL 10 MG/ML IV BOLUS
INTRAVENOUS | Status: DC | PRN
  Administered 2020-07-14: 120 mg via INTRAVENOUS

## 2020-07-14 MED ORDER — ROCURONIUM BROMIDE 10 MG/ML (PF) SYRINGE
PREFILLED_SYRINGE | INTRAVENOUS | Status: DC | PRN
  Administered 2020-07-14: 50 mg via INTRAVENOUS

## 2020-07-14 MED ORDER — PHENYLEPHRINE 40 MCG/ML (10ML) SYRINGE FOR IV PUSH (FOR BLOOD PRESSURE SUPPORT)
PREFILLED_SYRINGE | INTRAVENOUS | Status: AC
  Filled 2020-07-14: qty 10

## 2020-07-14 MED ORDER — INSULIN ASPART 100 UNIT/ML IJ SOLN
0.0000 [IU] | Freq: Three times a day (TID) | INTRAMUSCULAR | Status: DC
  Administered 2020-07-15: 5 [IU] via SUBCUTANEOUS

## 2020-07-14 MED ORDER — MORPHINE SULFATE (PF) 2 MG/ML IV SOLN
2.0000 mg | INTRAVENOUS | Status: DC | PRN
Start: 2020-07-14 — End: 2020-07-15

## 2020-07-14 MED ORDER — EPHEDRINE SULFATE-NACL 50-0.9 MG/10ML-% IV SOSY
PREFILLED_SYRINGE | INTRAVENOUS | Status: DC | PRN
  Administered 2020-07-14 (×6): 5 mg via INTRAVENOUS

## 2020-07-14 MED ORDER — ACETAMINOPHEN 325 MG PO TABS: 650.0000 mg | ORAL_TABLET | ORAL | Status: DC | PRN

## 2020-07-14 MED ORDER — LACTATED RINGERS IV SOLN: INTRAVENOUS | Status: DC

## 2020-07-14 MED ORDER — ONDANSETRON HCL 4 MG PO TABS: 4.0000 mg | ORAL_TABLET | Freq: Four times a day (QID) | ORAL | Status: DC | PRN

## 2020-07-14 MED ORDER — HEMOSTATIC AGENTS (NO CHARGE) OPTIME
TOPICAL | Status: DC | PRN
  Administered 2020-07-14: 1 via TOPICAL

## 2020-07-14 MED ORDER — POTASSIUM CHLORIDE IN NACL 20-0.9 MEQ/L-% IV SOLN: INTRAVENOUS | Status: DC

## 2020-07-14 MED ORDER — ONDANSETRON HCL 4 MG/2ML IJ SOLN
INTRAMUSCULAR | Status: AC
  Filled 2020-07-14: qty 2

## 2020-07-14 MED ORDER — ACETAMINOPHEN 650 MG RE SUPP: 650.0000 mg | RECTAL | Status: DC | PRN

## 2020-07-14 MED ORDER — ONDANSETRON HCL 4 MG/2ML IJ SOLN: 4.0000 mg | Freq: Once | INTRAMUSCULAR | Status: DC | PRN

## 2020-07-14 MED ORDER — WHITE PETROLATUM EX OINT
TOPICAL_OINTMENT | CUTANEOUS | Status: AC
  Filled 2020-07-14: qty 28.35

## 2020-07-14 MED ORDER — METHOCARBAMOL 500 MG PO TABS
ORAL_TABLET | ORAL | Status: AC
  Filled 2020-07-14: qty 1

## 2020-07-14 MED ORDER — INSULIN ASPART 100 UNIT/ML IJ SOLN
0.0000 [IU] | Freq: Every day | INTRAMUSCULAR | Status: DC
  Administered 2020-07-14: 3 [IU] via SUBCUTANEOUS

## 2020-07-14 MED ORDER — METFORMIN HCL 500 MG PO TABS
1000.0000 mg | ORAL_TABLET | Freq: Two times a day (BID) | ORAL | Status: DC
  Administered 2020-07-14 – 2020-07-15 (×2): 1000 mg via ORAL
  Filled 2020-07-14 (×2): qty 2

## 2020-07-14 MED ORDER — 0.9 % SODIUM CHLORIDE (POUR BTL) OPTIME
TOPICAL | Status: DC | PRN
  Administered 2020-07-14: 1000 mL

## 2020-07-14 MED ORDER — METHOCARBAMOL 1000 MG/10ML IJ SOLN
500.0000 mg | Freq: Four times a day (QID) | INTRAMUSCULAR | Status: DC | PRN
  Filled 2020-07-14: qty 5

## 2020-07-14 MED ORDER — METHOCARBAMOL 500 MG PO TABS
500.0000 mg | ORAL_TABLET | Freq: Four times a day (QID) | ORAL | Status: DC | PRN
  Administered 2020-07-14 – 2020-07-15 (×3): 500 mg via ORAL
  Filled 2020-07-14 (×2): qty 1

## 2020-07-14 MED ORDER — FENTANYL CITRATE (PF) 250 MCG/5ML IJ SOLN
INTRAMUSCULAR | Status: AC
  Filled 2020-07-14: qty 5

## 2020-07-14 MED ORDER — CHLORHEXIDINE GLUCONATE 0.12 % MT SOLN
15.0000 mL | Freq: Once | OROMUCOSAL | Status: AC
  Administered 2020-07-14: 15 mL via OROMUCOSAL
  Filled 2020-07-14: qty 15

## 2020-07-14 MED ORDER — THROMBIN 5000 UNITS EX SOLR
CUTANEOUS | Status: AC
  Filled 2020-07-14: qty 10000

## 2020-07-14 MED ORDER — ACETAMINOPHEN 500 MG PO TABS
1000.0000 mg | ORAL_TABLET | Freq: Four times a day (QID) | ORAL | Status: DC
  Administered 2020-07-14 – 2020-07-15 (×3): 1000 mg via ORAL
  Filled 2020-07-14 (×3): qty 2

## 2020-07-14 MED ORDER — THROMBIN 5000 UNITS EX SOLR
CUTANEOUS | Status: AC
  Filled 2020-07-14: qty 5000

## 2020-07-14 MED ORDER — PHENYLEPHRINE 40 MCG/ML (10ML) SYRINGE FOR IV PUSH (FOR BLOOD PRESSURE SUPPORT)
PREFILLED_SYRINGE | INTRAVENOUS | Status: DC | PRN
  Administered 2020-07-14: 120 ug via INTRAVENOUS

## 2020-07-14 MED ORDER — OXYCODONE HCL 5 MG PO TABS
5.0000 mg | ORAL_TABLET | ORAL | Status: DC | PRN
  Administered 2020-07-14 (×3): 5 mg via ORAL
  Filled 2020-07-14 (×2): qty 1

## 2020-07-14 MED ORDER — FENTANYL CITRATE (PF) 100 MCG/2ML IJ SOLN: 25.0000 ug | INTRAMUSCULAR | Status: DC | PRN

## 2020-07-14 MED ORDER — PROPOFOL 10 MG/ML IV BOLUS
INTRAVENOUS | Status: AC
  Filled 2020-07-14: qty 20

## 2020-07-14 MED ORDER — VANCOMYCIN HCL IN DEXTROSE 1-5 GM/200ML-% IV SOLN
1000.0000 mg | INTRAVENOUS | Status: AC
  Administered 2020-07-14: 1000 mg via INTRAVENOUS
  Filled 2020-07-14: qty 200

## 2020-07-14 MED ORDER — BUPIVACAINE HCL (PF) 0.25 % IJ SOLN
INTRAMUSCULAR | Status: DC | PRN
  Administered 2020-07-14: 6 mL

## 2020-07-14 MED ORDER — LIDOCAINE 2% (20 MG/ML) 5 ML SYRINGE
INTRAMUSCULAR | Status: DC | PRN
  Administered 2020-07-14: 80 mg via INTRAVENOUS

## 2020-07-14 MED ORDER — ONDANSETRON HCL 4 MG/2ML IJ SOLN
INTRAMUSCULAR | Status: DC | PRN
  Administered 2020-07-14: 4 mg via INTRAVENOUS

## 2020-07-14 MED ORDER — ROCURONIUM BROMIDE 10 MG/ML (PF) SYRINGE
PREFILLED_SYRINGE | INTRAVENOUS | Status: AC
  Filled 2020-07-14: qty 10

## 2020-07-14 MED ORDER — ORAL CARE MOUTH RINSE: 15.0000 mL | Freq: Once | OROMUCOSAL | Status: AC

## 2020-07-14 SURGICAL SUPPLY — 44 items
BAND RUBBER #18 3X1/16 STRL (MISCELLANEOUS) IMPLANT
BENZOIN TINCTURE PRP APPL 2/3 (GAUZE/BANDAGES/DRESSINGS) ×2 IMPLANT
BUR CARBIDE MATCH 3.0 (BURR) ×2 IMPLANT
CANISTER SUCT 3000ML PPV (MISCELLANEOUS) ×2 IMPLANT
CHLORAPREP W/TINT 26 (MISCELLANEOUS) ×2 IMPLANT
COVER WAND RF STERILE (DRAPES) ×2 IMPLANT
DERMABOND ADVANCED (GAUZE/BANDAGES/DRESSINGS) ×1
DERMABOND ADVANCED .7 DNX12 (GAUZE/BANDAGES/DRESSINGS) ×1 IMPLANT
DRAPE INCISE 23X17 IOBAN STRL (DRAPES) ×1
DRAPE INCISE IOBAN 23X17 STRL (DRAPES) ×1 IMPLANT
DRAPE LAPAROTOMY 100X72X124 (DRAPES) ×2 IMPLANT
DRAPE MICROSCOPE LEICA (MISCELLANEOUS) IMPLANT
DRAPE STERI IOBAN 125X83 (DRAPES) IMPLANT
DRAPE SURG 17X23 STRL (DRAPES) ×2 IMPLANT
DRSG OPSITE POSTOP 3X4 (GAUZE/BANDAGES/DRESSINGS) ×2 IMPLANT
DURAPREP 26ML APPLICATOR (WOUND CARE) IMPLANT
ELECT REM PT RETURN 9FT ADLT (ELECTROSURGICAL) ×2
ELECTRODE REM PT RTRN 9FT ADLT (ELECTROSURGICAL) ×1 IMPLANT
GAUZE 4X4 16PLY RFD (DISPOSABLE) IMPLANT
GLOVE BIO SURGEON STRL SZ7 (GLOVE) IMPLANT
GLOVE BIO SURGEON STRL SZ8 (GLOVE) ×2 IMPLANT
GLOVE SURG UNDER POLY LF SZ7 (GLOVE) ×2 IMPLANT
GOWN STRL REUS W/ TWL LRG LVL3 (GOWN DISPOSABLE) ×2 IMPLANT
GOWN STRL REUS W/ TWL XL LVL3 (GOWN DISPOSABLE) ×2 IMPLANT
GOWN STRL REUS W/TWL 2XL LVL3 (GOWN DISPOSABLE) IMPLANT
GOWN STRL REUS W/TWL LRG LVL3 (GOWN DISPOSABLE) ×2
GOWN STRL REUS W/TWL XL LVL3 (GOWN DISPOSABLE) ×2
HEMOSTAT POWDER KIT SURGIFOAM (HEMOSTASIS) ×2 IMPLANT
KIT BASIN OR (CUSTOM PROCEDURE TRAY) ×2 IMPLANT
KIT TURNOVER KIT B (KITS) ×2 IMPLANT
NEEDLE HYPO 25X1 1.5 SAFETY (NEEDLE) ×2 IMPLANT
NEEDLE SPNL 20GX3.5 QUINCKE YW (NEEDLE) ×2 IMPLANT
NS IRRIG 1000ML POUR BTL (IV SOLUTION) ×2 IMPLANT
PACK LAMINECTOMY NEURO (CUSTOM PROCEDURE TRAY) ×2 IMPLANT
PAD ARMBOARD 7.5X6 YLW CONV (MISCELLANEOUS) ×6 IMPLANT
SPONGE SURGIFOAM ABS GEL SZ50 (HEMOSTASIS) ×2 IMPLANT
STRIP CLOSURE SKIN 1/2X4 (GAUZE/BANDAGES/DRESSINGS) ×2 IMPLANT
SUT VIC AB 0 CT1 18XCR BRD8 (SUTURE) ×1 IMPLANT
SUT VIC AB 0 CT1 8-18 (SUTURE) ×1
SUT VIC AB 2-0 CP2 18 (SUTURE) ×2 IMPLANT
SUT VIC AB 3-0 SH 8-18 (SUTURE) ×4 IMPLANT
TOWEL GREEN STERILE (TOWEL DISPOSABLE) ×2 IMPLANT
TOWEL GREEN STERILE FF (TOWEL DISPOSABLE) ×2 IMPLANT
WATER STERILE IRR 1000ML POUR (IV SOLUTION) ×2 IMPLANT

## 2020-07-14 NOTE — Progress Notes (Signed)
Pharmacy Antibiotic Note  Latasha Hodges is a 75 y.o. female admitted on 07/14/2020 with lumbar spinal stenosis oif L3-4 and L4-5 for decompression.  Pharmacy has been consulted for vancomycin dosing for surgical prophylaxis. No drain in place.   Plan: Vancomycin 1250mg  x1   Height: 5' 3.5" (161.3 cm) Weight: 91 kg (200 lb 9.9 oz) IBW/kg (Calculated) : 53.55  Temp (24hrs), Avg:97.9 F (36.6 C), Min:97.6 F (36.4 C), Max:98.6 F (37 C)  No results for input(s): WBC, CREATININE, LATICACIDVEN, VANCOTROUGH, VANCOPEAK, VANCORANDOM, GENTTROUGH, GENTPEAK, GENTRANDOM, TOBRATROUGH, TOBRAPEAK, TOBRARND, AMIKACINPEAK, AMIKACINTROU, AMIKACIN in the last 168 hours.  Estimated Creatinine Clearance: 51.2 mL/min (A) (by C-G formula based on SCr of 1.06 mg/dL (H)).    Allergies  Allergen Reactions  . Iodinated Diagnostic Agents Hives, Swelling and Rash  . No Healthtouch Food Allergies Rash    Seafood  . Penicillins Rash    Childhood reaction.  . Sulfa Antibiotics Hives and Rash  . Sulfa Drugs Cross Reactors Rash    Childhood reaction.  . Theophyllines Rash    Antimicrobials this admission: Vancomycin x2 6/1  Dose adjustments this admission: N/a  Microbiology results:  Thank you for allowing pharmacy to be a part of this patient's care.  Cristela Felt, PharmD Clinical Pharmacist  07/14/2020 2:54 PM

## 2020-07-14 NOTE — Transfer of Care (Signed)
Immediate Anesthesia Transfer of Care Note  Patient: Latasha Hodges  Procedure(s) Performed: Laminectomy and Foraminotomy - Lumbar three-Lumbar four- Lumbar four-Lumbar five - left with sublaminar decompression (Left Back)  Patient Location: PACU  Anesthesia Type:General  Level of Consciousness: awake and alert   Airway & Oxygen Therapy: Patient Spontanous Breathing and Patient connected to face mask oxygen  Post-op Assessment: Report given to RN, Post -op Vital signs reviewed and stable and Patient moving all extremities X 4  Post vital signs: Reviewed and stable  Last Vitals:  Vitals Value Taken Time  BP 134/63 07/14/20 1330  Temp    Pulse 51 07/14/20 1332  Resp 10 07/14/20 1332  SpO2 100 % 07/14/20 1332  Vitals shown include unvalidated device data.  Last Pain:  Vitals:   07/14/20 0918  TempSrc:   PainSc: 0-No pain      Patients Stated Pain Goal: 3 (49/82/64 1583)  Complications: No complications documented.

## 2020-07-14 NOTE — Anesthesia Postprocedure Evaluation (Signed)
Anesthesia Post Note  Patient: Latasha Hodges  Procedure(s) Performed: Laminectomy and Foraminotomy - Lumbar three-Lumbar four- Lumbar four-Lumbar five - left with sublaminar decompression (Left Back)     Patient location during evaluation: PACU Anesthesia Type: General Level of consciousness: awake Pain management: pain level controlled Vital Signs Assessment: post-procedure vital signs reviewed and stable Respiratory status: spontaneous breathing, nonlabored ventilation, respiratory function stable and patient connected to nasal cannula oxygen Cardiovascular status: blood pressure returned to baseline and stable Postop Assessment: no apparent nausea or vomiting Anesthetic complications: no   No complications documented.  Last Vitals:  Vitals:   07/14/20 1743 07/14/20 1930  BP: 132/62 133/64  Pulse: (!) 56 (!) 53  Resp: 18 18  Temp: 36.4 C 36.8 C  SpO2: 99% 100%    Last Pain:  Vitals:   07/14/20 1930  TempSrc: Oral  PainSc: 3                  [name] P Yohann Curl

## 2020-07-14 NOTE — Op Note (Signed)
07/14/2020  1:17 PM  PATIENT:  Latasha Hodges [birth year]  75 y.o. female  PRE-OPERATIVE DIAGNOSIS: Lumbar spinal stenosis L3-4 and L4-5 with back pain and leg pain  POST-OPERATIVE DIAGNOSIS:  same  PROCEDURE: Decompressive lumbar hemilaminectomy medial facetectomy foraminotomies L3-4 and L4-5 on the left followed by sublaminar decompression  SURGEON:  Sherley Bounds, MD  ASSISTANTS: Dr Marcello Moores  ANESTHESIA:   General  EBL: 75 ml  Total I/O In: -  Out: 75 [Blood:75]  BLOOD ADMINISTERED: none  DRAINS: none  SPECIMEN:  none  INDICATION FOR PROCEDURE: This patient presented with back and left more than right leg pain. Imaging showed very spinal stenosis L3-4 and L4-5 with spondylolisthesis L2-3 L4-5 and L5-S1.. The patient tried conservative measures without relief. Pain was debilitating. Recommended decompressive lumbar hemilaminectomy with sublaminar decompression L3-4 and L4-5 in the hopes of avoiding instrumented fusion. Patient understood the risks, benefits, and alternatives and potential outcomes and wished to proceed.  PROCEDURE DETAILS: The patient was taken to the operating room and after induction of adequate generalized endotracheal anesthesia, the patient was rolled into the prone position on the Wilson frame and all pressure points were padded. The lumbar region was cleaned and then prepped with DuraPrep and draped in the usual sterile fashion. 5 cc of local anesthesia was injected and then a dorsal midline incision was made and carried down to the lumbo sacral fascia. The fascia was opened and the paraspinous musculature was taken down in a subperiosteal fashion to expose L3-4 and L4-5 on the left. Intraoperative x-ray confirmed my level, and then I used a combination of the high-speed drill and the Kerrison punches to perform a hemilaminectomy, medial facetectomy, and foraminotomy at L3-4 and L4-5 on the left. The underlying yellow ligament was opened and removed in a piecemeal fashion  to expose the underlying dura and exiting nerve root. I undercut the lateral recess and dissected down until I was medial to and distal to the pedicle. The nerve root was well decompressed.  I drilled up under the spinous process at both levels and then performed a sublaminar decompression with the 3 mm Kerrison punch to decompress the central canal in the right lateral recess from the left-hand side.  I could palpate the pedicle on the right.  We then gently retracted the nerve root medially with a retractor, coagulated the epidural venous vasculature, and inspected the disc space at L4-5.  There was a large subannular disc herniation and so I made a small cruciate incision in the superior part of the free fragment and remove several pieces of disc from just under the L4 nerve root and from the lateral part of the disc space.  I did not perform an intradiscal discectomy in hopes of avoiding further destabilization of the segment.  . I then palpated with a coronary dilator along the nerve root and into the foramen to assure adequate decompression. I felt no more compression of the nerve root. I irrigated with saline solution containing bacitracin. Achieved hemostasis with bipolar cautery, lined the dura with Gelfoam, and then closed the fascia with 0 Vicryl. I closed the subcutaneous tissues with 2-0 Vicryl and the subcuticular tissues with 3-0 Vicryl. The skin was then closed with benzoin and Steri-Strips. The drapes were removed, a sterile dressing was applied.  My nurse practitioner was involved in the exposure, safe retraction of the neural elements, the disc work and Dr. Marcello Moores helped with the closure. the patient was awakened from general anesthesia and transferred to the recovery room  in stable condition. At the end of the procedure all sponge, needle and instrument counts were correct.    PLAN OF CARE: Admit for overnight observation  PATIENT DISPOSITION:  PACU - hemodynamically stable.   Delay start  of Pharmacological VTE agent (>24hrs) due to surgical blood loss or risk of bleeding:  yes

## 2020-07-14 NOTE — Anesthesia Procedure Notes (Signed)
Procedure Name: Intubation Date/Time: 07/14/2020 11:13 AM Performed by: Reece Agar, CRNA Pre-anesthesia Checklist: Patient identified, Emergency Drugs available, Suction available and Patient being monitored Patient Re-evaluated:Patient Re-evaluated prior to induction Oxygen Delivery Method: Circle System Utilized Preoxygenation: Pre-oxygenation with 100% oxygen Induction Type: IV induction Ventilation: Mask ventilation without difficulty Laryngoscope Size: Mac and 3 Grade View: Grade I Tube type: Oral Tube size: 7.0 mm Number of attempts: 1 Airway Equipment and Method: Stylet and Oral airway Placement Confirmation: ETT inserted through vocal cords under direct vision,  positive ETCO2 and breath sounds checked- equal and bilateral Secured at: 20 cm Tube secured with: Tape Dental Injury: Teeth and Oropharynx as per pre-operative assessment

## 2020-07-14 NOTE — H&P (Signed)
Subjective: Patient is a 75 y.o. female admitted for lumbar spinal stenosis with left leg pain. Onset of symptoms was several months ago, gradually worsening since that time.  The pain is rated severe, and is located at the across the lower back and radiates to left lower extremity. The pain is described as aching and occurs all day. The symptoms have been progressive. Symptoms are exacerbated by exercise and standing. MRI or CT showed spinal stenosis L3-4 L4-5  Past Medical History:  Diagnosis Date  . Arthritis   . Bronchitis, chronic, simple ([place])   . Chronic kidney disease   . Diabetes mellitus   . Hyperlipidemia   . Hypertension   . Obesity   . Persistent atrial fibrillation ([place])   . RBBB (right bundle branch block) 11/05/2017  . Renal cancer, right [facility])    s/p robotic laparoscopic right radical nephrectomy 01/29/20    Past Surgical History:  Procedure Laterality Date  . COLONOSCOPY  12/29/2010   Procedure: COLONOSCOPY;  Surgeon: Owens Loffler, MD;  Location: [facility] ENDOSCOPY;  Service: Endoscopy;  Laterality: N/A;  . IR IMAGING GUIDED PORT INSERTION  07/08/2020  . ROBOT ASSISTED LAPAROSCOPIC NEPHRECTOMY N/A 01/29/2020   Procedure: XI ROBOTIC ASSISTED LAPAROSCOPIC RADICAL NEPHRECTOMY;  Surgeon: Raynelle Bring, MD;  Location: [facility] ORS;  Service: Urology;  Laterality: N/A;  . [place]    Prior to Admission medications   Medication Sig Start Date End Date Taking? Authorizing Provider  clotrimazole-betamethasone (LOTRISONE) cream Apply 1 application topically 2 (two) times daily. May use with OTC zinc oxide cream. 06/04/20  Yes Tanner, Lyndon Code., PA-C  Dulaglutide (TRULICITY) 1.5 LO/7.5IE SOPN Inject 1.5 mg into the skin every Thursday.   Yes [provider]  fenofibrate micronized (LOFIBRA) 200 MG capsule TAKE 1 CAPSULE BY MOUTH DAILY BEFORE BREAKFAST Patient taking differently: Take 200 mg by mouth daily before breakfast. 04/27/20  Yes Turner, Eber Hong, MD  flecainide  (TAMBOCOR) 50 MG tablet TAKE 1 TABLET(50 MG) BY MOUTH TWICE DAILY Patient taking differently: Take 50 mg by mouth 2 (two) times daily. 04/28/19  Yes Turner, Traci R, MD  lisinopril (ZESTRIL) 20 MG tablet TAKE 1 TABLET(20 MG) BY MOUTH DAILY Patient taking differently: Take 20 mg by mouth daily. 04/21/20  Yes Turner, Eber Hong, MD  magnesium oxide (MAG-OX) 400 MG tablet Take 800 mg by mouth at bedtime.   Yes [provider]  metFORMIN (GLUCOPHAGE) 1000 MG tablet Take 1,000 mg by mouth 2 (two) times daily. 01/07/14  Yes [provider]  metoprolol succinate (TOPROL-XL) 50 MG 24 hr tablet TAKE 1 TABLET(50 MG) BY MOUTH DAILY Patient taking differently: Take 50 mg by mouth daily. 05/19/20  Yes Turner, Eber Hong, MD  prochlorperazine (COMPAZINE) 10 MG tablet Take 1 tablet (10 mg total) by mouth every 6 (six) hours as needed for nausea or vomiting. 03/17/20  Yes Wyatt Portela, MD  simvastatin (ZOCOR) 40 MG tablet TAKE 1 TABLET(40 MG) BY MOUTH DAILY AT 6 PM Patient taking differently: Take 40 mg by mouth daily at 6 PM. 03/26/20  Yes Turner, Eber Hong, MD  warfarin (COUMADIN) 5 MG tablet TAKE BY MOUTH AS DIRECTED EVERY DAY Patient taking differently: Take 2.5-5 mg by mouth See admin instructions. Take 5 mg by mouth on Monday, Wednesday and Friday, take 2.5 mg on Tuesday, Thursday, Saturday and Sunday 01/15/20  Yes Turner, Eber Hong, MD  Clotrimazole-Betameth & Zn Ox 1-0.05 & 20 % THPK Apply to affected area twice a day. Patient not  taking: Reported on 07/01/2020 06/02/20   Wyatt Portela, MD  HYDROcodone-acetaminophen (NORCO/VICODIN) 5-325 MG tablet TAKE 1 TO 2 TABLETS BY MOUTH EVERY 6 HOURS AS NEEDED FOR MODERATE PAIN OR SEVERE PAIN. Patient not taking: No sig reported 01/30/20 07/28/20  Raynelle Bring, MD  pembrolizumab [facility]) 100 MG/4ML SOLN Inject 400 mg into the vein every 6 (six) weeks.    [provider]   Allergies  Allergen Reactions  . Iodinated Diagnostic Agents Hives, Swelling  and Rash  . No Healthtouch Food Allergies Rash    Seafood  . Penicillins Rash    Childhood reaction.  . Sulfa Antibiotics Hives and Rash  . Sulfa Drugs Cross Reactors Rash    Childhood reaction.  . Theophyllines Rash    Social History   Tobacco Use  . Smoking status: Former Smoker    Quit date: 12/28/1981    Years since quitting: 38.5  . Smokeless tobacco: Never Used  Substance Use Topics  . Alcohol use: Yes    Comment: occas.    Family History  Problem Relation Age of Onset  . Cancer Mother        BRAIN/THROAT  . Diabetes Father   . Hypertension Father   . Colonic polyp Cousin   . Colon cancer Neg Hx      Review of Systems  Positive ROS: Negative  All other systems have been reviewed and were otherwise negative with the exception of those mentioned in the HPI and as above.  Objective: Vital signs in last 24 hours: Temp:  [98.6 F (37 C)] 98.6 F (37 C) (06/01 0849) Pulse Rate:  [60] 60 (06/01 0849) Resp:  [17] 17 (06/01 0849) BP: (117)/(47) 117/47 (06/01 0849) SpO2:  [98 %] 98 % (06/01 0849) Weight:  [91 kg] 91 kg (06/01 0849)  General Appearance: Alert, cooperative, no distress, appears stated age Head: Normocephalic, without obvious abnormality, atraumatic Eyes: PERRL, conjunctiva/corneas clear, EOM's intact    Neck: Supple, symmetrical, trachea midline Back: Symmetric, no curvature, ROM normal, no CVA tenderness Lungs:  respirations unlabored Heart: Regular rate and rhythm Abdomen: Soft, non-tender Extremities: Extremities normal, atraumatic, no cyanosis or edema Pulses: 2+ and symmetric all extremities Skin: Skin color, texture, turgor normal, no rashes or lesions  NEUROLOGIC:   Mental status: Alert and oriented x4,  no aphasia, good attention span, fund of knowledge, and memory Motor Exam - grossly normal Sensory Exam - grossly normal Reflexes: Trace Coordination - grossly normal Gait - grossly normal Balance - grossly normal Cranial  Nerves: I: smell Not tested  II: visual acuity  OS: nl    OD: nl  II: visual fields Full to confrontation  II: pupils Equal, round, reactive to light  III,VII: ptosis None  III,IV,VI: extraocular muscles  Full ROM  V: mastication Normal  V: facial light touch sensation  Normal  V,VII: corneal reflex  Present  VII: facial muscle function - upper  Normal  VII: facial muscle function - lower Normal  VIII: hearing Not tested  IX: soft palate elevation  Normal  IX,X: gag reflex Present  XI: trapezius strength  5/5  XI: sternocleidomastoid strength 5/5  XI: neck flexion strength  5/5  XII: tongue strength  Normal    Data Review Lab Results  Component Value Date   WBC 7.1 07/07/2020   HGB 10.3 (L) 07/07/2020   HCT 33.0 (L) 07/07/2020   MCV 88.5 07/07/2020   PLT 371 07/07/2020   Lab Results  Component Value Date   NA  134 (L) 07/07/2020   K 3.9 07/07/2020   CL 101 07/07/2020   CO2 25 07/07/2020   BUN 22 07/07/2020   CREATININE 1.06 (H) 07/07/2020   GLUCOSE 207 (H) 07/07/2020   Lab Results  Component Value Date   INR 1.1 07/14/2020    Assessment/Plan:  Estimated body mass index is 34.98 kg/m as calculated from the following:   Height as of this encounter: 5' 3.5" (1.613 m).   Weight as of this encounter: 91 kg. Patient admitted for left L4-5 and L3-4 decompressive hemilaminectomy with sublaminar decompression for spinal stenosis. Patient has failed a reasonable attempt at conservative therapy.  I explained the condition and procedure to the patient and answered any questions.  Patient wishes to proceed with procedure as planned. Understands risks/ benefits and typical outcomes of procedure.   Eustace Moore 07/14/2020 10:25 AM

## 2020-07-15 ENCOUNTER — Ambulatory Visit: Payer: Medicare Other | Admitting: Oncology

## 2020-07-15 ENCOUNTER — Other Ambulatory Visit: Payer: Medicare Other

## 2020-07-15 ENCOUNTER — Ambulatory Visit: Payer: Medicare Other

## 2020-07-15 ENCOUNTER — Encounter (HOSPITAL_COMMUNITY): Payer: Self-pay | Admitting: Neurological Surgery

## 2020-07-15 DIAGNOSIS — M4317 Spondylolisthesis, lumbosacral region: Secondary | ICD-10-CM | POA: Diagnosis not present

## 2020-07-15 DIAGNOSIS — Z87891 Personal history of nicotine dependence: Secondary | ICD-10-CM | POA: Diagnosis not present

## 2020-07-15 DIAGNOSIS — Z880 Allergy status to penicillin: Secondary | ICD-10-CM | POA: Diagnosis not present

## 2020-07-15 DIAGNOSIS — Z882 Allergy status to sulfonamides status: Secondary | ICD-10-CM | POA: Diagnosis not present

## 2020-07-15 DIAGNOSIS — Z91041 Radiographic dye allergy status: Secondary | ICD-10-CM | POA: Diagnosis not present

## 2020-07-15 DIAGNOSIS — Z888 Allergy status to other drugs, medicaments and biological substances status: Secondary | ICD-10-CM | POA: Diagnosis not present

## 2020-07-15 DIAGNOSIS — M4316 Spondylolisthesis, lumbar region: Secondary | ICD-10-CM | POA: Diagnosis not present

## 2020-07-15 DIAGNOSIS — Z7984 Long term (current) use of oral hypoglycemic drugs: Secondary | ICD-10-CM | POA: Diagnosis not present

## 2020-07-15 DIAGNOSIS — Z79899 Other long term (current) drug therapy: Secondary | ICD-10-CM | POA: Diagnosis not present

## 2020-07-15 DIAGNOSIS — Z7901 Long term (current) use of anticoagulants: Secondary | ICD-10-CM | POA: Diagnosis not present

## 2020-07-15 DIAGNOSIS — M48061 Spinal stenosis, lumbar region without neurogenic claudication: Secondary | ICD-10-CM | POA: Diagnosis not present

## 2020-07-15 DIAGNOSIS — M79605 Pain in left leg: Secondary | ICD-10-CM | POA: Diagnosis not present

## 2020-07-15 LAB — GLUCOSE, CAPILLARY: Glucose-Capillary: 221 mg/dL — ABNORMAL HIGH (ref 70–99)

## 2020-07-15 MED ORDER — METHOCARBAMOL 500 MG PO TABS: 500.0000 mg | ORAL_TABLET | Freq: Four times a day (QID) | ORAL | 0 refills | Status: DC

## 2020-07-15 MED ORDER — HYDROCODONE-ACETAMINOPHEN 5-325 MG PO TABS: 1.0000 | ORAL_TABLET | ORAL | 0 refills | Status: DC | PRN

## 2020-07-15 NOTE — Progress Notes (Signed)
Patient was transported via wheelchair by volunteer for discharge home; in no acute distress nor complaints of pain; all belongings checked and accounted for and were taken along by daughter on her way to her vehicle; discharge instructions given to patient and her daughter and both verbalized understanding on the instructions given.

## 2020-07-15 NOTE — Discharge Summary (Signed)
Physician Discharge Summary  Patient ID: Latasha Hodges MRN: [id number] DOB/AGE: 1946/02/02 75 y.o.  Admit date: 07/14/2020 Discharge date: 07/15/2020  Admission Diagnoses: lumbar stenosis   Discharge Diagnoses: same, s/p LL    Discharged Condition: good  Hospital Course: The patient was admitted on 07/14/2020 and taken to the operating room where the patient underwent LL L3-4 L4-5. The patient tolerated the procedure well and was taken to the recovery room and then to the floor in stable condition. The hospital course was routine. There were no complications. The wound remained clean dry and intact. Pt had appropriate back soreness. No complaints of leg pain or new N/T/W. The patient remained afebrile with stable vital signs, and tolerated a regular diet. The patient continued to increase activities, and pain was well controlled with oral pain medications.   Consults: None  Significant Diagnostic Studies:  Results for orders placed or performed during the hospital encounter of 07/14/20  Protime-INR  Result Value Ref Range   Prothrombin Time 14.6 11.4 - 15.2 seconds   INR 1.1 0.8 - 1.2  Glucose, capillary  Result Value Ref Range   Glucose-Capillary 173 (H) 70 - 99 mg/dL   Comment 1 Notify RN    Comment 2 Document in Chart   Glucose, capillary  Result Value Ref Range   Glucose-Capillary 162 (H) 70 - 99 mg/dL  Glucose, capillary  Result Value Ref Range   Glucose-Capillary 228 (H) 70 - 99 mg/dL  Glucose, capillary  Result Value Ref Range   Glucose-Capillary 264 (H) 70 - 99 mg/dL   Comment 1 Notify RN    Comment 2 Document in Chart   Glucose, capillary  Result Value Ref Range   Glucose-Capillary 221 (H) 70 - 99 mg/dL   Comment 1 Notify RN    Comment 2 Document in Chart     Chest 2 View  Result Date: 07/08/2020 CLINICAL DATA:  Preoperative chest x-ray. EXAM: CHEST - 2 VIEW COMPARISON:  CT chest 01/15/2019 FINDINGS: Cardiomediastinal silhouette is within normal limits. There  is no focal airspace disease. There is no large pleural effusion or visible pneumothorax. There are multilevel degenerative changes of the thoracic spine. No acute osseous abnormality. IMPRESSION: No evidence of acute cardiopulmonary disease. Electronically Signed   By: Maurine Simmering   On: 07/08/2020 14:33   DG Lumbar Spine 2-3 Views  Result Date: 07/14/2020 CLINICAL DATA:  Intraoperative image for localization. EXAM: LUMBAR SPINE - 2-3 VIEW COMPARISON:  Lumbar spine MRI 12/20/2019. Lumbar spine radiographs 03/09/2020. FINDINGS: Two lateral view intraoperative radiographs of the lumbar spine are submitted. Spinal numbering will remain consistent with that utilized on the prior lumbar spine MRI of 12/20/2019. There is transitional lumbosacral anatomy and the S1 vertebra is transitional. On the first lateral view radiograph acquired at 00:93 a.m., a metallic site marker projects posterior to the lumbosacral spine at the L5-S1 level. On the subsequent radiograph acquired at 11:42 a.m., surgical instruments and retractors project posterior to the lumbar spine at the L4 level. IMPRESSION: Two intraoperative localizer radiographs of the lumbosacral spine, as described. Electronically Signed   By: Kellie Simmering DO   On: 07/14/2020 13:33   IR IMAGING GUIDED PORT INSERTION  Result Date: 07/08/2020 INDICATION: History renal cell carcinoma. In need durable intravenous access for chemotherapy administration. EXAM: IMPLANTED PORT A CATH PLACEMENT WITH ULTRASOUND AND FLUOROSCOPIC GUIDANCE COMPARISON:  None. MEDICATIONS: None ANESTHESIA/SEDATION: Moderate (conscious) sedation was employed during this procedure. A total of Versed 2 mg and Fentanyl 100 mcg was administered  intravenously. Moderate Sedation Time: 20 minutes. The patient's level of consciousness and vital signs were monitored continuously by radiology nursing throughout the procedure under my direct supervision. CONTRAST:  None FLUOROSCOPY TIME:  24 seconds (7 mGy)  COMPLICATIONS: None immediate. PROCEDURE: The procedure, risks, benefits, and alternatives were explained to the patient. Questions regarding the procedure were encouraged and answered. The patient understands and consents to the procedure. The right neck and chest were prepped with chlorhexidine in a sterile fashion, and a sterile drape was applied covering the operative field. Maximum barrier sterile technique with sterile gowns and gloves were used for the procedure. A timeout was performed prior to the initiation of the procedure. Local anesthesia was provided with 1% lidocaine with epinephrine. After creating a small venotomy incision, a micropuncture kit was utilized to access the internal jugular vein. Real-time ultrasound guidance was utilized for vascular access including the acquisition of a permanent ultrasound image documenting patency of the accessed vessel. The microwire was utilized to measure appropriate catheter length. A subcutaneous port pocket was then created along the upper chest wall utilizing a combination of sharp and blunt dissection. The pocket was irrigated with sterile saline. A single lumen "Slim" sized power injectable port was chosen for placement. The 8 Fr catheter was tunneled from the port pocket site to the venotomy incision. The port was placed in the pocket. The external catheter was trimmed to appropriate length. At the venotomy, an 8 Fr peel-away sheath was placed over a guidewire under fluoroscopic guidance. The catheter was then placed through the sheath and the sheath was removed. Final catheter positioning was confirmed and documented with a fluoroscopic spot radiograph. The port was accessed with a Huber needle, aspirated and flushed with heparinized saline. The venotomy site was closed with an interrupted 4-0 Vicryl suture. The port pocket incision was closed with interrupted 2-0 Vicryl suture. Dermabond and Steri-strips were applied to both incisions. Dressings were  applied. The patient tolerated the procedure well without immediate post procedural complication. FINDINGS: After catheter placement, the tip lies within the superior cavoatrial junction. The catheter aspirates and flushes normally and is ready for immediate use. IMPRESSION: Successful placement of a right internal jugular approach power injectable Port-A-Cath. The catheter is ready for immediate use. Electronically Signed   By: Simonne Come M.D.   On: 07/08/2020 16:37    Antibiotics:  Anti-infectives (From admission, onward)   Start     Dose/Rate Route Frequency Ordered Stop   07/15/20 0100  vancomycin (VANCOREADY) IVPB 1250 mg/250 mL        1,250 mg 166.7 mL/hr over 90 Minutes Intravenous  Once 07/14/20 1501 07/15/20 0210   07/14/20 0845  vancomycin (VANCOCIN) IVPB 1000 mg/200 mL premix        1,000 mg 200 mL/hr over 60 Minutes Intravenous On call to O.R. 07/14/20 0841 07/14/20 1032      Discharge Exam: Blood pressure (!) 104/59, pulse (!) 50, temperature 97.6 F (36.4 C), temperature source Oral, resp. rate 16, height 5' 3.5" (1.613 m), weight 91 kg, SpO2 100 %. Neurologic: Grossly normal Dressing dry  Discharge Medications:   Allergies as of 07/15/2020      Reactions   Iodinated Diagnostic Agents Hives, Swelling, Rash   No [employer]alt[employer] Food Allergies Rash   Seafood   Penicillins Rash   Childhood reaction.   Sulfa Antibiotics Hives, Rash   Sulfa Drugs Cross Reactors Rash   Childhood reaction.   Theophyllines Rash      Medication List  STOP taking these medications   Clotrimazole-Betameth & Zn Ox 1-0.05 & 20 % Thpk     TAKE these medications   clotrimazole-betamethasone cream Commonly known as: LOTRISONE Apply 1 application topically 2 (two) times daily. May use with OTC zinc oxide cream.   fenofibrate micronized 200 MG capsule Commonly known as: LOFIBRA TAKE 1 CAPSULE BY MOUTH DAILY BEFORE BREAKFAST What changed: See the new instructions.   flecainide 50 MG  tablet Commonly known as: TAMBOCOR TAKE 1 TABLET(50 MG) BY MOUTH TWICE DAILY What changed:   how much to take  how to take this  when to take this  additional instructions   HYDROcodone-acetaminophen 5-325 MG tablet Commonly known as: NORCO/VICODIN Take 1 tablet by mouth every 4 (four) hours as needed for moderate pain. What changed:   how much to take  how to take this  when to take this  reasons to take this   Keytruda 100 MG/4ML Soln Generic drug: pembrolizumab Inject 400 mg into the vein every 6 (six) weeks.   lisinopril 20 MG tablet Commonly known as: ZESTRIL TAKE 1 TABLET(20 MG) BY MOUTH DAILY What changed: See the new instructions.   magnesium oxide 400 MG tablet Commonly known as: MAG-OX Take 800 mg by mouth at bedtime.   metFORMIN 1000 MG tablet Commonly known as: GLUCOPHAGE Take 1,000 mg by mouth 2 (two) times daily.   methocarbamol 500 MG tablet Commonly known as: Robaxin Take 1 tablet (500 mg total) by mouth 4 (four) times daily.   metoprolol succinate 50 MG 24 hr tablet Commonly known as: TOPROL-XL TAKE 1 TABLET(50 MG) BY MOUTH DAILY What changed: See the new instructions.   prochlorperazine 10 MG tablet Commonly known as: COMPAZINE Take 1 tablet (10 mg total) by mouth every 6 (six) hours as needed for nausea or vomiting.   simvastatin 40 MG tablet Commonly known as: ZOCOR TAKE 1 TABLET(40 MG) BY MOUTH DAILY AT 6 PM What changed: See the new instructions.   Trulicity 1.5 TG/6.2IR Sopn Generic drug: Dulaglutide Inject 1.5 mg into the skin every Thursday.   warfarin 5 MG tablet Commonly known as: COUMADIN Take as directed. If you are unsure how to take this medication, talk to your nurse or doctor. Original instructions: TAKE BY MOUTH AS DIRECTED EVERY DAY What changed: See the new instructions.       Disposition: home   Final Dx: lumbar laminectomy L3-4 L4-5  Discharge Instructions     Remove dressing in 72 hours   Complete  by: As directed    Call MD for:  difficulty breathing, headache or visual disturbances   Complete by: As directed    Call MD for:  hives   Complete by: As directed    Call MD for:  persistant nausea and vomiting   Complete by: As directed    Call MD for:  redness, tenderness, or signs of infection (pain, swelling, redness, odor or green/yellow discharge around incision site)   Complete by: As directed    Call MD for:  severe uncontrolled pain   Complete by: As directed    Call MD for:  temperature >100.4   Complete by: As directed    Diet - low sodium heart healthy   Complete by: As directed    Driving Restrictions   Complete by: As directed    No driving for 2 weeks, no riding in the car for 1 week   Increase activity slowly   Complete by: As directed    Lifting  restrictions   Complete by: As directed    No lifting more than 8 lbs         Signed: Eustace Moore 07/15/2020, 8:00 AM

## 2020-07-23 ENCOUNTER — Other Ambulatory Visit: Payer: Self-pay

## 2020-07-23 ENCOUNTER — Other Ambulatory Visit (HOSPITAL_COMMUNITY): Payer: Self-pay | Admitting: Neurological Surgery

## 2020-07-23 ENCOUNTER — Ambulatory Visit: Payer: Medicare Other | Admitting: *Deleted

## 2020-07-23 DIAGNOSIS — Z5181 Encounter for therapeutic drug level monitoring: Secondary | ICD-10-CM | POA: Diagnosis not present

## 2020-07-23 DIAGNOSIS — I480 Paroxysmal atrial fibrillation: Secondary | ICD-10-CM

## 2020-07-23 DIAGNOSIS — N2889 Other specified disorders of kidney and ureter: Secondary | ICD-10-CM

## 2020-07-23 DIAGNOSIS — Z7189 Other specified counseling: Secondary | ICD-10-CM

## 2020-07-23 DIAGNOSIS — I4819 Other persistent atrial fibrillation: Secondary | ICD-10-CM

## 2020-07-23 LAB — POCT INR: INR: 1.4 — AB (ref 2.0–3.0)

## 2020-07-23 NOTE — Patient Instructions (Signed)
Description   Today take 1.5 tablets and tomorrow take 1 tablet then resume taking 1/2 tablet every day except 1 tablet on Mondays, Wednesdays, and Fridays. Recheck INR 1 week. Call our office if you have any questions or if placed on any new medications [phone].

## 2020-07-30 ENCOUNTER — Ambulatory Visit (INDEPENDENT_AMBULATORY_CARE_PROVIDER_SITE_OTHER): Payer: Medicare Other | Admitting: Pharmacist

## 2020-07-30 ENCOUNTER — Other Ambulatory Visit: Payer: Self-pay

## 2020-07-30 DIAGNOSIS — Z7189 Other specified counseling: Secondary | ICD-10-CM

## 2020-07-30 DIAGNOSIS — Z5181 Encounter for therapeutic drug level monitoring: Secondary | ICD-10-CM

## 2020-07-30 DIAGNOSIS — I4819 Other persistent atrial fibrillation: Secondary | ICD-10-CM

## 2020-07-30 DIAGNOSIS — I480 Paroxysmal atrial fibrillation: Secondary | ICD-10-CM

## 2020-07-30 LAB — POCT INR: INR: 2 (ref 2.0–3.0)

## 2020-07-30 NOTE — Patient Instructions (Signed)
Start taking 1 tablet every day except 1/2 tablet on Tues, Thurs, Sat. Keep greens consistent (2 salads) Recheck INR 2 week. Call our office if you have any questions or if placed on any new medications [phone].

## 2020-07-30 NOTE — Progress Notes (Signed)
Pt wants to eat more salads

## 2020-08-04 ENCOUNTER — Inpatient Hospital Stay: Payer: Medicare Other

## 2020-08-04 ENCOUNTER — Other Ambulatory Visit: Payer: Self-pay

## 2020-08-04 ENCOUNTER — Inpatient Hospital Stay (HOSPITAL_BASED_OUTPATIENT_CLINIC_OR_DEPARTMENT_OTHER): Payer: Medicare Other | Admitting: Oncology

## 2020-08-04 ENCOUNTER — Inpatient Hospital Stay: Payer: Medicare Other | Attending: Oncology

## 2020-08-04 DIAGNOSIS — D49511 Neoplasm of unspecified behavior of right kidney: Secondary | ICD-10-CM

## 2020-08-04 DIAGNOSIS — Z5112 Encounter for antineoplastic immunotherapy: Secondary | ICD-10-CM | POA: Insufficient documentation

## 2020-08-04 DIAGNOSIS — Z79899 Other long term (current) drug therapy: Secondary | ICD-10-CM | POA: Diagnosis not present

## 2020-08-04 DIAGNOSIS — Z905 Acquired absence of kidney: Secondary | ICD-10-CM | POA: Diagnosis not present

## 2020-08-04 DIAGNOSIS — C641 Malignant neoplasm of right kidney, except renal pelvis: Secondary | ICD-10-CM | POA: Insufficient documentation

## 2020-08-04 LAB — CMP (CANCER CENTER ONLY)
ALT: 15 U/L (ref 0–44)
AST: 19 U/L (ref 15–41)
Albumin: 3.3 g/dL — ABNORMAL LOW (ref 3.5–5.0)
Alkaline Phosphatase: 69 U/L (ref 38–126)
Anion gap: 10 (ref 5–15)
BUN: 24 mg/dL — ABNORMAL HIGH (ref 8–23)
CO2: 24 mmol/L (ref 22–32)
Calcium: 9.5 mg/dL (ref 8.9–10.3)
Chloride: 105 mmol/L (ref 98–111)
Creatinine: 1.25 mg/dL — ABNORMAL HIGH (ref 0.44–1.00)
GFR, Estimated: 46 mL/min — ABNORMAL LOW (ref >60)
Glucose, Bld: 187 mg/dL — ABNORMAL HIGH (ref 70–99)
Potassium: 4.4 mmol/L (ref 3.5–5.1)
Sodium: 139 mmol/L (ref 135–145)
Total Bilirubin: 0.3 mg/dL (ref 0.3–1.2)
Total Protein: 7.2 g/dL (ref 6.5–8.1)

## 2020-08-04 LAB — TSH: TSH: 35.421 u[IU]/mL — ABNORMAL HIGH (ref 0.308–3.960)

## 2020-08-04 LAB — CBC WITH DIFFERENTIAL (CANCER CENTER ONLY)
Abs Immature Granulocytes: 0.08 10*3/uL — ABNORMAL HIGH (ref 0.00–0.07)
Basophils Absolute: 0.1 10*3/uL (ref 0.0–0.1)
Basophils Relative: 1 %
Eosinophils Absolute: 0.4 10*3/uL (ref 0.0–0.5)
Eosinophils Relative: 7 %
HCT: 31.4 % — ABNORMAL LOW (ref 36.0–46.0)
Hemoglobin: 10.1 g/dL — ABNORMAL LOW (ref 12.0–15.0)
Immature Granulocytes: 1 %
Lymphocytes Relative: 21 %
Lymphs Abs: 1.4 10*3/uL (ref 0.7–4.0)
MCH: 27.4 pg (ref 26.0–34.0)
MCHC: 32.2 g/dL (ref 30.0–36.0)
MCV: 85.1 fL (ref 80.0–100.0)
Monocytes Absolute: 0.7 10*3/uL (ref 0.1–1.0)
Monocytes Relative: 11 %
Neutro Abs: 3.9 10*3/uL (ref 1.7–7.7)
Neutrophils Relative %: 59 %
Platelet Count: 347 10*3/uL (ref 150–400)
RBC: 3.69 MIL/uL — ABNORMAL LOW (ref 3.87–5.11)
RDW: 14.7 % (ref 11.5–15.5)
WBC Count: 6.6 10*3/uL (ref 4.0–10.5)
nRBC: 0 % (ref 0.0–0.2)

## 2020-08-04 MED ORDER — LIDOCAINE-PRILOCAINE 2.5-2.5 % EX CREA: 1.0000 "application " | TOPICAL_CREAM | CUTANEOUS | 0 refills | Status: DC | PRN

## 2020-08-04 MED ORDER — HYDROXYZINE HCL 10 MG PO TABS
10.0000 mg | ORAL_TABLET | Freq: Three times a day (TID) | ORAL | 0 refills | Status: DC | PRN
Start: 2020-08-04 — End: 2021-04-18

## 2020-08-04 MED ORDER — TRIAMCINOLONE ACETONIDE 0.5 % EX OINT: 1.0000 "application " | TOPICAL_OINTMENT | Freq: Two times a day (BID) | CUTANEOUS | 0 refills | Status: DC

## 2020-08-04 NOTE — Progress Notes (Addendum)
..  The following Medication: Beryle Flock has been approved thru [employer] as Risk manager. Enrollment period is 08/02/2020 to 02/12/2021.  Assistance ID: [id number]. Reason for Assistance: EOOP First DOS: 08/09/2020.

## 2020-08-04 NOTE — Progress Notes (Signed)
Hematology and Oncology Follow Up Visit  Latasha Hodges [id number] January 07, 1946 75 y.o. 08/04/2020 12:33 PM Latasha Hodges, MDGriffin, Latasha Sheffield, MD   Principle Diagnosis: 75 year old woman kidney cancer diagnosed in December 2021.  She was found to have T3aN1 clear-cell histology.   Prior Therapy:   She is status post right robotic assisted laparoscopic radical nephrectomy completed by Dr. Alinda Money on January 29, 2020.  The final pathology showed a 7.0 cm clear cell renal cell carcinoma with tumor extends into the renal vein, renal capsule and renal sinus fat indicating T3a disease with 1 lymph node involvement.  No sarcomatoid or rhabdoid features noted with histological grade of G3.  Current therapy:  Pembrolizumab 200 mg every 3 weeks started on March 31, 2020.  She received 400 mg starting with cycle 5 of therapy in May 2022.  Interim History: Latasha Hodges returns today for a follow-up visit.  Since the last visit, she reports no major changes in her health.  She underwent lumbar spine surgery which she has tolerated very well without any complications.  He is ambulating without any difficulties or falls.  She has reported more itching associated with treatments but no significant rash.  She denies any recent falls or syncope.  Follow status activity level remain excellent.    Medications: Updated on review. Current Outpatient Medications  Medication Sig Dispense Refill   clotrimazole-betamethasone (LOTRISONE) cream Apply 1 application topically 2 (two) times daily. May use with OTC zinc oxide cream. 135 g 0   Dulaglutide (TRULICITY) 1.5 GH/8.2XH SOPN Inject 1.5 mg into the skin every Thursday.     fenofibrate micronized (LOFIBRA) 200 MG capsule TAKE 1 CAPSULE BY MOUTH DAILY BEFORE BREAKFAST (Patient taking differently: Take 200 mg by mouth daily before breakfast.) 90 capsule 2   flecainide (TAMBOCOR) 50 MG tablet TAKE 1 TABLET(50 MG) BY MOUTH TWICE DAILY (Patient taking differently:  Take 50 mg by mouth 2 (two) times daily.) 180 tablet 3   HYDROcodone-acetaminophen (NORCO/VICODIN) 5-325 MG tablet Take 1 tablet by mouth every 4 (four) hours as needed for moderate pain. 20 tablet 0   lisinopril (ZESTRIL) 20 MG tablet TAKE 1 TABLET(20 MG) BY MOUTH DAILY (Patient taking differently: Take 20 mg by mouth daily.) 90 tablet 3   magnesium oxide (MAG-OX) 400 MG tablet Take 800 mg by mouth at bedtime.     metFORMIN (GLUCOPHAGE) 1000 MG tablet Take 1,000 mg by mouth 2 (two) times daily.  0   methocarbamol (ROBAXIN) 500 MG tablet Take 1 tablet (500 mg total) by mouth 4 (four) times daily. 45 tablet 0   metoprolol succinate (TOPROL-XL) 50 MG 24 hr tablet TAKE 1 TABLET(50 MG) BY MOUTH DAILY (Patient taking differently: Take 50 mg by mouth daily.) 90 tablet 3   pembrolizumab (KEYTRUDA) 100 MG/4ML SOLN Inject 400 mg into the vein every 6 (six) weeks.     prochlorperazine (COMPAZINE) 10 MG tablet Take 1 tablet (10 mg total) by mouth every 6 (six) hours as needed for nausea or vomiting. 30 tablet 0   simvastatin (ZOCOR) 40 MG tablet TAKE 1 TABLET(40 MG) BY MOUTH DAILY AT 6 PM (Patient taking differently: Take 40 mg by mouth daily at 6 PM.) 90 tablet 3   warfarin (COUMADIN) 5 MG tablet TAKE BY MOUTH AS DIRECTED EVERY DAY (Patient taking differently: Take 2.5-5 mg by mouth See admin instructions. Take 5 mg by mouth on Monday, Wednesday and Friday, take 2.5 mg on Tuesday, Thursday, Saturday and Sunday) 90 tablet 1   No current facility-administered  medications for this visit.     Allergies:  Allergies  Allergen Reactions   Iodinated Diagnostic Agents Hives, Swelling and Rash   No Healthtouch Food Allergies Rash    Seafood   Penicillins Rash    Childhood reaction.   Sulfa Antibiotics Hives and Rash   Sulfa Drugs Cross Reactors Rash    Childhood reaction.   Theophyllines Rash      Physical Exam:       ECOG: 1    General appearance: Alert, awake without any distress. Head:  Atraumatic without abnormalities Oropharynx: Without any thrush or ulcers. Eyes: No scleral icterus. Lymph nodes: No lymphadenopathy noted in the cervical, supraclavicular, or axillary nodes Heart:regular rate and rhythm, without any murmurs or gallops.   Lung: Clear to auscultation without any rhonchi, wheezes or dullness to percussion. Abdomin: Soft, nontender without any shifting dullness or ascites. Musculoskeletal: No clubbing or cyanosis. Neurological: No motor or sensory deficits. Skin: No rashes or lesions.         Lab Results: Lab Results  Component Value Date   WBC 7.1 07/07/2020   HGB 10.3 (L) 07/07/2020   HCT 33.0 (L) 07/07/2020   MCV 88.5 07/07/2020   PLT 371 07/07/2020     Chemistry      Component Value Date/Time   NA 134 (L) 07/07/2020 1327   NA 139 08/20/2017 1147   K 3.9 07/07/2020 1327   CL 101 07/07/2020 1327   CO2 25 07/07/2020 1327   BUN 22 07/07/2020 1327   BUN 18 08/20/2017 1147   CREATININE 1.06 (H) 07/07/2020 1327   CREATININE 1.10 (H) 06/23/2020 1233      Component Value Date/Time   CALCIUM 9.2 07/07/2020 1327   ALKPHOS 83 06/23/2020 1233   AST 26 06/23/2020 1233   ALT 25 06/23/2020 1233   BILITOT 0.3 06/23/2020 1233          Impression and Plan:   75 year old woman with:   1.   Kidney cancer diagnosed in December 2021.  She was found to have T3a N1 clear cell renal cell involving the right kidney.  Her disease status was updated at this time and treatment options were discussed.  Complications associated with these treatments were reviewed.  Dermatological toxicities including pruritus, GI complaints as well as immune mediated issues were reiterated.  Alternative treatment options would be active surveillance and initiating treatment upon disease progression.   She is agreeable to continue at this time.  Her next infusion will be scheduled next week due to drug availability and coverage.    2.  IV access: Port-A-Cath inserted  without any issues.  Emla cream will be available to her.   3.  Antiemetics: Compazine is available to her without any nausea or vomiting.   4.  Immune mediated complications: I continue to educate her about potential complication including pneumonitis, colitis and thyroid disease.  5.  Lumbar stenosis of L3 and L4 as well as L4-L5: She is status post laminectomy on June 1 without any complications.   6.  Follow-up: She will return next week to receive treatment and will be repeated in 6 weeks after that.   30  minutes were dedicated to this encounter.  The time spent on reviewing laboratory data, disease status update, treatment choices and complications of therapy.  Zola Button, MD 6/22/202212:33 PM

## 2020-08-06 ENCOUNTER — Telehealth: Payer: Self-pay | Admitting: Oncology

## 2020-08-06 ENCOUNTER — Encounter: Payer: Self-pay | Admitting: Oncology

## 2020-08-06 NOTE — Telephone Encounter (Signed)
Scheduled per 06/22 los, patient has been called and notified of upcoming appointments.

## 2020-08-11 ENCOUNTER — Inpatient Hospital Stay: Payer: Medicare Other

## 2020-08-11 ENCOUNTER — Other Ambulatory Visit: Payer: Self-pay

## 2020-08-11 ENCOUNTER — Other Ambulatory Visit: Payer: Medicare Other

## 2020-08-11 VITALS — BP 130/71 | HR 60 | Temp 99.4°F | Resp 18 | Wt 201.3 lb

## 2020-08-11 DIAGNOSIS — Z5112 Encounter for antineoplastic immunotherapy: Secondary | ICD-10-CM | POA: Diagnosis not present

## 2020-08-11 DIAGNOSIS — D49511 Neoplasm of unspecified behavior of right kidney: Secondary | ICD-10-CM

## 2020-08-11 DIAGNOSIS — Z79899 Other long term (current) drug therapy: Secondary | ICD-10-CM | POA: Diagnosis not present

## 2020-08-11 DIAGNOSIS — C641 Malignant neoplasm of right kidney, except renal pelvis: Secondary | ICD-10-CM | POA: Diagnosis not present

## 2020-08-11 DIAGNOSIS — Z905 Acquired absence of kidney: Secondary | ICD-10-CM | POA: Diagnosis not present

## 2020-08-11 LAB — CBC WITH DIFFERENTIAL (CANCER CENTER ONLY)
Abs Immature Granulocytes: 0.04 10*3/uL (ref 0.00–0.07)
Basophils Absolute: 0 10*3/uL (ref 0.0–0.1)
Basophils Relative: 1 %
Eosinophils Absolute: 0.5 10*3/uL (ref 0.0–0.5)
Eosinophils Relative: 9 %
HCT: 30.2 % — ABNORMAL LOW (ref 36.0–46.0)
Hemoglobin: 9.6 g/dL — ABNORMAL LOW (ref 12.0–15.0)
Immature Granulocytes: 1 %
Lymphocytes Relative: 24 %
Lymphs Abs: 1.4 10*3/uL (ref 0.7–4.0)
MCH: 27.1 pg (ref 26.0–34.0)
MCHC: 31.8 g/dL (ref 30.0–36.0)
MCV: 85.3 fL (ref 80.0–100.0)
Monocytes Absolute: 0.6 10*3/uL (ref 0.1–1.0)
Monocytes Relative: 9 %
Neutro Abs: 3.4 10*3/uL (ref 1.7–7.7)
Neutrophils Relative %: 56 %
Platelet Count: 263 10*3/uL (ref 150–400)
RBC: 3.54 MIL/uL — ABNORMAL LOW (ref 3.87–5.11)
RDW: 14.7 % (ref 11.5–15.5)
WBC Count: 6 10*3/uL (ref 4.0–10.5)
nRBC: 0 % (ref 0.0–0.2)

## 2020-08-11 LAB — CMP (CANCER CENTER ONLY)
ALT: 13 U/L (ref 0–44)
AST: 17 U/L (ref 15–41)
Albumin: 2.9 g/dL — ABNORMAL LOW (ref 3.5–5.0)
Alkaline Phosphatase: 66 U/L (ref 38–126)
Anion gap: 10 (ref 5–15)
BUN: 24 mg/dL — ABNORMAL HIGH (ref 8–23)
CO2: 22 mmol/L (ref 22–32)
Calcium: 9.1 mg/dL (ref 8.9–10.3)
Chloride: 105 mmol/L (ref 98–111)
Creatinine: 1.24 mg/dL — ABNORMAL HIGH (ref 0.44–1.00)
GFR, Estimated: 46 mL/min — ABNORMAL LOW (ref >60)
Glucose, Bld: 231 mg/dL — ABNORMAL HIGH (ref 70–99)
Potassium: 4.3 mmol/L (ref 3.5–5.1)
Sodium: 137 mmol/L (ref 135–145)
Total Bilirubin: 0.3 mg/dL (ref 0.3–1.2)
Total Protein: 6.9 g/dL (ref 6.5–8.1)

## 2020-08-11 LAB — TSH: TSH: 28.449 u[IU]/mL — ABNORMAL HIGH (ref 0.308–3.960)

## 2020-08-11 MED ORDER — SODIUM CHLORIDE 0.9 % IV SOLN
400.0000 mg | Freq: Once | INTRAVENOUS | Status: AC
  Administered 2020-08-11: 400 mg via INTRAVENOUS
  Filled 2020-08-11: qty 16

## 2020-08-11 MED ORDER — HEPARIN SOD (PORK) LOCK FLUSH 100 UNIT/ML IV SOLN
500.0000 [IU] | Freq: Once | INTRAVENOUS | Status: AC | PRN
  Administered 2020-08-11: 500 [IU]
  Filled 2020-08-11: qty 5

## 2020-08-11 MED ORDER — DIPHENHYDRAMINE HCL 50 MG/ML IJ SOLN
INTRAMUSCULAR | Status: AC
  Filled 2020-08-11: qty 1

## 2020-08-11 MED ORDER — FAMOTIDINE 20 MG IN NS 100 ML IVPB
INTRAVENOUS | Status: AC
  Filled 2020-08-11: qty 100

## 2020-08-11 MED ORDER — SODIUM CHLORIDE 0.9 % IV SOLN
Freq: Once | INTRAVENOUS | Status: AC
Start: 2020-08-11 — End: 2020-08-11
  Filled 2020-08-11: qty 250

## 2020-08-11 MED ORDER — SODIUM CHLORIDE 0.9% FLUSH
10.0000 mL | INTRAVENOUS | Status: DC | PRN
  Administered 2020-08-11: 10 mL
  Filled 2020-08-11: qty 10

## 2020-08-11 NOTE — Patient Instructions (Signed)
[facility]  Discharge Instructions: Thank you for choosing [facility] to provide your oncology and hematology care.   If you have a lab appointment with the Cancer Center, please go directly to the Cancer Center and check in at the registration area.   Wear comfortable clothing and clothing appropriate for easy access to any Portacath or PICC line.   We strive to give you quality time with your provider. You may need to reschedule your appointment if you arrive late (15 or more minutes).  Arriving late affects you and other patients whose appointments are after yours.  Also, if you miss three or more appointments without notifying the office, you may be dismissed from the clinic at the provider's discretion.      For prescription refill requests, have your pharmacy contact our office and allow 72 hours for refills to be completed.    Today you received the following chemotherapy and/or immunotherapy agents: keytruda      To help prevent nausea and vomiting after your treatment, we encourage you to take your nausea medication as directed.  BELOW ARE SYMPTOMS THAT SHOULD BE REPORTED IMMEDIATELY: *FEVER GREATER THAN 100.4 F (38 C) OR HIGHER *CHILLS OR SWEATING *NAUSEA AND VOMITING THAT IS NOT CONTROLLED WITH YOUR NAUSEA MEDICATION *UNUSUAL SHORTNESS OF BREATH *UNUSUAL BRUISING OR BLEEDING *URINARY PROBLEMS (pain or burning when urinating, or frequent urination) *BOWEL PROBLEMS (unusual diarrhea, constipation, pain near the anus) TENDERNESS IN MOUTH AND THROAT WITH OR WITHOUT PRESENCE OF ULCERS (sore throat, sores in mouth, or a toothache) UNUSUAL RASH, SWELLING OR PAIN  UNUSUAL VAGINAL DISCHARGE OR ITCHING   Items with * indicate a potential emergency and should be followed up as soon as possible or go to the Emergency Department if any problems should occur.  Please show the CHEMOTHERAPY ALERT CARD or IMMUNOTHERAPY ALERT CARD at check-in to  the Emergency Department and triage nurse.  Should you have questions after your visit or need to cancel or reschedule your appointment, please contact [facility]  Dept: [phone]  and follow the prompts.  Office hours are 8:00 a.m. to 4:30 p.m. Monday - Friday. Please note that voicemails left after 4:00 p.m. may not be returned until the following business day.  We are closed weekends and major holidays. You have access to a nurse at all times for urgent questions. Please call the main number to the clinic Dept: [phone] and follow the prompts.   For any non-urgent questions, you may also contact your provider using MyChart. We now offer e-Visits for anyone 18 and older to request care online for non-urgent symptoms. For details visit [email or web address].   Also download the MyChart app! Go to the app store, search "MyChart", open the app, select [facility], and log in with your MyChart username and password.  Due to Covid, a mask is required upon entering the hospital/clinic. If you do not have a mask, one will be given to you upon arrival. For doctor visits, patients may have 1 support person aged 18 or older with them. For treatment visits, patients cannot have anyone with them due to current Covid guidelines and our immunocompromised population.   

## 2020-08-11 NOTE — Progress Notes (Signed)
Ok to use labs from 08/04/20 per Dr.Shadad

## 2020-08-13 ENCOUNTER — Ambulatory Visit (INDEPENDENT_AMBULATORY_CARE_PROVIDER_SITE_OTHER): Payer: Medicare Other

## 2020-08-13 ENCOUNTER — Other Ambulatory Visit: Payer: Self-pay

## 2020-08-13 DIAGNOSIS — Z7189 Other specified counseling: Secondary | ICD-10-CM

## 2020-08-13 DIAGNOSIS — I4819 Other persistent atrial fibrillation: Secondary | ICD-10-CM

## 2020-08-13 DIAGNOSIS — Z5181 Encounter for therapeutic drug level monitoring: Secondary | ICD-10-CM

## 2020-08-13 DIAGNOSIS — I480 Paroxysmal atrial fibrillation: Secondary | ICD-10-CM

## 2020-08-13 LAB — POCT INR: INR: 2 (ref 2.0–3.0)

## 2020-08-13 NOTE — Patient Instructions (Signed)
Description   Take 1.5 tablets today then resume same dosage 1 tablet daily except 1/2 tablet on Tuesdays, Thursdays, and Saturdays. Keep greens consistent (2 salads) Recheck INR 3 weeks. Call our office if you have any questions or if placed on any new medications [phone].

## 2020-08-17 ENCOUNTER — Telehealth: Payer: Self-pay | Admitting: Oncology

## 2020-08-17 NOTE — Telephone Encounter (Signed)
R/s appt times per 7/5 sch msg. Called pt, no answer. Left msg with appts times.

## 2020-09-02 ENCOUNTER — Other Ambulatory Visit: Payer: Self-pay | Admitting: Oncology

## 2020-09-02 ENCOUNTER — Telehealth: Payer: Self-pay | Admitting: *Deleted

## 2020-09-02 DIAGNOSIS — D49511 Neoplasm of unspecified behavior of right kidney: Secondary | ICD-10-CM

## 2020-09-02 MED ORDER — METHYLPREDNISOLONE 4 MG PO TBPK: ORAL_TABLET | ORAL | 0 refills | Status: DC

## 2020-09-02 NOTE — Telephone Encounter (Signed)
PC to patient, informed her rx for magic mouthwash has been called in to [facility] on [address], [place].  Pt verbalizes understanding.

## 2020-09-02 NOTE — Telephone Encounter (Signed)
-----   Message from Wyatt Portela, MD sent at 09/02/2020  3:00 PM EDT ----- Yes. Thanks ----- Message ----- From: Rolene Course, RN Sent: 09/02/2020   2:57 PM EDT To: Wyatt Portela, MD  OK.  She was asking about magic mouthwash.  Would that be appropriate?  Would you like me to send a rx?    ----- Message ----- From: Wyatt Portela, MD Sent: 09/02/2020   2:49 PM EDT To: Rolene Course, RN  I don't have any other solutions at this point. We can stop the treatment which we can discuss next visit.  ----- Message ----- From: Rolene Course, RN Sent: 09/02/2020  10:44 AM EDT To: Wyatt Portela, MD  I spoke with the patient, she doesn't want to take prednisone because of her diabetes.  ----- Message ----- From: Wyatt Portela, MD Sent: 09/02/2020  10:33 AM EDT To: Rolene Course, RN  I will send Rx for prednisone which will help with her symptoms. CT scan will be done before her next visit in 09/2020.  ----- Message ----- From: Rolene Course, RN Sent: 09/02/2020  10:01 AM EDT To: Wyatt Portela, MD  This patient called this morning, she is having some issues - her gums are swollen & bleeding and her mouth is very tender, she said this has been slowly progressing.  Also she said her rash & itching are worse, she doesn't think her creams are working.  She also had an appointment with her surgeon, Dr. Alinda Money, he thought she should have imaging done 5-6 months after her surgery & evidently thought you would be ordering this but I see that Sherley Bounds ordered a CT back in June (not sure who this is).  Please advise. Thanks, Bethena Roys

## 2020-09-03 ENCOUNTER — Other Ambulatory Visit: Payer: Self-pay

## 2020-09-03 ENCOUNTER — Ambulatory Visit (INDEPENDENT_AMBULATORY_CARE_PROVIDER_SITE_OTHER): Payer: Medicare Other

## 2020-09-03 DIAGNOSIS — I4819 Other persistent atrial fibrillation: Secondary | ICD-10-CM

## 2020-09-03 DIAGNOSIS — I480 Paroxysmal atrial fibrillation: Secondary | ICD-10-CM | POA: Diagnosis not present

## 2020-09-03 DIAGNOSIS — Z7189 Other specified counseling: Secondary | ICD-10-CM

## 2020-09-03 DIAGNOSIS — Z5181 Encounter for therapeutic drug level monitoring: Secondary | ICD-10-CM | POA: Diagnosis not present

## 2020-09-03 LAB — POCT INR: INR: 2.1 (ref 2.0–3.0)

## 2020-09-03 NOTE — Patient Instructions (Signed)
Description   Continue on same dosage 1 tablet daily except 1/2 tablet on Tuesdays, Thursdays, and Saturdays. Keep greens consistent (2 salads) Recheck INR 4 weeks. Call our office if you have any questions or if placed on any new medications [phone].

## 2020-09-13 ENCOUNTER — Ambulatory Visit (HOSPITAL_COMMUNITY)
Admission: RE | Admit: 2020-09-13 | Discharge: 2020-09-13 | Disposition: A | Discharge status: Home / Self Care | Payer: Medicare Other | Source: Ambulatory Visit | Attending: Oncology | Admitting: Oncology

## 2020-09-13 ENCOUNTER — Other Ambulatory Visit: Payer: Self-pay

## 2020-09-13 DIAGNOSIS — D49511 Neoplasm of unspecified behavior of right kidney: Secondary | ICD-10-CM | POA: Insufficient documentation

## 2020-09-13 DIAGNOSIS — J439 Emphysema, unspecified: Secondary | ICD-10-CM | POA: Diagnosis not present

## 2020-09-13 DIAGNOSIS — I700 Atherosclerosis of aorta: Secondary | ICD-10-CM | POA: Diagnosis not present

## 2020-09-13 DIAGNOSIS — C641 Malignant neoplasm of right kidney, except renal pelvis: Secondary | ICD-10-CM | POA: Diagnosis not present

## 2020-09-13 DIAGNOSIS — R911 Solitary pulmonary nodule: Secondary | ICD-10-CM | POA: Diagnosis not present

## 2020-09-13 DIAGNOSIS — R918 Other nonspecific abnormal finding of lung field: Secondary | ICD-10-CM | POA: Diagnosis not present

## 2020-09-15 ENCOUNTER — Telehealth: Payer: Self-pay | Admitting: *Deleted

## 2020-09-15 NOTE — Telephone Encounter (Signed)
Patient stated she knew Dr. Alen Blew was out the office this week, but asked if this information would be given to him on his return.   Patient has multiple symptoms/concerns: itching w/welts an hives; poor sleep due to itching; gums swollen - bleed easily; loose teeth; problems eating rough textured food due to mouth pain; yeast in navel.  Has not taken prednisone prescribed in July because she said it will affect her blood sugar. Taking Benadryl for itching, but not much help.  She wants to discuss symptoms/concerns with Dr. Alen Blew prior to next scheduled infusion on 8/10 because she isn't sure she wants to have it. Advised her that appt with MD is before infusion and she can talk with MD then. She verbalized understanding.  Patient informed that all information will be given to Dr.Shadad  - message routed to MD and desk support staff

## 2020-09-22 ENCOUNTER — Inpatient Hospital Stay: Payer: Medicare Other | Attending: Oncology

## 2020-09-22 ENCOUNTER — Other Ambulatory Visit: Payer: Self-pay

## 2020-09-22 ENCOUNTER — Other Ambulatory Visit: Payer: Medicare Other

## 2020-09-22 ENCOUNTER — Inpatient Hospital Stay: Payer: Medicare Other

## 2020-09-22 ENCOUNTER — Ambulatory Visit: Payer: Medicare Other

## 2020-09-22 ENCOUNTER — Ambulatory Visit: Payer: Medicare Other | Admitting: Oncology

## 2020-09-22 ENCOUNTER — Inpatient Hospital Stay (HOSPITAL_BASED_OUTPATIENT_CLINIC_OR_DEPARTMENT_OTHER): Payer: Medicare Other | Admitting: Oncology

## 2020-09-22 VITALS — BP 158/68 | HR 59 | Temp 97.8°F | Resp 18 | Wt 206.4 lb

## 2020-09-22 DIAGNOSIS — Z905 Acquired absence of kidney: Secondary | ICD-10-CM | POA: Insufficient documentation

## 2020-09-22 DIAGNOSIS — Z7984 Long term (current) use of oral hypoglycemic drugs: Secondary | ICD-10-CM | POA: Diagnosis not present

## 2020-09-22 DIAGNOSIS — Z79899 Other long term (current) drug therapy: Secondary | ICD-10-CM | POA: Diagnosis not present

## 2020-09-22 DIAGNOSIS — D49511 Neoplasm of unspecified behavior of right kidney: Secondary | ICD-10-CM

## 2020-09-22 DIAGNOSIS — L299 Pruritus, unspecified: Secondary | ICD-10-CM | POA: Diagnosis not present

## 2020-09-22 DIAGNOSIS — Z7901 Long term (current) use of anticoagulants: Secondary | ICD-10-CM | POA: Diagnosis not present

## 2020-09-22 DIAGNOSIS — R21 Rash and other nonspecific skin eruption: Secondary | ICD-10-CM | POA: Diagnosis not present

## 2020-09-22 DIAGNOSIS — C641 Malignant neoplasm of right kidney, except renal pelvis: Secondary | ICD-10-CM | POA: Insufficient documentation

## 2020-09-22 DIAGNOSIS — Z95828 Presence of other vascular implants and grafts: Secondary | ICD-10-CM

## 2020-09-22 LAB — CBC WITH DIFFERENTIAL (CANCER CENTER ONLY)
Abs Immature Granulocytes: 0.04 10*3/uL (ref 0.00–0.07)
Basophils Absolute: 0.1 10*3/uL (ref 0.0–0.1)
Basophils Relative: 1 %
Eosinophils Absolute: 0.6 10*3/uL — ABNORMAL HIGH (ref 0.0–0.5)
Eosinophils Relative: 8 %
HCT: 30.6 % — ABNORMAL LOW (ref 36.0–46.0)
Hemoglobin: 10 g/dL — ABNORMAL LOW (ref 12.0–15.0)
Immature Granulocytes: 1 %
Lymphocytes Relative: 23 %
Lymphs Abs: 1.5 10*3/uL (ref 0.7–4.0)
MCH: 27.5 pg (ref 26.0–34.0)
MCHC: 32.7 g/dL (ref 30.0–36.0)
MCV: 84.1 fL (ref 80.0–100.0)
Monocytes Absolute: 0.6 10*3/uL (ref 0.1–1.0)
Monocytes Relative: 9 %
Neutro Abs: 3.9 10*3/uL (ref 1.7–7.7)
Neutrophils Relative %: 58 %
Platelet Count: 337 10*3/uL (ref 150–400)
RBC: 3.64 MIL/uL — ABNORMAL LOW (ref 3.87–5.11)
RDW: 15.4 % (ref 11.5–15.5)
WBC Count: 6.7 10*3/uL (ref 4.0–10.5)
nRBC: 0 % (ref 0.0–0.2)

## 2020-09-22 LAB — CMP (CANCER CENTER ONLY)
ALT: 16 U/L (ref 0–44)
AST: 20 U/L (ref 15–41)
Albumin: 3.4 g/dL — ABNORMAL LOW (ref 3.5–5.0)
Alkaline Phosphatase: 52 U/L (ref 38–126)
Anion gap: 10 (ref 5–15)
BUN: 25 mg/dL — ABNORMAL HIGH (ref 8–23)
CO2: 23 mmol/L (ref 22–32)
Calcium: 10.3 mg/dL (ref 8.9–10.3)
Chloride: 104 mmol/L (ref 98–111)
Creatinine: 1.31 mg/dL — ABNORMAL HIGH (ref 0.44–1.00)
GFR, Estimated: 43 mL/min — ABNORMAL LOW (ref >60)
Glucose, Bld: 141 mg/dL — ABNORMAL HIGH (ref 70–99)
Potassium: 4 mmol/L (ref 3.5–5.1)
Sodium: 137 mmol/L (ref 135–145)
Total Bilirubin: 0.4 mg/dL (ref 0.3–1.2)
Total Protein: 7.6 g/dL (ref 6.5–8.1)

## 2020-09-22 MED ORDER — SODIUM CHLORIDE 0.9% FLUSH
10.0000 mL | INTRAVENOUS | Status: DC | PRN
  Administered 2020-09-22: 10 mL via INTRAVENOUS
  Filled 2020-09-22: qty 10

## 2020-09-22 MED ORDER — TRIAMCINOLONE ACETONIDE 0.5 % EX OINT: 1.0000 "application " | TOPICAL_OINTMENT | Freq: Two times a day (BID) | CUTANEOUS | 3 refills | Status: DC

## 2020-09-22 MED ORDER — HEPARIN SOD (PORK) LOCK FLUSH 100 UNIT/ML IV SOLN
500.0000 [IU] | Freq: Once | INTRAVENOUS | Status: AC
  Administered 2020-09-22: 500 [IU] via INTRAVENOUS
  Filled 2020-09-22: qty 5

## 2020-09-22 NOTE — Progress Notes (Signed)
Hematology and Oncology Follow Up Visit  Latasha Hodges [id number] 10/04/1945 75 y.o. 09/22/2020 1:25 PM Latasha Hodges, MDGriffin, Latasha Sheffield, MD   Principle Diagnosis: 75 year old woman with T3aN1 clear-cell renal cell carcinoma diagnosed in December 2021.   Prior Therapy:   She is status post right robotic assisted laparoscopic radical nephrectomy completed by Dr. Alinda Money on January 29, 2020.  The final pathology showed a 7.0 cm clear cell renal cell carcinoma with tumor extends into the renal vein, renal capsule and renal sinus fat indicating T3a disease with 1 lymph node involvement.  No sarcomatoid or rhabdoid features noted with histological grade of G3.  Current therapy:  Pembrolizumab 200 mg every 3 weeks started on March 31, 2020.  She received 400 mg starting with cycle 5 of therapy in May 2022.  She is here for cycle 7 of therapy.  Interim History: [name] presents today for return evaluation.  Since last visit, she reports a few complaints related to Pembrolizumab.  She has reported worsening dermatological toxicity including skin rash and pruritus as well as worsening irritation in her gum tissue.  He denies any nausea, vomiting or abdominal pain.  She does report fluid retention predominantly in the abdomen and mouth.  Overall quality of life is maintained but has declined slightly.    Medications: Unchanged on review. Current Outpatient Medications  Medication Sig Dispense Refill   clotrimazole-betamethasone (LOTRISONE) cream Apply 1 application topically 2 (two) times daily. May use with OTC zinc oxide cream. 135 g 0   Dulaglutide (TRULICITY) 1.5 0000000 SOPN Inject 1.5 mg into the skin every Thursday.     fenofibrate micronized (LOFIBRA) 200 MG capsule TAKE 1 CAPSULE BY MOUTH DAILY BEFORE BREAKFAST (Patient taking differently: Take 200 mg by mouth daily before breakfast.) 90 capsule 2   flecainide (TAMBOCOR) 50 MG tablet TAKE 1 TABLET(50 MG) BY MOUTH TWICE DAILY  (Patient taking differently: Take 50 mg by mouth 2 (two) times daily.) 180 tablet 3   HYDROcodone-acetaminophen (NORCO/VICODIN) 5-325 MG tablet Take 1 tablet by mouth every 4 (four) hours as needed for moderate pain. 20 tablet 0   hydrOXYzine (ATARAX/VISTARIL) 10 MG tablet Take 1 tablet (10 mg total) by mouth 3 (three) times daily as needed. 30 tablet 0   lidocaine-prilocaine (EMLA) cream Apply 1 application topically as needed. 30 g 0   lisinopril (ZESTRIL) 20 MG tablet TAKE 1 TABLET(20 MG) BY MOUTH DAILY (Patient taking differently: Take 20 mg by mouth daily.) 90 tablet 3   magnesium oxide (MAG-OX) 400 MG tablet Take 800 mg by mouth at bedtime.     metFORMIN (GLUCOPHAGE) 1000 MG tablet Take 1,000 mg by mouth 2 (two) times daily.  0   methocarbamol (ROBAXIN) 500 MG tablet Take 1 tablet (500 mg total) by mouth 4 (four) times daily. 45 tablet 0   methylPREDNISolone (MEDROL DOSEPAK) 4 MG TBPK tablet Take 6 tablets day 1, then Take 5 tablets day 2, Take 4 tablets day 3 Take 3 tablets day 4, Take 2 tablets day 5 Take 1 tablet  day 6 and then stop. (Patient not taking: Reported on 09/03/2020) 21 tablet 0   metoprolol succinate (TOPROL-XL) 50 MG 24 hr tablet TAKE 1 TABLET(50 MG) BY MOUTH DAILY (Patient taking differently: Take 50 mg by mouth daily.) 90 tablet 3   pembrolizumab (KEYTRUDA) 100 MG/4ML SOLN Inject 400 mg into the vein every 6 (six) weeks.     prochlorperazine (COMPAZINE) 10 MG tablet Take 1 tablet (10 mg total) by mouth every 6 (six)  hours as needed for nausea or vomiting. 30 tablet 0   simvastatin (ZOCOR) 40 MG tablet TAKE 1 TABLET(40 MG) BY MOUTH DAILY AT 6 PM (Patient taking differently: Take 40 mg by mouth daily at 6 PM.) 90 tablet 3   triamcinolone ointment (KENALOG) 0.5 % Apply 1 application topically 2 (two) times daily. 30 g 0   warfarin (COUMADIN) 5 MG tablet TAKE BY MOUTH AS DIRECTED EVERY DAY (Patient taking differently: Take 2.5-5 mg by mouth See admin instructions. Take 5 mg by mouth  on Monday, Wednesday and Friday, take 2.5 mg on Tuesday, Thursday, Saturday and Sunday) 90 tablet 1   No current facility-administered medications for this visit.     Allergies:  Allergies  Allergen Reactions   Iodinated Diagnostic Agents Hives, Swelling and Rash   No Healthtouch Food Allergies Rash    Seafood   Penicillins Rash    Childhood reaction.   Sulfa Antibiotics Hives and Rash   Sulfa Drugs Cross Reactors Rash    Childhood reaction.   Theophyllines Rash      Physical Exam:    Blood pressure (!) 158/68, pulse (!) 59, temperature 97.8 F (36.6 C), resp. rate 18, weight 206 lb 6 oz (93.6 kg), SpO2 100 %.    ECOG: 1   General appearance: Comfortable appearing without any discomfort Head: Normocephalic without any trauma Oropharynx: Hyperemic gum tissue noted. Eyes: Pupils are equal and round reactive to light. Lymph nodes: No cervical, supraclavicular, inguinal or axillary lymphadenopathy.   Heart:regular rate and rhythm.  S1 and S2 without leg edema. Lung: Clear without any rhonchi or wheezes.  No dullness to percussion. Abdomin: Soft, nontender, nondistended with good bowel sounds.  No hepatosplenomegaly. Musculoskeletal: No joint deformity or effusion.  Full range of motion noted. Neurological: No deficits noted on motor, sensory and deep tendon reflex exam. Skin: Dry with erythematous raised lesions noted on her abdomen, chest and back.          Lab Results: Lab Results  Component Value Date   WBC 6.0 08/11/2020   HGB 9.6 (L) 08/11/2020   HCT 30.2 (L) 08/11/2020   MCV 85.3 08/11/2020   PLT 263 08/11/2020     Chemistry      Component Value Date/Time   NA 137 08/11/2020 1400   NA 139 08/20/2017 1147   K 4.3 08/11/2020 1400   CL 105 08/11/2020 1400   CO2 22 08/11/2020 1400   BUN 24 (H) 08/11/2020 1400   BUN 18 08/20/2017 1147   CREATININE 1.24 (H) 08/11/2020 1400      Component Value Date/Time   CALCIUM 9.1 08/11/2020 1400   ALKPHOS  66 08/11/2020 1400   AST 17 08/11/2020 1400   ALT 13 08/11/2020 1400   BILITOT 0.3 08/11/2020 1400          Impression and Plan:   75 year old woman with:   1.  T3a N1 clear cell renal cell carcinoma of the right kidney diagnosed in December 2021.  She is currently receiving adjuvant Pembrolizumab with few complications including dermatological and mucosal irritation.  Risks and benefits of continuing this treatment were discussed at this time.  Potential complications including worsening skin changes, autoimmune complications as well as GI toxicity were reviewed.   CT scan obtained on September 13, 2020 was personally reviewed and showed no evidence of metastatic disease.  After discussion today, we opted to discontinue Pembrolizumab given the toxicities that she has incurred at this time.  This can be resumed in the  future if he develops systemic disease.  Plan to repeat imaging study for the next 3 to 6 months.    2.  IV access: Port-A-Cath will continue to be flushed periodically.   3.  Antiemetics: No nausea or vomiting reported at this time.   4.  Immune mediated complications: She is experiencing dermatological toxicities predominantly pruritus and skin rash.  Topical steroid will be available to her.  5.  Abnormal gum tissue: Recommended evaluation by her dentist which she will do in the near future.  6.  Follow-up: In 6 weeks for repeat evaluation.   30  minutes were spent on this encounter.  The time was dedicated to reviewing imaging studies, disease status update, treatment choices and addressing complications related to cancer and cancer therapy. Zola Button, MD 8/10/20221:25 PM

## 2020-09-23 ENCOUNTER — Telehealth: Payer: Self-pay | Admitting: *Deleted

## 2020-09-23 ENCOUNTER — Other Ambulatory Visit: Payer: Self-pay | Admitting: Oncology

## 2020-09-23 DIAGNOSIS — E1165 Type 2 diabetes mellitus with hyperglycemia: Secondary | ICD-10-CM | POA: Diagnosis not present

## 2020-09-23 LAB — TSH: TSH: 54.986 u[IU]/mL — ABNORMAL HIGH (ref 0.308–3.960)

## 2020-09-23 MED ORDER — LEVOTHYROXINE SODIUM 25 MCG PO TABS: 25.0000 ug | ORAL_TABLET | Freq: Every day | ORAL | 3 refills | Status: DC

## 2020-09-23 NOTE — Telephone Encounter (Signed)
-----   Message from Wyatt Portela, MD sent at 09/23/2020  8:59 AM EDT ----- Please let her know her thyroid function is abnormal due to treatment drug. She will need thyroid replacement and I will send Rx to her pharmacy.

## 2020-09-23 NOTE — Telephone Encounter (Signed)
PC to patient, informed her TSH is elevated, Dr. Alen Blew has sent prescription for Synthroid 25 mcg to her pharmacy.  Instructed patient to take one tablet daily before breakfast.  Patient verbalizes understanding.

## 2020-10-01 ENCOUNTER — Ambulatory Visit (INDEPENDENT_AMBULATORY_CARE_PROVIDER_SITE_OTHER): Payer: Medicare Other | Admitting: *Deleted

## 2020-10-01 ENCOUNTER — Other Ambulatory Visit: Payer: Self-pay

## 2020-10-01 ENCOUNTER — Telehealth: Payer: Self-pay | Admitting: Oncology

## 2020-10-01 DIAGNOSIS — I4819 Other persistent atrial fibrillation: Secondary | ICD-10-CM

## 2020-10-01 DIAGNOSIS — Z5181 Encounter for therapeutic drug level monitoring: Secondary | ICD-10-CM | POA: Diagnosis not present

## 2020-10-01 DIAGNOSIS — I480 Paroxysmal atrial fibrillation: Secondary | ICD-10-CM | POA: Diagnosis not present

## 2020-10-01 DIAGNOSIS — Z7189 Other specified counseling: Secondary | ICD-10-CM

## 2020-10-01 LAB — POCT INR: INR: 1.9 — AB (ref 2.0–3.0)

## 2020-10-01 NOTE — Patient Instructions (Signed)
Description   Take 1.5 tablets of warfarin today and then continue on same dosage 1 tablet daily except 1/2 tablet on Tuesdays, Thursdays, and Saturdays. Keep greens consistent (2 salads) Recheck INR 3 weeks. Call our office if you have any questions or if placed on any new medications [phone].

## 2020-10-01 NOTE — Telephone Encounter (Signed)
Scheduled per los, patient has been called and notified of upcoming appointments. 

## 2020-10-08 ENCOUNTER — Other Ambulatory Visit: Payer: Self-pay

## 2020-10-08 ENCOUNTER — Ambulatory Visit (INDEPENDENT_AMBULATORY_CARE_PROVIDER_SITE_OTHER): Payer: Medicare Other

## 2020-10-08 DIAGNOSIS — I4819 Other persistent atrial fibrillation: Secondary | ICD-10-CM

## 2020-10-08 DIAGNOSIS — I480 Paroxysmal atrial fibrillation: Secondary | ICD-10-CM | POA: Diagnosis not present

## 2020-10-08 DIAGNOSIS — Z7189 Other specified counseling: Secondary | ICD-10-CM

## 2020-10-08 DIAGNOSIS — Z5181 Encounter for therapeutic drug level monitoring: Secondary | ICD-10-CM

## 2020-10-08 LAB — POCT INR: INR: 2.9 (ref 2.0–3.0)

## 2020-10-08 NOTE — Patient Instructions (Addendum)
Description   Continue on same dosage 1 tablet daily except 1/2 tablet on Tuesdays, Thursdays, and Saturdays.  Increase vit K intake slightly while on antibiotic (usually eats 2 salads/week).  Recheck INR on Tuesday (pt will have been on Doxy x 1 week at that time). Call our office if you have any questions or if placed on any new medications [phone].

## 2020-10-12 ENCOUNTER — Other Ambulatory Visit: Payer: Self-pay

## 2020-10-12 ENCOUNTER — Ambulatory Visit (INDEPENDENT_AMBULATORY_CARE_PROVIDER_SITE_OTHER): Payer: Medicare Other

## 2020-10-12 DIAGNOSIS — Z7189 Other specified counseling: Secondary | ICD-10-CM | POA: Diagnosis not present

## 2020-10-12 DIAGNOSIS — Z5181 Encounter for therapeutic drug level monitoring: Secondary | ICD-10-CM

## 2020-10-12 DIAGNOSIS — I4819 Other persistent atrial fibrillation: Secondary | ICD-10-CM | POA: Diagnosis not present

## 2020-10-12 DIAGNOSIS — I480 Paroxysmal atrial fibrillation: Secondary | ICD-10-CM | POA: Diagnosis not present

## 2020-10-12 LAB — POCT INR: INR: 2.7 (ref 2.0–3.0)

## 2020-10-12 NOTE — Patient Instructions (Signed)
Description   Continue on same dosage 1 tablet daily except 1/2 tablet on Tuesdays, Thursdays, and Saturdays.  Increase vit K intake slightly while on antibiotic (usually eats 2 salads/week).  Recheck INR on 9/9 . Call our office if you have any questions or if placed on any new medications [phone].

## 2020-10-22 ENCOUNTER — Other Ambulatory Visit: Payer: Self-pay

## 2020-10-22 ENCOUNTER — Ambulatory Visit (INDEPENDENT_AMBULATORY_CARE_PROVIDER_SITE_OTHER): Payer: Medicare Other

## 2020-10-22 DIAGNOSIS — I4819 Other persistent atrial fibrillation: Secondary | ICD-10-CM | POA: Diagnosis not present

## 2020-10-22 DIAGNOSIS — I480 Paroxysmal atrial fibrillation: Secondary | ICD-10-CM | POA: Diagnosis not present

## 2020-10-22 DIAGNOSIS — Z5181 Encounter for therapeutic drug level monitoring: Secondary | ICD-10-CM

## 2020-10-22 DIAGNOSIS — Z7189 Other specified counseling: Secondary | ICD-10-CM | POA: Diagnosis not present

## 2020-10-22 LAB — POCT INR: INR: 3.8 — AB (ref 2.0–3.0)

## 2020-10-22 NOTE — Patient Instructions (Signed)
Description   Skip today's dosage of Warfarin, then resume same dosage 1 tablet daily except 1/2 tablet on Tuesdays, Thursdays, and Saturdays.  Recheck INR in 2 weeks. Call our office if you have any questions or if placed on any new medications [phone].

## 2020-10-27 ENCOUNTER — Other Ambulatory Visit: Payer: Self-pay | Admitting: Cardiology

## 2020-11-04 ENCOUNTER — Ambulatory Visit (INDEPENDENT_AMBULATORY_CARE_PROVIDER_SITE_OTHER): Payer: Medicare Other

## 2020-11-04 ENCOUNTER — Other Ambulatory Visit: Payer: Self-pay

## 2020-11-04 DIAGNOSIS — I4819 Other persistent atrial fibrillation: Secondary | ICD-10-CM

## 2020-11-04 DIAGNOSIS — I10 Essential (primary) hypertension: Secondary | ICD-10-CM | POA: Diagnosis not present

## 2020-11-04 DIAGNOSIS — I480 Paroxysmal atrial fibrillation: Secondary | ICD-10-CM | POA: Diagnosis not present

## 2020-11-04 DIAGNOSIS — M48062 Spinal stenosis, lumbar region with neurogenic claudication: Secondary | ICD-10-CM | POA: Diagnosis not present

## 2020-11-04 DIAGNOSIS — Z7189 Other specified counseling: Secondary | ICD-10-CM

## 2020-11-04 DIAGNOSIS — Z5181 Encounter for therapeutic drug level monitoring: Secondary | ICD-10-CM | POA: Diagnosis not present

## 2020-11-04 LAB — POCT INR: INR: 3 (ref 2.0–3.0)

## 2020-11-04 NOTE — Patient Instructions (Signed)
Description   Continue taking 1 tablet daily except 1/2 tablet on Tuesdays, Thursdays, and Saturdays.  Recheck INR in 3 weeks. Call our office if you have any questions or if placed on any new medications [phone].

## 2020-11-05 ENCOUNTER — Other Ambulatory Visit: Payer: Medicare Other

## 2020-11-05 ENCOUNTER — Inpatient Hospital Stay (HOSPITAL_BASED_OUTPATIENT_CLINIC_OR_DEPARTMENT_OTHER): Payer: Medicare Other | Admitting: Oncology

## 2020-11-05 ENCOUNTER — Ambulatory Visit: Payer: Medicare Other | Admitting: Oncology

## 2020-11-05 ENCOUNTER — Inpatient Hospital Stay: Payer: Medicare Other | Attending: Oncology

## 2020-11-05 VITALS — BP 142/49 | HR 61 | Temp 96.3°F | Resp 17 | Ht 63.5 in | Wt 210.4 lb

## 2020-11-05 DIAGNOSIS — C641 Malignant neoplasm of right kidney, except renal pelvis: Secondary | ICD-10-CM | POA: Insufficient documentation

## 2020-11-05 DIAGNOSIS — Z9221 Personal history of antineoplastic chemotherapy: Secondary | ICD-10-CM | POA: Insufficient documentation

## 2020-11-05 DIAGNOSIS — E039 Hypothyroidism, unspecified: Secondary | ICD-10-CM | POA: Insufficient documentation

## 2020-11-05 DIAGNOSIS — D49511 Neoplasm of unspecified behavior of right kidney: Secondary | ICD-10-CM | POA: Diagnosis not present

## 2020-11-05 DIAGNOSIS — Z79899 Other long term (current) drug therapy: Secondary | ICD-10-CM | POA: Insufficient documentation

## 2020-11-05 DIAGNOSIS — Z95828 Presence of other vascular implants and grafts: Secondary | ICD-10-CM

## 2020-11-05 LAB — CMP (CANCER CENTER ONLY)
ALT: 18 U/L (ref 0–44)
AST: 26 U/L (ref 15–41)
Albumin: 3.3 g/dL — ABNORMAL LOW (ref 3.5–5.0)
Alkaline Phosphatase: 43 U/L (ref 38–126)
Anion gap: 8 (ref 5–15)
BUN: 31 mg/dL — ABNORMAL HIGH (ref 8–23)
CO2: 25 mmol/L (ref 22–32)
Calcium: 9.4 mg/dL (ref 8.9–10.3)
Chloride: 105 mmol/L (ref 98–111)
Creatinine: 1.45 mg/dL — ABNORMAL HIGH (ref 0.44–1.00)
GFR, Estimated: 38 mL/min — ABNORMAL LOW (ref >60)
Glucose, Bld: 210 mg/dL — ABNORMAL HIGH (ref 70–99)
Potassium: 4.3 mmol/L (ref 3.5–5.1)
Sodium: 138 mmol/L (ref 135–145)
Total Bilirubin: 0.4 mg/dL (ref 0.3–1.2)
Total Protein: 6.7 g/dL (ref 6.5–8.1)

## 2020-11-05 LAB — CBC WITH DIFFERENTIAL (CANCER CENTER ONLY)
Abs Immature Granulocytes: 0.04 10*3/uL (ref 0.00–0.07)
Basophils Absolute: 0 10*3/uL (ref 0.0–0.1)
Basophils Relative: 1 %
Eosinophils Absolute: 0.4 10*3/uL (ref 0.0–0.5)
Eosinophils Relative: 8 %
HCT: 29.2 % — ABNORMAL LOW (ref 36.0–46.0)
Hemoglobin: 9.3 g/dL — ABNORMAL LOW (ref 12.0–15.0)
Immature Granulocytes: 1 %
Lymphocytes Relative: 24 %
Lymphs Abs: 1.3 10*3/uL (ref 0.7–4.0)
MCH: 27.4 pg (ref 26.0–34.0)
MCHC: 31.8 g/dL (ref 30.0–36.0)
MCV: 86.1 fL (ref 80.0–100.0)
Monocytes Absolute: 0.5 10*3/uL (ref 0.1–1.0)
Monocytes Relative: 10 %
Neutro Abs: 2.9 10*3/uL (ref 1.7–7.7)
Neutrophils Relative %: 56 %
Platelet Count: 265 10*3/uL (ref 150–400)
RBC: 3.39 MIL/uL — ABNORMAL LOW (ref 3.87–5.11)
RDW: 16.3 % — ABNORMAL HIGH (ref 11.5–15.5)
WBC Count: 5.2 10*3/uL (ref 4.0–10.5)
nRBC: 0 % (ref 0.0–0.2)

## 2020-11-05 MED ORDER — TRIAMCINOLONE ACETONIDE 0.5 % EX OINT: 1.0000 "application " | TOPICAL_OINTMENT | Freq: Two times a day (BID) | CUTANEOUS | 3 refills | Status: DC

## 2020-11-05 MED ORDER — HEPARIN SOD (PORK) LOCK FLUSH 100 UNIT/ML IV SOLN
500.0000 [IU] | Freq: Once | INTRAVENOUS | Status: AC
  Administered 2020-11-05: 500 [IU] via INTRAVENOUS

## 2020-11-05 MED ORDER — SODIUM CHLORIDE 0.9% FLUSH
10.0000 mL | Freq: Once | INTRAVENOUS | Status: AC
  Administered 2020-11-05: 10 mL via INTRAVENOUS

## 2020-11-05 NOTE — Progress Notes (Signed)
Hematology and Oncology Follow Up Visit  Latasha Hodges [id number] 1945-02-18 75 y.o. 11/05/2020 3:28 PM Latasha Hodges, MDGriffin, Latasha Sheffield, MD   Principle Diagnosis: 24 year old woman with kidney cancer diagnosed in December 2021.  She was found to have T3aN1 clear-cell tumor.   Prior Therapy:   She is status post right robotic assisted laparoscopic radical nephrectomy completed by Dr. Alinda Hodges on January 29, 2020.  The final pathology showed a 7.0 cm clear cell renal cell carcinoma with tumor extends into the renal vein, renal capsule and renal sinus fat indicating T3a disease with 1 lymph node involvement.  No sarcomatoid or rhabdoid features noted with histological grade of G3.  Pembrolizumab 200 mg every 3 weeks started on March 31, 2020.  She received 400 mg starting with cycle 5 of therapy in May 2022.  Last cycle given on August 11, 2020.  Current therapy: Active surveillance.  Interim History: Latasha Hodges returns today for a follow-up visit.  Since the last visit, she reports a few complaints at this time.  She continues to have pruritus with occasional rash which has not improved at this time.  She has had also dental issues including gum infection and currently under evaluation and follow-up by her dentist.  This has affected her appetite but still able to eat and maintain weight.    Medications: Reviewed without changes. Current Outpatient Medications  Medication Sig Dispense Refill   clotrimazole-betamethasone (LOTRISONE) cream Apply 1 application topically 2 (two) times daily. May use with OTC zinc oxide cream. 135 g 0   Dulaglutide (TRULICITY) 1.5 UD/1.4HF SOPN Inject 1.5 mg into the skin every Thursday.     fenofibrate micronized (LOFIBRA) 200 MG capsule TAKE 1 CAPSULE BY MOUTH DAILY BEFORE BREAKFAST (Patient taking differently: Take 200 mg by mouth daily before breakfast.) 90 capsule 2   flecainide (TAMBOCOR) 50 MG tablet TAKE 1 TABLET(50 MG) BY MOUTH TWICE DAILY  (Patient taking differently: Take 50 mg by mouth 2 (two) times daily.) 180 tablet 3   HYDROcodone-acetaminophen (NORCO/VICODIN) 5-325 MG tablet Take 1 tablet by mouth every 4 (four) hours as needed for moderate pain. 20 tablet 0   hydrOXYzine (ATARAX/VISTARIL) 10 MG tablet Take 1 tablet (10 mg total) by mouth 3 (three) times daily as needed. 30 tablet 0   levothyroxine (SYNTHROID) 25 MCG tablet Take 1 tablet (25 mcg total) by mouth daily before breakfast. 90 tablet 3   lidocaine-prilocaine (EMLA) cream Apply 1 application topically as needed. 30 g 0   lisinopril (ZESTRIL) 20 MG tablet TAKE 1 TABLET(20 MG) BY MOUTH DAILY (Patient taking differently: Take 20 mg by mouth daily.) 90 tablet 3   magnesium oxide (MAG-OX) 400 MG tablet Take 800 mg by mouth at bedtime.     metFORMIN (GLUCOPHAGE) 1000 MG tablet Take 1,000 mg by mouth 2 (two) times daily.  0   methocarbamol (ROBAXIN) 500 MG tablet Take 1 tablet (500 mg total) by mouth 4 (four) times daily. 45 tablet 0   methylPREDNISolone (MEDROL DOSEPAK) 4 MG TBPK tablet Take 6 tablets day 1, then Take 5 tablets day 2, Take 4 tablets day 3 Take 3 tablets day 4, Take 2 tablets day 5 Take 1 tablet  day 6 and then stop. (Patient not taking: Reported on 09/03/2020) 21 tablet 0   metoprolol succinate (TOPROL-XL) 50 MG 24 hr tablet TAKE 1 TABLET(50 MG) BY MOUTH DAILY (Patient taking differently: Take 50 mg by mouth daily.) 90 tablet 3   pembrolizumab (KEYTRUDA) 100 MG/4ML SOLN Inject 400 mg into  the vein every 6 (six) weeks.     prochlorperazine (COMPAZINE) 10 MG tablet Take 1 tablet (10 mg total) by mouth every 6 (six) hours as needed for nausea or vomiting. 30 tablet 0   simvastatin (ZOCOR) 40 MG tablet TAKE 1 TABLET(40 MG) BY MOUTH DAILY AT 6 PM (Patient taking differently: Take 40 mg by mouth daily at 6 PM.) 90 tablet 3   triamcinolone ointment (KENALOG) 0.5 % Apply 1 application topically 2 (two) times daily. 45 g 3   warfarin (COUMADIN) 5 MG tablet TAKE AS  DIRECTED BY COUMADIN CLINIC 90 tablet 1   No current facility-administered medications for this visit.   Facility-Administered Medications Ordered in Other Visits  Medication Dose Route Frequency Provider Last Rate Last Admin   heparin lock flush 100 unit/mL  500 Units Intravenous Once Latasha Hodges, Latasha Dad, MD       sodium chloride flush (NS) 0.9 % injection 10 mL  10 mL Intravenous Once Latasha Portela, MD         Allergies:  Allergies  Allergen Reactions   Iodinated Diagnostic Agents Hives, Swelling and Rash   No [employer] Food Allergies Rash    Seafood   Penicillins Rash    Childhood reaction.   Sulfa Antibiotics Hives and Rash   Sulfa Drugs Cross Reactors Rash    Childhood reaction.   Theophyllines Rash      Physical Exam:    Blood pressure (!) 142/49, pulse 61, temperature (!) 96.3 F (35.7 C), temperature source Oral, resp. rate 17, height 5' 3.5" (1.613 m), weight 210 lb 6.4 oz (95.4 kg), SpO2 100 %.     ECOG: 1     General appearance: Alert, awake without any distress. Head: Atraumatic without abnormalities Oropharynx: Without any thrush or ulcers. Eyes: No scleral icterus. Lymph nodes: No lymphadenopathy noted in the cervical, supraclavicular, or axillary nodes Heart:regular rate and rhythm, without any murmurs or gallops.   Lung: Clear to auscultation without any rhonchi, wheezes or dullness to percussion. Abdomin: Soft, nontender without any shifting dullness or ascites. Musculoskeletal: No clubbing or cyanosis. Neurological: No motor or sensory deficits. Skin: No rashes or lesions.           Lab Results: Lab Results  Component Value Date   WBC 6.7 09/22/2020   HGB 10.0 (L) 09/22/2020   HCT 30.6 (L) 09/22/2020   MCV 84.1 09/22/2020   PLT 337 09/22/2020     Chemistry      Component Value Date/Time   NA 137 09/22/2020 1407   NA 139 08/20/2017 1147   K 4.0 09/22/2020 1407   CL 104 09/22/2020 1407   CO2 23 09/22/2020 1407   BUN 25  (H) 09/22/2020 1407   BUN 18 08/20/2017 1147   CREATININE 1.31 (H) 09/22/2020 1407      Component Value Date/Time   CALCIUM 10.3 09/22/2020 1407   ALKPHOS 52 09/22/2020 1407   AST 20 09/22/2020 1407   ALT 16 09/22/2020 1407   BILITOT 0.4 09/22/2020 1407          Impression and Plan:   75 year old woman with:   1.  Kidney cancer diagnosed in December 2021.  She was found to have T3a N1 clear cell tumor.  Her disease status was updated at this time and treatment options were reviewed.  She has declined further adjuvant immunotherapy because of complications.  I have recommended continued active surveillance at this time instead.  Plan is to update her staging scans in January  of 2023.    2.  IV access: Risks and benefits of keeping her Port-A-Cath were discussed at this time.     3.  Immune mediated complications: She has experienced dermatitis but no other complications.  Pneumonitis, colitis and thyroid disease were also discussed.  She has experienced dermatitis which we will treat with topical creams.  She has declined and is on therapy.  4.  Abnormal gum tissue: She continues to follow with her dentist regarding this issue.  It is unclear if this is autoimmune in nature.  5.  Hypothyroidism: She is currently on Synthroid and continue to monitor her TSH and adjust as needed.  6.  Follow-up: In now 4 months for repeat evaluation.   30  minutes were dedicated to this encounter.  Time was spent on reviewing laboratory data, disease status update, treatment choices and future plan of care review.   Zola Button, MD 9/23/20223:28 PM

## 2020-11-08 LAB — TSH: TSH: 52.575 u[IU]/mL — ABNORMAL HIGH (ref 0.308–3.960)

## 2020-11-16 NOTE — Progress Notes (Signed)
..  Patient Assist/Replace for the following has been terminated. Medication: Keytruda (pembrolizumab) Reason for Termination: Treatment completed 08/11/2020, no longer has active treatment plan. Last DOS: 08/11/2020. Marland KitchenJuan Quam, CPhT IV Drug Replacement Specialist [place] Phone: [phone]

## 2020-11-23 ENCOUNTER — Encounter: Payer: Self-pay | Admitting: Gastroenterology

## 2020-11-29 ENCOUNTER — Other Ambulatory Visit: Payer: Self-pay

## 2020-11-29 ENCOUNTER — Ambulatory Visit (INDEPENDENT_AMBULATORY_CARE_PROVIDER_SITE_OTHER): Payer: Medicare Other

## 2020-11-29 DIAGNOSIS — Z7189 Other specified counseling: Secondary | ICD-10-CM

## 2020-11-29 DIAGNOSIS — I480 Paroxysmal atrial fibrillation: Secondary | ICD-10-CM

## 2020-11-29 DIAGNOSIS — Z5181 Encounter for therapeutic drug level monitoring: Secondary | ICD-10-CM

## 2020-11-29 DIAGNOSIS — I4819 Other persistent atrial fibrillation: Secondary | ICD-10-CM | POA: Diagnosis not present

## 2020-11-29 LAB — POCT INR: INR: 1.8 — AB (ref 2.0–3.0)

## 2020-11-29 NOTE — Patient Instructions (Signed)
-   take 1.5 tablets warfarin tonight, then - continue taking 1 tablet daily except 1/2 tablet on Tuesdays, Thursdays, and Saturdays.   - Recheck INR in 3 weeks.  Call our office if you have any questions or if placed on any new medications [phone].

## 2020-12-22 ENCOUNTER — Ambulatory Visit (INDEPENDENT_AMBULATORY_CARE_PROVIDER_SITE_OTHER): Payer: Medicare Other | Admitting: *Deleted

## 2020-12-22 ENCOUNTER — Other Ambulatory Visit: Payer: Self-pay

## 2020-12-22 DIAGNOSIS — Z7189 Other specified counseling: Secondary | ICD-10-CM | POA: Diagnosis not present

## 2020-12-22 DIAGNOSIS — Z5181 Encounter for therapeutic drug level monitoring: Secondary | ICD-10-CM

## 2020-12-22 DIAGNOSIS — I480 Paroxysmal atrial fibrillation: Secondary | ICD-10-CM | POA: Diagnosis not present

## 2020-12-22 DIAGNOSIS — I4819 Other persistent atrial fibrillation: Secondary | ICD-10-CM | POA: Diagnosis not present

## 2020-12-22 LAB — POCT INR: INR: 3.4 — AB (ref 2.0–3.0)

## 2020-12-22 NOTE — Patient Instructions (Signed)
Description   -Hold warfarin today.  - Continue taking 1 tablet daily except 1/2 tablet on Tuesdays, Thursdays, and Saturdays.   - Recheck INR in 3 weeks.  Call our office if you have any questions or if placed on any new medications [phone].

## 2020-12-30 DIAGNOSIS — I4891 Unspecified atrial fibrillation: Secondary | ICD-10-CM | POA: Diagnosis not present

## 2020-12-30 DIAGNOSIS — Z0000 Encounter for general adult medical examination without abnormal findings: Secondary | ICD-10-CM | POA: Diagnosis not present

## 2020-12-30 DIAGNOSIS — I119 Hypertensive heart disease without heart failure: Secondary | ICD-10-CM | POA: Diagnosis not present

## 2020-12-30 DIAGNOSIS — Z1389 Encounter for screening for other disorder: Secondary | ICD-10-CM | POA: Diagnosis not present

## 2020-12-30 DIAGNOSIS — E039 Hypothyroidism, unspecified: Secondary | ICD-10-CM | POA: Diagnosis not present

## 2020-12-30 DIAGNOSIS — E782 Mixed hyperlipidemia: Secondary | ICD-10-CM | POA: Diagnosis not present

## 2020-12-30 DIAGNOSIS — E1165 Type 2 diabetes mellitus with hyperglycemia: Secondary | ICD-10-CM | POA: Diagnosis not present

## 2020-12-30 DIAGNOSIS — J45909 Unspecified asthma, uncomplicated: Secondary | ICD-10-CM | POA: Diagnosis not present

## 2020-12-30 DIAGNOSIS — C641 Malignant neoplasm of right kidney, except renal pelvis: Secondary | ICD-10-CM | POA: Diagnosis not present

## 2020-12-30 DIAGNOSIS — D6869 Other thrombophilia: Secondary | ICD-10-CM | POA: Diagnosis not present

## 2020-12-30 DIAGNOSIS — M4800 Spinal stenosis, site unspecified: Secondary | ICD-10-CM | POA: Diagnosis not present

## 2020-12-30 DIAGNOSIS — Z1159 Encounter for screening for other viral diseases: Secondary | ICD-10-CM | POA: Diagnosis not present

## 2021-01-07 ENCOUNTER — Inpatient Hospital Stay: Payer: Medicare Other

## 2021-01-07 ENCOUNTER — Other Ambulatory Visit: Payer: Medicare Other

## 2021-01-07 ENCOUNTER — Inpatient Hospital Stay: Payer: Medicare Other | Attending: Oncology

## 2021-01-07 ENCOUNTER — Other Ambulatory Visit: Payer: Self-pay

## 2021-01-07 DIAGNOSIS — C641 Malignant neoplasm of right kidney, except renal pelvis: Secondary | ICD-10-CM | POA: Insufficient documentation

## 2021-01-07 DIAGNOSIS — D49511 Neoplasm of unspecified behavior of right kidney: Secondary | ICD-10-CM

## 2021-01-07 DIAGNOSIS — Z79899 Other long term (current) drug therapy: Secondary | ICD-10-CM | POA: Insufficient documentation

## 2021-01-07 DIAGNOSIS — Z95828 Presence of other vascular implants and grafts: Secondary | ICD-10-CM

## 2021-01-07 LAB — CMP (CANCER CENTER ONLY)
ALT: 21 U/L (ref 0–44)
AST: 26 U/L (ref 15–41)
Albumin: 3.4 g/dL — ABNORMAL LOW (ref 3.5–5.0)
Alkaline Phosphatase: 49 U/L (ref 38–126)
Anion gap: 9 (ref 5–15)
BUN: 31 mg/dL — ABNORMAL HIGH (ref 8–23)
CO2: 25 mmol/L (ref 22–32)
Calcium: 9.3 mg/dL (ref 8.9–10.3)
Chloride: 106 mmol/L (ref 98–111)
Creatinine: 1.25 mg/dL — ABNORMAL HIGH (ref 0.44–1.00)
GFR, Estimated: 46 mL/min — ABNORMAL LOW (ref >60)
Glucose, Bld: 150 mg/dL — ABNORMAL HIGH (ref 70–99)
Potassium: 4.2 mmol/L (ref 3.5–5.1)
Sodium: 140 mmol/L (ref 135–145)
Total Bilirubin: 0.3 mg/dL (ref 0.3–1.2)
Total Protein: 6.7 g/dL (ref 6.5–8.1)

## 2021-01-07 LAB — CBC WITH DIFFERENTIAL (CANCER CENTER ONLY)
Abs Immature Granulocytes: 0.05 10*3/uL (ref 0.00–0.07)
Basophils Absolute: 0.1 10*3/uL (ref 0.0–0.1)
Basophils Relative: 1 %
Eosinophils Absolute: 0.3 10*3/uL (ref 0.0–0.5)
Eosinophils Relative: 4 %
HCT: 30.6 % — ABNORMAL LOW (ref 36.0–46.0)
Hemoglobin: 9.8 g/dL — ABNORMAL LOW (ref 12.0–15.0)
Immature Granulocytes: 1 %
Lymphocytes Relative: 28 %
Lymphs Abs: 1.8 10*3/uL (ref 0.7–4.0)
MCH: 27.8 pg (ref 26.0–34.0)
MCHC: 32 g/dL (ref 30.0–36.0)
MCV: 86.9 fL (ref 80.0–100.0)
Monocytes Absolute: 0.6 10*3/uL (ref 0.1–1.0)
Monocytes Relative: 10 %
Neutro Abs: 3.5 10*3/uL (ref 1.7–7.7)
Neutrophils Relative %: 56 %
Platelet Count: 283 10*3/uL (ref 150–400)
RBC: 3.52 MIL/uL — ABNORMAL LOW (ref 3.87–5.11)
RDW: 14.8 % (ref 11.5–15.5)
WBC Count: 6.3 10*3/uL (ref 4.0–10.5)
nRBC: 0 % (ref 0.0–0.2)

## 2021-01-07 LAB — TSH: TSH: 35.586 u[IU]/mL — ABNORMAL HIGH (ref 0.308–3.960)

## 2021-01-07 MED ORDER — SODIUM CHLORIDE 0.9% FLUSH
10.0000 mL | INTRAVENOUS | Status: AC | PRN
  Administered 2021-01-07: 10 mL

## 2021-01-07 MED ORDER — HEPARIN SOD (PORK) LOCK FLUSH 100 UNIT/ML IV SOLN
500.0000 [IU] | INTRAVENOUS | Status: AC | PRN
  Administered 2021-01-07: 500 [IU]

## 2021-01-10 ENCOUNTER — Other Ambulatory Visit: Payer: Self-pay | Admitting: Family Medicine

## 2021-01-10 DIAGNOSIS — M25532 Pain in left wrist: Secondary | ICD-10-CM | POA: Diagnosis not present

## 2021-01-10 DIAGNOSIS — S52532A Colles' fracture of left radius, initial encounter for closed fracture: Secondary | ICD-10-CM | POA: Diagnosis not present

## 2021-01-10 DIAGNOSIS — M25512 Pain in left shoulder: Secondary | ICD-10-CM | POA: Diagnosis not present

## 2021-01-10 DIAGNOSIS — Z13820 Encounter for screening for osteoporosis: Secondary | ICD-10-CM

## 2021-01-13 DIAGNOSIS — M25532 Pain in left wrist: Secondary | ICD-10-CM | POA: Diagnosis not present

## 2021-01-14 ENCOUNTER — Ambulatory Visit (INDEPENDENT_AMBULATORY_CARE_PROVIDER_SITE_OTHER): Payer: Medicare Other

## 2021-01-14 ENCOUNTER — Other Ambulatory Visit: Payer: Self-pay

## 2021-01-14 DIAGNOSIS — Z5181 Encounter for therapeutic drug level monitoring: Secondary | ICD-10-CM

## 2021-01-14 DIAGNOSIS — Z7189 Other specified counseling: Secondary | ICD-10-CM | POA: Diagnosis not present

## 2021-01-14 DIAGNOSIS — I480 Paroxysmal atrial fibrillation: Secondary | ICD-10-CM

## 2021-01-14 DIAGNOSIS — I4819 Other persistent atrial fibrillation: Secondary | ICD-10-CM

## 2021-01-14 LAB — POCT INR: INR: 3.3 — AB (ref 2.0–3.0)

## 2021-01-14 NOTE — Patient Instructions (Addendum)
Description   - Only take 0.5 tablet today and eat greens and then continue taking 1 tablet daily except 1/2 tablet on Tuesdays, Thursdays, and Saturdays.   - Recheck INR in 3 weeks.  - Stay consistent with greens (3 servings per week)  Call our office if you have any questions or if placed on any new medications [phone].

## 2021-01-19 DIAGNOSIS — M25532 Pain in left wrist: Secondary | ICD-10-CM | POA: Diagnosis not present

## 2021-01-19 DIAGNOSIS — S52502A Unspecified fracture of the lower end of left radius, initial encounter for closed fracture: Secondary | ICD-10-CM | POA: Diagnosis not present

## 2021-01-20 ENCOUNTER — Other Ambulatory Visit: Payer: Self-pay | Admitting: Cardiology

## 2021-02-01 ENCOUNTER — Encounter (HOSPITAL_BASED_OUTPATIENT_CLINIC_OR_DEPARTMENT_OTHER): Payer: Self-pay | Admitting: Orthopedic Surgery

## 2021-02-01 ENCOUNTER — Telehealth: Payer: Self-pay

## 2021-02-01 DIAGNOSIS — I700 Atherosclerosis of aorta: Secondary | ICD-10-CM | POA: Insufficient documentation

## 2021-02-01 NOTE — Telephone Encounter (Signed)
° ° °  Patient Name: Latasha Hodges  DOB: 09-22-1945 MRN: [id number]  Primary Cardiologist: Fransico Him, MD  Chart reviewed as part of pre-operative protocol coverage. Given past medical history and time since last visit, based on ACC/AHA guidelines, [name] would be at acceptable risk for the planned procedure without further cardiovascular testing.   Patient said that "surgeon does not want to hold warfarin however coumadin clinic said that she need to hold it".  I will route message to Pharmacy team to review anticoagulation.   [place], [place] 02/01/2021, 4:26 PM

## 2021-02-01 NOTE — Progress Notes (Signed)
Called and spoke w/ Caryl Pina, [place] scheduler, requested cardiac clearance for Coumadin due to pt surgery scheduled for 02-03-2021 for Dr Greta Doom, stated ok will request was unaware pt on coumadin.

## 2021-02-01 NOTE — Telephone Encounter (Signed)
Pt called into clinic stating she has an INR check for 02/02/21 at 3:30pm that she wanted to get sooner appt that day since she is going to have surgery on 02/03/21.  Inquired about upcoming surgery, pt is scheduled for a open reduction and internal fixation of her L radius on 02/03/21 by Dr [name] at [facility].  Asked pt if she was holding Warfarin and she states she did not need to per Dr Greta Doom.  Advised pt I have never heard of this being done while anticoagulated on Warfarin, usually pt are cleared to hold Warfarin prior to ORIF surgery.  Advised pt I would contact Dr Greta Doom office to find out if pt needs to hold Warfarin prior to procedure and we will call her back once we have further details and information.  [place] OR scheduler for Dr Greta Doom at [facility], inquired about upcoming surgery on 02/03/21 for pt and if Dr Greta Doom wants pt to hold Warfarin.  She states "Dr Greta Doom does this surgery all the time in the office with pt's on Warfarin and Eliquis".  She states this is a workers comp case and it has taken 10 days to get pt approved for surgery and it is to be done ASAP.  They are trying to get it done before they holidays.  Advised I understood the urgency of need for surgery, but I need her to contact Dr Greta Doom and clarify he knows pt is on Warfarin and has not held it at all, and to make sure he is aware and willing to do this surgery on 02/03/21 with pt anticoagulated on Warfarin.  Riccardo Dubin we usually get clearances faxed over to hold Warfarin prior to these types of surgeries and I need her to confirm per Dr Greta Doom pt does not need to hold her Warfarin prior to this upcoming surgery.  Caryl Pina states she will call Dr Greta Doom and clarify and either she or Dr Greta Doom will call us back. Will await call back.

## 2021-02-01 NOTE — Telephone Encounter (Signed)
° °  Pre-operative Risk Assessment    Patient Name: Latasha Hodges  DOB: 13-Dec-1945 MRN: [id number]     Request for Surgical Clearance   Procedure:   Left Radius Open Reduction Internal Fixation (ORIF)  Date of Surgery:  Clearance 02/03/21                               Surgeon:  Dr Shawn Stall Surgeon's Group or Practice Name:  [facility]  Phone number:  [phone] [facility]) Fax number:  [phone]   Type of Clearance Requested:   - Medical   Type of Anesthesia:   contacted office for anesthesia type  Additional requests/questions:   n/a  Signed, Byanka Landrus T   02/01/2021, 4:12 PM

## 2021-02-01 NOTE — Telephone Encounter (Signed)
Patient with diagnosis of afib on warfarin for anticoagulation.    Procedure: ORIF Date of procedure: 02/03/21  CHA2DS2-VASc Score = 5  This indicates a 7.2% annual risk of stroke. The patient's score is based upon: CHF History: 0 HTN History: 1 Diabetes History: 1 Stroke History: 0 Vascular Disease History: 1 Age Score: 1 Gender Score: 1  CrCl 35mL/min Platelet count 283K  Per office protocol, patient can hold warfarin for 5 days prior to procedure. See other phone note from [place], Coumadin RN regarding this. Does not appear MD knew pt was on warfarin. Warfarin is typically held for ORIF due to bleed risk. If pt does need to hold warfarin, procedure date would need to be moved.  Other phone note from ambulatory surgery today from [facility] states "Called and spoke w/ Caryl Pina, [place] scheduler, requested cardiac clearance for Coumadin due to pt surgery scheduled for 02-03-2021 for Dr Greta Doom, stated ok will request was unaware pt on coumadin."

## 2021-02-02 ENCOUNTER — Other Ambulatory Visit: Payer: Self-pay

## 2021-02-02 ENCOUNTER — Ambulatory Visit (INDEPENDENT_AMBULATORY_CARE_PROVIDER_SITE_OTHER): Payer: Medicare Other | Admitting: *Deleted

## 2021-02-02 DIAGNOSIS — S52502A Unspecified fracture of the lower end of left radius, initial encounter for closed fracture: Secondary | ICD-10-CM | POA: Diagnosis not present

## 2021-02-02 DIAGNOSIS — I480 Paroxysmal atrial fibrillation: Secondary | ICD-10-CM | POA: Diagnosis not present

## 2021-02-02 DIAGNOSIS — Z7189 Other specified counseling: Secondary | ICD-10-CM

## 2021-02-02 DIAGNOSIS — I4819 Other persistent atrial fibrillation: Secondary | ICD-10-CM

## 2021-02-02 DIAGNOSIS — Z5181 Encounter for therapeutic drug level monitoring: Secondary | ICD-10-CM

## 2021-02-02 LAB — POCT INR: INR: 2.6 (ref 2.0–3.0)

## 2021-02-02 NOTE — Patient Instructions (Addendum)
Description   Today take 1.5 tablets since you missed yesterday's dose then continue taking 1 tablet daily except 1/2 tablet on Tuesdays, Thursdays, and Saturdays. Recheck INR in 1 week after surgery. Stay consistent with greens (3 servings per week). Call our office if you have any questions or if placed on any new medications [phone].    Take your last dose of Warfarin on 12/23 & start holding Warfarin 12/24. When you resume your Warfarin take an extra 1/2 tablet for 2 days then resume normal dose.

## 2021-02-02 NOTE — Telephone Encounter (Signed)
° °  Primary Cardiologist: Fransico Him, MD  Chart reviewed as part of pre-operative protocol coverage. Given past medical history and time since last visit, based on ACC/AHA guidelines, Latasha Hodges would be at acceptable risk for the planned procedure without further cardiovascular testing.   Patient with diagnosis of afib on warfarin for anticoagulation.     Procedure: ORIF Date of procedure: 02/03/21   CHA2DS2-VASc Score = 5  This indicates a 7.2% annual risk of stroke. The patient's score is based upon: CHF History: 0 HTN History: 1 Diabetes History: 1 Stroke History: 0 Vascular Disease History: 1 Age Score: 1 Gender Score: 1   CrCl 77mL/min Platelet count 283K   Per office protocol, patient can hold warfarin for 5 days prior to procedure. See other phone note from [place], Coumadin RN regarding this. Does not appear MD knew pt was on warfarin. Warfarin is typically held for ORIF due to bleed risk. If pt does need to hold warfarin, procedure date would need to be moved.   Other phone note from ambulatory surgery today from [place] states "Called and spoke w/ Caryl Pina, [place] scheduler, requested cardiac clearance for Coumadin due to pt surgery scheduled for 02-03-2021 for Dr Greta Doom, stated ok will request was unaware pt on coumadin."  I will route this recommendation to the requesting party via [facility] fax function and remove from pre-op pool.  Please call with questions.  Jossie Ng. Yannet Rincon NP-C    02/02/2021, 7:58 AM [place] [address] Office [phone] Fax [phone]

## 2021-02-02 NOTE — Telephone Encounter (Signed)
Pt came in to have INR checked today, please refer to Anticoagulation Encounter for details. Also, pt will have ORIF surgery on 12/29/222 at [facility] and there is a surgical clearance in the chart that pt can hold Warfarin 5 days no bridge. Updated pt at appt and gave instructions post procedure as well. She will call if needed.

## 2021-02-04 ENCOUNTER — Other Ambulatory Visit: Payer: Self-pay | Admitting: Oncology

## 2021-02-04 ENCOUNTER — Other Ambulatory Visit: Payer: Self-pay | Admitting: *Deleted

## 2021-02-04 MED ORDER — CLOTRIMAZOLE-BETAMETHASONE 1-0.05 % EX CREA: 1.0000 "application " | TOPICAL_CREAM | Freq: Two times a day (BID) | CUTANEOUS | 0 refills | Status: DC

## 2021-02-04 MED ORDER — CLOTRIMAZOLE-BETAMETHASONE 1-0.05 % EX CREA: 3.0000 "application " | TOPICAL_CREAM | Freq: Two times a day (BID) | CUTANEOUS | 3 refills | Status: DC

## 2021-02-04 NOTE — Patient Instructions (Addendum)
DUE TO COVID-19 ONLY ONE VISITOR IS ALLOWED TO COME WITH YOU AND STAY IN THE WAITING ROOM ONLY DURING PRE OP AND PROCEDURE DAY OF SURGERY IF YOU ARE GOING HOME AFTER SURGERY. IF YOU ARE SPENDING THE NIGHT 2 PEOPLE MAY VISIT WITH YOU IN YOUR PRIVATE ROOM AFTER SURGERY UNTIL VISITING  HOURS ARE OVER AT 800 PM AND 1  VISITOR  MAY  SPEND THE NIGHT.                Latasha Servais     Your procedure is scheduled on: 02/10/21   Report to [facility] Main  Entrance   Report to admitting at 1:00 PM     Call this number if you have problems the morning of surgery [phone]    No food after midnight.    You may have clear liquid until 12:00 PM    At 11:30 AM drink pre surgery drink. The G2   Nothing by mouth after 12:00 PM     CLEAR LIQUID DIET   Foods Allowed                                                                     Foods Excluded  Coffee and tea, regular and decaf                             liquids that you cannot  Plain Jell-O any favor except red or purple                                           see through such as: Fruit ices (not with fruit pulp)                                     milk, soups, orange juice  Iced Popsicles                                    All solid food Carbonated beverages, regular and diet                                    Cranberry, grape and apple juices Sports drinks like Gatorade Lightly seasoned clear broth or consume(fat free) Sugar     BRUSH YOUR TEETH MORNING OF SURGERY AND RINSE YOUR MOUTH OUT, NO CHEWING GUM CANDY OR MINTS.     Take these medicines the morning of surgery with A SIP OF WATER: Levothyroxine   Stop taking _Coumadin__________on __12/24________as instructed by _Dr. Spears____________.     How to Manage Your Diabetes Before and After Surgery  Why is it important to control my blood sugar before and after surgery? Improving blood sugar levels before and after surgery helps healing and can limit  problems. A way of improving blood sugar control is eating a healthy diet by:  Eating less sugar and carbohydrates  Increasing  activity/exercise  Talking with your doctor about reaching your blood sugar goals High blood sugars (greater than 180 mg/dL) can raise your risk of infections and slow your recovery, so you will need to focus on controlling your diabetes during the weeks before surgery. Make sure that the doctor who takes care of your diabetes knows about your planned surgery including the date and location.  How do I manage my blood sugar before surgery? Check your blood sugar at least 4 times a day, starting 2 days before surgery, to make sure that the level is not too high or low. Check your blood sugar the morning of your surgery when you wake up and every 2 hours until you get to the Short Stay unit. If your blood sugar is less than 70 mg/dL, you will need to treat for low blood sugar: Do not take insulin. Treat a low blood sugar (less than 70 mg/dL) with  cup of clear juice (cranberry or apple), 4 glucose tablets, OR glucose gel. Recheck blood sugar in 15 minutes after treatment (to make sure it is greater than 70 mg/dL). If your blood sugar is not greater than 70 mg/dL on recheck, call [phone] for further instructions. Report your blood sugar to the short stay nurse when you get to Short Stay.  If you are admitted to the hospital after surgery: Your blood sugar will be checked by the staff and you will probably be given insulin after surgery (instead of oral diabetes medicines) to make sure you have good blood sugar levels. The goal for blood sugar control after surgery is 80-180 mg/dL.   WHAT DO I DO ABOUT MY DIABETES MEDICATION?  Do not take oral diabetes medicines (pills) the morning of surgery.                                  You may not have any metal on your body including hair pins and              piercings  Do not wear jewelry, make-up, lotions, powders or  perfumes, deodorant             Do not wear nail polish on your fingernails.  Do not shave  48 hours prior to surgery.                 Do not bring valuables to the hospital. [place].  Contacts, dentures or bridgework may not be worn into surgery.     Patients discharged the day of surgery will not be allowed to drive home.  IF YOU ARE HAVING SURGERY AND GOING HOME THE SAME DAY, YOU MUST HAVE AN ADULT TO DRIVE YOU HOME AND BE WITH YOU FOR 24 HOURS. YOU MAY GO HOME BY TAXI OR UBER OR ORTHERWISE, BUT AN ADULT MUST ACCOMPANY YOU HOME AND STAY WITH YOU FOR 24 HOURS.  Name and phone number of your driver:  Special Instructions: N/A              Please read over the following fact sheets you were given: _____________________________________________________________________             [facility] - Preparing for Surgery Before surgery, you can play an important role.  Because skin is not sterile, your skin needs to be as free of germs  as possible.  You can reduce the number of germs on your skin by washing with CHG (chlorahexidine gluconate) soap before surgery.  CHG is an antiseptic cleaner which kills germs and bonds with the skin to continue killing germs even after washing. Please DO NOT use if you have an allergy to CHG or antibacterial soaps.  If your skin becomes reddened/irritated stop using the CHG and inform your nurse when you arrive at Short Stay. Do not shave (including legs and underarms) for at least 48 hours prior to the first CHG shower.   Please follow these instructions carefully:  1.  Shower with CHG Soap the night before surgery and the  morning of Surgery.  2.  If you choose to wash your hair, wash your hair first as usual with your  normal  shampoo.  3.  After you shampoo, rinse your hair and body thoroughly to remove the  shampoo.                            4.  Use CHG as you would any other liquid soap.  You can apply chg  directly  to the skin and wash                       Gently with a scrungie or clean washcloth.  5.  Apply the CHG Soap to your body ONLY FROM THE NECK DOWN.   Do not use on face/ open                           Wound or open sores. Avoid contact with eyes, ears mouth and genitals (private parts).                       Wash face,  Genitals (private parts) with your normal soap.             6.  Wash thoroughly, paying special attention to the area where your surgery  will be performed.  7.  Thoroughly rinse your body with warm water from the neck down.  8.  DO NOT shower/wash with your normal soap after using and rinsing off  the CHG Soap.                9.  Pat yourself dry with a clean towel.            10.  Wear clean pajamas.            11.  Place clean sheets on your bed the night of your first shower and do not  sleep with pets. Day of Surgery : Do not apply any lotions/deodorants the morning of surgery.  Please wear clean clothes to the hospital/surgery center.  FAILURE TO FOLLOW THESE INSTRUCTIONS MAY RESULT IN THE CANCELLATION OF YOUR SURGERY PATIENT SIGNATURE_________________________________  NURSE SIGNATURE__________________________________  ________________________________________________________________________

## 2021-02-07 NOTE — H&P (Signed)
Preoperative History & Physical Exam  Surgeon: Matt Holmes, MD  Diagnosis: Left wrist fracture  Planned Procedure: Procedure(s) (LRB): OPEN REDUCTION INTERNAL FIXATION (ORIF) WRIST FRACTURE (Left)  History of Present Illness:   Patient is a 75 y.o. female with symptoms consistent with  Left wrist fracture who presents for surgical intervention. The risks, benefits and alternatives of surgical intervention were discussed and informed consent was obtained prior to surgery.  Past Medical History:  Past Medical History:  Diagnosis Date   Acquired solitary kidney    left side;   s/p right nephrectomy 12/ 2021   Anemia    Anticoagulated on Coumadin    long-term;  managed by cardiology   Arthritis    Bronchitis, chronic, simple ([place])    Chronic kidney disease (CKD), stage III (moderate) ([place])    Hyperlipidemia, mixed    Hypertension    Left wrist fracture    Malignant neoplasm of kidney, right ([place]) 01/2020   oncologist--- dr Alen Blew;  dx 12/ 2021  Stage IIIa,  Clear cell type;  01-29-2020  s/p robotic right radical nephrectomy (one posisitve node);  chemo 03-31-2020 to  08-11-2020   Persistent atrial fibrillation [facility])    cardiologist--- dr t. turner;  nuclear sress test-- 08-15-2017 low risk normal perfusion, nuclear ef 61%;  event monitor 12-18-2017 in epic   Pulmonary nodules    RBBB (right bundle branch block)    chronic   Type 2 diabetes mellitus ([place])     Past Surgical History:  Past Surgical History:  Procedure Laterality Date   COLONOSCOPY  12/29/2010   Procedure: COLONOSCOPY;  Surgeon: Owens Loffler, MD;  [facility];  Service: Endoscopy;  Laterality: N/A;   IR IMAGING GUIDED PORT INSERTION  07/08/2020   LUMBAR LAMINECTOMY/DECOMPRESSION MICRODISCECTOMY Left 07/14/2020   Procedure: Laminectomy and Foraminotomy - Lumbar three-Lumbar four- Lumbar four-Lumbar five - left with sublaminar decompression;  Surgeon: Eustace Moore, MD;  Location: [place];  Service:  Neurosurgery;  Laterality: Left;   ROBOT ASSISTED LAPAROSCOPIC NEPHRECTOMY N/A 01/29/2020   Procedure: XI ROBOTIC ASSISTED LAPAROSCOPIC RADICAL NEPHRECTOMY;  Surgeon: Raynelle Bring, MD;  [facility];  Service: Urology;  Laterality: N/A;   TUBAL LIGATION  1980    Medications:  Prior to Admission medications   Medication Sig Start Date End Date Taking? Authorizing Provider  clotrimazole-betamethasone (LOTRISONE) cream Apply 1 application topically 2 (two) times daily. May use with OTC zinc oxide cream. 06/04/20  Yes Tanner, Lyndon Code., PA-C  diphenhydrAMINE-zinc acetate (BENADRYL) cream Apply 1 application topically 3 (three) times daily as needed for itching.   Yes [provider]  Dulaglutide (TRULICITY) 1.5 ZG/0.1VC SOPN Inject 1.5 mg into the skin every Thursday.   Yes [provider]  fenofibrate micronized (LOFIBRA) 200 MG capsule TAKE 1 CAPSULE BY MOUTH DAILY BEFORE AND BREAKFAST 01/20/21  Yes Turner, Eber Hong, MD  flecainide (TAMBOCOR) 50 MG tablet TAKE 1 TABLET(50 MG) BY MOUTH TWICE DAILY 04/28/19  Yes Turner, Eber Hong, MD  levothyroxine (SYNTHROID) 75 MCG tablet Take 75 mcg by mouth every morning. 01/04/21  Yes [provider]  lidocaine-prilocaine (EMLA) cream Apply 1 application topically as needed. 08/04/20  Yes Wyatt Portela, MD  lisinopril (ZESTRIL) 20 MG tablet TAKE 1 TABLET(20 MG) BY MOUTH DAILY 04/21/20  Yes Turner, Eber Hong, MD  magnesium gluconate (MAGONATE) 500 MG tablet Take 500 mg by mouth daily.   Yes [provider]  metFORMIN (GLUCOPHAGE) 1000 MG tablet Take 1,000 mg by mouth 2 (two)  times daily. 01/07/14  Yes [provider]  methocarbamol (ROBAXIN) 500 MG tablet Take 1 tablet (500 mg total) by mouth 4 (four) times daily. Patient taking differently: Take 500 mg by mouth every 6 (six) hours as needed for muscle spasms. 07/15/20  Yes Meyran, Ocie Cornfield, NP  metoprolol succinate (TOPROL-XL) 50 MG 24 hr tablet TAKE 1 TABLET(50 MG)  BY MOUTH DAILY Patient taking differently: Take 50 mg by mouth daily. 05/19/20  Yes Turner, Eber Hong, MD  prochlorperazine (COMPAZINE) 10 MG tablet Take 1 tablet (10 mg total) by mouth every 6 (six) hours as needed for nausea or vomiting. 03/17/20  Yes Wyatt Portela, MD  simvastatin (ZOCOR) 40 MG tablet TAKE 1 TABLET(40 MG) BY MOUTH DAILY AT 6 PM 03/26/20  Yes Turner, Eber Hong, MD  warfarin (COUMADIN) 5 MG tablet TAKE AS DIRECTED BY COUMADIN CLINIC Patient taking differently: Take 2.5-5 mg by mouth See admin instructions. Take 2.5 mg by mouth every other day, alternating with 5 mg on alternate days 10/27/20  Yes Turner, Eber Hong, MD  clotrimazole-betamethasone (LOTRISONE) cream Apply 3 application topically 2 (two) times daily. 02/04/21   Wyatt Portela, MD  clotrimazole-betamethasone (LOTRISONE) cream Apply 1 application topically 2 (two) times daily. 02/04/21   Wyatt Portela, MD  HYDROcodone-acetaminophen (NORCO/VICODIN) 5-325 MG tablet Take 1 tablet by mouth every 4 (four) hours as needed for moderate pain. Patient not taking: Reported on 02/02/2021 07/15/20 07/15/21  Eleonore Chiquito, NP  hydrOXYzine (ATARAX/VISTARIL) 10 MG tablet Take 1 tablet (10 mg total) by mouth 3 (three) times daily as needed. Patient not taking: Reported on 02/02/2021 08/04/20   Wyatt Portela, MD  levothyroxine (SYNTHROID) 25 MCG tablet Take 1 tablet (25 mcg total) by mouth daily before breakfast. Patient not taking: Reported on 02/02/2021 09/23/20   Wyatt Portela, MD  methylPREDNISolone (MEDROL DOSEPAK) 4 MG TBPK tablet Take 6 tablets day 1, then Take 5 tablets day 2, Take 4 tablets day 3 Take 3 tablets day 4, Take 2 tablets day 5 Take 1 tablet  day 6 and then stop. Patient not taking: Reported on 09/03/2020 09/02/20   Wyatt Portela, MD  triamcinolone ointment (KENALOG) 0.5 % Apply 1 application topically 2 (two) times daily. Patient not taking: Reported on 02/02/2021 11/05/20   Wyatt Portela, MD    Allergies:   Iodinated contrast media, No healthtouch food allergies, Penicillins, Sulfa antibiotics, and Theophyllines  Review of Systems: Negative except per HPI.  Physical Exam: Alert and oriented, NAD Head and neck: no masses, normal alignment CV: pulse intact Pulm: no increased work of breathing, respirations even and unlabored Abdomen: non-distended Extremities: extremities warm and well perfused  LABS: Recent Results (from the past 2160 hour(s))  POCT INR     Status: Abnormal   Collection Time: 11/29/20  3:25 PM  Result Value Ref Range   INR 1.8 (A) 2.0 - 3.0  POCT INR     Status: Abnormal   Collection Time: 12/22/20  2:47 PM  Result Value Ref Range   INR 3.4 (A) 2.0 - 3.0  TSH     Status: Abnormal   Collection Time: 01/07/21  1:40 PM  Result Value Ref Range   TSH 35.586 (H) 0.308 - 3.960 uIU/mL    Comment: Performed at [facility], [place] [address] [place], [place] [address]  CMP ([place] only)     Status: Abnormal   Collection Time: 01/07/21  1:40 PM  Result Value Ref  Range   Sodium 140 135 - 145 mmol/L   Potassium 4.2 3.5 - 5.1 mmol/L   Chloride 106 98 - 111 mmol/L   CO2 25 22 - 32 mmol/L   Glucose, Bld 150 (H) 70 - 99 mg/dL    Comment: Glucose reference range applies only to samples taken after fasting for at least 8 hours.   BUN 31 (H) 8 - 23 mg/dL   Creatinine 1.25 (H) 0.44 - 1.00 mg/dL   Calcium 9.3 8.9 - 10.3 mg/dL   Total Protein 6.7 6.5 - 8.1 g/dL   Albumin 3.4 (L) 3.5 - 5.0 g/dL   AST 26 15 - 41 U/L   ALT 21 0 - 44 U/L   Alkaline Phosphatase 49 38 - 126 U/L   Total Bilirubin 0.3 0.3 - 1.2 mg/dL   GFR, Estimated 46 (L) >60 mL/min    Comment: (NOTE) Calculated using the CKD-EPI Creatinine Equation (2021)    Anion gap 9 5 - 15    Comment: Performed at [facility], [place] [address] [place], [place] [address]  CBC with Differential ([place] Only)     Status: Abnormal   Collection Time: 01/07/21  1:40 PM   Result Value Ref Range   WBC Count 6.3 4.0 - 10.5 K/uL   RBC 3.52 (L) 3.87 - 5.11 MIL/uL   Hemoglobin 9.8 (L) 12.0 - 15.0 g/dL   HCT 30.6 (L) 36.0 - 46.0 %   MCV 86.9 80.0 - 100.0 fL   MCH 27.8 26.0 - 34.0 pg   MCHC 32.0 30.0 - 36.0 g/dL   RDW 14.8 11.5 - 15.5 %   Platelet Count 283 150 - 400 K/uL   nRBC 0.0 0.0 - 0.2 %   Neutrophils Relative % 56 %   Neutro Abs 3.5 1.7 - 7.7 K/uL   Lymphocytes Relative 28 %   Lymphs Abs 1.8 0.7 - 4.0 K/uL   Monocytes Relative 10 %   Monocytes Absolute 0.6 0.1 - 1.0 K/uL   Eosinophils Relative 4 %   Eosinophils Absolute 0.3 0.0 - 0.5 K/uL   Basophils Relative 1 %   Basophils Absolute 0.1 0.0 - 0.1 K/uL   Immature Granulocytes 1 %   Abs Immature Granulocytes 0.05 0.00 - 0.07 K/uL    Comment: Performed at [facility], [place] [address] [place], [place] [address]  POCT INR     Status: Abnormal   Collection Time: 01/14/21  3:32 PM  Result Value Ref Range   INR 3.3 (A) 2.0 - 3.0  POCT INR     Status: None   Collection Time: 02/02/21  3:42 PM  Result Value Ref Range   INR 2.6 2.0 - 3.0     Complete History and Physical exam available in the office notes  [name]

## 2021-02-08 ENCOUNTER — Other Ambulatory Visit: Payer: Self-pay

## 2021-02-08 ENCOUNTER — Encounter (HOSPITAL_COMMUNITY): Payer: Self-pay

## 2021-02-08 ENCOUNTER — Inpatient Hospital Stay (HOSPITAL_COMMUNITY)
Admission: RE | Admit: 2021-02-08 | Discharge: 2021-02-08 | Disposition: A | Discharge status: Home / Self Care | Payer: Medicare Other | Source: Ambulatory Visit | Attending: Orthopedic Surgery | Admitting: Orthopedic Surgery

## 2021-02-08 VITALS — Ht 60.0 in | Wt 200.0 lb

## 2021-02-08 DIAGNOSIS — I10 Essential (primary) hypertension: Secondary | ICD-10-CM

## 2021-02-08 NOTE — Progress Notes (Addendum)
COVID test- NA   PCP - Dr. Lady Deutscher Cardiologist - Dr. Ashok Norris  Chest x-ray - no EKG - 02/09/21 -chart Stress Test - no ECHO - 2013 Cardiac Cath - no Pacemaker/ICD device last checked:NA  Sleep Study - no CPAP -   Fasting Blood Sugar - 120-160 Checks Blood Sugar _____ times a day. Once a week  Blood Thinner Instructions:Coumadin Dr. [name] Aspirin Instructions:Stop 5 days prior to DOS Last Dose:02/05/21  Anesthesia review: yes  Patient denies shortness of breath, fever, cough and chest pain at PAT appointment Pt has no SOB with limited activity. She has a port-a-cath in the upper rt Chest. She only has 1 kidney.  Patient verbalized understanding of instructions that were given to them at the PAT appointment. Patient was also instructed that they will need to review over the PAT instructions again at home before surgery. Pt was a no show for her PAT visit. 2 messages were left on her phone.yes

## 2021-02-09 ENCOUNTER — Other Ambulatory Visit: Payer: Self-pay

## 2021-02-09 ENCOUNTER — Encounter (HOSPITAL_COMMUNITY)
Admission: RE | Admit: 2021-02-09 | Discharge: 2021-02-09 | Disposition: A | Discharge status: Home / Self Care | Payer: No Typology Code available for payment source | Source: Ambulatory Visit | Attending: Orthopedic Surgery | Admitting: Orthopedic Surgery

## 2021-02-09 DIAGNOSIS — E119 Type 2 diabetes mellitus without complications: Secondary | ICD-10-CM | POA: Insufficient documentation

## 2021-02-09 DIAGNOSIS — X58XXXA Exposure to other specified factors, initial encounter: Secondary | ICD-10-CM | POA: Diagnosis not present

## 2021-02-09 DIAGNOSIS — Z01818 Encounter for other preprocedural examination: Secondary | ICD-10-CM | POA: Diagnosis present

## 2021-02-09 DIAGNOSIS — I10 Essential (primary) hypertension: Secondary | ICD-10-CM

## 2021-02-09 DIAGNOSIS — M1990 Unspecified osteoarthritis, unspecified site: Secondary | ICD-10-CM | POA: Diagnosis not present

## 2021-02-09 DIAGNOSIS — Z87891 Personal history of nicotine dependence: Secondary | ICD-10-CM | POA: Diagnosis not present

## 2021-02-09 DIAGNOSIS — S62102A Fracture of unspecified carpal bone, left wrist, initial encounter for closed fracture: Secondary | ICD-10-CM | POA: Diagnosis present

## 2021-02-09 DIAGNOSIS — Z20822 Contact with and (suspected) exposure to covid-19: Secondary | ICD-10-CM | POA: Diagnosis not present

## 2021-02-09 DIAGNOSIS — E039 Hypothyroidism, unspecified: Secondary | ICD-10-CM | POA: Diagnosis not present

## 2021-02-09 LAB — BASIC METABOLIC PANEL
Anion gap: 6 (ref 5–15)
BUN: 36 mg/dL — ABNORMAL HIGH (ref 8–23)
CO2: 26 mmol/L (ref 22–32)
Calcium: 9.3 mg/dL (ref 8.9–10.3)
Chloride: 105 mmol/L (ref 98–111)
Creatinine, Ser: 0.97 mg/dL (ref 0.44–1.00)
GFR, Estimated: >60 mL/min (ref >60)
Glucose, Bld: 131 mg/dL — ABNORMAL HIGH (ref 70–99)
Potassium: 4.6 mmol/L (ref 3.5–5.1)
Sodium: 137 mmol/L (ref 135–145)

## 2021-02-09 LAB — CBC
HCT: 34.1 % — ABNORMAL LOW (ref 36.0–46.0)
Hemoglobin: 10.6 g/dL — ABNORMAL LOW (ref 12.0–15.0)
MCH: 28 pg (ref 26.0–34.0)
MCHC: 31.1 g/dL (ref 30.0–36.0)
MCV: 90 fL (ref 80.0–100.0)
Platelets: 259 10*3/uL (ref 150–400)
RBC: 3.79 MIL/uL — ABNORMAL LOW (ref 3.87–5.11)
RDW: 14 % (ref 11.5–15.5)
WBC: 5.1 10*3/uL (ref 4.0–10.5)
nRBC: 0 % (ref 0.0–0.2)

## 2021-02-09 LAB — HEMOGLOBIN A1C
Hgb A1c MFr Bld: 7.2 % — ABNORMAL HIGH (ref 4.8–5.6)
Mean Plasma Glucose: 159.94 mg/dL

## 2021-02-09 LAB — SARS CORONAVIRUS 2 (TAT 6-24 HRS): SARS Coronavirus 2: NEGATIVE

## 2021-02-09 LAB — GLUCOSE, CAPILLARY: Glucose-Capillary: 116 mg/dL — ABNORMAL HIGH (ref 70–99)

## 2021-02-09 NOTE — Anesthesia Preprocedure Evaluation (Addendum)
Anesthesia Evaluation  Patient identified by MRN, date of birth, ID band Patient awake    Reviewed: Allergy & Precautions, NPO status , Patient's Chart, lab work & pertinent test results, reviewed documented beta blocker date and time   History of Anesthesia Complications Negative for: history of anesthetic complications  Airway Mallampati: III  TM Distance: >3 FB Neck ROM: Full    Dental  (+) Dental Advisory Given, Teeth Intact   Pulmonary neg shortness of breath, neg sleep apnea, neg COPD, Recent URI , former smoker,    breath sounds clear to auscultation       Cardiovascular hypertension, Pt. on medications + dysrhythmias Atrial Fibrillation  Rhythm:Regular     Neuro/Psych negative neurological ROS  negative psych ROS   GI/Hepatic negative GI ROS, Neg liver ROS,   Endo/Other  diabetesHypothyroidism   Renal/GU Renal diseaseLab Results      Component                Value               Date                      CREATININE               0.97                02/09/2021                Musculoskeletal  (+) Arthritis , Left wrist fracture   Abdominal   Peds  Hematology negative hematology ROS (+)   Anesthesia Other Findings   Reproductive/Obstetrics                            Anesthesia Physical Anesthesia Plan  ASA: 2  Anesthesia Plan: MAC and Regional   Post-op Pain Management: Regional block   Induction:   PONV Risk Score and Plan: 2 and Propofol infusion and Treatment may vary due to age or medical condition  Airway Management Planned: Nasal Cannula  Additional Equipment: None  Intra-op Plan:   Post-operative Plan:   Informed Consent: I have reviewed the patients History and Physical, chart, labs and discussed the procedure including the risks, benefits and alternatives for the proposed anesthesia with the patient or authorized representative who has indicated his/her  understanding and acceptance.     Dental advisory given  Plan Discussed with: CRNA and Anesthesiologist  Anesthesia Plan Comments: (See PAT note 02/08/21, Konrad Felix Ward, PA-C)       Anesthesia Quick Evaluation

## 2021-02-09 NOTE — Progress Notes (Signed)
Anesthesia Chart Review   Case: [id number] Date/Time: 02/10/21 1459   Procedure: OPEN REDUCTION INTERNAL FIXATION (ORIF) WRIST FRACTURE (Left: Wrist) - with MAC   Anesthesia type: Regional   Pre-op diagnosis: Left wrist fracture   [facility]   Surgeons: [name], MD       DISCUSSION:75 y.o. former smoker with h/o HTN, RBBB, a-fib (on Coumadin, last dose 02/05/21), DM II, CKD Stage III, kidney cancer, solitary kidney s/p right nephrectomy 01/2020, left wrist fracture scheduled for above procedure 02/10/21 with Dr. [name].   Per cardiology preoperative evaluation 02/02/2021, "Chart reviewed as part of pre-operative protocol coverage. Given past medical history and time since last visit, based on ACC/AHA guidelines, Latasha Hodges would be at acceptable risk for the planned procedure without further cardiovascular testing.    Patient with diagnosis of afib on warfarin for anticoagulation.     Procedure: ORIF Date of procedure: 02/03/21   CHA2DS2-VASc Score = 5  This indicates a 7.2% annual risk of stroke. The patient's score is based upon: CHF History: 0 HTN History: 1 Diabetes History: 1 Stroke History: 0 Vascular Disease History: 1 Age Score: 1 Gender Score: 1   CrCl 12m/min Platelet count 283K   Per office protocol, patient can hold warfarin for 5 days prior to procedure. See other phone note from [facility] regarding this. Does not appear MD knew pt was on warfarin. Warfarin is typically held for ORIF due to bleed risk. If pt does need to hold warfarin, procedure date would need to be moved."  Anticipate pt can proceed with planned procedure barring acute status change.   VS: Ht 5' (1.524 m)    Wt 90.7 kg    BMI 39.06 kg/m   PROVIDERS: GKelton Pillar MD is PCP   TFransico Him MD is Cardiologist  LABS:  labs DOS (all labs ordered are listed, but only abnormal results are displayed)  Labs Reviewed - No data to  display   IMAGES:   EKG: 07/07/20 Rate 60 bpm  Normal sinus rhythm Right bundle branch block Abnormal ECG No previous tracing  CV: Myocardial Perfusion 08/25/2017 The left ventricular ejection fraction is normal (55-65%). Nuclear stress EF: 61%. There was no ST segment deviation noted during stress. The study is normal. This is a low risk study.   Normal resting and stress perfusion. No ischemia or infarction EF 61% Past Medical History:  Diagnosis Date   Acquired solitary kidney    left side;   s/p right nephrectomy 12/ 2021   Anticoagulated on Coumadin    long-term;  managed by cardiology   Arthritis    Chronic kidney disease (CKD), stage III (moderate) ([facility])    Hyperlipidemia, mixed    Hypertension    Left wrist fracture    [place]   oncologist--- dr sAlen Blew  dx 12/ 2021  Stage IIIa,  Clear cell type;  01-29-2020  s/p robotic right radical nephrectomy (one posisitve node);  chemo 03-31-2020 to  08-11-2020   Persistent [facility]    cardiologist--- dr t. turner;  nuclear sress test-- 08-15-2017 low risk normal perfusion, nuclear ef 61%;  event monitor 12-18-2017 in epic   RBBB (right bundle branch block)    chronic   [place] 2 diabetes mellitus ([place]     Past Surgical History:  Procedure Laterality Date   COLONOSCOPY  12/29/2010   Procedure: COLONOSCOPY;  Surgeon: DOwens Loffler MD;  [facility];  Service: Endoscopy;  Laterality: N/A;   IR IMAGING GUIDED PORT INSERTION  07/08/2020   LUMBAR LAMINECTOMY/DECOMPRESSION MICRODISCECTOMY Left 07/14/2020   Procedure: Laminectomy and Foraminotomy - Lumbar three-Lumbar four- Lumbar four-Lumbar five - left with sublaminar decompression;  Surgeon: Eustace Moore, MD;  Location: [place];  Service: Neurosurgery;  Laterality: Left;   ROBOT ASSISTED LAPAROSCOPIC NEPHRECTOMY N/A 01/29/2020   Procedure: XI ROBOTIC ASSISTED LAPAROSCOPIC RADICAL NEPHRECTOMY;  Surgeon: Raynelle Bring, MD;  [facility];  Service: Urology;  Laterality: N/A;   TUBAL LIGATION  1980    MEDICATIONS:  clotrimazole-betamethasone (LOTRISONE) cream   clotrimazole-betamethasone (LOTRISONE) cream   clotrimazole-betamethasone (LOTRISONE) cream   diphenhydrAMINE-zinc acetate (BENADRYL) cream   Dulaglutide (TRULICITY) 1.5 IO/0.3TD SOPN   fenofibrate micronized (LOFIBRA) 200 MG capsule   flecainide (TAMBOCOR) 50 MG tablet   HYDROcodone-acetaminophen (NORCO/VICODIN) 5-325 MG tablet   hydrOXYzine (ATARAX/VISTARIL) 10 MG tablet   levothyroxine (SYNTHROID) 25 MCG tablet   levothyroxine (SYNTHROID) 75 MCG tablet   lidocaine-prilocaine (EMLA) cream   lisinopril (ZESTRIL) 20 MG tablet   magnesium gluconate (MAGONATE) 500 MG tablet   metFORMIN (GLUCOPHAGE) 1000 MG tablet   methocarbamol (ROBAXIN) 500 MG tablet   methylPREDNISolone (MEDROL DOSEPAK) 4 MG TBPK tablet   metoprolol succinate (TOPROL-XL) 50 MG 24 hr tablet   prochlorperazine (COMPAZINE) 10 MG tablet   simvastatin (ZOCOR) 40 MG tablet   triamcinolone ointment (KENALOG) 0.5 %   warfarin (COUMADIN) 5 MG tablet   No current facility-administered medications for this encounter.     Konrad Felix Ward, PA-C [facility] [phone]

## 2021-02-10 ENCOUNTER — Encounter (HOSPITAL_COMMUNITY)
Admission: RE | Disposition: A | Discharge status: Home / Self Care | Payer: Self-pay | Source: Ambulatory Visit | Attending: Orthopedic Surgery

## 2021-02-10 ENCOUNTER — Ambulatory Visit (HOSPITAL_COMMUNITY)
Admission: RE | Admit: 2021-02-10 | Discharge: 2021-02-10 | Disposition: A | Discharge status: Home / Self Care | Payer: No Typology Code available for payment source | Source: Ambulatory Visit | Attending: Orthopedic Surgery | Admitting: Orthopedic Surgery

## 2021-02-10 ENCOUNTER — Ambulatory Visit (HOSPITAL_COMMUNITY): Payer: No Typology Code available for payment source | Admitting: Physician Assistant

## 2021-02-10 ENCOUNTER — Encounter (HOSPITAL_COMMUNITY): Payer: Self-pay | Admitting: Orthopedic Surgery

## 2021-02-10 ENCOUNTER — Other Ambulatory Visit: Payer: Self-pay

## 2021-02-10 DIAGNOSIS — Z20822 Contact with and (suspected) exposure to covid-19: Secondary | ICD-10-CM | POA: Insufficient documentation

## 2021-02-10 DIAGNOSIS — E039 Hypothyroidism, unspecified: Secondary | ICD-10-CM | POA: Diagnosis not present

## 2021-02-10 DIAGNOSIS — I10 Essential (primary) hypertension: Secondary | ICD-10-CM | POA: Insufficient documentation

## 2021-02-10 DIAGNOSIS — M1990 Unspecified osteoarthritis, unspecified site: Secondary | ICD-10-CM | POA: Insufficient documentation

## 2021-02-10 DIAGNOSIS — Z01818 Encounter for other preprocedural examination: Secondary | ICD-10-CM

## 2021-02-10 DIAGNOSIS — S62102A Fracture of unspecified carpal bone, left wrist, initial encounter for closed fracture: Secondary | ICD-10-CM | POA: Diagnosis not present

## 2021-02-10 DIAGNOSIS — E119 Type 2 diabetes mellitus without complications: Secondary | ICD-10-CM | POA: Diagnosis not present

## 2021-02-10 DIAGNOSIS — Z87891 Personal history of nicotine dependence: Secondary | ICD-10-CM | POA: Insufficient documentation

## 2021-02-10 DIAGNOSIS — X58XXXA Exposure to other specified factors, initial encounter: Secondary | ICD-10-CM | POA: Insufficient documentation

## 2021-02-10 HISTORY — DX: Chronic kidney disease, stage 3 unspecified: N18.30

## 2021-02-10 HISTORY — PX: ORIF WRIST FRACTURE: SHX2133

## 2021-02-10 HISTORY — DX: Type 2 diabetes mellitus without complications: E11.9

## 2021-02-10 HISTORY — DX: Acquired absence of kidney: Z90.5

## 2021-02-10 HISTORY — DX: Mixed hyperlipidemia: E78.2

## 2021-02-10 HISTORY — DX: Fracture of unspecified carpal bone, left wrist, initial encounter for closed fracture: S62.102A

## 2021-02-10 HISTORY — DX: Long term (current) use of anticoagulants: Z79.01

## 2021-02-10 LAB — GLUCOSE, CAPILLARY: Glucose-Capillary: 120 mg/dL — ABNORMAL HIGH (ref 70–99)

## 2021-02-10 SURGERY — OPEN REDUCTION INTERNAL FIXATION (ORIF) WRIST FRACTURE
Anesthesia: Monitor Anesthesia Care | Site: Wrist | Laterality: Left

## 2021-02-10 MED ORDER — CHLORHEXIDINE GLUCONATE 0.12 % MT SOLN
15.0000 mL | Freq: Once | OROMUCOSAL | Status: AC
  Administered 2021-02-10: 14:00:00 15 mL via OROMUCOSAL

## 2021-02-10 MED ORDER — BUPIVACAINE-EPINEPHRINE (PF) 0.5% -1:200000 IJ SOLN
INTRAMUSCULAR | Status: DC | PRN
  Administered 2021-02-10: 30 mL via PERINEURAL

## 2021-02-10 MED ORDER — BUPIVACAINE HCL (PF) 0.5 % IJ SOLN
INTRAMUSCULAR | Status: AC
  Filled 2021-02-10: qty 30

## 2021-02-10 MED ORDER — PROPOFOL 10 MG/ML IV BOLUS
INTRAVENOUS | Status: AC
  Filled 2021-02-10: qty 20

## 2021-02-10 MED ORDER — PROPOFOL 500 MG/50ML IV EMUL
INTRAVENOUS | Status: DC | PRN
  Administered 2021-02-10: 75 ug/kg/min via INTRAVENOUS

## 2021-02-10 MED ORDER — PROPOFOL 500 MG/50ML IV EMUL
INTRAVENOUS | Status: DC | PRN
  Administered 2021-02-10: 18:00:00 30 mg via INTRAVENOUS
  Administered 2021-02-10: 18:00:00 50 mg via INTRAVENOUS

## 2021-02-10 MED ORDER — ORAL CARE MOUTH RINSE: 15.0000 mL | Freq: Once | OROMUCOSAL | Status: AC

## 2021-02-10 MED ORDER — CEFAZOLIN SODIUM-DEXTROSE 2-4 GM/100ML-% IV SOLN
2.0000 g | INTRAVENOUS | Status: DC
  Filled 2021-02-10: qty 100

## 2021-02-10 MED ORDER — LIDOCAINE-EPINEPHRINE 2 %-1:100000 IJ SOLN
INTRAMUSCULAR | Status: DC | PRN
  Administered 2021-02-10: 10 mL via PERINEURAL

## 2021-02-10 MED ORDER — FENTANYL CITRATE PF 50 MCG/ML IJ SOSY
50.0000 ug | PREFILLED_SYRINGE | INTRAMUSCULAR | Status: DC
  Administered 2021-02-10: 15:00:00 75 ug via INTRAVENOUS
  Filled 2021-02-10: qty 2

## 2021-02-10 MED ORDER — LACTATED RINGERS IV SOLN: INTRAVENOUS | Status: DC

## 2021-02-10 MED ORDER — ONDANSETRON HCL 4 MG/2ML IJ SOLN
INTRAMUSCULAR | Status: DC | PRN
  Administered 2021-02-10: 4 mg via INTRAVENOUS

## 2021-02-10 MED ORDER — PROPOFOL 500 MG/50ML IV EMUL
INTRAVENOUS | Status: AC
  Filled 2021-02-10: qty 50

## 2021-02-10 MED ORDER — HYDROCODONE-ACETAMINOPHEN 5-325 MG PO TABS: 1.0000 | ORAL_TABLET | Freq: Four times a day (QID) | ORAL | 0 refills | Status: AC | PRN

## 2021-02-10 SURGICAL SUPPLY — 69 items
BAG COUNTER SPONGE SURGICOUNT (BAG) IMPLANT
BAG ZIPLOCK 12X15 (MISCELLANEOUS) ×2 IMPLANT
BIT DRILL 2 FAST STEP (BIT) ×1 IMPLANT
BIT DRILL 2.2 SS TIBIAL (BIT) ×1 IMPLANT
BNDG ELASTIC 4X5.8 VLCR STR LF (GAUZE/BANDAGES/DRESSINGS) ×2 IMPLANT
BNDG GAUZE ELAST 4 BULKY (GAUZE/BANDAGES/DRESSINGS) ×2 IMPLANT
COVER SURGICAL LIGHT HANDLE (MISCELLANEOUS) ×1 IMPLANT
CUFF TOURN SGL QUICK 18X4 (TOURNIQUET CUFF) ×2 IMPLANT
DECANTER SPIKE VIAL GLASS SM (MISCELLANEOUS) ×1 IMPLANT
DRAPE OEC MINIVIEW 54X84 (DRAPES) ×2 IMPLANT
DRAPE U-SHAPE 47X51 STRL (DRAPES) ×2 IMPLANT
DRIVER BIT SQUARE 1.7/2.2 (TRAUMA) ×1 IMPLANT
DRSG ADAPTIC 3X8 NADH LF (GAUZE/BANDAGES/DRESSINGS) ×1 IMPLANT
DRSG PAD ABDOMINAL 8X10 ST (GAUZE/BANDAGES/DRESSINGS) ×2 IMPLANT
ELECT REM PT RETURN 15FT ADLT (MISCELLANEOUS) ×1 IMPLANT
EVACUATOR 1/8 PVC DRAIN (DRAIN) IMPLANT
GAUZE 4X4 16PLY ~~LOC~~+RFID DBL (SPONGE) ×4 IMPLANT
GAUZE SPONGE 4X4 12PLY STRL (GAUZE/BANDAGES/DRESSINGS) ×2 IMPLANT
GLOVE SURG ENC MOIS LTX SZ7 (GLOVE) ×2 IMPLANT
GLOVE SURG UNDER LTX SZ7.5 (GLOVE) ×2 IMPLANT
GLOVE SURG UNDER POLY LF SZ7.5 (GLOVE) ×2 IMPLANT
GOWN STRL REUS W/TWL LRG LVL3 (GOWN DISPOSABLE) ×2 IMPLANT
K-WIRE 1.6 (WIRE) ×4
K-WIRE DBL TROCAR .062X4 (WIRE)
K-WIRE FX5X1.6XNS BN SS (WIRE) ×4
KIT BASIN OR (CUSTOM PROCEDURE TRAY) ×2 IMPLANT
KIT TURNOVER KIT A (KITS) IMPLANT
KWIRE DBL TROCAR .062X4 (WIRE) IMPLANT
KWIRE FX5X1.6XNS BN SS (WIRE) IMPLANT
MANIFOLD NEPTUNE II (INSTRUMENTS) ×2 IMPLANT
NS IRRIG 1000ML POUR BTL (IV SOLUTION) ×2 IMPLANT
PACK ORTHO EXTREMITY (CUSTOM PROCEDURE TRAY) ×2 IMPLANT
PAD CAST 3X4 CTTN HI CHSV (CAST SUPPLIES) ×1 IMPLANT
PAD CAST 4YDX4 CTTN HI CHSV (CAST SUPPLIES) ×1 IMPLANT
PADDING CAST COTTON 3X4 STRL (CAST SUPPLIES) ×1
PADDING CAST COTTON 4X4 STRL (CAST SUPPLIES) ×1
PENCIL SMOKE EVACUATOR (MISCELLANEOUS) IMPLANT
PLATE DVR CROSSLOCK 180 WRIST (Plate) ×1 IMPLANT
PLATE F3 FRAG HI STR RT (Plate) ×1 IMPLANT
PROTECTOR NERVE ULNAR (MISCELLANEOUS) ×2 IMPLANT
SCREW  LP NL 2.7X10MM (Screw) ×1 IMPLANT
SCREW  LP NL 2.7X11MM (Screw) ×1 IMPLANT
SCREW 2.7X12MM (Screw) ×1 IMPLANT
SCREW 2.7X14MM (Screw) ×2 IMPLANT
SCREW BN 14X2.7XNONLOCK 3 LD (Screw) IMPLANT
SCREW LOCK 14X2.7X 3 LD TPR (Screw) IMPLANT
SCREW LOCKING 2.7X11MM (Screw) ×2 IMPLANT
SCREW LOCKING 2.7X13MM (Screw) ×1 IMPLANT
SCREW LOCKING 2.7X14 (Screw) ×1 IMPLANT
SCREW LP NL 2.7X10MM (Screw) IMPLANT
SCREW LP NL 2.7X11MM (Screw) IMPLANT
SCREW PEG 2.5X16 NONLOCK (Screw) ×1 IMPLANT
SCREW PEG 2.5X18 NONLOCK (Screw) ×1 IMPLANT
SCREW PEG LOCK 2.5X14 (Peg) ×1 IMPLANT
SUT BONE WAX W31G (SUTURE) ×1 IMPLANT
SUT ETHILON 6 0 PS 3 18 (SUTURE) ×2 IMPLANT
SUT MERSILENE 4 0 P 3 (SUTURE) ×1 IMPLANT
SUT MNCRL AB 4-0 PS2 18 (SUTURE) ×1 IMPLANT
SUT PROLENE 3 0 PS 2 (SUTURE) ×2 IMPLANT
SUT PROLENE 4 0 P 3 18 (SUTURE) ×2 IMPLANT
SUT PROLENE 4 0 RB 1 (SUTURE)
SUT PROLENE 4-0 RB1 .5 CRCL 36 (SUTURE) ×1 IMPLANT
SUT VIC AB 0 CT1 27 (SUTURE) ×2
SUT VIC AB 0 CT1 27XBRD ANTBC (SUTURE) ×2 IMPLANT
SUT VIC AB 2-0 CT1 27 (SUTURE)
SUT VIC AB 2-0 CT1 TAPERPNT 27 (SUTURE) ×1 IMPLANT
TOWEL OR 17X26 10 PK STRL BLUE (TOWEL DISPOSABLE) ×2 IMPLANT
UNDERPAD 30X36 HEAVY ABSORB (UNDERPADS AND DIAPERS) ×2 IMPLANT
WATER STERILE IRR 1000ML POUR (IV SOLUTION) ×1 IMPLANT

## 2021-02-10 NOTE — Anesthesia Procedure Notes (Signed)
Anesthesia Regional Block: Axillary brachial plexus block   Pre-Anesthetic Checklist: , timeout performed,  Correct Patient, Correct Site, Correct Laterality,  Correct Procedure, Correct Position, site marked,  Risks and benefits discussed,  Surgical consent,  Pre-op evaluation,  At surgeon's request and post-op pain management  Laterality: Left and Upper  Prep: chloraprep       Needles:  Injection technique: Single-shot      Needle Length: 9cm  Needle Gauge: 22     Additional Needles: Arrow StimuQuik ECHO Echogenic Stimulating PNB Needle  Procedures:,,,, ultrasound used (permanent image in chart),,    Narrative:  Start time: 02/10/2021 3:14 PM End time: 02/10/2021 3:21 PM Injection made incrementally with aspirations every 5 mL.  Performed by: Personally  Anesthesiologist: Oleta Mouse, MD

## 2021-02-10 NOTE — Interval H&P Note (Signed)
History and Physical Interval Note:  02/10/2021 2:31 PM  Latasha Hodges  has presented today for surgery, with the diagnosis of Left wrist fracture.  The various methods of treatment have been discussed with the patient and family. After consideration of risks, benefits and other options for treatment, the patient has consented to  Procedure(s) with comments: OPEN REDUCTION INTERNAL FIXATION (ORIF) WRIST FRACTURE (Left) - with MAC as a surgical intervention.  The patient's history has been reviewed, patient examined, no change in status, stable for surgery.  I have reviewed the patient's chart and labs.  Questions were answered to the patient's satisfaction.     [name]

## 2021-02-10 NOTE — Transfer of Care (Signed)
Immediate Anesthesia Transfer of Care Note  Patient: Latasha Hodges  Procedure(s) Performed: OPEN REDUCTION INTERNAL FIXATION (ORIF) WRIST FRACTURE (Left: Wrist)  Patient Location: PACU  Anesthesia Type:MAC and Regional  Level of Consciousness: awake, alert  and oriented  Airway & Oxygen Therapy: Patient Spontanous Breathing and Patient connected to face mask  Post-op Assessment: Report given to RN and Post -op Vital signs reviewed and stable  Post vital signs: Reviewed and stable  Last Vitals:  Vitals Value Taken Time  BP 111/52 02/10/21 1820  Temp    Pulse 56 02/10/21 1829  Resp 13 02/10/21 1829  SpO2 100 % 02/10/21 1829  Vitals shown include unvalidated device data.  Last Pain:  Vitals:   02/10/21 1820  PainSc: Asleep      Patients Stated Pain Goal: 3 (77/93/96 8864)  Complications: No notable events documented.

## 2021-02-10 NOTE — Progress Notes (Signed)
Assisted Dr. Ermalene Postin with left, ultrasound guided, axillary block. Side rails up, monitors on throughout procedure. See vital signs in flow sheet. Tolerated Procedure well.

## 2021-02-10 NOTE — Discharge Instructions (Signed)
Orthopaedic Hand Surgery Discharge Instructions  WEIGHT BEARING STATUS: Non weight bearing on operative extremity  DRESSINGS: Please keep your dressing/splint/cast clean and dry until your follow-up appointment. You may shower by placing a waterproof covering over your dressing/splint/cast. Contact your surgeon if your splint/cast gets wet. It will need to be changed to prevent skin breakdown.  PAIN CONTROL: First line medications for post operative pain control are Tylenol (acetaminophen) and Motrin (ibuprofen) if you are able to take these medications. If you have been prescribed a medication these can be taken as breakthrough pain medications. Please note that some narcotic pain medication have acetaminophen added and you should never consume more than 4,000mg  of acetaminophen in 24 hour period. Also please note that if you are given Toradol (ketoralac) you should not take similar medications simultaneously such as ibuprofen.   ICE/ELEVATION: Ice and elevate your injured extremity as needed. Avoid direct contact of ice with skin.  HOME MEDICATIONS: No changes have been made to your home medications.  FOLLOW UP: You will be called after surgery with an appointment date and time, however if you have not received a phone call within 3 days please call during regular office hours at [phone] to schedule a post operative appointment.  Please Seek Medical Attention if: Call MD for: pain or pressure in chest, jaw, arm, back, neck  Call MD for: temperature greater than 101 F for more than 24 hours  Call MD for: difficulty breathing Call MD for: Incision redness, bleeding, drainage  Call MD for: palpitations or feeling that the heart is racing  Call MD for: increased swelling in arm, leg, ankle, or abdomen  Call MD for: lightheadedness, dizziness, fainting Go to ED or call 911 if: chest pain does not go away after 3 nitroglycerin doses taken 5 min apart  Go to ED or call 911 for: any  uncontrolled bleeding  Go to ED or call 911 if: unable to reach physician  Discharge Medications: Allergies as of 02/10/2021       Reactions   Iodinated Contrast Media Hives, Swelling, Rash   No [employer] Food Allergies Rash   Seafood   Penicillins Rash   Childhood reaction.   Sulfa Antibiotics Hives, Rash   Theophyllines Rash        Medication List     TAKE these medications    clotrimazole-betamethasone cream Commonly known as: LOTRISONE Apply 1 application topically 2 (two) times daily. May use with OTC zinc oxide cream.   clotrimazole-betamethasone cream Commonly known as: Lotrisone Apply 3 application topically 2 (two) times daily.   clotrimazole-betamethasone cream Commonly known as: Lotrisone Apply 1 application topically 2 (two) times daily.   diphenhydrAMINE-zinc acetate cream Commonly known as: BENADRYL Apply 1 application topically 3 (three) times daily as needed for itching.   fenofibrate micronized 200 MG capsule Commonly known as: LOFIBRA TAKE 1 CAPSULE BY MOUTH DAILY BEFORE AND BREAKFAST   flecainide 50 MG tablet Commonly known as: TAMBOCOR TAKE 1 TABLET(50 MG) BY MOUTH TWICE DAILY   HYDROcodone-acetaminophen 5-325 MG tablet Commonly known as: NORCO/VICODIN Take 1 tablet by mouth every 6 (six) hours as needed for up to 5 days for moderate pain. What changed: when to take this   hydrOXYzine 10 MG tablet Commonly known as: ATARAX Take 1 tablet (10 mg total) by mouth 3 (three) times daily as needed.   levothyroxine 25 MCG tablet Commonly known as: Synthroid Take 1 tablet (25 mcg total) by mouth daily before breakfast.   levothyroxine 75 MCG tablet  Commonly known as: SYNTHROID Take 75 mcg by mouth every morning.   lidocaine-prilocaine cream Commonly known as: EMLA Apply 1 application topically as needed.   lisinopril 20 MG tablet Commonly known as: ZESTRIL TAKE 1 TABLET(20 MG) BY MOUTH DAILY   magnesium gluconate 500 MG  tablet Commonly known as: MAGONATE Take 500 mg by mouth daily.   metFORMIN 1000 MG tablet Commonly known as: GLUCOPHAGE Take 1,000 mg by mouth 2 (two) times daily.   methocarbamol 500 MG tablet Commonly known as: Robaxin Take 1 tablet (500 mg total) by mouth 4 (four) times daily. What changed:  when to take this reasons to take this   methylPREDNISolone 4 MG Tbpk tablet Commonly known as: MEDROL DOSEPAK Take 6 tablets day 1, then Take 5 tablets day 2, Take 4 tablets day 3 Take 3 tablets day 4, Take 2 tablets day 5 Take 1 tablet  day 6 and then stop.   metoprolol succinate 50 MG 24 hr tablet Commonly known as: TOPROL-XL TAKE 1 TABLET(50 MG) BY MOUTH DAILY What changed: See the new instructions.   prochlorperazine 10 MG tablet Commonly known as: COMPAZINE Take 1 tablet (10 mg total) by mouth every 6 (six) hours as needed for nausea or vomiting.   simvastatin 40 MG tablet Commonly known as: ZOCOR TAKE 1 TABLET(40 MG) BY MOUTH DAILY AT 6 PM   triamcinolone ointment 0.5 % Commonly known as: KENALOG Apply 1 application topically 2 (two) times daily.   Trulicity 1.5 FM/3.8GY Sopn Generic drug: Dulaglutide Inject 1.5 mg into the skin every Thursday.   warfarin 5 MG tablet Commonly known as: COUMADIN Take as directed. If you are unsure how to take this medication, talk to your nurse or doctor. Original instructions: TAKE AS DIRECTED BY COUMADIN CLINIC What changed:  how much to take how to take this when to take this additional instructions          J. Sable Feil, MD Orthopaedic Hand Surgeon [facility] Office number: [phone] [address], [place], [place] [address]

## 2021-02-12 NOTE — Op Note (Signed)
OPERATIVE NOTE  DATE OF PROCEDURE: 02/10/2021  SURGEONS:  Primary: [name], MD  PREOPERATIVE DIAGNOSIS: Left wrist fracture, radiocarpal subluxation, DRUJ subluxation  POSTOPERATIVE DIAGNOSIS: Same  NAME OF PROCEDURE:   Left wrist open reduction internal fixation intra articular > 3 fragments Left extensor pollicis longus tendon transposition Left posterior interosseous nerve neurectomy Left wrist DRUJ dislocation open reduction internal fixation/stabilization  Left wrist dorsal spanning plate application for open reduction of radiocarpal dorsal dislocation  ANESTHESIA: Regional + MAC  SKIN PREPARATION: Hibiclens  ESTIMATED BLOOD LOSS: Minimal  IMPLANTS:  Implant Name Type Inv. Item Serial No. Manufacturer Lot No. LRB No. Used Action  PLATE DVR CROSSLOCK 159 WRIST - [id number] Plate PLATE DVR CROSSLOCK 462 WRIST  [employer](ORTH,TRAU,BIO,SG) [id number] Left 1 Implanted  SCREW  LP NL 2.7X10MM - [id number] Screw SCREW  LP NL 2.7X10MM  [employer](ORTH,TRAU,BIO,SG)  Left 1 Implanted  SCREW 2.7X12MM - [id number] Screw SCREW 2.7X12MM  [employer](ORTH,TRAU,BIO,SG)  Left 1 Implanted  SCREW 2.7X14MM - [id number] Screw SCREW 2.7X14MM  [employer](ORTH,TRAU,BIO,SG)  Left 2 Implanted  SCREW LOCKING 2.7X14 - [id number] Screw SCREW LOCKING 2.7X14  [employer](ORTH,TRAU,BIO,SG)  Left 1 Implanted  SCREW  LP NL 2.7X11MM - [id number] Screw SCREW  LP NL 2.7X11MM  [employer](ORTH,TRAU,BIO,SG)  Left 1 Implanted  SCREW LOCKING 2.7X11MM - [id number] Screw SCREW LOCKING 2.7X11MM  [employer](ORTH,TRAU,BIO,SG)  Left 2 Implanted  SCREW LOCKING 2.7X13MM - [id number] Screw SCREW LOCKING 2.7X13MM  [employer](ORTH,TRAU,BIO,SG)  Left 1 Implanted  PLATE F3 FRAG HI STR RT - [id number] Plate PLATE F3 FRAG HI STR RT  [employer](ORTH,TRAU,BIO,SG)  Left 1 Implanted  SCREW PEG 2.5X18 NONLOCK - [id number] Screw SCREW PEG 2.5X18 NONLOCK  [employer](ORTH,TRAU,BIO,SG)  Left 1 Implanted  SCREW PEG 2.5X16 NONLOCK  - [id number] Screw SCREW PEG 2.5X16 NONLOCK  [employer](ORTH,TRAU,BIO,SG)  Left 1 Implanted  SCREW PEG LOCK 2.5X14 - [id number] Peg SCREW PEG LOCK 2.5X14  [employer](ORTH,TRAU,BIO,SG)  Left 1 Implanted   INDICATIONS:  Latasha Hodges is a 75 y.o. female who has the above preoperative diagnosis. The patient has decided to proceed with surgical intervention.  Risks, benefits and alternatives of operative management were discussed including, but not limited to, risks of anesthesia complications, infection, pain, persistent symptoms, stiffness, need for future surgery.  The patient understands, agrees and elects to proceed with surgery.    DESCRIPTION OF PROCEDURE: The patient was met in the pre-operative area and their identity was verified.  The operative location and laterality was also verified and marked.  The patient was brought to the OR and was placed supine on the table.  After repeat patient identification with the operative team anesthesia was provided and the patient was prepped and draped in the usual sterile fashion.  A final timeout was performed verifying the correction patient, procedure, location and laterality.  Preoperative antibiotics were provided and then the left upper extremity was elevated and exsanguinated with an Esmarch and tourniquet inflated to 250 mmHg.  A standard dorsal approach was made to the wrist longitudinally.  Skin and subcutaneous tissues were divided and careful hemostasis was obtained with bipolar electrocautery.  The extensor pollicis longus was identified and an extensor pollicis longus tendon transposition was performed with a transfer to a radial and superficial position in relation to the extensor retinaculum.  At this time attention was turned to the fourth extensor compartment at the radial aspect of the floor the posterior interosseous nerve was identified and a neurectomy was performed by removing a 2 cm segment of the  posterior interosseous nerve.  At this time the  fourth and fourth extensor compartment was lifted off of the distal radius as well as the floor of the second extensor compartment was lifted subperiosteally.  Dissection was made dorsal and ulnar down to the dorsal ulnar corner fragment.  This was mobilized and there was significant instability of the distal radial ulnar joint.  The TFCC remained intact this fragment.  There was a separate dorsal with a large portion of the articular surface present.  This was mobilized and the joint was inspected.  There was significant cartilage loss from the injury with greater than 50% of the joint surface chondromalacia.  The articular surface fragments for greater than 3 fragments of the distal radius reduced and provisionally held in place manually.  At this time a dorsal buttress plate was in bicortically placed screws were placed through the shaft of the distal radius with excellent buttress of the dorsal fragment and restoration of the articular surface as best as possible.  At this time attention was turned towards correction of the dorsal subluxation of the radiocarpal joint via application of a dorsal spanning plate.  The Biomet dorsal spanning plate was selected and placed over the index metacarpal.  Skin and subcutaneous tissues were divided and the plate was passed in retrograde fashion from  distally to proximally onto the floor of the second extensor compartment.  The plate was secured to the bone provisionally with K wires after application of traction and reduction of the radiocarpal joint.  Bicortically placed screws in the metacarpal bones of the radial shaft were then placed to secure the dorsal spanning plate.  C-arm fluoroscopy 4 view radiographs were obtained to confirm adequate length alignment and rotation of the fracture reduction as well as open reduction of the radiocarpal joint.  At this time is noticed that there was still significant persistent instability of the distal radial ulnar joint.  The  dorsal ulnar corner fragment was too small to safely placed screws for good fixation thus the decision was made to proceed with placement of K wires from the ulna through the dorsal ulnar corner fragment from an ulnar to radial  direction with excellent reduction of this fracture fragment and restoration of the distal radial ulnar joint stability.  A second 0.062 in K wire was placed in the same fashion just proximal to this.   This time the wound was copiously irrigated and final fluoroscopy images were obtained to confirm adequate length alignment rotation and placement of the hardware.  The retinaculum was then closed dorsally as well as closure of the skin in routine fashion.  Horizontal mattress nylon sutures were used to close the skin.  Sterile soft bandage was applied followed by long-arm fiberglass splint with the wrist in neutral rotation.  The tourniquet was deflated and the fingers were pink and warm and well-perfused at the end the procedure.  The patient tolerated the procedure well and was awoken from anesthesia and brought to PACU for recovery in stable condition.   Matt Holmes, MD

## 2021-02-14 NOTE — Anesthesia Postprocedure Evaluation (Signed)
Anesthesia Post Note  Patient: Latasha Hodges  Procedure(s) Performed: OPEN REDUCTION INTERNAL FIXATION (ORIF) WRIST FRACTURE (Left: Wrist)     Patient location during evaluation: PACU Anesthesia Type: Regional and MAC Level of consciousness: awake and alert Pain management: pain level controlled Vital Signs Assessment: post-procedure vital signs reviewed and stable Respiratory status: spontaneous breathing, nonlabored ventilation, respiratory function stable and patient connected to nasal cannula oxygen Cardiovascular status: stable and blood pressure returned to baseline Postop Assessment: no apparent nausea or vomiting Anesthetic complications: no   No notable events documented.  Last Vitals:  Vitals:   02/10/21 1850 02/10/21 1900  BP: (!) 149/57 (!) 150/57  Pulse: (!) 59 (!) 56  Resp: 15 10  Temp:  (!) 36.1 C  SpO2: 100% 100%    Last Pain:  Vitals:   02/10/21 1900  PainSc: 0-No pain                 Blayton Huttner

## 2021-02-17 ENCOUNTER — Other Ambulatory Visit: Payer: Self-pay

## 2021-02-17 ENCOUNTER — Ambulatory Visit (INDEPENDENT_AMBULATORY_CARE_PROVIDER_SITE_OTHER): Payer: Medicare Other | Admitting: *Deleted

## 2021-02-17 DIAGNOSIS — I480 Paroxysmal atrial fibrillation: Secondary | ICD-10-CM

## 2021-02-17 DIAGNOSIS — I4819 Other persistent atrial fibrillation: Secondary | ICD-10-CM

## 2021-02-17 DIAGNOSIS — Z7189 Other specified counseling: Secondary | ICD-10-CM

## 2021-02-17 DIAGNOSIS — Z5181 Encounter for therapeutic drug level monitoring: Secondary | ICD-10-CM

## 2021-02-17 LAB — POCT INR: INR: 1.8 — AB (ref 2.0–3.0)

## 2021-02-17 NOTE — Patient Instructions (Signed)
Description   Take 1 tablet of warfarin today and then continue to take warfarin 1 tablet daily except for 1/2 a tablet on Tuesday and Thursday. Recheck INR in 1 week. ( Pt post procedure). Stay consistent with greens (3 servings per week). Call our office if you have any questions or if placed on any new medications [phone].

## 2021-02-18 DIAGNOSIS — S52502D Unspecified fracture of the lower end of left radius, subsequent encounter for closed fracture with routine healing: Secondary | ICD-10-CM | POA: Diagnosis not present

## 2021-02-21 ENCOUNTER — Encounter (HOSPITAL_COMMUNITY): Payer: Self-pay | Admitting: Orthopedic Surgery

## 2021-02-24 ENCOUNTER — Ambulatory Visit: Payer: Medicare Other

## 2021-02-24 ENCOUNTER — Other Ambulatory Visit: Payer: Self-pay

## 2021-02-24 DIAGNOSIS — I480 Paroxysmal atrial fibrillation: Secondary | ICD-10-CM | POA: Diagnosis not present

## 2021-02-24 DIAGNOSIS — Z5181 Encounter for therapeutic drug level monitoring: Secondary | ICD-10-CM

## 2021-02-24 DIAGNOSIS — Z7189 Other specified counseling: Secondary | ICD-10-CM | POA: Diagnosis not present

## 2021-02-24 DIAGNOSIS — I4819 Other persistent atrial fibrillation: Secondary | ICD-10-CM | POA: Diagnosis not present

## 2021-02-24 LAB — POCT INR: INR: 1.4 — AB (ref 2.0–3.0)

## 2021-02-24 NOTE — Patient Instructions (Signed)
Description   Take 1.5 tablets today and then START taking warfarin 1 tablet daily except for 1/2 a tablet on Tuesday and Thursday. Recheck INR in 1 week.  Stay consistent with greens (3 servings per week).  Call our office if you have any questions or if placed on any new medications [phone].

## 2021-02-25 DIAGNOSIS — M25632 Stiffness of left wrist, not elsewhere classified: Secondary | ICD-10-CM | POA: Diagnosis not present

## 2021-02-25 DIAGNOSIS — M25532 Pain in left wrist: Secondary | ICD-10-CM | POA: Diagnosis not present

## 2021-03-01 ENCOUNTER — Other Ambulatory Visit: Payer: Self-pay | Admitting: Cardiology

## 2021-03-01 ENCOUNTER — Telehealth: Payer: Self-pay | Admitting: *Deleted
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
## 2021-03-02 ENCOUNTER — Inpatient Hospital Stay: Payer: Medicare Other | Attending: Oncology

## 2021-03-02 ENCOUNTER — Ambulatory Visit (HOSPITAL_COMMUNITY)
Admission: RE | Admit: 2021-03-02 | Discharge: 2021-03-02 | Disposition: A | Discharge status: Home / Self Care | Payer: Medicare Other | Source: Ambulatory Visit | Attending: Oncology | Admitting: Oncology

## 2021-03-02 ENCOUNTER — Other Ambulatory Visit: Payer: Self-pay

## 2021-03-02 DIAGNOSIS — Z9221 Personal history of antineoplastic chemotherapy: Secondary | ICD-10-CM | POA: Insufficient documentation

## 2021-03-02 DIAGNOSIS — D49511 Neoplasm of unspecified behavior of right kidney: Secondary | ICD-10-CM | POA: Insufficient documentation

## 2021-03-02 DIAGNOSIS — C641 Malignant neoplasm of right kidney, except renal pelvis: Secondary | ICD-10-CM | POA: Insufficient documentation

## 2021-03-02 DIAGNOSIS — R918 Other nonspecific abnormal finding of lung field: Secondary | ICD-10-CM | POA: Diagnosis not present

## 2021-03-02 DIAGNOSIS — Z79899 Other long term (current) drug therapy: Secondary | ICD-10-CM | POA: Insufficient documentation

## 2021-03-02 DIAGNOSIS — Z95828 Presence of other vascular implants and grafts: Secondary | ICD-10-CM

## 2021-03-02 DIAGNOSIS — Z905 Acquired absence of kidney: Secondary | ICD-10-CM | POA: Insufficient documentation

## 2021-03-02 DIAGNOSIS — L309 Dermatitis, unspecified: Secondary | ICD-10-CM | POA: Diagnosis not present

## 2021-03-02 DIAGNOSIS — I700 Atherosclerosis of aorta: Secondary | ICD-10-CM | POA: Diagnosis not present

## 2021-03-02 DIAGNOSIS — E039 Hypothyroidism, unspecified: Secondary | ICD-10-CM | POA: Insufficient documentation

## 2021-03-02 DIAGNOSIS — C689 Malignant neoplasm of urinary organ, unspecified: Secondary | ICD-10-CM | POA: Diagnosis not present

## 2021-03-02 DIAGNOSIS — K5730 Diverticulosis of large intestine without perforation or abscess without bleeding: Secondary | ICD-10-CM | POA: Diagnosis not present

## 2021-03-02 LAB — CMP (CANCER CENTER ONLY)
ALT: 9 U/L (ref 0–44)
AST: 10 U/L — ABNORMAL LOW (ref 15–41)
Albumin: 3.9 g/dL (ref 3.5–5.0)
Alkaline Phosphatase: 45 U/L (ref 38–126)
Anion gap: 7 (ref 5–15)
BUN: 38 mg/dL — ABNORMAL HIGH (ref 8–23)
CO2: 25 mmol/L (ref 22–32)
Calcium: 9.5 mg/dL (ref 8.9–10.3)
Chloride: 105 mmol/L (ref 98–111)
Creatinine: 0.97 mg/dL (ref 0.44–1.00)
GFR, Estimated: >60 mL/min (ref >60)
Glucose, Bld: 167 mg/dL — ABNORMAL HIGH (ref 70–99)
Potassium: 4.7 mmol/L (ref 3.5–5.1)
Sodium: 137 mmol/L (ref 135–145)
Total Bilirubin: 0.3 mg/dL (ref 0.3–1.2)
Total Protein: 7 g/dL (ref 6.5–8.1)

## 2021-03-02 LAB — CBC WITH DIFFERENTIAL (CANCER CENTER ONLY)
Abs Immature Granulocytes: 0.04 10*3/uL (ref 0.00–0.07)
Basophils Absolute: 0.1 10*3/uL (ref 0.0–0.1)
Basophils Relative: 1 %
Eosinophils Absolute: 0.1 10*3/uL (ref 0.0–0.5)
Eosinophils Relative: 1 %
HCT: 30.7 % — ABNORMAL LOW (ref 36.0–46.0)
Hemoglobin: 10.2 g/dL — ABNORMAL LOW (ref 12.0–15.0)
Immature Granulocytes: 1 %
Lymphocytes Relative: 24 %
Lymphs Abs: 1.3 10*3/uL (ref 0.7–4.0)
MCH: 28.5 pg (ref 26.0–34.0)
MCHC: 33.2 g/dL (ref 30.0–36.0)
MCV: 85.8 fL (ref 80.0–100.0)
Monocytes Absolute: 0.5 10*3/uL (ref 0.1–1.0)
Monocytes Relative: 9 %
Neutro Abs: 3.5 10*3/uL (ref 1.7–7.7)
Neutrophils Relative %: 64 %
Platelet Count: 287 10*3/uL (ref 150–400)
RBC: 3.58 MIL/uL — ABNORMAL LOW (ref 3.87–5.11)
RDW: 13.6 % (ref 11.5–15.5)
WBC Count: 5.4 10*3/uL (ref 4.0–10.5)
nRBC: 0 % (ref 0.0–0.2)

## 2021-03-02 LAB — TSH: TSH: 7.225 u[IU]/mL — ABNORMAL HIGH (ref 0.308–3.960)

## 2021-03-02 MED ORDER — SODIUM CHLORIDE 0.9% FLUSH
10.0000 mL | INTRAVENOUS | Status: AC | PRN
  Administered 2021-03-02: 10 mL

## 2021-03-02 NOTE — Progress Notes (Signed)
Patient refused to leave port accessed for CT.

## 2021-03-03 ENCOUNTER — Ambulatory Visit: Payer: Medicare Other

## 2021-03-03 DIAGNOSIS — I4819 Other persistent atrial fibrillation: Secondary | ICD-10-CM | POA: Diagnosis not present

## 2021-03-03 DIAGNOSIS — I480 Paroxysmal atrial fibrillation: Secondary | ICD-10-CM | POA: Diagnosis not present

## 2021-03-03 DIAGNOSIS — Z7189 Other specified counseling: Secondary | ICD-10-CM | POA: Diagnosis not present

## 2021-03-03 DIAGNOSIS — Z5181 Encounter for therapeutic drug level monitoring: Secondary | ICD-10-CM

## 2021-03-03 LAB — POCT INR: INR: 3 (ref 2.0–3.0)

## 2021-03-03 NOTE — Patient Instructions (Signed)
Description   Continue taking warfarin 1 tablet daily except for 1/2 a tablet on Tuesday and Thursday. Recheck INR in 3 weeks. Stay consistent with greens (2 servings per week).  Call our office if you have any questions or if placed on any new medications [phone].

## 2021-03-04 DIAGNOSIS — S52502D Unspecified fracture of the lower end of left radius, subsequent encounter for closed fracture with routine healing: Secondary | ICD-10-CM | POA: Diagnosis not present

## 2021-03-06 ENCOUNTER — Other Ambulatory Visit: Payer: Self-pay | Admitting: Cardiology

## 2021-03-06 DIAGNOSIS — I10 Essential (primary) hypertension: Secondary | ICD-10-CM

## 2021-03-06 DIAGNOSIS — I4510 Unspecified right bundle-branch block: Secondary | ICD-10-CM

## 2021-03-06 DIAGNOSIS — I4819 Other persistent atrial fibrillation: Secondary | ICD-10-CM

## 2021-03-06 DIAGNOSIS — E7800 Pure hypercholesterolemia, unspecified: Secondary | ICD-10-CM

## 2021-03-07 ENCOUNTER — Telehealth: Payer: Self-pay | Admitting: Cardiology

## 2021-03-07 ENCOUNTER — Other Ambulatory Visit: Payer: Self-pay | Admitting: *Deleted

## 2021-03-07 NOTE — Telephone Encounter (Signed)
flecainide (TAMBOCOR) 50 MG tablet Medication Date: 03/07/2021 Department: [address] Office Ordering/Authorizing: Sueanne Margarita, MD   Order Providers  Prescribing Provider Encounter Provider  Sueanne Margarita, MD Sueanne Margarita, MD   Outpatient Medication Detail   Disp Refills Start End   flecainide (TAMBOCOR) 50 MG tablet 180 tablet 1 03/07/2021    Sig: TAKE 1 TABLET(50 MG) BY MOUTH TWICE DAILY Patient must keep 06/2021 appointment for further refills   Sent to pharmacy as: flecainide (TAMBOCOR) 50 MG tablet   E-Prescribing Status: Receipt confirmed by pharmacy (03/07/2021 12:39 PM EST)    Associated Diagnoses  Persistent atrial fibrillation ([place])     Essential hypertension     Pure hypercholesterolemia     RBBB      [id number] - [place], [place] - [address] AT [address]

## 2021-03-07 NOTE — Telephone Encounter (Signed)
°*  STAT* If patient is at the pharmacy, call can be transferred to refill team.   1. Which medications need to be refilled? (please list name of each medication and dose if known) flecainide (TAMBOCOR) 50 MG tablet  2. Which pharmacy/location (including street and city if local pharmacy) is medication to be sent to? [id number] - [place],  - [address] AT [address]  3. Do they need a 30 day or 90 day supply? 90 day  Patient has an appointment 06/28/2021.

## 2021-03-09 ENCOUNTER — Telehealth: Payer: Self-pay | Admitting: Cardiology

## 2021-03-09 ENCOUNTER — Other Ambulatory Visit: Payer: Self-pay

## 2021-03-09 ENCOUNTER — Inpatient Hospital Stay: Payer: Medicare Other | Admitting: Oncology

## 2021-03-09 VITALS — BP 155/57 | HR 69 | Temp 97.8°F | Resp 19 | Ht 60.0 in | Wt 210.4 lb

## 2021-03-09 DIAGNOSIS — Z9221 Personal history of antineoplastic chemotherapy: Secondary | ICD-10-CM | POA: Diagnosis not present

## 2021-03-09 DIAGNOSIS — C641 Malignant neoplasm of right kidney, except renal pelvis: Secondary | ICD-10-CM | POA: Diagnosis not present

## 2021-03-09 DIAGNOSIS — Z79899 Other long term (current) drug therapy: Secondary | ICD-10-CM | POA: Diagnosis not present

## 2021-03-09 DIAGNOSIS — D49511 Neoplasm of unspecified behavior of right kidney: Secondary | ICD-10-CM

## 2021-03-09 DIAGNOSIS — L309 Dermatitis, unspecified: Secondary | ICD-10-CM | POA: Diagnosis not present

## 2021-03-09 DIAGNOSIS — Z95828 Presence of other vascular implants and grafts: Secondary | ICD-10-CM

## 2021-03-09 DIAGNOSIS — Z905 Acquired absence of kidney: Secondary | ICD-10-CM | POA: Diagnosis not present

## 2021-03-09 DIAGNOSIS — E039 Hypothyroidism, unspecified: Secondary | ICD-10-CM | POA: Diagnosis not present

## 2021-03-09 MED ORDER — CLOTRIMAZOLE-BETAMETHASONE 1-0.05 % EX CREA: 1.0000 "application " | TOPICAL_CREAM | Freq: Two times a day (BID) | CUTANEOUS | 3 refills | Status: DC

## 2021-03-09 MED ORDER — FLUCONAZOLE 100 MG PO TABS: 100.0000 mg | ORAL_TABLET | Freq: Every day | ORAL | 0 refills | Status: DC

## 2021-03-09 NOTE — Progress Notes (Signed)
Hematology and Oncology Follow Up Visit  Latasha Hodges [id number] May 03, 1945 76 y.o. 03/09/2021 8:51 AM Kelton Pillar, MDGriffin, [name], MD   Principle Diagnosis: 76 year old woman with T3aN1 clear-cell renal cell carcinoma diagnosed in December 2021.     Prior Therapy:   She is status post right robotic assisted laparoscopic radical nephrectomy completed by Dr. Alinda Money on January 29, 2020.  The final pathology showed a 7.0 cm clear cell renal cell carcinoma with tumor extends into the renal vein, renal capsule and renal sinus fat indicating T3a disease with 1 lymph node involvement.  No sarcomatoid or rhabdoid features noted with histological grade of G3.  Pembrolizumab 200 mg every 3 weeks started on March 31, 2020.  She received 400 mg starting with cycle 5 of therapy in May 2022.  Last cycle given on August 11, 2020.  Current therapy: Active surveillance.  Interim History: Latasha Hodges is here for return evaluation.  Since her last visit, she reports no major changes in her health.  She continues to have issues with fungal rash on her skin fold and vaginal area despite applying Lotrisone cream.  She denies any fevers or chills or sweats.  She denies any excessive fatigue or tiredness.  She denies any hospitalization or illnesses.  Her pruritus has improved at this time.    Medications: Reviewed without changes. Current Outpatient Medications  Medication Sig Dispense Refill   clotrimazole-betamethasone (LOTRISONE) cream Apply 1 application topically 2 (two) times daily. May use with OTC zinc oxide cream. 135 g 0   clotrimazole-betamethasone (LOTRISONE) cream Apply 3 application topically 2 (two) times daily. 30 g 3   clotrimazole-betamethasone (LOTRISONE) cream Apply 1 application topically 2 (two) times daily. 30 g 0   diphenhydrAMINE-zinc acetate (BENADRYL) cream Apply 1 application topically 3 (three) times daily as needed for itching.     Dulaglutide (TRULICITY) 1.5  PI/9.5JO SOPN Inject 1.5 mg into the skin every Thursday.     fenofibrate micronized (LOFIBRA) 200 MG capsule TAKE 1 CAPSULE BY MOUTH DAILY BEFORE BREAKFAST 90 capsule 2   flecainide (TAMBOCOR) 50 MG tablet TAKE 1 TABLET(50 MG) BY MOUTH TWICE DAILY Patient must keep 06/2021 appointment for further refills 180 tablet 1   hydrOXYzine (ATARAX/VISTARIL) 10 MG tablet Take 1 tablet (10 mg total) by mouth 3 (three) times daily as needed. (Patient not taking: Reported on 02/02/2021) 30 tablet 0   levothyroxine (SYNTHROID) 25 MCG tablet Take 1 tablet (25 mcg total) by mouth daily before breakfast. (Patient not taking: Reported on 02/02/2021) 90 tablet 3   levothyroxine (SYNTHROID) 75 MCG tablet Take 75 mcg by mouth every morning.     lidocaine-prilocaine (EMLA) cream Apply 1 application topically as needed. 30 g 0   lisinopril (ZESTRIL) 20 MG tablet TAKE 1 TABLET(20 MG) BY MOUTH DAILY 90 tablet 3   magnesium gluconate (MAGONATE) 500 MG tablet Take 500 mg by mouth daily.     metFORMIN (GLUCOPHAGE) 1000 MG tablet Take 1,000 mg by mouth 2 (two) times daily.  0   methocarbamol (ROBAXIN) 500 MG tablet Take 1 tablet (500 mg total) by mouth 4 (four) times daily. (Patient taking differently: Take 500 mg by mouth every 6 (six) hours as needed for muscle spasms.) 45 tablet 0   methylPREDNISolone (MEDROL DOSEPAK) 4 MG TBPK tablet Take 6 tablets day 1, then Take 5 tablets day 2, Take 4 tablets day 3 Take 3 tablets day 4, Take 2 tablets day 5 Take 1 tablet  day 6 and then stop. (Patient not taking:  Reported on 09/03/2020) 21 tablet 0   metoprolol succinate (TOPROL-XL) 50 MG 24 hr tablet TAKE 1 TABLET(50 MG) BY MOUTH DAILY (Patient taking differently: Take 50 mg by mouth daily.) 90 tablet 3   prochlorperazine (COMPAZINE) 10 MG tablet Take 1 tablet (10 mg total) by mouth every 6 (six) hours as needed for nausea or vomiting. 30 tablet 0   simvastatin (ZOCOR) 40 MG tablet TAKE 1 TABLET(40 MG) BY MOUTH DAILY AT 6 PM 90 tablet 3    triamcinolone ointment (KENALOG) 0.5 % Apply 1 application topically 2 (two) times daily. (Patient not taking: Reported on 02/02/2021) 45 g 3   warfarin (COUMADIN) 5 MG tablet TAKE AS DIRECTED BY COUMADIN CLINIC (Patient taking differently: Take 2.5-5 mg by mouth See admin instructions. Take 2.5 mg by mouth every other day, alternating with 5 mg on alternate days) 90 tablet 1   No current facility-administered medications for this visit.     Allergies:  Allergies  Allergen Reactions   Iodinated Contrast Media Hives, Swelling and Rash   No [employer] Food Allergies Rash    Seafood   Penicillins Rash    Childhood reaction.   Sulfa Antibiotics Hives and Rash   Theophyllines Rash      Physical Exam:     Blood pressure (!) 155/57, pulse 69, temperature 97.8 F (36.6 C), temperature source Temporal, resp. rate 19, height 5' (1.524 m), weight 210 lb 6.4 oz (95.4 kg), SpO2 100 %.     ECOG: 1  General appearance: Comfortable appearing without any discomfort Head: Normocephalic without any trauma Oropharynx: Mucous membranes are moist and pink without any thrush or ulcers. Eyes: Pupils are equal and round reactive to light. Lymph nodes: No cervical, supraclavicular, inguinal or axillary lymphadenopathy.   Heart:regular rate and rhythm.  S1 and S2 without leg edema. Lung: Clear without any rhonchi or wheezes.  No dullness to percussion. Abdomin: Soft, nontender, nondistended with good bowel sounds.  No hepatosplenomegaly. Musculoskeletal: No joint deformity or effusion.  Full range of motion noted. Neurological: No deficits noted on motor, sensory and deep tendon reflex exam. Skin: No petechial rash or dryness.  Appeared moist.             Lab Results: Lab Results  Component Value Date   WBC 5.4 03/02/2021   HGB 10.2 (L) 03/02/2021   HCT 30.7 (L) 03/02/2021   MCV 85.8 03/02/2021   PLT 287 03/02/2021     Chemistry      Component Value Date/Time   NA 137  03/02/2021 1324   NA 139 08/20/2017 1147   K 4.7 03/02/2021 1324   CL 105 03/02/2021 1324   CO2 25 03/02/2021 1324   BUN 38 (H) 03/02/2021 1324   BUN 18 08/20/2017 1147   CREATININE 0.97 03/02/2021 1324      Component Value Date/Time   CALCIUM 9.5 03/02/2021 1324   ALKPHOS 45 03/02/2021 1324   AST 10 (L) 03/02/2021 1324   ALT 9 03/02/2021 1324   BILITOT 0.3 03/02/2021 1324       IMPRESSION: 1. No new suspicious mass or lymphadenopathy identified in the chest, abdomen or pelvis. 2. Several chronic stable small pulmonary nodules measuring up to 4 mm in the right lower lobe. 3. Other ancillary findings as described.     Impression and Plan:   76 year old woman with:   1. T3a N1 clear cell renal cell carcinoma diagnosed in December 2021.  She is currently on active surveillance after poor tolerance to immunotherapy.  CT scan obtained on March 02, 2021 personally reviewed and showed no evidence of metastatic disease.  At this time I recommended continued active surveillance and reinstitute systemic therapy if she has relapsed disease.    2.  IV access: Port-A-Cath will continue to be flushed periodically.  Port-A-Cath removal were discussed at this time.     3.  Immune mediated complications: She continues to deal with dermatitis at this time but no additional complications.   4.  Abnormal gum tissue: Resolved at this time after the effective immunotherapy has resolved.  5.  Hypothyroidism: She continues to be on thyroid replacement with TSH improving.  6.  Fungal infection and dermatitis: Prescription for Diflucan will be sent to her pharmacy.  7.  Follow-up: In 2 months for repeat evaluation.   30  minutes were spent on this visit.  The time was dedicated to reviewing laboratory data, disease status update and outlining future plan of care review.   Zola Button, MD 1/25/20238:51 AM

## 2021-03-09 NOTE — Telephone Encounter (Signed)
Pt c/o medication issue:  1. Name of Medication:  fluconazol 100 MG  2. How are you currently taking this medication (dosage and times per day)? Not currently taking   3. Are you having a reaction (difficulty breathing--STAT)? No   4. What is your medication issue? Patient is calling stating she has been prescribed to take this medication 1 tablet daily for 7 days. She just picked it up from the pharmacy today, and the pharmacist advised her to speak with our coumadin clinic prior to starting the medication due to her being on warfarin. Please advise.

## 2021-03-09 NOTE — Telephone Encounter (Signed)
Fluconazole 100mg  QD x 7 days will interact with Warfarin causing the INR to go up.  Pt's current dosage of Warfarin is 5mg  QD except 2.5mg  on Tuesdays and Thursdays, last INR 03/03/21 3.0.  Spoke with pt, advised pt to go ahead and start taking the Fluconazole, made her aware of potential medication interaction with Warfarin.  Advised pt to take 2.5mg  today since INR was 3.0 on 03/03/21, then take 2.5mg  on Tues, Thurs, and Sat, 5mg  all other days while on Fluconazole.  Made appt for INR check on Monday 03/14/21 at 1:15pm.  Pt aware to watch for increased bleeding and bruising, and call with problems.

## 2021-03-14 ENCOUNTER — Other Ambulatory Visit: Payer: Self-pay

## 2021-03-14 ENCOUNTER — Ambulatory Visit: Payer: Medicare Other

## 2021-03-14 DIAGNOSIS — I480 Paroxysmal atrial fibrillation: Secondary | ICD-10-CM | POA: Diagnosis not present

## 2021-03-14 DIAGNOSIS — Z7189 Other specified counseling: Secondary | ICD-10-CM | POA: Diagnosis not present

## 2021-03-14 DIAGNOSIS — Z5181 Encounter for therapeutic drug level monitoring: Secondary | ICD-10-CM | POA: Diagnosis not present

## 2021-03-14 DIAGNOSIS — I4819 Other persistent atrial fibrillation: Secondary | ICD-10-CM

## 2021-03-14 LAB — POCT INR: INR: 2.4 (ref 2.0–3.0)

## 2021-03-14 NOTE — Patient Instructions (Signed)
Description   Only take 0.5 tablet today since you are still taking Fluconazole.   Then continue taking warfarin 1 tablet daily except for 1/2 a tablet on Tuesday and Thursday. Recheck INR in 3 weeks. Stay consistent with greens (2 servings per week).  Call our office if you have any questions or if placed on any new medications [phone].

## 2021-03-18 DIAGNOSIS — M25632 Stiffness of left wrist, not elsewhere classified: Secondary | ICD-10-CM | POA: Diagnosis not present

## 2021-04-04 ENCOUNTER — Telehealth: Payer: Self-pay | Admitting: *Deleted

## 2021-04-04 ENCOUNTER — Telehealth: Payer: Self-pay

## 2021-04-04 ENCOUNTER — Other Ambulatory Visit: Payer: Self-pay

## 2021-04-04 ENCOUNTER — Ambulatory Visit: Payer: Medicare Other

## 2021-04-04 DIAGNOSIS — I480 Paroxysmal atrial fibrillation: Secondary | ICD-10-CM

## 2021-04-04 DIAGNOSIS — I4819 Other persistent atrial fibrillation: Secondary | ICD-10-CM | POA: Diagnosis not present

## 2021-04-04 DIAGNOSIS — Z5181 Encounter for therapeutic drug level monitoring: Secondary | ICD-10-CM | POA: Diagnosis not present

## 2021-04-04 DIAGNOSIS — Z7189 Other specified counseling: Secondary | ICD-10-CM

## 2021-04-04 LAB — POCT INR: INR: 3 (ref 2.0–3.0)

## 2021-04-04 NOTE — Telephone Encounter (Signed)
° °  Pre-operative Risk Assessment    Patient Name: Latasha Hodges  DOB: [birth year] MRN: [id number]      Request for Surgical Clearance    Procedure:   LEFT WRIST REMOVAL OF HARDWARE  Date of Surgery:  Clearance 05/02/21                                 Surgeon:  DR. [name] Surgeon's Group or Practice Name:  [facility] Phone number:  [phone] Fax number:  [phone] ATTN: ASHLEY HILTON   Type of Clearance Requested:   - Medical  - Pharmacy:  Hold Warfarin (Coumadin)     Type of Anesthesia:   REGIONAL BLOCK   Additional requests/questions:    Jiles Prows   04/04/2021, 5:01 PM

## 2021-04-04 NOTE — Telephone Encounter (Signed)
Will await clearance information.

## 2021-04-04 NOTE — Telephone Encounter (Signed)
Pt was seen in Coumadin Clinic today for follow-up.  Pt mentioned she is scheduled for surgery on 05/02/21 to remove pins and hardware placed in previous procedure on 02/10/21.  Pt states Dr Greta Doom office is supposed to be sending over a clearance request to our office.  I do not see a clearance in the chart yet, so I am going to forward this message because previously clearance was not obtained in a timely manner (pt called 2 days before surgery, but needed to hold Warfarin for 5 days prior) to last surgery and it had to be postponed. Just wanted to put this procedure on our radar in case we do not receive a clearance request.  Thanks

## 2021-04-04 NOTE — Patient Instructions (Signed)
Description   Continue on same dosage of Warfarin 1 tablet daily except for 1/2 a tablet on Tuesdays and Thursdays. Recheck INR in 4 weeks. Stay consistent with greens (2 servings per week).  Call our office if you have any questions or if placed on any new medications [phone].

## 2021-04-05 NOTE — Telephone Encounter (Signed)
° °  Name: Esmee Fallaw  DOB: November 02, 1945  MRN: [id number]  Primary Cardiologist: Fransico Him, MD  Chart reviewed as part of pre-operative protocol coverage. Because of Adaleah Grayer's past medical history and time since last visit, she will require a follow-up visit in order to better assess preoperative cardiovascular risk.  Last OV was 01/2020, so >1 year ago.  Pre-op covering staff: - Please schedule appointment and call patient to inform them. If patient already had an upcoming appointment within acceptable timeframe, please add "pre-op clearance" to the appointment notes so provider is aware. - Please contact requesting surgeon's office via preferred method (i.e, phone, fax) to inform them of need for appointment prior to surgery.  If applicable, this message will also be routed to pharmacy pool for input on holding anticoagulant/antiplatelet agent as requested below so that this information is available to the clearing provider at time of patient's appointment. Finalized clearance will not be addressed until office visit.  Charlie Pitter, PA-C  04/05/2021, 1:03 PM

## 2021-04-05 NOTE — Telephone Encounter (Signed)
Patient with diagnosis of A Fib on warfarin for anticoagulation.    Procedure: LEFT WRIST REMOVAL OF HARDWARE Date of procedure: 05/02/21   CHA2DS2-VASc Score = 5  This indicates a 7.2% annual risk of stroke. The patient's score is based upon: CHF History: 0 HTN History: 1 Diabetes History: 1 Stroke History: 0 Vascular Disease History: 1 Age Score: 1 Gender Score: 1   CrCl 53 mL/min using adjusted body weight Platelet count 287K  Per office protocol, patient can hold warfarin for 5 days prior to procedure.

## 2021-04-05 NOTE — Telephone Encounter (Signed)
Anticoag recs are now available for review at upcoming OV with PA Richardson Dopp. Finalized clearance decision will be provided at that visit.

## 2021-04-05 NOTE — Telephone Encounter (Signed)
Pt has been scheduled to see Richardson Dopp, PA-C, 04/27/21, clearance will be addressed at that time.  Will route back to the requesting surgeon's office to make them aware.

## 2021-04-06 ENCOUNTER — Telehealth: Payer: Self-pay | Admitting: *Deleted

## 2021-04-06 NOTE — Telephone Encounter (Signed)
Operator called stated the pt needed to change her appt. After many attempts with the operators, myself and the pt offering her multiple appt changes, I had the operator send the pt call to me. I s/w the pt myself and offered her 04/18/21 @ 1:55 and the pt states she works everyday from 7:30-12:15 and cannot ask for time off. I have given the pt the address, phone number for the [place] location. I assured the pt that I will update the surgeon's office she has appt 04/18/21 @ 1:55 with Owens Shark, NP. I will send notes to NP for appt as well.

## 2021-04-06 NOTE — Telephone Encounter (Signed)
Operator called stated the pt needed to change her appt. After many attempts with the operators, myself and the pt offering her multiple appt changes, I had the operator send the pt call to me. I s/w the pt myself and offered her 04/18/21 @ 1:55 and the pt states she works everyday from 7:30-12:15 and cannot ask for time off. I have given the pt the address, phone number for the [place] location. I assured the pt that I will update the surgeon's office she has appt 04/18/21 @ 1:55 with Owens Shark, NP. I will send notes to NP for appt as well.

## 2021-04-18 ENCOUNTER — Ambulatory Visit (INDEPENDENT_AMBULATORY_CARE_PROVIDER_SITE_OTHER): Payer: No Typology Code available for payment source | Admitting: Family

## 2021-04-18 ENCOUNTER — Other Ambulatory Visit: Payer: Self-pay

## 2021-04-18 ENCOUNTER — Encounter (HOSPITAL_BASED_OUTPATIENT_CLINIC_OR_DEPARTMENT_OTHER): Payer: Self-pay | Admitting: Family

## 2021-04-18 VITALS — BP 110/72 | HR 55 | Ht 64.0 in | Wt 212.8 lb

## 2021-04-18 DIAGNOSIS — I480 Paroxysmal atrial fibrillation: Secondary | ICD-10-CM

## 2021-04-18 DIAGNOSIS — I10 Essential (primary) hypertension: Secondary | ICD-10-CM

## 2021-04-18 DIAGNOSIS — I4510 Unspecified right bundle-branch block: Secondary | ICD-10-CM

## 2021-04-18 DIAGNOSIS — D6859 Other primary thrombophilia: Secondary | ICD-10-CM

## 2021-04-18 DIAGNOSIS — E782 Mixed hyperlipidemia: Secondary | ICD-10-CM

## 2021-04-18 DIAGNOSIS — Z01818 Encounter for other preprocedural examination: Secondary | ICD-10-CM

## 2021-04-18 NOTE — Patient Instructions (Signed)
Medication Instructions:  ?Continue your current medications.  ? ?Your wrist surgery is scheduled for 05/02/21. You may hold your Warfarin (Coumadin) for 5 days prior to your surgery. Please stop on 04/27/21.  ? ?*If you need a refill on your cardiac medications before your next appointment, please call your pharmacy* ? ? ?Lab Work: ?None ordered today.  ? ?Testing/Procedures: ?Your EKG today shows sinus bradycardia with right bundle branch block which is a stable finding.  ? ? ?Follow-Up: ?At [facility], you and your health needs are our priority.  As part of our continuing mission to provide you with exceptional heart care, we have created designated Provider Care Teams.  These Care Teams include your primary Cardiologist (physician) and Advanced Practice Providers (APPs -  Physician Assistants and Nurse Practitioners) who all work together to provide you with the care you need, when you need it. ? ?We recommend signing up for the patient portal called "MyChart".  Sign up information is provided on this After Visit Summary.  MyChart is used to connect with patients for Virtual Visits (Telemedicine).  Patients are able to view lab/test results, encounter notes, upcoming appointments, etc.  Non-urgent messages can be sent to your provider as well.   ?To learn more about what you can do with MyChart, go to [email or web address].   ? ?Your next appointment:   ?1 year(s) ? ?The format for your next appointment:   ?In Person ? ?Provider:   ?Fransico Him, MD  ? ? ?Other Instructions ? ?Loel Dubonnet, NP will send a note to Dr. Greta Doom that you are cleared for your wrist surgery.  ? ?Heart Healthy Diet Recommendations: ?A low-salt diet is recommended. Meats should be grilled, baked, or boiled. Avoid fried foods. Focus on lean protein sources like fish or chicken with vegetables and fruits. The American Heart Association is a Microbiologist!  American Heart Association Diet and Lifeystyle Recommendations     ? ?Exercise recommendations: ?The American Heart Association recommends 150 minutes of moderate intensity exercise weekly. ?Try 30 minutes of moderate intensity exercise 4-5 times per week. ?This could include walking, jogging, or swimming. ?  ?

## 2021-04-18 NOTE — Progress Notes (Signed)
Office Visit    Patient Name: Latasha Hodges Date of Encounter: 04/18/2021  PCP:  Kelton Pillar, [facility]  Cardiologist:  Fransico Him, MD  Advanced Practice Provider:  No care team member to display Electrophysiologist:  None      Chief Complaint    Latasha Hodges is a 76 y.o. female with a hx of persistent atrial fibrillation on chronic anticoagulation, RBBB, Dm2, HLD, HTN  presents today for preop clearance   Past Medical History    Past Medical History:  Diagnosis Date   Acquired solitary kidney    left side;   s/p right nephrectomy 12/ 2021   Anticoagulated on Coumadin    long-term;  managed by cardiology   Arthritis    Chronic kidney disease (CKD), stage III (moderate) ([place])    Hyperlipidemia, mixed    Hypertension    Left wrist fracture    Malignant neoplasm of kidney, right ([place]) 01/2020   oncologist--- dr Alen Blew;  dx 12/ 2021  Stage IIIa,  Clear cell type;  01-29-2020  s/p robotic right radical nephrectomy (one posisitve node);  chemo 03-31-2020 to  08-11-2020   Persistent atrial fibrillation [facility])    cardiologist--- dr t. turner;  nuclear sress test-- 08-15-2017 low risk normal perfusion, nuclear ef 61%;  event monitor 12-18-2017 in epic   RBBB (right bundle branch block)    chronic   Type 2 diabetes mellitus ([place])    Past Surgical History:  Procedure Laterality Date   COLONOSCOPY  12/29/2010   Procedure: COLONOSCOPY;  Surgeon: Owens Loffler, MD;  [facility];  Service: Endoscopy;  Laterality: N/A;   IR IMAGING GUIDED PORT INSERTION  07/08/2020   LUMBAR LAMINECTOMY/DECOMPRESSION MICRODISCECTOMY Left 07/14/2020   Procedure: Laminectomy and Foraminotomy - Lumbar three-Lumbar four- Lumbar four-Lumbar five - left with sublaminar decompression;  Surgeon: Eustace Moore, MD;  Location: [place];  Service: Neurosurgery;  Laterality: Left;   ORIF WRIST FRACTURE Left 02/10/2021   Procedure: OPEN REDUCTION INTERNAL  FIXATION (ORIF) WRIST FRACTURE;  Surgeon: [name], MD;  [facility];  Service: Orthopedics;  Laterality: Left;  with MAC   ROBOT ASSISTED LAPAROSCOPIC NEPHRECTOMY N/A 01/29/2020   Procedure: XI ROBOTIC ASSISTED LAPAROSCOPIC RADICAL NEPHRECTOMY;  Surgeon: Raynelle Bring, MD;  [facility];  Service: Urology;  Laterality: N/A;   TUBAL LIGATION  1980    Allergies  Allergies  Allergen Reactions   Iodinated Contrast Media Hives, Swelling and Rash   No Healthtouch Food Allergies Rash    Seafood   Penicillins Rash    Childhood reaction.   Sulfa Antibiotics Hives and Rash   Theophyllines Rash    History of Present Illness    Latasha Hodges is a 76 y.o. female with a hx of persistent atrial fibrillation on chronic anticoagulation, RBBB, Dm2, HLD, HTN last seen 01/22/20.  Presents today for clearance for removal of hardware from the left wrist by Dr. Greta Doom of [facility]. Per pharmacist review and office protocols may hold Warfarin for 5 days prior to procedure. Shares with me that is planning a cruise to [place] April 23rd with some girlfriends. Reports no shortness of breath and stable mild dyspnea on exertion. Reports no chest pain, pressure, or tightness. No edema, orthopnea, PND. Reports no palpitations.  She has been exercising less due to back pain but able to walk one block without pain or stopping.   EKGs/Labs/Other Studies Reviewed:   The following studies were reviewed today:   CT  Chest 03/02/21 FINDINGS: CT CHEST FINDINGS   Cardiovascular: Heart size is normal. No pericardial effusion identified. Mild coronary artery calcifications. Main pulmonary artery is normal caliber. Thoracic aorta is normal in course and caliber with calcified atherosclerotic plaques. Right-sided central venous port.   Mediastinum/Nodes: No bulky axillary, mediastinal or hilar lymphadenopathy identified.   Lungs/Pleura: Several tiny pulmonary nodules are again seen and unchanged,  upper lobe predominant. The largest nodule is in the right lower lobe measuring 4 mm image 93. No definitely new or significantly enlarged pulmonary nodule identified. No focal consolidation identified. Linear band of scarring in the left lower lobe noted. No pleural effusion or pneumothorax.   Musculoskeletal: Degenerative changes in the thoracic spine. No suspicious bony lesions identified in the chest.   CT ABDOMEN PELVIS FINDINGS   Hepatobiliary: No focal liver abnormality is seen. No gallstones, gallbladder wall thickening, or biliary dilatation.   Pancreas: Unremarkable. No pancreatic ductal dilatation or surrounding inflammatory changes.   Spleen: Normal in size without focal abnormality.   Adrenals/Urinary Tract: Adrenal glands appear normal. Right nephrectomy surgical changes with no suspicious mass visualized in the renal fossa region. Left kidney appears normal. No nephrolithiasis or hydronephrosis.   Stomach/Bowel: No bowel obstruction, free air or pneumatosis. Mild colonic diverticulosis. No bowel wall edema visualized. No evidence of acute appendicitis.   Vascular/Lymphatic: Aortic atherosclerosis. No enlarged abdominal or pelvic lymph nodes.   Reproductive: 4.5 x 3.2 cm calcified uterine fibroid. 2.1 cm chronic right adnexal/ovarian cystic lesion with peripheral calcification.   Other: No ascites. Stable homogeneous fat density lipoma in the central right mesentery. Stable intramuscular lipoma in the right gluteus minimus muscle.   Musculoskeletal: Degenerative changes of the hips and lumbar spine. No suspicious bony lesions identified.   IMPRESSION: 1. No new suspicious mass or lymphadenopathy identified in the chest, abdomen or pelvis. 2. Several chronic stable small pulmonary nodules measuring up to 4 mm in the right lower lobe. 3. Other ancillary findings as described.  Stress test 08/2017 The left ventricular ejection fraction is normal  (55-65%). Nuclear stress EF: 61%. There was no ST segment deviation noted during stress. The study is normal. This is a low risk study.   Normal resting and stress perfusion. No ischemia or infarction EF 61%   Monitor 12/2017 Sinus bradycardia, normal sinus rhythm and sinus tachycardia. The average heart rate was 73bpm with heart rate range form 50 to 118bpm.    EKG:  EKG is ordered today.  The ekg ordered today demonstrates SB 55bpm with first degree block and stable RBBB.   Recent Labs: 03/02/2021: ALT 9; BUN 38; Creatinine 0.97; Hemoglobin 10.2; Platelet Count 287; Potassium 4.7; Sodium 137; TSH 7.225  Recent Lipid Panel    Component Value Date/Time   CHOL 142 03/06/2019 0807   TRIG 333 (H) 03/06/2019 0807   HDL 35 (L) 03/06/2019 0807   CHOLHDL 4.1 03/06/2019 0807   CHOLHDL 5 08/12/2014 0805   LDLCALC 56 03/06/2019 0807   LDLDIRECT 69.0 08/12/2014 0805    Risk Assessment/Calculations:   CHA2DS2-VASc Score = 5   This indicates a 7.2% annual risk of stroke. The patient's score is based upon: CHF History: 0 HTN History: 1 Diabetes History: 1 Stroke History: 0 Vascular Disease History: 1 Age Score: 1 Gender Score: 1    Home Medications   Current Meds  Medication Sig   clotrimazole-betamethasone (LOTRISONE) cream Apply 1 application topically 2 (two) times daily. May use with OTC zinc oxide cream.   Dulaglutide (TRULICITY) 1.5 EU/2.3NT  SOPN Inject 1.5 mg into the skin every Thursday.   fenofibrate micronized (LOFIBRA) 200 MG capsule TAKE 1 CAPSULE BY MOUTH DAILY BEFORE BREAKFAST   flecainide (TAMBOCOR) 50 MG tablet TAKE 1 TABLET(50 MG) BY MOUTH TWICE DAILY Patient must keep 06/2021 appointment for further refills   fluconazole (DIFLUCAN) 100 MG tablet Take 1 tablet (100 mg total) by mouth daily.   levothyroxine (SYNTHROID) 75 MCG tablet Take 75 mcg by mouth every morning.   lisinopril (ZESTRIL) 20 MG tablet TAKE 1 TABLET(20 MG) BY MOUTH DAILY   magnesium gluconate  (MAGONATE) 500 MG tablet Take 500 mg by mouth daily.   metFORMIN (GLUCOPHAGE) 1000 MG tablet Take 1,000 mg by mouth 2 (two) times daily.   metoprolol succinate (TOPROL-XL) 50 MG 24 hr tablet TAKE 1 TABLET(50 MG) BY MOUTH DAILY   simvastatin (ZOCOR) 40 MG tablet TAKE 1 TABLET(40 MG) BY MOUTH DAILY AT 6 PM   triamcinolone ointment (KENALOG) 0.5 % Apply 1 application topically 2 (two) times daily.   warfarin (COUMADIN) 5 MG tablet TAKE AS DIRECTED BY COUMADIN CLINIC     Review of Systems      All other systems reviewed and are otherwise negative except as noted above.  Physical Exam    VS:  BP 110/72 (BP Location: Right Arm, Patient Position: Sitting)    Pulse (!) 55    Ht _0  (1.626 m)    Wt 212 lb 12.8 oz (96.5 kg)    SpO2 98%    BMI 36.53 kg/m  , BMI Body mass index is 36.53 kg/m.  Wt Readings from Last 3 Encounters:  04/18/21 212 lb 12.8 oz (96.5 kg)  03/09/21 210 lb 6.4 oz (95.4 kg)  02/10/21 199 lb 15.3 oz (90.7 kg)     GEN: Well nourished, overweight, well developed, in no acute distress. HEENT: normal. Neck: Supple, no JVD, carotid bruits, or masses. Cardiac: RRR, bradycardic, no murmurs, rubs, or gallops. No clubbing, cyanosis, edema.  Radials/PT 2+ and equal bilaterally.  Respiratory:  Respirations regular and unlabored, clear to auscultation bilaterally. GI: Soft, nontender, nondistended. MS: No deformity or atrophy. Skin: Warm and dry, no rash. Neuro:  Strength and sensation are intact. Psych: Normal affect.  Assessment & Plan    Preop clearance - Upcoming wrist surgery. According to the Revised Cardiac Risk Index (RCRI), her Perioperative Risk of Major Cardiac Event is (%): 0.9. Her  Functional Capacity in METs is: 5.07 according to the Duke Activity Status Index (DASI). Per pharmacist and office protocols may hold Warfarin 5 days prior to planned procedure. She is deemed acceptable risk for planned procedure without additional cardiovascular testing. Will route to  Dr. Greta Doom and surgical team so they are aware.   Kidney cancer - s/p nephrectomy. Follows with oncology.   Persistent atrial fibrillation/chronic anticoagulation/High risk medication use - EKG today SB 55 bpm with 1st degree AV block (PR 214) and stable RBBB. No lightheadedness, dizziness, near syncope, syncope. Asymptomatic in regards to sinus bradycardia. If becomes symptomatic, could consider reduced dose Metoprolol. Continue Warfarin for anticoagulation, denies bleeding complications. Xarelto previously cost prohibitive. CHA2DS2-VASc Score = 5 [CHF History: 0, HTN History: 1, Diabetes History: 1, Stroke History: 0, Vascular Disease History: 1, Age Score: 1, Gender Score: 1].  Therefore, the patient's annual risk of stroke is 7.2 %.     On flecainide due to persistent atrial fibrillation.  AV nodal blocking agent and metoprolol.  CT chest 02/2021 with mild coronary calcification. Will route to Dr. [name] for  input.  Hypertension - BP well controlled. Continue current antihypertensive regimen.    RBBB - Stable finding by EKG. Asymptomatic - continue to monitor with periodic EKG.   Coronary artery calcficiation on CT - Inadvertent finding by CT 02/2021 detailed above. Stable with no anginal symptoms. No indication for ischemic evaluation.  With concomitant Flecainide use will reach out to primary cardiologist for guidance.    Hyperlipidemia - Continue Simvastatin 92m QD. 12/2020 LDL 73.   Disposition: Follow up in 1 year(s) with TFransico Him MD or APP.  Signed, CLoel Dubonnet NP 04/18/2021, 2:46 PM [facility]

## 2021-04-19 ENCOUNTER — Encounter (HOSPITAL_BASED_OUTPATIENT_CLINIC_OR_DEPARTMENT_OTHER): Payer: Self-pay | Admitting: Family

## 2021-04-22 ENCOUNTER — Telehealth: Payer: Self-pay | Admitting: Oncology

## 2021-04-22 NOTE — Telephone Encounter (Signed)
See clearance in chart from 04/04/21. ?

## 2021-04-22 NOTE — Telephone Encounter (Signed)
Called patient regarding upcoming March appointment, left a voicemail. 

## 2021-04-25 ENCOUNTER — Telehealth (HOSPITAL_BASED_OUTPATIENT_CLINIC_OR_DEPARTMENT_OTHER): Payer: Self-pay | Admitting: Family

## 2021-04-25 ENCOUNTER — Ambulatory Visit: Payer: Medicare Other

## 2021-04-25 ENCOUNTER — Other Ambulatory Visit: Payer: Self-pay

## 2021-04-25 DIAGNOSIS — I4819 Other persistent atrial fibrillation: Secondary | ICD-10-CM | POA: Diagnosis not present

## 2021-04-25 DIAGNOSIS — Z5181 Encounter for therapeutic drug level monitoring: Secondary | ICD-10-CM

## 2021-04-25 DIAGNOSIS — Z7189 Other specified counseling: Secondary | ICD-10-CM | POA: Diagnosis not present

## 2021-04-25 DIAGNOSIS — I480 Paroxysmal atrial fibrillation: Secondary | ICD-10-CM | POA: Diagnosis not present

## 2021-04-25 DIAGNOSIS — I2510 Atherosclerotic heart disease of native coronary artery without angina pectoris: Secondary | ICD-10-CM

## 2021-04-25 LAB — PROTIME-INR
INR: >10 (ref 0.9–1.2)
INR: 7.5 (ref 0.8–1.2)
Prothrombin Time: 93.1 s — ABNORMAL HIGH (ref 9.1–12.0)
Prothrombin Time: 63.6 seconds — ABNORMAL HIGH (ref 11.4–15.2)

## 2021-04-25 LAB — POCT INR: INR: 8 — AB (ref 2.0–3.0)

## 2021-04-25 NOTE — Addendum Note (Signed)
Addended by: Loel Dubonnet on: 04/25/2021 11:43 AM ? ? Modules accepted: Orders ? ?

## 2021-04-25 NOTE — Patient Instructions (Signed)
Description   ?POC INR >8: Pt sent to lab to draw STAT PT/INR. Instructed pt to HOLD Warfarin until further notice.  ?3:11pm: STAT lab resulted >10. Order placed for specimen to be sent to hospital lab.  ?4:35pm STAT INR 7.5 from hospital lab. Called pt advised to hold Warfarin until recheck on Friday 3/17.  Pt will continue to hold through the weekend prior to surgery 3/20 as long as INR is significantly dropping. If INR remains elevated on Friday may need to contact surgeon and make him aware. Advised to monitor for bleeding and  go to ED with bleeding problems/issues.  ?Normal Dosage: Warfarin 1 tablet daily except for 1/2 a tablet on Tuesdays and Thursdays.  ?Recheck INR Friday (04/29/21) ?Stay consistent with greens (2 servings per week).  ?Call our office if you have any questions or if placed on any new medications [phone]. ?  ?  ?

## 2021-04-25 NOTE — Telephone Encounter (Signed)
Pt had CT chest 02/2021 with mild coronary calcification. Discussed with Dr. [name], plan for coronary calcium score or cardiac CTA if insurance will cover. This is to quantify how much plaque is in the heart to allow for secondary prevention. This will not interfere with previously provided surgical clearance as she has no anginal symptoms.  ? ?Reached out to Miss Studzinski today. No answer and unable to leave VM. Will send MyChart message and continue to attempt to reach her. ? ?Loel Dubonnet, NP  ?

## 2021-04-25 NOTE — Telephone Encounter (Signed)
Spoke with Miss Latasha Hodges and she is interested in completing testing prior to her upcoming cruise 06/03/21. She endorses contrast allergy and has hesitation even with premedication. As she is without anginal symptoms, will plan for $99 calcium score which does not require contrast. She will call [phone] to scheduled.  ? ?If significant calcium score may need to consider further evaluation with myoview given Flecainide usage. She is without anginal symptoms.  ? ?Loel Dubonnet, NP  ?

## 2021-04-25 NOTE — Telephone Encounter (Signed)
Patient is returning call. Call transferred to [facility], NP. ?

## 2021-04-26 ENCOUNTER — Other Ambulatory Visit: Payer: Self-pay

## 2021-04-26 ENCOUNTER — Other Ambulatory Visit: Payer: Self-pay | Admitting: Cardiology

## 2021-04-26 ENCOUNTER — Encounter (HOSPITAL_BASED_OUTPATIENT_CLINIC_OR_DEPARTMENT_OTHER): Payer: Self-pay | Admitting: Orthopedic Surgery

## 2021-04-26 NOTE — Progress Notes (Addendum)
Spoke w/ via phone for pre-op interview--- Latasha Hodges ?Lab needs dos----  ISTAT             ?Lab results------EKG in Epic dated 04/18/2021 ?COVID test -----patient states asymptomatic no test needed ?Arrive at -------0845 ?NPO after MN NO Solid Food.  Clear liquids from MN until--- water until 0745 ?Med rec completed ?Medications to take morning of surgery -----Synthroid ?Diabetic medication ----- None morning of procedure. ?Patient instructed no nail polish to be worn day of surgery ?Patient instructed to bring photo id and insurance card day of surgery ?Patient aware to have Driver (ride ) / caregiver    for 24 hours after surgery. Aware she needs a ride and someone to stay with her 24 hrs. Will let us know AM of surgery. ?Patient Special Instructions ----- ?Pre-Op special Istructions ----- Per cardiac clearance note(04/18/21) patient can hold Coumadin 5 days prior to surgery. ?Patient verbalized understanding of instructions that were given at this phone interview. ?Patient denies shortness of breath, chest pain, fever, cough at this phone interview.  ?

## 2021-04-27 ENCOUNTER — Ambulatory Visit: Payer: Medicare Other | Admitting: Physician Assistant

## 2021-04-28 ENCOUNTER — Other Ambulatory Visit: Payer: Self-pay

## 2021-04-28 ENCOUNTER — Ambulatory Visit (HOSPITAL_BASED_OUTPATIENT_CLINIC_OR_DEPARTMENT_OTHER)
Admission: RE | Admit: 2021-04-28 | Discharge: 2021-04-28 | Disposition: A | Discharge status: Home / Self Care | Payer: Medicare Other | Source: Ambulatory Visit | Attending: Family | Admitting: Family

## 2021-04-28 DIAGNOSIS — I2510 Atherosclerotic heart disease of native coronary artery without angina pectoris: Secondary | ICD-10-CM | POA: Insufficient documentation

## 2021-04-29 ENCOUNTER — Telehealth: Payer: Self-pay

## 2021-04-29 ENCOUNTER — Encounter: Payer: Self-pay | Admitting: Cardiology

## 2021-04-29 ENCOUNTER — Ambulatory Visit: Payer: Medicare Other

## 2021-04-29 DIAGNOSIS — E782 Mixed hyperlipidemia: Secondary | ICD-10-CM

## 2021-04-29 DIAGNOSIS — I4819 Other persistent atrial fibrillation: Secondary | ICD-10-CM | POA: Diagnosis not present

## 2021-04-29 DIAGNOSIS — I480 Paroxysmal atrial fibrillation: Secondary | ICD-10-CM

## 2021-04-29 DIAGNOSIS — R931 Abnormal findings on diagnostic imaging of heart and coronary circulation: Secondary | ICD-10-CM | POA: Insufficient documentation

## 2021-04-29 DIAGNOSIS — Z7189 Other specified counseling: Secondary | ICD-10-CM | POA: Diagnosis not present

## 2021-04-29 DIAGNOSIS — Z5181 Encounter for therapeutic drug level monitoring: Secondary | ICD-10-CM

## 2021-04-29 LAB — POCT INR: INR: 1.6 — AB (ref 2.0–3.0)

## 2021-04-29 NOTE — Telephone Encounter (Signed)
The patient has been notified of the result and verbalized understanding.  All questions (if any) were answered. ?Antonieta Iba, RN 04/29/2021 12:55 PM  ?Labs ordered.  ?

## 2021-04-29 NOTE — Patient Instructions (Signed)
-   continue holding warfarin prior to your procedure on 3/20 ?- on the evening after your surgery, if ok'd by your surgeon, resume your warfarin dosage of Warfarin 1 tablet daily except for 1/2 a tablet on Tuesdays and Thursdays.  ?Recheck INR 1 week after your surgery ?Stay consistent with greens (2 servings per week).  ?Call our office if you have any questions or if placed on any new medications [phone]. ?

## 2021-04-29 NOTE — Telephone Encounter (Signed)
-----   Message from Sueanne Margarita, MD sent at 04/29/2021 12:17 PM EDT ----- ?Please let patient know she does have coronary calcifications which is consistent with underlying coronary disease.Coronary calcium score was 27 which is low at 54th percentile for age and sex matched controls.  Please have her come in for FLP and ALT ?

## 2021-05-01 NOTE — H&P (Signed)
Preoperative History & Physical Exam ? ?Surgeon: Matt Holmes, MD ? ?Diagnosis: left wrist fracture hardware in place ? ?Planned Procedure: Procedure(s) (LRB): ?left wrist removal of hardware- dorsal spanning plate (Left) ? ?History of Present Illness:   ?Patient is a 76 y.o. female with symptoms consistent with left wrist fracture hardware in place who presents for surgical intervention. The risks, benefits and alternatives of surgical intervention were discussed and informed consent was obtained prior to surgery. ? ?Past Medical History:  ?Past Medical History:  ?Diagnosis Date  ? Acquired solitary kidney   ? left side;   s/p right nephrectomy 12/ 2021  ? [place] coronary artery calcium score less than 100   ? Coronary calcium score was 27 which is low at 54th percentile for age and sex matched controls.  ? Anticoagulated on Coumadin   ? long-term;  managed by cardiology  ? Aortic atherosclerosis ([place])   ? Arthritis   ? Chronic kidney disease (CKD), stage III (moderate) ([facility])   ? Hyperlipidemia, mixed   ? Hypertension   ? Left wrist fracture   ? Malignant neoplasm of kidney, right ([place]) 01/2020  ? oncologist--- dr Alen Blew;  dx 12/ 2021  Stage IIIa,  Clear cell type;  01-29-2020  s/p robotic right radical nephrectomy (one posisitve node);  chemo 03-31-2020 to  08-11-2020  ? Persistent atrial fibrillation ([place])   ? cardiologist--- dr t. turner;  nuclear sress test-- 08-15-2017 low risk normal perfusion, nuclear ef 61%;  event monitor 12-18-2017 in epic  ? RBBB (right bundle branch block)   ? chronic  ? Type 2 diabetes mellitus ([place])   ? ? ?Past Surgical History:  ?Past Surgical History:  ?Procedure Laterality Date  ? COLONOSCOPY  12/29/2010  ? Procedure: COLONOSCOPY;  Surgeon: Owens Loffler, MD;  [facility];  Service: Endoscopy;  Laterality: N/A;  ? IR IMAGING GUIDED PORT INSERTION  07/08/2020  ? LUMBAR LAMINECTOMY/DECOMPRESSION MICRODISCECTOMY Left 07/14/2020  ? Procedure: Laminectomy and Foraminotomy  - Lumbar three-Lumbar four- Lumbar four-Lumbar five - left with sublaminar decompression;  Surgeon: Eustace Moore, MD;  Location: [place];  Service: Neurosurgery;  Laterality: Left;  ? ORIF WRIST FRACTURE Left 02/10/2021  ? Procedure: OPEN REDUCTION INTERNAL FIXATION (ORIF) WRIST FRACTURE;  Surgeon: [name], MD;  [facility];  Service: Orthopedics;  Laterality: Left;  with MAC  ? ROBOT ASSISTED LAPAROSCOPIC NEPHRECTOMY N/A 01/29/2020  ? Procedure: XI ROBOTIC ASSISTED LAPAROSCOPIC RADICAL NEPHRECTOMY;  Surgeon: Raynelle Bring, MD;  [facility];  Service: Urology;  Laterality: N/A;  ? TUBAL LIGATION  1980  ? ? ?Medications:  ?Prior to Admission medications   ?Medication Sig Start Date End Date Taking? Authorizing Provider  ?clotrimazole-betamethasone (LOTRISONE) cream Apply 1 application topically 2 (two) times daily. May use with OTC zinc oxide cream. 06/04/20  Yes Harle Stanford., PA-C  ?Dulaglutide (TRULICITY) 1.5 YB/6.3SL SOPN Inject 1.5 mg into the skin every Thursday.   Yes [provider]  ?fenofibrate micronized (LOFIBRA) 200 MG capsule TAKE 1 CAPSULE BY MOUTH DAILY BEFORE BREAKFAST 03/02/21  Yes Turner, Eber Hong, MD  ?flecainide (TAMBOCOR) 50 MG tablet TAKE 1 TABLET(50 MG) BY MOUTH TWICE DAILY Patient must keep 06/2021 appointment for further refills 03/07/21  Yes Turner, Eber Hong, MD  ?levothyroxine (SYNTHROID) 75 MCG tablet Take 75 mcg by mouth every morning. 01/04/21  Yes [provider]  ?lisinopril (ZESTRIL) 20 MG tablet TAKE 1 TABLET(20 MG) BY MOUTH DAILY 04/21/20  Yes Turner, Eber Hong, MD  ?magnesium gluconate (MAGONATE)  500 MG tablet Take 500 mg by mouth daily.   Yes [provider]  ?metFORMIN (GLUCOPHAGE) 1000 MG tablet Take 1,000 mg by mouth 2 (two) times daily. 01/07/14  Yes [provider]  ?metoprolol succinate (TOPROL-XL) 50 MG 24 hr tablet TAKE 1 TABLET(50 MG) BY MOUTH DAILY 05/19/20  Yes Turner, Eber Hong, MD  ?simvastatin (ZOCOR) 40 MG tablet TAKE 1  TABLET(40 MG) BY MOUTH DAILY AT 6 PM 04/26/21  Yes Turner, Traci R, MD  ?warfarin (COUMADIN) 5 MG tablet TAKE AS DIRECTED BY COUMADIN CLINIC 10/27/20  Yes Turner, Eber Hong, MD  ?triamcinolone ointment (KENALOG) 0.5 % Apply 1 application topically 2 (two) times daily. 11/05/20   Wyatt Portela, MD  ? ? ?Allergies:  Iodinated contrast media, No healthtouch food allergies, Penicillins, Sulfa antibiotics, and Theophyllines ? ?Review of Systems: Negative except per HPI. ? ?Physical Exam: ?Alert and oriented, NAD ?Head and neck: no masses, normal alignment ?CV: pulse intact ?Pulm: no increased work of breathing, respirations even and unlabored ?Abdomen: non-distended ?Extremities: extremities warm and well perfused ? ?LABS: ?Recent Results (from the past 2160 hour(s))  ?POCT INR     Status: None  ? Collection Time: 02/02/21  3:42 PM  ?Result Value Ref Range  ? INR 2.6 2.0 - 3.0  ?SARS CORONAVIRUS 2 (TAT 6-24 HRS) Nasopharyngeal Nasopharyngeal Swab     Status: None  ? Collection Time: 02/09/21  9:47 AM  ? Specimen: Nasopharyngeal Swab  ?Result Value Ref Range  ? SARS Coronavirus 2 NEGATIVE NEGATIVE  ?  Comment: (NOTE) ?SARS-CoV-2 target nucleic acids are NOT DETECTED. ? ?The SARS-CoV-2 RNA is generally detectable in upper and lower ?respiratory specimens during the acute phase of infection. Negative ?results do not preclude SARS-CoV-2 infection, do not rule out ?co-infections with other pathogens, and should not be used as the ?sole basis for treatment or other patient management decisions. ?Negative results must be combined with clinical observations, ?patient history, and epidemiological information. The expected ?result is Negative. ? ?Fact Sheet for Patients: ?SugarRoll.be ? ?Fact Sheet for Healthcare Providers: ?https://www.woods-mathews.com/ ? ?This test is not yet approved or cleared by the [place] FDA and  ?has been authorized for detection and/or diagnosis of SARS-CoV-2  by ?FDA under an Emergency Use Authorization (EUA). This EUA will remain  ?in effect (meaning this test can be used) for the duration of the ?COVID-19 declaration under Se ction 564(b)(1) of the Act, 21 U.S.C. ?section 360bbb-3(b)(1), unless the authorization is terminated or ?revoked sooner. ? ?Performed at [facility], [place] [address] [place], [place] ?[address] ?  ?Glucose, capillary     Status: Abnormal  ? Collection Time: 02/09/21 10:48 AM  ?Result Value Ref Range  ? Glucose-Capillary 116 (H) 70 - 99 mg/dL  ?  Comment: Glucose reference range applies only to samples taken after fasting for at least 8 hours.  ?Hemoglobin A1c per protocol     Status: Abnormal  ? Collection Time: 02/09/21 11:43 AM  ?Result Value Ref Range  ? Hgb A1c MFr Bld 7.2 (H) 4.8 - 5.6 %  ?  Comment: (NOTE) ?Pre diabetes:          5.7%-6.4% ? ?Diabetes:              >6.4% ? ?Glycemic control for   <7.0% ?adults with diabetes ?  ? Mean Plasma Glucose 159.94 mg/dL  ?  Comment: Performed at [facility], [place] [address] [place], [place] [address]  ?Basic metabolic panel per  protocol     Status: Abnormal  ? Collection Time: 02/09/21 11:43 AM  ?Result Value Ref Range  ? Sodium 137 135 - 145 mmol/L  ? Potassium 4.6 3.5 - 5.1 mmol/L  ? Chloride 105 98 - 111 mmol/L  ? CO2 26 22 - 32 mmol/L  ? Glucose, Bld 131 (H) 70 - 99 mg/dL  ?  Comment: Glucose reference range applies only to samples taken after fasting for at least 8 hours.  ? BUN 36 (H) 8 - 23 mg/dL  ? Creatinine, Ser 0.97 0.44 - 1.00 mg/dL  ? Calcium 9.3 8.9 - 10.3 mg/dL  ? GFR, Estimated >60 >60 mL/min  ?  Comment: (NOTE) ?Calculated using the CKD-EPI Creatinine Equation (2021) ?  ? Anion gap 6 5 - 15  ?  Comment: Performed at [facility], [place] [address] [place], [place] [address]  ?CBC per protocol     Status: Abnormal  ? Collection Time: 02/09/21 11:43 AM  ?Result Value Ref Range  ? WBC 5.1 4.0 - 10.5 K/uL  ? RBC 3.79 (L) 3.87 - 5.11 MIL/uL  ? Hemoglobin  10.6 (L) 12.0 - 15.0 g/dL  ? HCT 34.1 (L) 36.0 - 46.0 %  ? MCV 90.0 80.0 - 100.0 fL  ? MCH 28.0 26.0 - 34.0 pg  ? MCHC 31.1 30.0 - 36.0 g/dL  ? RDW 14.0 11.5 - 15.5 %  ? Platelets 259 150 - 400 K/uL  ? nRBC

## 2021-05-02 ENCOUNTER — Other Ambulatory Visit: Payer: Self-pay

## 2021-05-02 ENCOUNTER — Ambulatory Visit (HOSPITAL_BASED_OUTPATIENT_CLINIC_OR_DEPARTMENT_OTHER): Payer: No Typology Code available for payment source | Admitting: Anesthesiology

## 2021-05-02 ENCOUNTER — Encounter (HOSPITAL_BASED_OUTPATIENT_CLINIC_OR_DEPARTMENT_OTHER): Payer: Self-pay | Admitting: Orthopedic Surgery

## 2021-05-02 ENCOUNTER — Ambulatory Visit (HOSPITAL_BASED_OUTPATIENT_CLINIC_OR_DEPARTMENT_OTHER)
Admission: RE | Admit: 2021-05-02 | Discharge: 2021-05-02 | Disposition: A | Discharge status: Home / Self Care | Payer: No Typology Code available for payment source | Attending: Orthopedic Surgery | Admitting: Orthopedic Surgery

## 2021-05-02 ENCOUNTER — Encounter (HOSPITAL_BASED_OUTPATIENT_CLINIC_OR_DEPARTMENT_OTHER)
Admission: RE | Disposition: A | Discharge status: Home / Self Care | Payer: Self-pay | Source: Home / Self Care | Attending: Orthopedic Surgery

## 2021-05-02 ENCOUNTER — Ambulatory Visit (HOSPITAL_BASED_OUTPATIENT_CLINIC_OR_DEPARTMENT_OTHER): Payer: No Typology Code available for payment source

## 2021-05-02 DIAGNOSIS — E1122 Type 2 diabetes mellitus with diabetic chronic kidney disease: Secondary | ICD-10-CM | POA: Insufficient documentation

## 2021-05-02 DIAGNOSIS — E119 Type 2 diabetes mellitus without complications: Secondary | ICD-10-CM | POA: Diagnosis not present

## 2021-05-02 DIAGNOSIS — Z87891 Personal history of nicotine dependence: Secondary | ICD-10-CM | POA: Insufficient documentation

## 2021-05-02 DIAGNOSIS — N183 Chronic kidney disease, stage 3 unspecified: Secondary | ICD-10-CM | POA: Diagnosis not present

## 2021-05-02 DIAGNOSIS — I10 Essential (primary) hypertension: Secondary | ICD-10-CM | POA: Diagnosis not present

## 2021-05-02 DIAGNOSIS — Z6837 Body mass index (BMI) 37.0-37.9, adult: Secondary | ICD-10-CM | POA: Insufficient documentation

## 2021-05-02 DIAGNOSIS — Z7984 Long term (current) use of oral hypoglycemic drugs: Secondary | ICD-10-CM

## 2021-05-02 DIAGNOSIS — Z472 Encounter for removal of internal fixation device: Secondary | ICD-10-CM | POA: Diagnosis present

## 2021-05-02 DIAGNOSIS — Z85528 Personal history of other malignant neoplasm of kidney: Secondary | ICD-10-CM | POA: Insufficient documentation

## 2021-05-02 DIAGNOSIS — E039 Hypothyroidism, unspecified: Secondary | ICD-10-CM | POA: Diagnosis not present

## 2021-05-02 DIAGNOSIS — Z905 Acquired absence of kidney: Secondary | ICD-10-CM | POA: Insufficient documentation

## 2021-05-02 DIAGNOSIS — Z79899 Other long term (current) drug therapy: Secondary | ICD-10-CM | POA: Diagnosis not present

## 2021-05-02 DIAGNOSIS — I129 Hypertensive chronic kidney disease with stage 1 through stage 4 chronic kidney disease, or unspecified chronic kidney disease: Secondary | ICD-10-CM | POA: Insufficient documentation

## 2021-05-02 DIAGNOSIS — S62102A Fracture of unspecified carpal bone, left wrist, initial encounter for closed fracture: Secondary | ICD-10-CM

## 2021-05-02 HISTORY — PX: HARDWARE REMOVAL: SHX979

## 2021-05-02 LAB — POCT I-STAT, CHEM 8
BUN: 37 mg/dL — ABNORMAL HIGH (ref 8–23)
Calcium, Ion: 1.25 mmol/L (ref 1.15–1.40)
Chloride: 105 mmol/L (ref 98–111)
Creatinine, Ser: 1 mg/dL (ref 0.44–1.00)
Glucose, Bld: 198 mg/dL — ABNORMAL HIGH (ref 70–99)
HCT: 33 % — ABNORMAL LOW (ref 36.0–46.0)
Hemoglobin: 11.2 g/dL — ABNORMAL LOW (ref 12.0–15.0)
Potassium: 3.9 mmol/L (ref 3.5–5.1)
Sodium: 140 mmol/L (ref 135–145)
TCO2: 23 mmol/L (ref 22–32)

## 2021-05-02 LAB — GLUCOSE, CAPILLARY: Glucose-Capillary: 185 mg/dL — ABNORMAL HIGH (ref 70–99)

## 2021-05-02 SURGERY — REMOVAL, HARDWARE
Anesthesia: Monitor Anesthesia Care | Site: Hand | Laterality: Left

## 2021-05-02 MED ORDER — CEFAZOLIN SODIUM-DEXTROSE 2-4 GM/100ML-% IV SOLN
INTRAVENOUS | Status: AC
  Filled 2021-05-02: qty 100

## 2021-05-02 MED ORDER — FENTANYL CITRATE (PF) 100 MCG/2ML IJ SOLN: 25.0000 ug | INTRAMUSCULAR | Status: DC | PRN

## 2021-05-02 MED ORDER — SODIUM CHLORIDE 0.9 % IV SOLN: INTRAVENOUS | Status: DC

## 2021-05-02 MED ORDER — LACTATED RINGERS IV SOLN: INTRAVENOUS | Status: DC

## 2021-05-02 MED ORDER — MIDAZOLAM HCL 2 MG/2ML IJ SOLN
INTRAMUSCULAR | Status: AC
  Filled 2021-05-02: qty 2

## 2021-05-02 MED ORDER — OXYCODONE HCL 5 MG PO TABS: 5.0000 mg | ORAL_TABLET | Freq: Once | ORAL | Status: DC | PRN

## 2021-05-02 MED ORDER — BUPIVACAINE HCL (PF) 0.5 % IJ SOLN
INTRAMUSCULAR | Status: DC | PRN
  Administered 2021-05-02: 20 mL

## 2021-05-02 MED ORDER — FENTANYL CITRATE (PF) 100 MCG/2ML IJ SOLN
INTRAMUSCULAR | Status: AC
  Filled 2021-05-02: qty 2

## 2021-05-02 MED ORDER — MIDAZOLAM HCL 2 MG/2ML IJ SOLN
INTRAMUSCULAR | Status: DC | PRN
  Administered 2021-05-02 (×2): 1 mg via INTRAVENOUS

## 2021-05-02 MED ORDER — ONDANSETRON HCL 4 MG/2ML IJ SOLN
INTRAMUSCULAR | Status: DC | PRN
  Administered 2021-05-02: 4 mg via INTRAVENOUS

## 2021-05-02 MED ORDER — MEPERIDINE HCL 25 MG/ML IJ SOLN: 6.2500 mg | INTRAMUSCULAR | Status: DC | PRN

## 2021-05-02 MED ORDER — LIDOCAINE HCL (PF) 1 % IJ SOLN
INTRAMUSCULAR | Status: DC | PRN
  Administered 2021-05-02: 20 mL

## 2021-05-02 MED ORDER — ACETAMINOPHEN 500 MG PO TABS
1000.0000 mg | ORAL_TABLET | Freq: Once | ORAL | Status: AC
  Administered 2021-05-02: 1000 mg via ORAL

## 2021-05-02 MED ORDER — PROPOFOL 10 MG/ML IV BOLUS
INTRAVENOUS | Status: DC | PRN
  Administered 2021-05-02 (×5): 10 mg via INTRAVENOUS

## 2021-05-02 MED ORDER — FENTANYL CITRATE (PF) 100 MCG/2ML IJ SOLN
INTRAMUSCULAR | Status: DC | PRN
  Administered 2021-05-02: 25 ug via INTRAVENOUS

## 2021-05-02 MED ORDER — ACETAMINOPHEN 500 MG PO TABS
ORAL_TABLET | ORAL | Status: AC
  Filled 2021-05-02: qty 2

## 2021-05-02 MED ORDER — PROPOFOL 500 MG/50ML IV EMUL
INTRAVENOUS | Status: DC | PRN
  Administered 2021-05-02: 75 ug/kg/min via INTRAVENOUS

## 2021-05-02 MED ORDER — 0.9 % SODIUM CHLORIDE (POUR BTL) OPTIME
TOPICAL | Status: DC | PRN
Start: 2021-05-02 — End: 2021-05-02
  Administered 2021-05-02: 500 mL

## 2021-05-02 MED ORDER — OXYCODONE HCL 5 MG/5ML PO SOLN: 5.0000 mg | Freq: Once | ORAL | Status: DC | PRN

## 2021-05-02 MED ORDER — BACITRACIN ZINC 500 UNIT/GM EX OINT
TOPICAL_OINTMENT | CUTANEOUS | Status: DC | PRN
  Administered 2021-05-02: 1 via TOPICAL

## 2021-05-02 MED ORDER — MIDAZOLAM HCL 2 MG/2ML IJ SOLN: 0.5000 mg | Freq: Once | INTRAMUSCULAR | Status: DC | PRN

## 2021-05-02 MED ORDER — CEFAZOLIN SODIUM-DEXTROSE 2-4 GM/100ML-% IV SOLN
2.0000 g | INTRAVENOUS | Status: AC
  Administered 2021-05-02: 2 g via INTRAVENOUS

## 2021-05-02 SURGICAL SUPPLY — 50 items
BLADE SURG 15 STRL LF DISP TIS (BLADE) ×2 IMPLANT
BLADE SURG 15 STRL SS (BLADE) ×4
BNDG CMPR 9X4 STRL LF SNTH (GAUZE/BANDAGES/DRESSINGS) ×1
BNDG COHESIVE 1X5 TAN STRL LF (GAUZE/BANDAGES/DRESSINGS) IMPLANT
BNDG ELASTIC 2X5.8 VLCR STR LF (GAUZE/BANDAGES/DRESSINGS) IMPLANT
BNDG ELASTIC 3X5.8 VLCR STR LF (GAUZE/BANDAGES/DRESSINGS) ×1 IMPLANT
BNDG ELASTIC 4X5.8 VLCR STR LF (GAUZE/BANDAGES/DRESSINGS) ×2 IMPLANT
BNDG ESMARK 4X9 LF (GAUZE/BANDAGES/DRESSINGS) ×2 IMPLANT
CORD BIPOLAR FORCEPS 12FT (ELECTRODE) ×2 IMPLANT
COVER BACK TABLE 60X90IN (DRAPES) ×2 IMPLANT
CUFF TOURN SGL QUICK 18X4 (TOURNIQUET CUFF) IMPLANT
CUFF TOURN SGL QUICK 24 (TOURNIQUET CUFF)
CUFF TRNQT CYL 24X4X16.5-23 (TOURNIQUET CUFF) IMPLANT
DECANTER SPIKE VIAL GLASS SM (MISCELLANEOUS) IMPLANT
DRAPE EXTREMITY T 121X128X90 (DISPOSABLE) ×2 IMPLANT
DRAPE OEC MINIVIEW 54X84 (DRAPES) ×2 IMPLANT
DRAPE SHEET LG 3/4 BI-LAMINATE (DRAPES) ×2 IMPLANT
DRAPE SURG 17X23 STRL (DRAPES) IMPLANT
DRSG ADAPTIC 3X8 NADH LF (GAUZE/BANDAGES/DRESSINGS) ×1 IMPLANT
DRSG EMULSION OIL 3X3 NADH (GAUZE/BANDAGES/DRESSINGS) ×2 IMPLANT
GAUZE 4X4 16PLY ~~LOC~~+RFID DBL (SPONGE) ×2 IMPLANT
GAUZE SPONGE 4X4 12PLY STRL (GAUZE/BANDAGES/DRESSINGS) ×2 IMPLANT
GLOVE SURG UNDER POLY LF SZ7.5 (GLOVE) ×4 IMPLANT
GOWN STRL REUS W/TWL LRG LVL3 (GOWN DISPOSABLE) ×2 IMPLANT
HIBICLENS CHG 4% 4OZ BTL (MISCELLANEOUS) ×2 IMPLANT
K-WIRE DBL END TROCAR 6X.045 (WIRE)
K-WIRE DBL END TROCAR 6X.062 (WIRE)
KIT TURNOVER CYSTO (KITS) ×2 IMPLANT
KWIRE DBL END TROCAR 6X.045 (WIRE) IMPLANT
KWIRE DBL END TROCAR 6X.062 (WIRE) IMPLANT
NEEDLE HYPO 22GX1.5 SAFETY (NEEDLE) ×1 IMPLANT
NS IRRIG 1000ML POUR BTL (IV SOLUTION) ×2 IMPLANT
NS IRRIG 500ML POUR BTL (IV SOLUTION) ×2 IMPLANT
PACK BASIN DAY SURGERY FS (CUSTOM PROCEDURE TRAY) ×2 IMPLANT
PAD CAST 3X4 CTTN HI CHSV (CAST SUPPLIES) IMPLANT
PAD CAST 4YDX4 CTTN HI CHSV (CAST SUPPLIES) ×2 IMPLANT
PADDING CAST COTTON 3X4 STRL (CAST SUPPLIES) ×2
PADDING CAST COTTON 4X4 STRL (CAST SUPPLIES) ×4
SLEEVE SCD COMPRESS KNEE MED (STOCKING) IMPLANT
STRIP CLOSURE SKIN 1/2X4 (GAUZE/BANDAGES/DRESSINGS) ×1 IMPLANT
SUCTION FRAZIER HANDLE 10FR (MISCELLANEOUS)
SUCTION TUBE FRAZIER 10FR DISP (MISCELLANEOUS) IMPLANT
SUT CHROMIC 4 0 PS 2 18 (SUTURE) IMPLANT
SUT ETHILON 4 0 PS 2 18 (SUTURE) ×2 IMPLANT
SYR 10ML LL (SYRINGE) ×2 IMPLANT
SYR BULB EAR ULCER 3OZ GRN STR (SYRINGE) ×2 IMPLANT
TOWEL OR 17X26 10 PK STRL BLUE (TOWEL DISPOSABLE) ×2 IMPLANT
TRAY DSU PREP LF (CUSTOM PROCEDURE TRAY) ×2 IMPLANT
TUBE CONNECTING 12X1/4 (SUCTIONS) IMPLANT
UNDERPAD 30X36 HEAVY ABSORB (UNDERPADS AND DIAPERS) ×2 IMPLANT

## 2021-05-02 NOTE — Discharge Instructions (Addendum)
?  Orthopaedic Hand Surgery Discharge Instructions ? ?WEIGHT BEARING STATUS: Non weight bearing on operative extremity ? ?DRESSING CARE: Please keep your dressing/splint/cast clean and dry until your follow-up appointment. You may shower by placing a waterproof covering over your dressing/splint/cast. Contact your surgeon if your splint/cast gets wet. It will need to be changed to prevent skin breakdown. ? ?PAIN CONTROL: First line medications for post operative pain control are Tylenol (acetaminophen) and Motrin (ibuprofen) if you are able to take these medications. If you have been prescribed a medication these can be taken as breakthrough pain medications. Please note that some narcotic pain medication has acetaminophen added and you should never consume more than 4,'000mg'$  of acetaminophen in 24-hour period. Please note that if you are given Toradol (ketorolac) you should not take similar medications such as ibuprofen or naproxen. ? ?DISCHARGE MEDICATIONS: If you have been prescribed medication it was sent electronically to your pharmacy. No changes have been made to your home medications. ? ?ICE/ELEVATION: Ice and elevate your injured extremity as needed. Avoid direct contact of ice with skin.  ? ?BANDAGE FEELS TOO TIGHT: If your bandage feels too tight, first make sure you are elevating your fingers as much as possible. The outer layer of the bandage can be unwrapped and reapplied more loosely. If no improvement, you may carefully cut the inner layer longitudinally until the pressure has resolved and then rewrap the outer layer. If you are not comfortable with these instructions, please call the office and the bandage can be changed for you.  ? ?FOLLOW UP: Friday of this week with hand therapy. Leave bandage on until this appointment. Follow up with Dr. Greta Doom in 2 weeks- you will be called to schedule this appointment.  ? ?Please Seek Medical Attention if: ?Call MD for: pain or pressure in chest, jaw, arm, back,  neck  ?Call MD for: temperature greater than 101 F for more than 24 hrs ?Call MD for: difficulty breathing ?Call MD for: incision redness, bleeding, drainage  ?Call MD for: palpitations or feeling that the heart is racing  ?Call MD for: increased swelling in arm, leg, ankle, or abdomen  ?Call MD for: lightheadedness, dizziness, fainting ?Call 911 or go to ER for any medical emergency if you are not able to get in touch with your doctor ? ? ?J. Sable Feil, MD ?Orthopaedic Hand Surgeon ?[facility] ?Office number: [phone] ?[address] ?[place], [place] [address] ? ? ?Post Anesthesia Home Care Instructions ? ?Activity: ?Get plenty of rest for the remainder of the day. A responsible individual must stay with you for 24 hours following the procedure.  ?For the next 24 hours, DO NOT: ?-Drive a car ?-Paediatric nurse ?-Drink alcoholic beverages ?-Take any medication unless instructed by your physician ?-Make any legal decisions or sign important papers. ? ?Meals: ?Start with liquid foods such as gelatin or soup. Progress to regular foods as tolerated. Avoid greasy, spicy, heavy foods. If nausea and/or vomiting occur, drink only clear liquids until the nausea and/or vomiting subsides. Call your physician if vomiting continues. ? ?Special Instructions/Symptoms: ?Your throat may feel dry or sore from the anesthesia or the breathing tube placed in your throat during surgery. If this causes discomfort, gargle with warm salt water. The discomfort should disappear within 24 hours. ? ?May take Tylenol beginning at 3 PM as needed for pain. ?    ?

## 2021-05-02 NOTE — Anesthesia Postprocedure Evaluation (Signed)
Anesthesia Post Note ? ?Patient: Bonnie Overdorf ? ?Procedure(s) Performed: left wrist removal of hardware- dorsal spanning plate (Left: Hand) ? ?  ? ?Patient location during evaluation: Phase II ?Anesthesia Type: Regional ?Level of consciousness: awake and alert, oriented and patient cooperative ?Pain management: pain level controlled ?Vital Signs Assessment: post-procedure vital signs reviewed and stable ?Respiratory status: spontaneous breathing, nonlabored ventilation and respiratory function stable ?Cardiovascular status: blood pressure returned to baseline and stable ?Postop Assessment: no apparent nausea or vomiting, adequate PO intake and able to ambulate ?Anesthetic complications: no ? ? ?No notable events documented. ? ?Last Vitals:  ?Vitals:  ? 05/02/21 1134 05/02/21 1145  ?BP:  137/76  ?Pulse: (!) 44 (!) 46  ?Resp:    ?Temp:    ?SpO2: 100% 100%  ?  ?Last Pain:  ?Vitals:  ? 05/02/21 1145  ?TempSrc:   ?PainSc: 0-No pain  ? ? ?  ?  ?  ?  ?  ?  ? ?Cambrie Sonnenfeld,E. Mariadel Mruk ? ? ? ? ?

## 2021-05-02 NOTE — OR Nursing (Signed)
7 screws and a plate removed per Dr. Greta Doom from left hand  ?

## 2021-05-02 NOTE — Anesthesia Preprocedure Evaluation (Addendum)
Anesthesia Evaluation  ?Patient identified by MRN, date of birth, ID band ?Patient awake ? ? ? ?Reviewed: ?Allergy & Precautions, NPO status , Patient's Chart, lab work & pertinent test results, reviewed documented beta blocker date and time  ? ?History of Anesthesia Complications ?Negative for: history of anesthetic complications ? ?Airway ?Mallampati: II ? ?TM Distance: >3 FB ?Neck ROM: Full ? ? ? Dental ? ?(+) Loose, Dental Advisory Given ?  ?Pulmonary ?former smoker,  ?  ?breath sounds clear to auscultation ? ? ? ? ? ? Cardiovascular ?hypertension, Pt. on medications and Pt. on home beta blockers ?(-) angina+ dysrhythmias Atrial Fibrillation  ?Rhythm:Irregular Rate:Normal ? ?'19 stress: Normal resting and stress perfusion. No ischemia or infarction, EF 61% ? ?  ?Neuro/Psych ?negative neurological ROS ?   ? GI/Hepatic ?negative GI ROS, Neg liver ROS,   ?Endo/Other  ?diabetes (glu 198), Oral Hypoglycemic AgentsHypothyroidism Morbid obesity ? Renal/GU ?Renal InsufficiencyRenal diseaseH/o renal cancer: s/p nephrectomy  ? ?  ?Musculoskeletal ? ? Abdominal ?(+) + obese,   ?Peds ? Hematology ?Coumadin   ?Anesthesia Other Findings ? ? Reproductive/Obstetrics ? ?  ? ? ? ? ? ? ? ? ? ? ? ? ? ?  ?  ? ? ? ? ? ? ? ?Anesthesia Physical ?Anesthesia Plan ? ?ASA: 3 ? ?Anesthesia Plan: MAC  ? ?Post-op Pain Management: Tylenol PO (pre-op)*  ? ?Induction:  ? ?PONV Risk Score and Plan: 2 and Ondansetron and Treatment may vary due to age or medical condition ? ?Airway Management Planned: Natural Airway and Simple Face Mask ? ?Additional Equipment: None ? ?Intra-op Plan:  ? ?Post-operative Plan:  ? ?Informed Consent: I have reviewed the patients History and Physical, chart, labs and discussed the procedure including the risks, benefits and alternatives for the proposed anesthesia with the patient or authorized representative who has indicated his/her understanding and acceptance.  ? ? ? ?Dental  advisory given ? ?Plan Discussed with: CRNA and Surgeon ? ?Anesthesia Plan Comments: (Plan routine monitors, MAC with local by surgeon)  ? ? ? ? ?Anesthesia Quick Evaluation ? ?

## 2021-05-02 NOTE — Interval H&P Note (Signed)
History and Physical Interval Note: ? ?05/02/2021 ?10:09 AM ? ?Lyberti Thrush  has presented today for surgery, with the diagnosis of left wrist fracture hardware in place.  The various methods of treatment have been discussed with the patient and family. After consideration of risks, benefits and other options for treatment, the patient has consented to  Procedure(s): ?left wrist removal of hardware- dorsal spanning plate (Left) as a surgical intervention.  The patient's history has been reviewed, patient examined, no change in status, stable for surgery.  I have reviewed the patient's chart and labs.  Questions were answered to the patient's satisfaction.   ? ? ?[name] ? ? ?

## 2021-05-02 NOTE — Op Note (Signed)
OPERATIVE NOTE ? ?DATE OF PROCEDURE: 05/02/2021 ? ?SURGEONS:  ?Primary: [name], MD ? ?PREOPERATIVE DIAGNOSIS: left wrist fracture hardware in place ? ?POSTOPERATIVE DIAGNOSIS: Same ? ?NAME OF PROCEDURE:  ? ?Left wrist removal of deep buried hardware- dorsal spanning plate ? ?ANESTHESIA: Regional ? ?SKIN PREPARATION: Hibiclens ? ?ESTIMATED BLOOD LOSS: Minimal ? ?IMPLANTS: none ? ?EXPLANTS: Biomet dorsal spanning plate and 7 screws. ? ?INDICATIONS:  Latasha Hodges is a 76 y.o. female who has the above preoperative diagnosis. The patient has decided to proceed with surgical intervention.  Risks, benefits and alternatives of operative management were discussed including, but not limited to, risks of anesthesia complications, infection, pain, persistent symptoms, stiffness, need for future surgery.  The patient understands, agrees and elects to proceed with surgery.   ? ?DESCRIPTION OF PROCEDURE: The patient was met in the pre-operative area and their identity was verified.  The operative location and laterality was also verified and marked.  The patient was brought to the OR and was placed supine on the table.  After repeat patient identification with the operative team anesthesia was provided and the patient was prepped and draped in the usual sterile fashion.  A final timeout was performed verifying the correction patient, procedure, location and laterality. ? ?Preoperative antibiotics were provided and then the left upper extremity was elevated exsanguinated with an Esmarch and tourniquet inflated to 250 mmHg.  Longitudinal incision made over the index metacarpal and radial shaft was performed.  Skin and subcutaneous tissues were divided and the extensor tendons were mobilized.  The plate and screws was identified both proximally and distally and 4 screws removed from metacarpal and 3 screws were removed from the radial shaft.  A Freer elevator was utilized to mobilize the plate.  This was then removed in its  entirety.  The screw holes were curetted and irrigated thoroughly with normal saline.  The incisions were then closed with 4-0 nylon suture in horizontal mattress fashion.  C-arm fluoroscopy confirmed complete removal of the dorsal spanning plate with 4 screws from the metacarpal and 3 screws on the radial shaft.  The dorsal buttress placed over the distal radius was left in place and there was no impingement with range of motion of the wrist.  The radiocarpal joint had no subluxation and the DRUJ was stable.  On the operating room table the patient had approximately 30 degrees of flexion extension of the wrist and full pronation and supination.  A sterile bandage was applied followed by a short arm splint.  The tourniquet was deflated and the fingers were pink and warm and well-perfused.  All counts were correct x2.  The patient was awoke from anesthesia and brought to PACU for recovery in stable condition. ? ?Postoperative plan: ?Follow-up in 4 days with hand therapy.  Bandage stays on until therapy.  They will remove the bandage and initiate range of motion of the wrist.  I am okay for the patient to get back to work and she can drive when she is off of the pain medicine.  ? ? ?Matt Holmes, MD ? ?

## 2021-05-02 NOTE — Transfer of Care (Signed)
Immediate Anesthesia Transfer of Care Note ? ?Patient: Alaisa Moffitt ? ?Procedure(s) Performed: left wrist removal of hardware- dorsal spanning plate (Left: Hand) ? ?Patient Location: PACU ? ?Anesthesia Type:MAC and Regional ? ?Level of Consciousness: awake, alert  and oriented ? ?Airway & Oxygen Therapy: Patient Spontanous Breathing ? ?Post-op Assessment: Report given to RN and Post -op Vital signs reviewed and stable ? ?Post vital signs: Reviewed and stable ? ?Last Vitals:  ?Vitals Value Taken Time  ?BP    ?Temp    ?Pulse    ?Resp    ?SpO2    ? ? ?Last Pain:  ?Vitals:  ? 05/02/21 0907  ?TempSrc: Oral  ?PainSc: 0-No pain  ?   ? ?Patients Stated Pain Goal: 8 (05/02/21 2620) ? ?Complications: No notable events documented. ?

## 2021-05-03 ENCOUNTER — Encounter (HOSPITAL_BASED_OUTPATIENT_CLINIC_OR_DEPARTMENT_OTHER): Payer: Self-pay | Admitting: Orthopedic Surgery

## 2021-05-03 ENCOUNTER — Ambulatory Visit (HOSPITAL_BASED_OUTPATIENT_CLINIC_OR_DEPARTMENT_OTHER): Payer: Self-pay | Admitting: Family

## 2021-05-04 ENCOUNTER — Other Ambulatory Visit: Payer: Self-pay | Admitting: Cardiology

## 2021-05-10 ENCOUNTER — Other Ambulatory Visit: Payer: Medicare Other

## 2021-05-11 ENCOUNTER — Ambulatory Visit: Payer: Medicare Other | Admitting: *Deleted

## 2021-05-11 ENCOUNTER — Other Ambulatory Visit: Payer: Medicare Other

## 2021-05-11 DIAGNOSIS — I4819 Other persistent atrial fibrillation: Secondary | ICD-10-CM | POA: Diagnosis not present

## 2021-05-11 DIAGNOSIS — I480 Paroxysmal atrial fibrillation: Secondary | ICD-10-CM | POA: Diagnosis not present

## 2021-05-11 DIAGNOSIS — Z5181 Encounter for therapeutic drug level monitoring: Secondary | ICD-10-CM | POA: Diagnosis not present

## 2021-05-11 DIAGNOSIS — Z7189 Other specified counseling: Secondary | ICD-10-CM

## 2021-05-11 LAB — POCT INR: INR: 3.3 — AB (ref 2.0–3.0)

## 2021-05-11 NOTE — Patient Instructions (Addendum)
Description   ?Hold warfarin today, then continue taking Warfarin 1 tablet daily except for 1/2 a tablet on Tuesdays and Thursdays.  ?Recheck INR in 1 week.  ?Stay consistent with greens (2 servings per week).  ?Call our office if you have any questions or if placed on any new medications [phone]. ?  ?  ?

## 2021-05-12 ENCOUNTER — Other Ambulatory Visit: Payer: Medicare Other

## 2021-05-12 DIAGNOSIS — E782 Mixed hyperlipidemia: Secondary | ICD-10-CM | POA: Diagnosis not present

## 2021-05-13 ENCOUNTER — Inpatient Hospital Stay: Payer: Medicare Other | Attending: Oncology

## 2021-05-13 ENCOUNTER — Inpatient Hospital Stay: Payer: Medicare Other | Admitting: Oncology

## 2021-05-13 ENCOUNTER — Other Ambulatory Visit: Payer: Self-pay

## 2021-05-13 ENCOUNTER — Telehealth: Payer: Self-pay

## 2021-05-13 VITALS — BP 163/75 | HR 60 | Temp 97.9°F | Resp 19 | Ht 64.0 in

## 2021-05-13 DIAGNOSIS — Z95828 Presence of other vascular implants and grafts: Secondary | ICD-10-CM

## 2021-05-13 DIAGNOSIS — Z905 Acquired absence of kidney: Secondary | ICD-10-CM | POA: Insufficient documentation

## 2021-05-13 DIAGNOSIS — Z9221 Personal history of antineoplastic chemotherapy: Secondary | ICD-10-CM | POA: Insufficient documentation

## 2021-05-13 DIAGNOSIS — Z79890 Hormone replacement therapy (postmenopausal): Secondary | ICD-10-CM | POA: Insufficient documentation

## 2021-05-13 DIAGNOSIS — D49511 Neoplasm of unspecified behavior of right kidney: Secondary | ICD-10-CM

## 2021-05-13 DIAGNOSIS — Z85528 Personal history of other malignant neoplasm of kidney: Secondary | ICD-10-CM | POA: Insufficient documentation

## 2021-05-13 DIAGNOSIS — E039 Hypothyroidism, unspecified: Secondary | ICD-10-CM | POA: Insufficient documentation

## 2021-05-13 LAB — CBC WITH DIFFERENTIAL (CANCER CENTER ONLY)
Abs Immature Granulocytes: 0.04 10*3/uL (ref 0.00–0.07)
Basophils Absolute: 0 10*3/uL (ref 0.0–0.1)
Basophils Relative: 1 %
Eosinophils Absolute: 0.1 10*3/uL (ref 0.0–0.5)
Eosinophils Relative: 2 %
HCT: 32.4 % — ABNORMAL LOW (ref 36.0–46.0)
Hemoglobin: 10.3 g/dL — ABNORMAL LOW (ref 12.0–15.0)
Immature Granulocytes: 1 %
Lymphocytes Relative: 29 %
Lymphs Abs: 1.7 10*3/uL (ref 0.7–4.0)
MCH: 27.6 pg (ref 26.0–34.0)
MCHC: 31.8 g/dL (ref 30.0–36.0)
MCV: 86.9 fL (ref 80.0–100.0)
Monocytes Absolute: 0.5 10*3/uL (ref 0.1–1.0)
Monocytes Relative: 9 %
Neutro Abs: 3.3 10*3/uL (ref 1.7–7.7)
Neutrophils Relative %: 58 %
Platelet Count: 263 10*3/uL (ref 150–400)
RBC: 3.73 MIL/uL — ABNORMAL LOW (ref 3.87–5.11)
RDW: 14.2 % (ref 11.5–15.5)
WBC Count: 5.7 10*3/uL (ref 4.0–10.5)
nRBC: 0 % (ref 0.0–0.2)

## 2021-05-13 LAB — CMP (CANCER CENTER ONLY)
ALT: 12 U/L (ref 0–44)
AST: 11 U/L — ABNORMAL LOW (ref 15–41)
Albumin: 3.8 g/dL (ref 3.5–5.0)
Alkaline Phosphatase: 41 U/L (ref 38–126)
Anion gap: 8 (ref 5–15)
BUN: 32 mg/dL — ABNORMAL HIGH (ref 8–23)
CO2: 26 mmol/L (ref 22–32)
Calcium: 9.2 mg/dL (ref 8.9–10.3)
Chloride: 106 mmol/L (ref 98–111)
Creatinine: 1.04 mg/dL — ABNORMAL HIGH (ref 0.44–1.00)
GFR, Estimated: 57 mL/min — ABNORMAL LOW (ref >60)
Glucose, Bld: 118 mg/dL — ABNORMAL HIGH (ref 70–99)
Potassium: 4.5 mmol/L (ref 3.5–5.1)
Sodium: 140 mmol/L (ref 135–145)
Total Bilirubin: 0.4 mg/dL (ref 0.3–1.2)
Total Protein: 6.7 g/dL (ref 6.5–8.1)

## 2021-05-13 LAB — LIPID PANEL
Chol/HDL Ratio: 3.2 ratio (ref 0.0–4.4)
Cholesterol, Total: 152 mg/dL (ref 100–199)
HDL: 48 mg/dL (ref >39)
LDL Chol Calc (NIH): 69 mg/dL (ref 0–99)
Triglycerides: 215 mg/dL — ABNORMAL HIGH (ref 0–149)
VLDL Cholesterol Cal: 35 mg/dL (ref 5–40)

## 2021-05-13 MED ORDER — SODIUM CHLORIDE 0.9% FLUSH
10.0000 mL | Freq: Once | INTRAVENOUS | Status: AC
  Administered 2021-05-13: 10 mL

## 2021-05-13 MED ORDER — ROSUVASTATIN CALCIUM 40 MG PO TABS: 40.0000 mg | ORAL_TABLET | Freq: Every day | ORAL | 3 refills | Status: DC

## 2021-05-13 MED ORDER — HEPARIN SOD (PORK) LOCK FLUSH 100 UNIT/ML IV SOLN
500.0000 [IU] | INTRAVENOUS | Status: AC | PRN
  Administered 2021-05-13: 500 [IU]

## 2021-05-13 NOTE — Progress Notes (Signed)
Hematology and Oncology Follow Up Visit ? ?Latasha Hodges ?[id number] ?April 06, 1945 76 y.o. ?05/13/2021 2:45 PM ?Latasha Hodges, MDGriffin, Latasha Sheffield, MD  ? ?Principle Diagnosis: 15 year old woman with kidney cancer diagnosed in December 2021.  She was found to have T3aN1 clear-cell tumor. ? ? ?Prior Therapy: ? ? She is status post right robotic assisted laparoscopic radical nephrectomy completed by Dr. Alinda Money on January 29, 2020.  The final pathology showed a 7.0 cm clear cell renal cell carcinoma with tumor extends into the renal vein, renal capsule and renal sinus fat indicating T3a disease with 1 lymph node involvement.  No sarcomatoid or rhabdoid features noted with histological grade of G3. ? ?Pembrolizumab 200 mg every 3 weeks started on March 31, 2020.  She received 400 mg starting with cycle 5 of therapy in May 2022.  Last cycle given on August 11, 2020. ? ?Current therapy: Active surveillance. ? ?Interim History: Latasha Hodges is here for a follow-up visit.  Since last visit, she reports no major changes in her health.  She has reported some bilateral ankle edema but no other complaints.  She denies any skin rashes or lesions.  She denies any worsening pruritus.  She denies any shortness of breath or difficulty breathing.  He continues to be active and attends to activities of daily living. ? ? ?Medications: Updated on review. ?Current Outpatient Medications  ?Medication Sig Dispense Refill  ? clotrimazole-betamethasone (LOTRISONE) cream Apply 1 application topically 2 (two) times daily. May use with OTC zinc oxide cream. 135 g 0  ? Dulaglutide (TRULICITY) 1.5 RJ/1.8AC SOPN Inject 1.5 mg into the skin every Thursday.    ? fenofibrate micronized (LOFIBRA) 200 MG capsule TAKE 1 CAPSULE BY MOUTH DAILY BEFORE BREAKFAST 90 capsule 2  ? flecainide (TAMBOCOR) 50 MG tablet TAKE 1 TABLET(50 MG) BY MOUTH TWICE DAILY Patient must keep 06/2021 appointment for further refills 180 tablet 1  ? levothyroxine (SYNTHROID) 75  MCG tablet Take 75 mcg by mouth every morning.    ? lisinopril (ZESTRIL) 20 MG tablet TAKE 1 TABLET(20 MG) BY MOUTH DAILY 90 tablet 3  ? magnesium gluconate (MAGONATE) 500 MG tablet Take 500 mg by mouth daily.    ? metFORMIN (GLUCOPHAGE) 1000 MG tablet Take 1,000 mg by mouth 2 (two) times daily.  0  ? metoprolol succinate (TOPROL-XL) 50 MG 24 hr tablet TAKE 1 TABLET(50 MG) BY MOUTH DAILY 90 tablet 3  ? rosuvastatin (CRESTOR) 40 MG tablet Take 1 tablet (40 mg total) by mouth daily. 90 tablet 3  ? triamcinolone ointment (KENALOG) 0.5 % Apply 1 application topically 2 (two) times daily. 45 g 3  ? warfarin (COUMADIN) 5 MG tablet TAKE AS DIRECTED BY COUMADIN CLINIC 90 tablet 1  ? ?No current facility-administered medications for this visit.  ? ? ? ?Allergies:  ?Allergies  ?Allergen Reactions  ? Iodinated Contrast Media Hives, Swelling and Rash  ? No [employer] Food Allergies Rash  ?  Seafood  ? Penicillins Rash  ?  Childhood reaction.  ? Sulfa Antibiotics Hives and Rash  ? Theophyllines Rash  ? ? ? ? ?Physical Exam: ? ? ? ? ? ? ?Blood pressure (!) 163/75, pulse 60, temperature 97.9 ?F (36.6 ?C), temperature source Temporal, resp. rate 19, height '5\' 4"'$  (1.626 m), SpO2 100 %. ? ? ? ?ECOG: 1 ? ? ? ? ?General appearance: Alert, awake without any distress. ?Head: Atraumatic without abnormalities ?Oropharynx: Without any thrush or ulcers. ?Eyes: No scleral icterus. ?Lymph nodes: No lymphadenopathy noted in the cervical, supraclavicular,  or axillary nodes ?Heart:regular rate and rhythm, without any murmurs or gallops.   ?Lung: Clear to auscultation without any rhonchi, wheezes or dullness to percussion. ?Abdomin: Soft, nontender without any shifting dullness or ascites. ?Musculoskeletal: No clubbing or cyanosis. ?Neurological: No motor or sensory deficits. ?Skin: No rashes or lesions. ? ? ? ? ? ? ? ? ? ? ? ? ? ?Lab Results: ?Lab Results  ?Component Value Date  ? WBC 5.4 03/02/2021  ? HGB 11.2 (L) 05/02/2021  ? HCT 33.0 (L)  05/02/2021  ? MCV 85.8 03/02/2021  ? PLT 287 03/02/2021  ? ?  Chemistry   ?   ?Component Value Date/Time  ? NA 140 05/02/2021 0924  ? NA 139 08/20/2017 1147  ? K 3.9 05/02/2021 0924  ? CL 105 05/02/2021 0924  ? CO2 25 03/02/2021 1324  ? BUN 37 (H) 05/02/2021 0924  ? BUN 18 08/20/2017 1147  ? CREATININE 1.00 05/02/2021 0924  ? CREATININE 0.97 03/02/2021 1324  ?    ?Component Value Date/Time  ? CALCIUM 9.5 03/02/2021 1324  ? ALKPHOS 45 03/02/2021 1324  ? AST 10 (L) 03/02/2021 1324  ? ALT 9 03/02/2021 1324  ? BILITOT 0.3 03/02/2021 1324  ?  ? ? ? ?. ? Latest Reference Range & Units 11/05/20 15:30 01/07/21 13:40 03/02/21 13:24  ?TSH 0.308 - 3.960 uIU/mL 52.575 (H) 35.586 (H) 7.225 (H)  ?(H): Data is abnormally high ?  ? ? ?Impression and Plan: ? ? ?76 year old woman with: ?  ?1.  Kidney cancer diagnosed in December 2021.  She was found to have T3a N1 disease at that time. ? ?The natural course of this disease was reviewed and treatment choices were discussed.  She is currently on active surveillance and any additional treatment will be deferred unless she has systemic disease detected in the future.  Salvage therapy options including oral targeted therapy, immunotherapy or combination of the above. ? ?The plan is to update her staging scan in August of 2023. ?  ?2.  IV access: Risks and benefits of Port-A-Cath removal were discussed at this time.  She opted to keep it for the time being. ?  ? ? 3.  Immune mediated complications: No additional complications noted other than dermatitis. ? ? ?4.  Abnormal gum tissue: Likely related to immunotherapy and autoimmune complications.  This continues to improve currently. ? ?5.  Hypothyroidism: Her TSH in January 2023 is nearly normalized continues to be on Synthroid replacement. ? ?6.  Fungal infection and dermatitis: Resolved at this time. ? ?7.  Follow-up: 4 months for repeat follow-up. ?  ?30  minutes were dedicated to this encounter.  Time spent on reviewing laboratory data,  disease status update, treatment choices and addressing complication related to cancer and cancer therapy. ? ? ?Zola Button, MD ?3/31/20232:45 PM  ? ? ?

## 2021-05-13 NOTE — Telephone Encounter (Signed)
-----   Message from Leeroy Bock, [place] sent at 05/13/2021 11:50 AM EDT ----- ?Could change simvastatin '40mg'$  daily to rosuvastatin '40mg'$  daily for more effective lipid lowering. She already takes fenofibrate for her triglycerides and Vascepa is not cheap ($100/month on her plan), would emphasize limiting carbs/sugar in her diet - she should already be working on this with her DM. ?

## 2021-05-13 NOTE — Telephone Encounter (Signed)
The patient has been notified of the result and verbalized understanding.  All questions (if any) were answered. ?Antonieta Iba, RN 05/13/2021 12:12 PM  ?Patient states that she will switch to rosuvastatin once she finishes her bottle of simvastatin. Advised on diet recommendations.  ?

## 2021-05-16 LAB — TSH: TSH: 6.503 u[IU]/mL — ABNORMAL HIGH (ref 0.308–3.960)

## 2021-05-17 ENCOUNTER — Other Ambulatory Visit: Payer: Self-pay | Admitting: Cardiology

## 2021-06-01 ENCOUNTER — Ambulatory Visit: Payer: Medicare Other

## 2021-06-01 DIAGNOSIS — Z5181 Encounter for therapeutic drug level monitoring: Secondary | ICD-10-CM

## 2021-06-01 DIAGNOSIS — I480 Paroxysmal atrial fibrillation: Secondary | ICD-10-CM

## 2021-06-01 DIAGNOSIS — Z7189 Other specified counseling: Secondary | ICD-10-CM

## 2021-06-01 DIAGNOSIS — I4819 Other persistent atrial fibrillation: Secondary | ICD-10-CM

## 2021-06-01 LAB — POCT INR: INR: 4.9 — AB (ref 2.0–3.0)

## 2021-06-01 NOTE — Patient Instructions (Signed)
Description   ?Hold warfarin today, then START taking Warfarin 1 tablet daily except for 1/2 a tablet on Sundays, Tuesdays and Thursdays.  ?Recheck INR in 2 week.  ?Stay consistent with greens (2 servings per week).  ?Call our office if you have any questions or if placed on any new medications [phone]. ?  ?   ?

## 2021-06-13 ENCOUNTER — Ambulatory Visit (HOSPITAL_BASED_OUTPATIENT_CLINIC_OR_DEPARTMENT_OTHER): Payer: Medicare Other | Admitting: Family

## 2021-06-16 ENCOUNTER — Ambulatory Visit: Payer: Medicare Other

## 2021-06-16 DIAGNOSIS — I480 Paroxysmal atrial fibrillation: Secondary | ICD-10-CM | POA: Diagnosis not present

## 2021-06-16 DIAGNOSIS — Z7189 Other specified counseling: Secondary | ICD-10-CM

## 2021-06-16 DIAGNOSIS — I4819 Other persistent atrial fibrillation: Secondary | ICD-10-CM

## 2021-06-16 DIAGNOSIS — Z5181 Encounter for therapeutic drug level monitoring: Secondary | ICD-10-CM | POA: Diagnosis not present

## 2021-06-16 LAB — POCT INR: INR: 2.1 (ref 2.0–3.0)

## 2021-06-16 NOTE — Patient Instructions (Signed)
Description   ?Continue taking Warfarin 1 tablet daily except for 1/2 a tablet on Sundays, Tuesdays and Thursdays.  ?Recheck INR in 3 week.  ?Stay consistent with greens (2 servings per week).  ?Call our office if you have any questions or if placed on any new medications [phone]. ?  ?   ?

## 2021-06-21 ENCOUNTER — Telehealth: Payer: Self-pay | Admitting: Oncology

## 2021-06-21 NOTE — Telephone Encounter (Signed)
Called patient regarding upcoming appointment, left a voicemail. 

## 2021-06-28 ENCOUNTER — Ambulatory Visit: Payer: Medicare Other | Admitting: Cardiology

## 2021-07-03 ENCOUNTER — Other Ambulatory Visit: Payer: Self-pay | Admitting: Cardiology

## 2021-07-07 ENCOUNTER — Ambulatory Visit: Payer: Medicare Other | Admitting: *Deleted

## 2021-07-07 DIAGNOSIS — I4819 Other persistent atrial fibrillation: Secondary | ICD-10-CM

## 2021-07-07 DIAGNOSIS — Z5181 Encounter for therapeutic drug level monitoring: Secondary | ICD-10-CM

## 2021-07-07 DIAGNOSIS — Z7189 Other specified counseling: Secondary | ICD-10-CM

## 2021-07-07 DIAGNOSIS — I480 Paroxysmal atrial fibrillation: Secondary | ICD-10-CM | POA: Diagnosis not present

## 2021-07-07 LAB — POCT INR: INR: 2.6 (ref 2.0–3.0)

## 2021-07-07 NOTE — Patient Instructions (Signed)
Description   Continue taking Warfarin 1 tablet daily except for 1/2 tablet on Sundays, Tuesdays and Thursdays. Recheck INR in 4 weeks. Stay consistent with greens (2 servings per week).  Call our office if you have any questions or if placed on any new medications [phone].

## 2021-07-08 ENCOUNTER — Other Ambulatory Visit: Payer: Self-pay | Admitting: Cardiology

## 2021-07-08 DIAGNOSIS — I4510 Unspecified right bundle-branch block: Secondary | ICD-10-CM

## 2021-07-08 DIAGNOSIS — E7800 Pure hypercholesterolemia, unspecified: Secondary | ICD-10-CM

## 2021-07-08 DIAGNOSIS — I4819 Other persistent atrial fibrillation: Secondary | ICD-10-CM

## 2021-07-08 DIAGNOSIS — I10 Essential (primary) hypertension: Secondary | ICD-10-CM

## 2021-07-12 ENCOUNTER — Other Ambulatory Visit: Payer: Self-pay

## 2021-07-12 ENCOUNTER — Inpatient Hospital Stay: Payer: Medicare Other

## 2021-07-12 ENCOUNTER — Inpatient Hospital Stay: Payer: Medicare Other | Attending: Oncology

## 2021-07-12 DIAGNOSIS — E039 Hypothyroidism, unspecified: Secondary | ICD-10-CM | POA: Diagnosis not present

## 2021-07-12 DIAGNOSIS — Z85528 Personal history of other malignant neoplasm of kidney: Secondary | ICD-10-CM | POA: Diagnosis not present

## 2021-07-12 DIAGNOSIS — D49511 Neoplasm of unspecified behavior of right kidney: Secondary | ICD-10-CM

## 2021-07-12 DIAGNOSIS — Z95828 Presence of other vascular implants and grafts: Secondary | ICD-10-CM

## 2021-07-12 LAB — CBC WITH DIFFERENTIAL (CANCER CENTER ONLY)
Abs Immature Granulocytes: 0.07 10*3/uL (ref 0.00–0.07)
Basophils Absolute: 0.1 10*3/uL (ref 0.0–0.1)
Basophils Relative: 1 %
Eosinophils Absolute: 0.1 10*3/uL (ref 0.0–0.5)
Eosinophils Relative: 2 %
HCT: 31.8 % — ABNORMAL LOW (ref 36.0–46.0)
Hemoglobin: 10.6 g/dL — ABNORMAL LOW (ref 12.0–15.0)
Immature Granulocytes: 1 %
Lymphocytes Relative: 30 %
Lymphs Abs: 2 10*3/uL (ref 0.7–4.0)
MCH: 28.6 pg (ref 26.0–34.0)
MCHC: 33.3 g/dL (ref 30.0–36.0)
MCV: 85.9 fL (ref 80.0–100.0)
Monocytes Absolute: 0.6 10*3/uL (ref 0.1–1.0)
Monocytes Relative: 8 %
Neutro Abs: 3.8 10*3/uL (ref 1.7–7.7)
Neutrophils Relative %: 58 %
Platelet Count: 270 10*3/uL (ref 150–400)
RBC: 3.7 MIL/uL — ABNORMAL LOW (ref 3.87–5.11)
RDW: 13.6 % (ref 11.5–15.5)
WBC Count: 6.6 10*3/uL (ref 4.0–10.5)
nRBC: 0 % (ref 0.0–0.2)

## 2021-07-12 LAB — CMP (CANCER CENTER ONLY)
ALT: 10 U/L (ref 0–44)
AST: 10 U/L — ABNORMAL LOW (ref 15–41)
Albumin: 3.6 g/dL (ref 3.5–5.0)
Alkaline Phosphatase: 46 U/L (ref 38–126)
Anion gap: 6 (ref 5–15)
BUN: 41 mg/dL — ABNORMAL HIGH (ref 8–23)
CO2: 28 mmol/L (ref 22–32)
Calcium: 9.8 mg/dL (ref 8.9–10.3)
Chloride: 104 mmol/L (ref 98–111)
Creatinine: 0.93 mg/dL (ref 0.44–1.00)
GFR, Estimated: >60 mL/min (ref >60)
Glucose, Bld: 231 mg/dL — ABNORMAL HIGH (ref 70–99)
Potassium: 4.2 mmol/L (ref 3.5–5.1)
Sodium: 138 mmol/L (ref 135–145)
Total Bilirubin: 0.3 mg/dL (ref 0.3–1.2)
Total Protein: 6.3 g/dL — ABNORMAL LOW (ref 6.5–8.1)

## 2021-07-12 LAB — TSH: TSH: 6.733 u[IU]/mL — ABNORMAL HIGH (ref 0.350–4.500)

## 2021-07-12 MED ORDER — SODIUM CHLORIDE 0.9% FLUSH
10.0000 mL | Freq: Once | INTRAVENOUS | Status: AC
  Administered 2021-07-12: 10 mL

## 2021-07-12 NOTE — Progress Notes (Unsigned)
Cardiology Office Note    Date:  07/13/2021   ID:  Latasha Hodges, DOB [birth year], MRN [id number]  PCP:  Kelton Pillar, MD  Cardiologist:  Fransico Him, MD  Electrophysiologist:  None   Chief Complaint: lower extremity edema  History of Present Illness:   Latasha Hodges is a 76 y.o. female with history of persistent atrial fibrillation, RBBB, DM, HTN, HLD, spinal stenosis, kidney CA s/p right nephrectomy and chemotherapy, CKD stage 2 by labs, anemia, coronary calcification seen on CT who is seen for evaluation of lower extremity edema. She has followed with Dr. [name] for many years for history of atrial fib, managed with flecainide and warfarin. DOACs were previously too expensive per patient. Stress test in 2019 was normal. A non-cardiac CT in 02/2021 incidentally picked up coronary calcification, so a calcium score was pursued 04/2021 showing CAC of 47 (54%ile), also showed benign appearing pulm nodule, aortic atherosclerosis and emphysema. Dr. [name] recommended to switch simvastatin to rosuvastatin with f/u labs. She has not had a prior echo on file.   She returns for follow-up today for evaluation of lower extremity edema for over a month. This started shortly before going on a cruise in April. She has chronic dyspnea with higher levels of activity but reports this is unchanged. She had not had any recent chest pain. She declined to be weighed today but believes her home weight to be around 215. She used to follow low sodium diet after kidney surgery but let that lapse and now gets a lot of Lean Cuisines in her diet. She drinks a lot of water because of her prior kidney issues. She also reports urinating a lot, sometimes with concern of incontinence. This is not a new symptom. She did not yet start her rosuvastatin, citing that coumadin clinic told her not to make the switch because she was going out of town and it could affect her INR. She also reports only taking flecainide once a  day because she usually forgets the PM dose. She thankfully has not had any problems with breakthrough persisting afib in years. She lives alone and is unsure if she snores, but does have daytime fatigue.   Labwork independently reviewed: 06/2021 Hgb 10.6 c/w prior, plt 270, TSH 6.733, K 4.2, BUN 41, Cr 0.93, AST/ALT not elevated,  04/2021 TSH 6.5, LDL 69, trig 215 01/2021 A1c 7.2  Cardiology Studies:   Studies reviewed are outlined and summarized above. Reports included below if pertinent.   Calcium Score 04/2021 EXAM: Coronary Calcium Score   TECHNIQUE: A gated, non-contrast computed tomography scan of the heart was performed using 66m slice thickness. Axial images were analyzed on a dedicated workstation. Calcium scoring of the coronary arteries was performed using the Agatston method.   FINDINGS: Coronary arteries: Normal origins.   Coronary Calcium Score:   Left main: 5.44   Left anterior descending artery: 10.3   Left circumflex artery: 31.9   Right coronary artery: 0   Total: 47.7   Percentile: 54   Pericardium: Normal.   Ascending Aorta: Normal caliber, with mild proximal aorta calcification.   Non-cardiac: [facility].   IMPRESSION: Coronary calcium score of 47. This was 535percentile for age-, race-, and sex-matched controls.   RECOMMENDATIONS: Coronary artery calcium (CAC) score is a strong predictor of incident coronary heart disease (CHD) and provides predictive information beyond traditional risk factors. CAC scoring is reasonable to use in the decision to withhold, postpone, or initiate statin therapy in  intermediate-risk or selected borderline-risk asymptomatic adults (age 73-75 years and LDL-C >=70 to <190 mg/dL) who do not have diabetes or established atherosclerotic cardiovascular disease (ASCVD).* In intermediate-risk (10-year ASCVD risk >=7.5% to <20%) adults or selected borderline-risk (10-year ASCVD risk  >=5% to <7.5%) adults in whom a CAC score is measured for the purpose of making a treatment decision the following recommendations have been made:   If CAC=0, it is reasonable to withhold statin therapy and reassess in 5 to 10 years, as long as higher risk conditions are absent (diabetes mellitus, family history of premature CHD in first degree relatives (males <55 years; females <65 years), cigarette smoking, or LDL >=190 mg/dL).   If CAC is 1 to 99, it is reasonable to initiate statin therapy for patients >=61 years of age.   If CAC is >=100 or >=75th percentile, it is reasonable to initiate statin therapy at any age.   Cardiology referral should be considered for patients with CAC scores >=400 or >=75th percentile.   *2018 AHA/ACC/AACVPR/AAPA/ABC/ACPM/ADA/AGS/APhA/ASPC/NLA/PCNA Guideline on the Management of Blood Cholesterol: A Report of the American College of Cardiology/American Heart Association Task Force on Clinical Practice Guidelines. J Am Coll Cardiol. 2019;73(24):3168-3209.   Berniece Salines, DO [facility]     Electronically Signed   By: Berniece Salines D.O.   On: 04/28/2021 20:16  IMPRESSION: 1.  Emphysema (ICD10-J43.9). 2.  Aortic Atherosclerosis (ICD10-I70.0). 3. Unchanged benign-appearing 4 mm right lower lobe solid pulmonary nodule.   Ruthann Cancer, MD   [facility]   [facility]   Electronically Signed: By: Ruthann Cancer M.D. On: 04/28/2021 15:21  Monitor 2019 Sinus bradycardia, normal sinus rhythm and sinus tachycardia. The average heart rate was 73bpm with heart rate range form 50 to 118bpm.    NST 2019 The left ventricular ejection fraction is normal (55-65%). Nuclear stress EF: 61%. There was no ST segment deviation noted during stress. The study is normal. This is a low risk study.   Normal resting and stress perfusion. No ischemia or infarction EF 61%      Past Medical History:  Diagnosis Date    Acquired solitary kidney    left side;   s/p right nephrectomy 12/ 2021   Agatston coronary artery calcium score less than 100    Coronary calcium score was 27 which is low at 54th percentile for age and sex matched controls.   Anticoagulated on Coumadin    long-term;  managed by cardiology   Aortic atherosclerosis ([place])    Arthritis    Chronic kidney disease (CKD), stage III (moderate) (HCC)    Hyperlipidemia, mixed    Hypertension    Left wrist fracture    Malignant neoplasm of kidney, right ([place]) 01/2020   oncologist--- dr Alen Blew;  dx 12/ 2021  Stage IIIa,  Clear cell type;  01-29-2020  s/p robotic right radical nephrectomy (one posisitve node);  chemo 03-31-2020 to  08-11-2020   Persistent atrial fibrillation [facility])    cardiologist--- dr t. turner;  nuclear sress test-- 08-15-2017 low risk normal perfusion, nuclear ef 61%;  event monitor 12-18-2017 in epic   RBBB (right bundle branch block)    chronic   Type 2 diabetes mellitus ([place])     Past Surgical History:  Procedure Laterality Date   COLONOSCOPY  12/29/2010   Procedure: COLONOSCOPY;  Surgeon: Owens Loffler, MD;  Location: [facility];  Service: Endoscopy;  Laterality: N/A;   HARDWARE REMOVAL Left 05/02/2021   Procedure: left wrist removal of hardware-  dorsal spanning plate;  Surgeon: [name], MD;  Location[facility];  Service: Orthopedics;  Laterality: Left;   IR IMAGING GUIDED PORT INSERTION  07/08/2020   LUMBAR LAMINECTOMY/DECOMPRESSION MICRODISCECTOMY Left 07/14/2020   Procedure: Laminectomy and Foraminotomy - Lumbar three-Lumbar four- Lumbar four-Lumbar five - left with sublaminar decompression;  Surgeon: Eustace Moore, MD;  Location: [place];  Service: Neurosurgery;  Laterality: Left;   ORIF WRIST FRACTURE Left 02/10/2021   Procedure: OPEN REDUCTION INTERNAL FIXATION (ORIF) WRIST FRACTURE;  Surgeon: [name], MD;  Location: [facility];  Service: Orthopedics;  Laterality: Left;  with MAC   ROBOT  ASSISTED LAPAROSCOPIC NEPHRECTOMY N/A 01/29/2020   Procedure: XI ROBOTIC ASSISTED LAPAROSCOPIC RADICAL NEPHRECTOMY;  Surgeon: Raynelle Bring, MD;  Location: [facility];  Service: Urology;  Laterality: N/A;   TUBAL LIGATION  1980    Current Medications: Current Meds  Medication Sig   clotrimazole-betamethasone (LOTRISONE) cream Apply 1 application topically 2 (two) times daily. May use with OTC zinc oxide cream.   Dulaglutide (TRULICITY) 1.5 VZ/5.6LO SOPN Inject 1.5 mg into the skin every Thursday.   fenofibrate micronized (LOFIBRA) 200 MG capsule TAKE 1 CAPSULE BY MOUTH DAILY BEFORE BREAKFAST   flecainide (TAMBOCOR) 50 MG tablet TAKE 1 TABLET(50 MG) BY MOUTH TWICE DAILY   levothyroxine (SYNTHROID) 75 MCG tablet Take 75 mcg by mouth every morning.   lisinopril (ZESTRIL) 20 MG tablet TAKE 1 TABLET(20 MG) BY MOUTH DAILY   magnesium gluconate (MAGONATE) 500 MG tablet Take 500 mg by mouth daily.   metFORMIN (GLUCOPHAGE) 1000 MG tablet Take 1,000 mg by mouth 2 (two) times daily.   metoprolol succinate (TOPROL-XL) 50 MG 24 hr tablet TAKE 1 TABLET(50 MG) BY MOUTH DAILY   rosuvastatin (CRESTOR) 40 MG tablet Take 1 tablet (40 mg total) by mouth daily.   warfarin (COUMADIN) 5 MG tablet TAKE 1 TABLET BY MOUTH AS DIRECTED BY COUMADIN CLINIC      Allergies:   Iodinated contrast media, No healthtouch food allergies, Penicillins, Sulfa antibiotics, and Theophyllines   Social History   Socioeconomic History   Marital status: Divorced    Spouse name: Not on file   Number of children: 2   Years of education: Not on file   Highest education level: Not on file  Occupational History   Occupation: ADMIN ASSISTANT  Tobacco Use   Smoking status: Former    Types: Cigarettes    Quit date: 12/28/1981    Years since quitting: 39.5   Smokeless tobacco: Never  Vaping Use   Vaping Use: Never used  Substance and Sexual Activity   Alcohol use: Yes    Comment: occas.   Drug use: No   Sexual activity: Never     Birth control/protection: Post-menopausal  Other Topics Concern   Not on file  Social History Narrative   0 caffeine drinks daily    Social Determinants of Health   Financial Resource Strain: Not on file  Food Insecurity: Not on file  Transportation Needs: Not on file  Physical Activity: Not on file  Stress: Not on file  Social Connections: Not on file     Family History:  The patient's family history includes Cancer in her mother; Colonic polyp in her cousin; Diabetes in her father; Hypertension in her father. There is no history of Colon cancer.  ROS:   Please see the history of present illness.  All other systems are reviewed and otherwise negative.    EKG(s)/Additional Labs   EKG:  EKG  is ordered today, personally reviewed, demonstrating NSR 64bpm with RBBB, known from prior. QTc 42m. No acute change from previous.  Recent Labs: 07/12/2021: ALT 10; BUN 41; Creatinine 0.93; Hemoglobin 10.6; Platelet Count 270; Potassium 4.2; Sodium 138; TSH 6.733  Recent Lipid Panel    Component Value Date/Time   CHOL 152 05/12/2021 0000   TRIG 215 (H) 05/12/2021 0000   HDL 48 05/12/2021 0000   CHOLHDL 3.2 05/12/2021 0000   CHOLHDL 5 08/12/2014 0805   LDLCALC 69 05/12/2021 0000   LDLDIRECT 69.0 08/12/2014 0805    PHYSICAL EXAM:    VS:  BP 130/66   Pulse 66   Ht _0  (1.626 m)   SpO2 96%   BMI 37.83 kg/m   BMI: Body mass index is 37.83 kg/m.  GEN: Well nourished, well developed female in no acute distress HEENT: normocephalic, atraumatic Neck: no JVD, carotid bruits, or masses Cardiac: RRR; no murmurs, rubs, or gallops, 2+ pitting BLE edema below the knee to feet Respiratory:  clear to auscultation bilaterally, normal work of breathing GI: soft, nontender, nondistended, + BS MS: no deformity or atrophy Skin: warm and dry, no rash Neuro:  Alert and Oriented x 3, Strength and sensation are intact, follows commands Psych: euthymic mood, full affect  Wt Readings from  Last 3 Encounters:  05/02/21 220 lb 6.4 oz (100 kg)  04/18/21 212 lb 12.8 oz (96.5 kg)  03/09/21 210 lb 6.4 oz (95.4 kg)     ASSESSMENT & PLAN:   1. Lower extremity edema - differential diagnosis includes fluid retention from excess sodium / fluid intake, lymphatic compression from distribution of obesity, and congestive heart failure. Unclear to what degree her thyroid disease could be contributing. Her TSH was still elevated yesterday though not nearly what it was several months ago. Albumin was normal by labs yesterday. Interestingly BUN has been persistently elevated on her labwork long term. I have recommended we start with an echocardiogram along with sodium and fluid restriction. Also discussed compression, elevation. I would like to trial a short course of Lasix as I'm not sure she will tolerate a standing dose given her historical frequent urination. However, she has a history of low Mg level so will check first before prescribing. Will also get free T4 and BNP with labs. DVT seems much less likely given bilateral distribution and chronic anticoagulation with warfarin - all of her recent INRs have either been therapeutic or supratherapeutic. I am wondering if we may see some right heart dysfunction on her echocardiogram in which case I would recommend sleep study. She would like to wait for the echo result before pursuing this.  2. Persistent atrial fibrillation - maintaining NSR. At this juncture only taking flecainide once daily. I'll reach out to Dr. TRadford Paxwith update on recent edema and elevated calcium score to inquire her thoughts on continuation of flecainide at this time. Continue metoprolol. She is on warfarin due to cost of DOACs.  ADDENDUM 07/14/2021: [place] fle[place] but await echo. If EF is down, would pursue more definitive ischemic eval with cath, but if EF normal, would plan for Lexiscan nuclear stress test.  3. Coronary calcification with  HLD goal LDL <70 - has chronic dyspnea but no recent angina. She had a low risk nuc in 2019. Follow up echocardiogram results as above. She is not on ASA due to concomitant warfarin. She will now be making the switch from simvastatin to rosuvastatin. I asked TDarden Dates CMA,  to notify the Coumadin clinic in case they feel they need to follow up INR closer than current interval with this change. We will reassess how she is doing in follow-up and decide on timing of repeat LFT/lipids at that time.    4. Essential HTN - BP top normal today. Await labs for further med changes.  5. Known RBBB - chronic, follow up echo as above.  6. Abnormal TSH - check fT4 today.     Disposition: F/u with me after echo.   Medication Adjustments/Labs and Tests Ordered: Current medicines are reviewed at length with the patient today.  Concerns regarding medicines are outlined above. Medication changes, Labs and Tests ordered today are summarized above and listed in the Patient Instructions accessible in Encounters.   Signed, Charlie Pitter, PA-C  07/13/2021 3:58 PM    [place] Phone: [phone]; Fax: [phone]

## 2021-07-13 ENCOUNTER — Ambulatory Visit: Payer: Medicare Other | Admitting: Physician Assistant

## 2021-07-13 ENCOUNTER — Encounter: Payer: Self-pay | Admitting: Physician Assistant

## 2021-07-13 VITALS — BP 130/66 | HR 66 | Ht 64.0 in

## 2021-07-13 DIAGNOSIS — I4510 Unspecified right bundle-branch block: Secondary | ICD-10-CM | POA: Diagnosis not present

## 2021-07-13 DIAGNOSIS — E785 Hyperlipidemia, unspecified: Secondary | ICD-10-CM

## 2021-07-13 DIAGNOSIS — I2510 Atherosclerotic heart disease of native coronary artery without angina pectoris: Secondary | ICD-10-CM

## 2021-07-13 DIAGNOSIS — R600 Localized edema: Secondary | ICD-10-CM | POA: Diagnosis not present

## 2021-07-13 DIAGNOSIS — I10 Essential (primary) hypertension: Secondary | ICD-10-CM | POA: Diagnosis not present

## 2021-07-13 DIAGNOSIS — I4819 Other persistent atrial fibrillation: Secondary | ICD-10-CM | POA: Diagnosis not present

## 2021-07-13 DIAGNOSIS — R7989 Other specified abnormal findings of blood chemistry: Secondary | ICD-10-CM | POA: Diagnosis not present

## 2021-07-13 NOTE — Patient Instructions (Signed)
Medication Instructions:  Your physician recommends that you continue on your current medications as directed. Please refer to the Current Medication list given to you today. *If you need a refill on your cardiac medications before your next appointment, please call your pharmacy*   Lab Work: TODAY-MAG, FREE T4 & BNP If you have labs (blood work) drawn today and your tests are completely normal, you will receive your results only by: MyChart Message (if you have MyChart) OR A paper copy in the mail If you have any lab test that is abnormal or we need to change your treatment, we will call you to review the results.   Testing/Procedures: Your physician has requested that you have an echocardiogram. Echocardiography is a painless test that uses sound waves to create images of your heart. It provides your doctor with information about the size and shape of your heart and how well your heart's chambers and valves are working. This procedure takes approximately one hour. There are no restrictions for this procedure.   Follow-Up: At [facility], you and your health needs are our priority.  As part of our continuing mission to provide you with exceptional heart care, we have created designated Provider Care Teams.  These Care Teams include your primary Cardiologist (physician) and Advanced Practice Providers (APPs -  Physician Assistants and Nurse Practitioners) who all work together to provide you with the care you need, when you need it.  We recommend signing up for the patient portal called "MyChart".  Sign up information is provided on this After Visit Summary.  MyChart is used to connect with patients for Virtual Visits (Telemedicine).  Patients are able to view lab/test results, encounter notes, upcoming appointments, etc.  Non-urgent messages can be sent to your provider as well.   To learn more about what you can do with MyChart, go to [email or web address].    Your next appointment:    AFTER ECHO     The format for your next appointment:   In Person  Provider:   Melina Copa, PA-C    Other Instructions   Important Information About Sugar

## 2021-07-14 LAB — PRO B NATRIURETIC PEPTIDE: NT-Pro BNP: 98 pg/mL (ref 0–301)

## 2021-07-14 LAB — MAGNESIUM: Magnesium: 1.9 mg/dL (ref 1.6–2.3)

## 2021-07-14 LAB — T4, FREE: Free T4: 1.67 ng/dL (ref 0.82–1.77)

## 2021-07-15 ENCOUNTER — Other Ambulatory Visit: Payer: Self-pay

## 2021-07-15 ENCOUNTER — Other Ambulatory Visit: Payer: Medicare Other

## 2021-07-15 DIAGNOSIS — R600 Localized edema: Secondary | ICD-10-CM

## 2021-07-15 DIAGNOSIS — I4510 Unspecified right bundle-branch block: Secondary | ICD-10-CM

## 2021-07-15 DIAGNOSIS — I4819 Other persistent atrial fibrillation: Secondary | ICD-10-CM

## 2021-07-15 MED ORDER — FUROSEMIDE 20 MG PO TABS: 20.0000 mg | ORAL_TABLET | Freq: Every day | ORAL | 2 refills | Status: DC | PRN

## 2021-07-21 ENCOUNTER — Other Ambulatory Visit: Payer: Self-pay | Admitting: *Deleted

## 2021-07-21 ENCOUNTER — Other Ambulatory Visit: Payer: Medicare Other | Admitting: *Deleted

## 2021-07-21 DIAGNOSIS — R600 Localized edema: Secondary | ICD-10-CM

## 2021-07-21 DIAGNOSIS — I4819 Other persistent atrial fibrillation: Secondary | ICD-10-CM | POA: Diagnosis not present

## 2021-07-21 DIAGNOSIS — I4510 Unspecified right bundle-branch block: Secondary | ICD-10-CM | POA: Diagnosis not present

## 2021-07-22 ENCOUNTER — Telehealth: Payer: Self-pay | Admitting: *Deleted

## 2021-07-22 DIAGNOSIS — R600 Localized edema: Secondary | ICD-10-CM

## 2021-07-22 DIAGNOSIS — R7989 Other specified abnormal findings of blood chemistry: Secondary | ICD-10-CM

## 2021-07-22 LAB — BASIC METABOLIC PANEL
BUN/Creatinine Ratio: 39 — ABNORMAL HIGH (ref 12–28)
BUN: 55 mg/dL — ABNORMAL HIGH (ref 8–27)
CO2: 27 mmol/L (ref 20–29)
Calcium: 10.1 mg/dL (ref 8.7–10.3)
Chloride: 95 mmol/L — ABNORMAL LOW (ref 96–106)
Creatinine, Ser: 1.42 mg/dL — ABNORMAL HIGH (ref 0.57–1.00)
Glucose: 230 mg/dL — ABNORMAL HIGH (ref 70–99)
Potassium: 4.4 mmol/L (ref 3.5–5.2)
Sodium: 136 mmol/L (ref 134–144)
eGFR: 39 mL/min/{1.73_m2} — ABNORMAL LOW (ref >59)

## 2021-07-22 LAB — MAGNESIUM: Magnesium: 2.1 mg/dL (ref 1.6–2.3)

## 2021-07-22 NOTE — Telephone Encounter (Signed)
Pt returning call. Transferred to RN, Leodis Liverpool.

## 2021-07-22 NOTE — Telephone Encounter (Signed)
Patient notified.  She states her magnesium dose is 400 mg. Will update med list. She will decrease to once daily. She will come in for lab work on 08/01/21

## 2021-07-22 NOTE — Telephone Encounter (Signed)
-----   Message from Loel Dubonnet, NP sent at 07/22/2021  3:22 PM EDT ----- Normal magnesium, potassium. Kidney function decreased compared to previous. Please stop Furosemide (Lasix).  As we are stopping Lasix can reduce magnesium back to once per day.  Reduce Lisinopril to half tablet daily for 2 days then return to one tablet daily.  Ensure following low-sodium diet, elevating lower extremities.  Repeat BMP in 1 week.  Please call patient to make her aware.

## 2021-07-22 NOTE — Telephone Encounter (Signed)
Left message to call office

## 2021-07-26 DIAGNOSIS — H524 Presbyopia: Secondary | ICD-10-CM | POA: Diagnosis not present

## 2021-07-26 DIAGNOSIS — E119 Type 2 diabetes mellitus without complications: Secondary | ICD-10-CM | POA: Diagnosis not present

## 2021-07-26 DIAGNOSIS — H5203 Hypermetropia, bilateral: Secondary | ICD-10-CM | POA: Diagnosis not present

## 2021-07-26 DIAGNOSIS — H25813 Combined forms of age-related cataract, bilateral: Secondary | ICD-10-CM | POA: Diagnosis not present

## 2021-07-29 ENCOUNTER — Other Ambulatory Visit: Payer: Medicare Other | Admitting: *Deleted

## 2021-07-29 DIAGNOSIS — R600 Localized edema: Secondary | ICD-10-CM | POA: Diagnosis not present

## 2021-07-29 DIAGNOSIS — R7989 Other specified abnormal findings of blood chemistry: Secondary | ICD-10-CM | POA: Diagnosis not present

## 2021-07-30 LAB — BASIC METABOLIC PANEL
BUN/Creatinine Ratio: 44 — ABNORMAL HIGH (ref 12–28)
BUN: 42 mg/dL — ABNORMAL HIGH (ref 8–27)
CO2: 22 mmol/L (ref 20–29)
Calcium: 9.7 mg/dL (ref 8.7–10.3)
Chloride: 101 mmol/L (ref 96–106)
Creatinine, Ser: 0.95 mg/dL (ref 0.57–1.00)
Glucose: 232 mg/dL — ABNORMAL HIGH (ref 70–99)
Potassium: 4.4 mmol/L (ref 3.5–5.2)
Sodium: 136 mmol/L (ref 134–144)
eGFR: 63 mL/min/{1.73_m2} (ref >59)

## 2021-08-04 ENCOUNTER — Ambulatory Visit: Payer: Medicare Other

## 2021-08-04 DIAGNOSIS — I4819 Other persistent atrial fibrillation: Secondary | ICD-10-CM | POA: Diagnosis not present

## 2021-08-04 DIAGNOSIS — Z5181 Encounter for therapeutic drug level monitoring: Secondary | ICD-10-CM | POA: Diagnosis not present

## 2021-08-04 LAB — POCT INR: INR: 4.3 — AB (ref 2.0–3.0)

## 2021-08-04 NOTE — Patient Instructions (Signed)
Description   Hold today's dose and eat a serving of greens and then continue taking Warfarin 1 tablet daily except for 1/2 tablet on Sundays, Tuesdays and Thursdays.  Recheck INR in 3 weeks.  Stay consistent with greens (2-3 servings per week).  Call our office if you have any questions or if placed on any new medications [phone].

## 2021-08-05 ENCOUNTER — Ambulatory Visit (HOSPITAL_COMMUNITY): Payer: Medicare Other | Attending: Cardiology

## 2021-08-05 DIAGNOSIS — E785 Hyperlipidemia, unspecified: Secondary | ICD-10-CM | POA: Insufficient documentation

## 2021-08-05 DIAGNOSIS — I4510 Unspecified right bundle-branch block: Secondary | ICD-10-CM | POA: Diagnosis not present

## 2021-08-05 DIAGNOSIS — R600 Localized edema: Secondary | ICD-10-CM | POA: Diagnosis not present

## 2021-08-05 DIAGNOSIS — R7989 Other specified abnormal findings of blood chemistry: Secondary | ICD-10-CM | POA: Diagnosis not present

## 2021-08-05 DIAGNOSIS — I10 Essential (primary) hypertension: Secondary | ICD-10-CM | POA: Insufficient documentation

## 2021-08-05 DIAGNOSIS — I2510 Atherosclerotic heart disease of native coronary artery without angina pectoris: Secondary | ICD-10-CM | POA: Insufficient documentation

## 2021-08-05 DIAGNOSIS — I4819 Other persistent atrial fibrillation: Secondary | ICD-10-CM | POA: Diagnosis not present

## 2021-08-05 LAB — ECHOCARDIOGRAM COMPLETE
Area-P 1/2: 2.85 cm2
S' Lateral: 3.1 cm

## 2021-08-08 ENCOUNTER — Telehealth: Payer: Self-pay | Admitting: Physician Assistant

## 2021-08-08 DIAGNOSIS — Z5181 Encounter for therapeutic drug level monitoring: Secondary | ICD-10-CM

## 2021-08-08 DIAGNOSIS — I2510 Atherosclerotic heart disease of native coronary artery without angina pectoris: Secondary | ICD-10-CM

## 2021-08-08 NOTE — Telephone Encounter (Signed)
   Echo results came in - normal EF, g1DD, mildly dilated RV mild AI, no acute concerning findings. I called patient to discuss with her. Per my previous  review with Dr. Mayford Knife she had recommended that if EF were normal by echo, would recommend Lexiscan nuclear stress test. I reviewed with patient and she is agreeable Shared Decision Making/Informed Consent The risks [chest pain, shortness of breath, cardiac arrhythmias, dizziness, blood pressure fluctuations, myocardial infarction, stroke/transient ischemic attack, nausea, vomiting, allergic reaction, radiation exposure, metallic taste sensation and life-threatening complications (estimated to be 1 in 10,000)], benefits (risk stratification, diagnosing coronary artery disease, treatment guidance) and alternatives of a nuclear stress test were discussed in detail with Ms. Karen and she agrees to proceed. Triage, please help schedule patient for Lexiscan nuclear stress test. I put in [id number] order. Otherwise she knows to keep f/u with me in July.  It was also surprising to see that with such a low dose of Lasix that her kidney function bumped, requiring [place] to back down on it. It is noted that her BUN appears persistently elevated since her kidney surgery, and has been increasing in recent months, coinciding with her reports of increased swelling. She reports improvement in her swelling some but I'd like her to follow up with urology to discuss whether there is significance with rising BUN and her symptoms. She asked if our office could place the call to Dr. Vevelyn Royals office to explain this concern. Please relay this info and recent labs with their office.   As always, thank you for all your help.  Merrilyn Legler PA-C

## 2021-08-22 ENCOUNTER — Encounter: Payer: Self-pay | Admitting: Physician Assistant

## 2021-08-22 NOTE — Progress Notes (Deleted)
Cardiology Office Note    Date:  08/22/2021   ID:  Nur Rabold, DOB Dec 13, 1945, MRN [id number]  PCP:  Kelton Pillar, MD  Cardiologist:  Fransico Him, MD  Electrophysiologist:  None   Chief Complaint: ***  History of Present Illness:   Latasha Hodges is a 76 y.o. female with history of persistent atrial fibrillation, RBBB, DM, HTN, HLD, spinal stenosis, kidney CA s/p right nephrectomy and chemotherapy, CKD stage 2 by labs, mild AI, anemia, coronary calcification seen on CT who is seen for follow-up of lower extremity edema.   She has followed with Dr. [name] for many years for history of atrial fib, managed with flecainide and warfarin. DOACs were previously too expensive per patient. Stress test in 2019 was normal. A non-cardiac CT in 02/2021 incidentally picked up coronary calcification, so a calcium score was pursued 04/2021 showing CAC of 47 (54%ile), also showed benign appearing pulm nodule, aortic atherosclerosis and emphysema. Dr. [name] recommended to switch simvastatin to rosuvastatin with f/u labs. She was seen for evaluation of worsening lower extremity edema 06/2021 that started shortly before going on a cruise. She reported urinating a lot, sometimes with concern of incontinence, though this was not a new symptom. She was only taking flecainide once a day due to forgetting PM dose but had not had any issues with breakthrough AFib in years. Labs showed normal BNP. Trial of low dose Lasix was added, though even with addition of low dose she bumped her Cr to 1.42 so Lasix was stopped and lisinopril was transiently held. Repeat Cr was 0.95. Mag gluconate was increased. 2D echo 08/05/21 showed EF 60-65%, G1DD, mild LAE, mild AI. I had reached out to Dr. [name] regarding updated ischemic testing given flecainide + coronary calcification and we arranged Lexiscan nuc, pending later this month, based on her recommendation.   F/u liver/lipids plan - cmet/lipid profile direct LDL Pulm  nodule pcp  Declined to be weighed  Mag gluconnate BID  Lower extremity edema CKD stage II with persistently elevated BUN Persistent atrial fibrillation Mild aortic insufficiency Essential HTN     Labwork independently reviewed: 07/29/21 K 4.4, BUN 42, Cr 0.95 07/21/21 K 4.4, BUN 55, Cr 1.42, Mg 2.1 07/13/21, Mg 1.9, pBNP 98, FT4 wnl   Cardiology Studies:   Studies reviewed are outlined and summarized above. Reports included below if pertinent.   Nuc pending  2D echo 08/05/21    1. Left ventricular ejection fraction, by estimation, is 60 to 65%. The  left ventricle has normal function. The left ventricle has no regional  wall motion abnormalities. Left ventricular diastolic parameters are  consistent with Grade I diastolic  dysfunction (impaired relaxation).   2. Right ventricular systolic function is normal. The right ventricular  size is mildly enlarged.   3. Left atrial size was mildly dilated.   4. The mitral valve is normal in structure. Trivial mitral valve  regurgitation. No evidence of mitral stenosis.   5. The aortic valve is tricuspid. There is moderate calcification of the  aortic valve. Aortic valve regurgitation is mild. Aortic valve  sclerosis/calcification is present, without any evidence of aortic  stenosis.   6. The inferior vena cava is normal in size with greater than 50%  respiratory variability, suggesting right atrial pressure of 3 mmHg.   Calcium Score 04/2021 EXAM: Coronary Calcium Score   TECHNIQUE: A gated, non-contrast computed tomography scan of the heart was performed using 70m slice thickness. Axial images were analyzed on a dedicated workstation. Calcium  scoring of the coronary arteries was performed using the Agatston method.   FINDINGS: Coronary arteries: Normal origins.   Coronary Calcium Score:   Left main: 5.44   Left anterior descending artery: 10.3   Left circumflex artery: 31.9   Right coronary artery: 0   Total:  47.7   Percentile: 54   Pericardium: Normal.   Ascending Aorta: Normal caliber, with mild proximal aorta calcification.   Non-cardiac: See separate report from [facility].   IMPRESSION: Coronary calcium score of 47. This was 46 percentile for age-, race-, and sex-matched controls.   RECOMMENDATIONS: Coronary artery calcium (CAC) score is a strong predictor of incident coronary heart disease (CHD) and provides predictive information beyond traditional risk factors. CAC scoring is reasonable to use in the decision to withhold, postpone, or initiate statin therapy in intermediate-risk or selected borderline-risk asymptomatic adults (age 45-75 years and LDL-C >=70 to <190 mg/dL) who do not have diabetes or established atherosclerotic cardiovascular disease (ASCVD).* In intermediate-risk (10-year ASCVD risk >=7.5% to <20%) adults or selected borderline-risk (10-year ASCVD risk >=5% to <7.5%) adults in whom a CAC score is measured for the purpose of making a treatment decision the following recommendations have been made:   If CAC=0, it is reasonable to withhold statin therapy and reassess in 5 to 10 years, as long as higher risk conditions are absent (diabetes mellitus, family history of premature CHD in first degree relatives (males <55 years; females <65 years), cigarette smoking, or LDL >=190 mg/dL).   If CAC is 1 to 99, it is reasonable to initiate statin therapy for patients >=35 years of age.   If CAC is >=100 or >=75th percentile, it is reasonable to initiate statin therapy at any age.   Cardiology referral should be considered for patients with CAC scores >=400 or >=75th percentile.   *2018 AHA/ACC/AACVPR/AAPA/ABC/ACPM/ADA/AGS/APhA/ASPC/NLA/PCNA Guideline on the Management of Blood Cholesterol: A Report of the American College of Cardiology/American Heart Association Task Force on Clinical Practice Guidelines. J Am Coll Cardiol. 2019;73(24):3168-3209.    Latasha Salines, DO [facility]     Electronically Signed   By: Latasha Hodges D.O.   On: 04/28/2021 20:16   IMPRESSION: 1.  Emphysema (ICD10-J43.9). 2.  Aortic Atherosclerosis (ICD10-I70.0). 3. Unchanged benign-appearing 4 mm right lower lobe solid pulmonary nodule.   Latasha Cancer, MD   [facility] (Dr. [name] recommended to f/u PCP for the pulm nodule)   [facility]   Electronically Signed: By: Latasha Hodges M.D. On: 04/28/2021 15:21   Monitor 2019 Sinus bradycardia, normal sinus rhythm and sinus tachycardia. The average heart rate was 73bpm with heart rate range form 50 to 118bpm.     NST 2019 The left ventricular ejection fraction is normal (55-65%). Nuclear stress EF: 61%. There was no ST segment deviation noted during stress. The study is normal. This is a low risk study.   Normal resting and stress perfusion. No ischemia or infarction EF 61%    Past Medical History:  Diagnosis Date   Acquired solitary kidney    left side;   s/p right nephrectomy 12/ 2021   Agatston coronary artery calcium score less than 100    Coronary calcium score was 27 which is low at 54th percentile for age and sex matched controls.   Anticoagulated on Coumadin    long-term;  managed by cardiology   Aortic atherosclerosis (HCC)    Arthritis    Chronic kidney disease (CKD), stage III (moderate) (HCC)    Hyperlipidemia, mixed  Hypertension    Left wrist fracture    Malignant neoplasm of kidney, right [facility]) 01/2020   oncologist--- dr Alen Blew;  dx 12/ 2021  Stage IIIa,  Clear cell type;  01-29-2020  s/p robotic right radical nephrectomy (one posisitve node);  chemo 03-31-2020 to  08-11-2020   Persistent atrial fibrillation [facility])    cardiologist--- dr t. turner;  nuclear sress test-- 08-15-2017 low risk normal perfusion, nuclear ef 61%;  event monitor 12-18-2017 in epic   RBBB (right bundle branch block)    chronic   Type 2 diabetes mellitus [facility])      Past Surgical History:  Procedure Laterality Date   COLONOSCOPY  12/29/2010   Procedure: COLONOSCOPY;  Surgeon: Owens Loffler, MD;  Location: [facility];  Service: Endoscopy;  Laterality: N/A;   HARDWARE REMOVAL Left 05/02/2021   Procedure: left wrist removal of hardware- dorsal spanning plate;  Surgeon: [name], MD;  Location: [employer];  Service: Orthopedics;  Laterality: Left;   IR IMAGING GUIDED PORT INSERTION  07/08/2020   LUMBAR LAMINECTOMY/DECOMPRESSION MICRODISCECTOMY Left 07/14/2020   Procedure: Laminectomy and Foraminotomy - Lumbar three-Lumbar four- Lumbar four-Lumbar five - left with sublaminar decompression;  Surgeon: Eustace Moore, MD;  Location: [place];  Service: Neurosurgery;  Laterality: Left;   ORIF WRIST FRACTURE Left 02/10/2021   Procedure: OPEN REDUCTION INTERNAL FIXATION (ORIF) WRIST FRACTURE;  Surgeon: [name], MD;  Location: [facility];  Service: Orthopedics;  Laterality: Left;  with MAC   ROBOT ASSISTED LAPAROSCOPIC NEPHRECTOMY N/A 01/29/2020   Procedure: XI ROBOTIC ASSISTED LAPAROSCOPIC RADICAL NEPHRECTOMY;  Surgeon: Raynelle Bring, MD;  Location: [facility];  Service: Urology;  Laterality: N/A;   TUBAL LIGATION  1980    Current Medications: No outpatient medications have been marked as taking for the 08/23/21 encounter (Appointment) with Charlie Pitter, PA-C.   ***   Allergies:   Iodinated contrast media, No healthtouch food allergies, Penicillins, Sulfa antibiotics, and Theophyllines   Social History   Socioeconomic History   Marital status: Divorced    Spouse name: Not on file   Number of children: 2   Years of education: Not on file   Highest education level: Not on file  Occupational History   Occupation: ADMIN ASSISTANT  Tobacco Use   Smoking status: Former    Types: Cigarettes    Quit date: 12/28/1981    Years since quitting: 39.6   Smokeless tobacco: Never  Vaping Use   Vaping Use: Never used  Substance and Sexual  Activity   Alcohol use: Yes    Comment: occas.   Drug use: No   Sexual activity: Never    Birth control/protection: Post-menopausal  Other Topics Concern   Not on file  Social History Narrative   0 caffeine drinks daily    Social Determinants of Health   Financial Resource Strain: Not on file  Food Insecurity: Not on file  Transportation Needs: Not on file  Physical Activity: Not on file  Stress: Not on file  Social Connections: Not on file     Family History:  The patient's ***family history includes Hodges in her mother; Colonic polyp in her cousin; Diabetes in her father; Hypertension in her father. There is no history of Colon Hodges.  ROS:   Please see the history of present illness. Otherwise, review of systems is positive for ***.  All other systems are reviewed and otherwise negative.    EKG(s)/Additional Labs   EKG:  EKG is ordered today, personally reviewed,  demonstrating ***  Recent Labs: 07/12/2021: ALT 10; Hemoglobin 10.6; Platelet Count 270; TSH 6.733 07/13/2021: NT-Pro BNP 98 07/21/2021: Magnesium 2.1 07/29/2021: BUN 42; Creatinine, Ser 0.95; Potassium 4.4; Sodium 136  Recent Lipid Panel    Component Value Date/Time   CHOL 152 05/12/2021 0000   TRIG 215 (H) 05/12/2021 0000   HDL 48 05/12/2021 0000   CHOLHDL 3.2 05/12/2021 0000   CHOLHDL 5 08/12/2014 0805   LDLCALC 69 05/12/2021 0000   LDLDIRECT 69.0 08/12/2014 0805    PHYSICAL EXAM:    VS:  There were no vitals taken for this visit.  BMI: There is no height or weight on file to calculate BMI.  GEN: Well nourished, well developed female in no acute distress HEENT: normocephalic, atraumatic Neck: no JVD, carotid bruits, or masses Cardiac: ***RRR; no murmurs, rubs, or gallops, no edema  Respiratory:  clear to auscultation bilaterally, normal work of breathing GI: soft, nontender, nondistended, + BS MS: no deformity or atrophy Skin: warm and dry, no rash Neuro:  Alert and Oriented x 3, Strength and  sensation are intact, follows commands Psych: euthymic mood, full affect  Wt Readings from Last 3 Encounters:  05/02/21 220 lb 6.4 oz (100 kg)  04/18/21 212 lb 12.8 oz (96.5 kg)  03/09/21 210 lb 6.4 oz (95.4 kg)     ASSESSMENT & PLAN:   ***     Disposition: F/u with ***   Medication Adjustments/Labs and Tests Ordered: Current medicines are reviewed at length with the patient today.  Concerns regarding medicines are outlined above. Medication changes, Labs and Tests ordered today are summarized above and listed in the Patient Instructions accessible in Encounters.   Signed, Charlie Pitter, PA-C  08/22/2021 7:48 AM    [place] Phone: [phone]; Fax: [phone]

## 2021-08-23 ENCOUNTER — Ambulatory Visit: Payer: Medicare Other | Admitting: Physician Assistant

## 2021-08-23 ENCOUNTER — Ambulatory Visit: Payer: Medicare Other | Admitting: *Deleted

## 2021-08-23 DIAGNOSIS — I2510 Atherosclerotic heart disease of native coronary artery without angina pectoris: Secondary | ICD-10-CM

## 2021-08-23 DIAGNOSIS — I4819 Other persistent atrial fibrillation: Secondary | ICD-10-CM | POA: Diagnosis not present

## 2021-08-23 DIAGNOSIS — Z5181 Encounter for therapeutic drug level monitoring: Secondary | ICD-10-CM | POA: Diagnosis not present

## 2021-08-23 DIAGNOSIS — Z7189 Other specified counseling: Secondary | ICD-10-CM

## 2021-08-23 DIAGNOSIS — R600 Localized edema: Secondary | ICD-10-CM

## 2021-08-23 DIAGNOSIS — I10 Essential (primary) hypertension: Secondary | ICD-10-CM

## 2021-08-23 DIAGNOSIS — I480 Paroxysmal atrial fibrillation: Secondary | ICD-10-CM

## 2021-08-23 DIAGNOSIS — N182 Chronic kidney disease, stage 2 (mild): Secondary | ICD-10-CM

## 2021-08-23 DIAGNOSIS — I351 Nonrheumatic aortic (valve) insufficiency: Secondary | ICD-10-CM

## 2021-08-23 LAB — POCT INR: INR: 4.3 — AB (ref 2.0–3.0)

## 2021-08-23 NOTE — Patient Instructions (Signed)
Description   Hold today's dose and take 1/2 tablet tomorrow then start taking Warfarin 1/2 tablet daily except for 1 tablet on Monday, Wednesday, and Friday. Recheck INR in 2 weeks.  Stay consistent with greens (2-3 servings per week).  Call our office if you have any questions or if placed on any new medications [phone] or [phone].

## 2021-08-24 ENCOUNTER — Telehealth (HOSPITAL_COMMUNITY): Payer: Self-pay

## 2021-08-24 DIAGNOSIS — C641 Malignant neoplasm of right kidney, except renal pelvis: Secondary | ICD-10-CM | POA: Diagnosis not present

## 2021-08-24 NOTE — Telephone Encounter (Signed)
Attempted to contact the patient, by the number wasn't working. Will try again later. S.Arbor Leer EMTP

## 2021-08-26 ENCOUNTER — Ambulatory Visit: Payer: Medicare Other | Admitting: Physician Assistant

## 2021-08-30 ENCOUNTER — Ambulatory Visit (HOSPITAL_COMMUNITY): Payer: Medicare Other | Attending: Cardiology

## 2021-08-30 DIAGNOSIS — I2584 Coronary atherosclerosis due to calcified coronary lesion: Secondary | ICD-10-CM | POA: Diagnosis not present

## 2021-08-30 DIAGNOSIS — Z5181 Encounter for therapeutic drug level monitoring: Secondary | ICD-10-CM | POA: Insufficient documentation

## 2021-08-30 DIAGNOSIS — I2510 Atherosclerotic heart disease of native coronary artery without angina pectoris: Secondary | ICD-10-CM | POA: Insufficient documentation

## 2021-08-30 DIAGNOSIS — Z79899 Other long term (current) drug therapy: Secondary | ICD-10-CM | POA: Diagnosis not present

## 2021-08-30 LAB — MYOCARDIAL PERFUSION IMAGING
LV dias vol: 77 mL (ref 46–106)
LV sys vol: 27 mL
Nuc Stress EF: 65 %
Peak HR: 80 {beats}/min
Rest HR: 62 {beats}/min
Rest Nuclear Isotope Dose: 10.4 mCi
SDS: 2
SRS: 1
SSS: 3
ST Depression (mm): 0 mm
Stress Nuclear Isotope Dose: 32.3 mCi
TID: 1.08

## 2021-08-30 MED ORDER — TECHNETIUM TC 99M TETROFOSMIN IV KIT
32.3000 | PACK | Freq: Once | INTRAVENOUS | Status: AC | PRN
  Administered 2021-08-30: 32.3 via INTRAVENOUS

## 2021-08-30 MED ORDER — REGADENOSON 0.4 MG/5ML IV SOLN
0.4000 mg | Freq: Once | INTRAVENOUS | Status: AC
  Administered 2021-08-30: 0.4 mg via INTRAVENOUS

## 2021-08-30 MED ORDER — TECHNETIUM TC 99M TETROFOSMIN IV KIT
10.4000 | PACK | Freq: Once | INTRAVENOUS | Status: AC | PRN
  Administered 2021-08-30: 10.4 via INTRAVENOUS

## 2021-09-01 ENCOUNTER — Telehealth: Payer: Self-pay | Admitting: Physician Assistant

## 2021-09-01 NOTE — Telephone Encounter (Signed)
Patient for her test results

## 2021-09-01 NOTE — Telephone Encounter (Signed)
Informed patient of results and verbal understanding expressed.  

## 2021-09-04 NOTE — Progress Notes (Signed)
Cardiology Office Note    Date:  09/05/2021   ID:  Latasha Hodges, DOB [birth year], MRN [id number]  PCP:  Kelton Pillar, MD  Cardiologist:  Fransico Him, MD  Electrophysiologist:  None   Chief Complaint: f/u edema  History of Present Illness:   Latasha Hodges is a 76 y.o. female with history of persistent atrial fibrillation, RBBB, DM, HTN, HLD, spinal stenosis, kidney CA s/p right nephrectomy and chemotherapy, CKD stage 2 by labs, mild AI, anemia, coronary calcification seen on CT, pulmonary nodule (benign appearing per 04/2021 report), obesity who is seen for follow-up of lower extremity edema.   She has followed with Dr. [name] for many years for history of atrial fib, managed with flecainide and warfarin. DOACs were previously too expensive per patient. Stress test in 2019 was normal. A non-cardiac CT in 02/2021 incidentally picked up coronary calcification, so a calcium score was pursued 04/2021 showing CAC of 47 (54%ile), also showed benign appearing pulm nodule, aortic atherosclerosis and emphysema. Dr. [name] recommended to switch simvastatin to rosuvastatin with f/u labs. She was seen for evaluation of worsening lower extremity edema 06/2021 that started shortly before going on a cruise. She reported urinating a lot, sometimes with concern of incontinence, though this was not a new symptom. She was only taking flecainide once a day due to forgetting PM dose but had not had any issues with breakthrough AFib in years. Labs showed normal Cr and BNP. Trial of low dose Lasix was added, though even with addition of low dose she bumped her Cr to 1.42 so Lasix was stopped and lisinopril was transiently held. Repeat Cr was 0.95. Magnesium was increased to BID with recheck Mg at goal (though patient reports today she had not increased this at all, had continued to take once daily, though isn't sure if she is on gluconate or oxide). 2D echo 08/05/21 showed EF 60-65%, G1DD, mild LAE, mild AI, normal  RA pressure, normal size IVC. I had reached out to Dr. [name] regarding updated ischemic testing given flecainide + coronary calcification and we arranged Lexiscan nuc which was normal.  She returns for follow-up. She reports her swelling has improved without specific intervention since last visit. It is still present but she is now able to get her shoes on without difficulty. She saw her urologist at our recommendation due to persistently elevated BUN and they have referred her to nephrology. She otherwise denies any new symptoms. No recent breakthrough atrial fib. She has repeat CT scans later this week for reassessment by the oncology team. She is unsure what her weight has been doing at home and declines to be weighed. She confirms that she is remaining on warfarin rather than switching to DOAC due to cost.   Labwork independently reviewed: 07/29/21 K 4.4, BUN 42, Cr 0.95  07/21/21 K 4.4, BUN 55, Cr 1.42, Mg 2.1  07/13/21, Mg 1.9, pBNP 98, FT4 wnl     Cardiology Studies:   Studies reviewed are outlined and summarized above. Reports included below if pertinent.   Nuc Stress 08/30/21   The study is normal. The study is low risk.   No ST deviation was noted.   LV perfusion is normal. There is no evidence of ischemia. There is no evidence of infarction.   Left ventricular function is normal. Nuclear stress EF: 65 %. The left ventricular ejection fraction is normal (55-65%). End diastolic cavity size is normal. End systolic cavity size is normal.   Prior study available for comparison from  08/15/2017. No changes compared to prior study.   2D echo 08/05/21     1. Left ventricular ejection fraction, by estimation, is 60 to 65%. The  left ventricle has normal function. The left ventricle has no regional  wall motion abnormalities. Left ventricular diastolic parameters are  consistent with Grade I diastolic  dysfunction (impaired relaxation).   2. Right ventricular systolic function is normal. The  right ventricular  size is mildly enlarged.   3. Left atrial size was mildly dilated.   4. The mitral valve is normal in structure. Trivial mitral valve  regurgitation. No evidence of mitral stenosis.   5. The aortic valve is tricuspid. There is moderate calcification of the  aortic valve. Aortic valve regurgitation is mild. Aortic valve  sclerosis/calcification is present, without any evidence of aortic  stenosis.   6. The inferior vena cava is normal in size with greater than 50%  respiratory variability, suggesting right atrial pressure of 3 mmHg.   Calcium Score 04/2021  EXAM:  Coronary Calcium Score     TECHNIQUE:  A gated, non-contrast computed tomography scan of the heart was  performed using 70m slice thickness. Axial images were analyzed on a  dedicated workstation. Calcium scoring of the coronary arteries was  performed using the Agatston method.     FINDINGS:  Coronary arteries: Normal origins.     Coronary Calcium Score:     Left main: 5.44     Left anterior descending artery: 10.3     Left circumflex artery: 31.9     Right coronary artery: 0     Total: 47.7     Percentile: 54     Pericardium: Normal.     Ascending Aorta: Normal caliber, with mild proximal aorta  calcification.     [facility]-cardiac: [facility]     IMPRESSION:  Coronary calcium score of 47. This was 575percentile for age-,  race-, and sex-matched controls.     RECOMMENDATIONS:  Coronary artery calcium (CAC) score is a strong predictor of  incident coronary heart disease (CHD) and provides predictive  information beyond traditional risk factors. CAC scoring is  reasonable to use in the decision to withhold, postpone, or initiate  statin therapy in intermediate-risk or selected borderline-risk  asymptomatic adults (age 74-75years and LDL-C >=70 to <190 mg/dL)  who do not have diabetes or established atherosclerotic  cardiovascular disease (ASCVD).* In  intermediate-risk (10-year ASCVD  risk >=7.5% to <20%) adults or selected borderline-risk (10-year  ASCVD risk >=5% to <7.5%) adults in whom a CAC score is measured for  the purpose of making a treatment decision the following  recommendations have been made:     If CAC=0, it is reasonable to withhold statin therapy and reassess  in 5 to 10 years, as long as higher risk conditions are absent  (diabetes mellitus, family history of premature CHD in first degree  relatives (males <55 years; females <65 years), cigarette smoking,  or LDL >=190 mg/dL).     If CAC is 1 to 99, it is reasonable to initiate statin therapy for  patients >=572years of age.     If CAC is >=100 or >=75th percentile, it is reasonable to initiate  statin therapy at any age.     Cardiology referral should be considered for patients with CAC  scores >=400 or >=75th percentile.     *2018 AHA/ACC/AACVPR/AAPA/ABC/ACPM/ADA/AGS/APhA/ASPC/NLA/PCNA  Guideline on the Management of Blood Cholesterol: A Report of the  [employer]  of Cardiology/American Heart Association Task Force  on Clinical Practice Guidelines. J Am Coll Cardiol.  2019;73(24):3168-3209.     Berniece Salines, DO [facility]        Electronically Signed    By: Berniece Salines D.O.    On: 04/28/2021 20:16        Past Medical History:  Diagnosis Date   Acquired solitary kidney    left side;   s/p right nephrectomy 12/ 2021   Agatston coronary artery calcium score less than 100    Coronary calcium score was 27 which is low at 54th percentile for age and sex matched controls.   Anticoagulated on Coumadin    long-term;  managed by cardiology   Aortic atherosclerosis (HCC)    Arthritis    Chronic kidney disease (CKD), stage III (moderate) (HCC)    Coronary artery calcification    Hyperlipidemia, mixed    Hypertension    Left wrist fracture    Malignant neoplasm of kidney, right ([place]) 01/2020   oncologist--- dr Alen Blew;  dx 12/ 2021  Stage IIIa,  Clear cell  type;  01-29-2020  s/p robotic right radical nephrectomy (one posisitve node);  chemo 03-31-2020 to  08-11-2020   Mild aortic insufficiency    Persistent atrial fibrillation [facility])    cardiologist--- dr t. turner;  nuclear sress test-- 08-15-2017 low risk normal perfusion, nuclear ef 61%;  event monitor 12-18-2017 in epic   RBBB (right bundle branch block)    chronic   Type 2 diabetes mellitus ([place])     Past Surgical History:  Procedure Laterality Date   COLONOSCOPY  12/29/2010   Procedure: COLONOSCOPY;  Surgeon: Owens Loffler, MD;  Location: [facility];  Service: Endoscopy;  Laterality: N/A;   HARDWARE REMOVAL Left 05/02/2021   Procedure: left wrist removal of hardware- dorsal spanning plate;  Surgeon: [name], MD;  Location: [facility];  Service: Orthopedics;  Laterality: Left;   IR IMAGING GUIDED PORT INSERTION  07/08/2020   LUMBAR LAMINECTOMY/DECOMPRESSION MICRODISCECTOMY Left 07/14/2020   Procedure: Laminectomy and Foraminotomy - Lumbar three-Lumbar four- Lumbar four-Lumbar five - left with sublaminar decompression;  Surgeon: Eustace Moore, MD;  Location: [place];  Service: Neurosurgery;  Laterality: Left;   ORIF WRIST FRACTURE Left 02/10/2021   Procedure: OPEN REDUCTION INTERNAL FIXATION (ORIF) WRIST FRACTURE;  Surgeon: [name], MD;  Location: [facility];  Service: Orthopedics;  Laterality: Left;  with MAC   ROBOT ASSISTED LAPAROSCOPIC NEPHRECTOMY N/A 01/29/2020   Procedure: XI ROBOTIC ASSISTED LAPAROSCOPIC RADICAL NEPHRECTOMY;  Surgeon: Raynelle Bring, MD;  Location: [facility];  Service: Urology;  Laterality: N/A;   TUBAL LIGATION  1980    Current Medications: Current Meds  Medication Sig   clotrimazole-betamethasone (LOTRISONE) cream Apply 1 application topically 2 (two) times daily. May use with OTC zinc oxide cream.   Dulaglutide (TRULICITY) 1.5 MV/6.7MC SOPN Inject 1.5 mg into the skin every Thursday.   fenofibrate micronized (LOFIBRA) 200 MG capsule TAKE 1  CAPSULE BY MOUTH DAILY BEFORE BREAKFAST   flecainide (TAMBOCOR) 50 MG tablet Take 50 mg by mouth twice daily.   levothyroxine (SYNTHROID) 75 MCG tablet Take 75 mcg by mouth every morning.   lisinopril (ZESTRIL) 20 MG tablet TAKE 1 TABLET(20 MG) BY MOUTH DAILY   magnesium oxide (MAG-OX) 400 (240 Mg) MG tablet Take 400 mg by mouth daily.   metFORMIN (GLUCOPHAGE) 1000 MG tablet Take 1,000 mg by mouth daily with breakfast.   metoprolol succinate (TOPROL-XL) 50 MG 24 hr tablet TAKE 1  TABLET(50 MG) BY MOUTH DAILY   warfarin (COUMADIN) 5 MG tablet TAKE 1 TABLET BY MOUTH AS DIRECTED BY COUMADIN CLINIC      Allergies:   Iodinated contrast media, No healthtouch food allergies, Penicillins, Sulfa antibiotics, and Theophyllines   Social History   Socioeconomic History   Marital status: Divorced    Spouse name: Not on file   Number of children: 2   Years of education: Not on file   Highest education level: Not on file  Occupational History   Occupation: ADMIN ASSISTANT  Tobacco Use   Smoking status: Former    Types: Cigarettes    Quit date: 12/28/1981    Years since quitting: 39.7   Smokeless tobacco: Never  Vaping Use   Vaping Use: Never used  Substance and Sexual Activity   Alcohol use: Yes    Comment: occas.   Drug use: No   Sexual activity: Never    Birth control/protection: Post-menopausal  Other Topics Concern   Not on file  Social History Narrative   0 caffeine drinks daily    Social Determinants of Health   Financial Resource Strain: Not on file  Food Insecurity: Not on file  Transportation Needs: Not on file  Physical Activity: Not on file  Stress: Not on file  Social Connections: Not on file     Family History:  The patient's family history includes Cancer in her mother; Colonic polyp in her cousin; Diabetes in her father; Hypertension in her father. There is no history of Colon cancer.  ROS:   Please see the history of present illness.  All other systems are  reviewed and otherwise negative.    EKG(s)/Additional Labs   EKG:  EKG is not ordered today  Recent Labs: 07/12/2021: ALT 10; Hemoglobin 10.6; Platelet Count 270; TSH 6.733 07/13/2021: NT-Pro BNP 98 07/21/2021: Magnesium 2.1 07/29/2021: BUN 42; Creatinine, Ser 0.95; Potassium 4.4; Sodium 136  Recent Lipid Panel    Component Value Date/Time   CHOL 152 05/12/2021 0000   TRIG 215 (H) 05/12/2021 0000   HDL 48 05/12/2021 0000   CHOLHDL 3.2 05/12/2021 0000   CHOLHDL 5 08/12/2014 0805   LDLCALC 69 05/12/2021 0000   LDLDIRECT 69.0 08/12/2014 0805    PHYSICAL EXAM:    VS:  BP 128/60   Pulse 72   Ht _0  (1.626 m)   Wt 205 lb (93 kg) Comment: Weight given by patient  SpO2 96%   BMI 35.19 kg/m   BMI: Body mass index is 35.19 kg/m.  GEN: Well nourished, well developed female in no acute distress HEENT: normocephalic, atraumatic Neck: no JVD, carotid bruits, or masses Cardiac: RRR; no murmurs, rubs, or gallops, 1+ BLE edema - circumference improved from prior Respiratory:  clear to auscultation bilaterally, normal work of breathing GI: soft, nontender, nondistended, + BS MS: no deformity or atrophy Skin: warm and dry, no rash Neuro:  Alert and Oriented x 3, Strength and sensation are intact, follows commands Psych: euthymic mood, full affect  Wt Readings from Last 3 Encounters:  09/05/21 205 lb (93 kg)  08/30/21 220 lb (99.8 kg)  05/02/21 220 lb 6.4 oz (100 kg)     ASSESSMENT & PLAN:   1. Lower extremity edema - unclear etiology, possibly venous insufficiency in setting of increasing central obesity. 2D echo showed diastolic dysfunction but normal IVC and normal RA pressure. BNP was also normal. Can be falsely low in obesity, but with just a few doses of oral Lasix she developed  AKI with Cr to 1.42 therefore I suggested f/u with urology. She also continues to have persistently elevated BUN of unclear etiology. They have referred her to nephrology; appreciate their input. Patient  does not wish to re-challenge with alternative diuretic at this time. We discussed conservative measures including elevation and compression hose. Had already discussed sodium, fluid restriction before. She also has repeat CT scans later this week by her oncology team so would be helpful to ensure no changing intraabdominal process contributing to the edema. Her thyroid function is also being followed by her oncology team.  2. CKD stage II with persistently elevated BUN - awaiting nephrology consultation. Creatinine returned to baseline with holding of Lasix - only has on med list PRN at this juncture and not really using.  3. Persistent atrial fibrillation - maintaining NSR on flecainide. Historically she has forgotten the PM dose at times and was encouraged to continue with BID dosing. Continue metoprolol. She confirms that she is remaining on warfarin rather than switching to DOAC due to cost. This is followed by coumadin clinic. Recent lytes OK. She will check at home whether she is taking mag gluconate or oxide - she reports she was only doing once daily when this was rechecked last, so would continue present course.  4. Mild aortic insufficiency  - follow clinically - anticipate recheck echo 3-5 years, sooner if needed.  5. Essential HTN - controlled on lisinopril and metoprolol, no changes made today.  6. Coronary calcification - nuclear stress test reassuring, done for ongoing flecainide rx. No CP or dyspnea. Will update CMET/lipid profile when she returns for INR later this week, fasting 4 hours beforehand, given the change from simvastatin to rosuvastatin earlier this year.     Disposition: F/u with Dr. [name] in 3 months to f/u edema.   Medication Adjustments/Labs and Tests Ordered: Current medicines are reviewed at length with the patient today.  Concerns regarding medicines are outlined above. Medication changes, Labs and Tests ordered today are summarized above and listed in the  Patient Instructions accessible in Encounters.   Signed, Charlie Pitter, PA-C  09/05/2021 3:08 PM    [facility] Phone: [phone]; Fax: [phone]

## 2021-09-05 ENCOUNTER — Encounter: Payer: Self-pay | Admitting: Physician Assistant

## 2021-09-05 ENCOUNTER — Ambulatory Visit (INDEPENDENT_AMBULATORY_CARE_PROVIDER_SITE_OTHER): Payer: Medicare Other | Admitting: Physician Assistant

## 2021-09-05 VITALS — BP 128/60 | HR 72 | Ht 64.0 in | Wt 205.0 lb

## 2021-09-05 DIAGNOSIS — I351 Nonrheumatic aortic (valve) insufficiency: Secondary | ICD-10-CM | POA: Diagnosis not present

## 2021-09-05 DIAGNOSIS — I10 Essential (primary) hypertension: Secondary | ICD-10-CM

## 2021-09-05 DIAGNOSIS — R600 Localized edema: Secondary | ICD-10-CM

## 2021-09-05 DIAGNOSIS — N182 Chronic kidney disease, stage 2 (mild): Secondary | ICD-10-CM | POA: Diagnosis not present

## 2021-09-05 DIAGNOSIS — I4819 Other persistent atrial fibrillation: Secondary | ICD-10-CM

## 2021-09-05 NOTE — Patient Instructions (Addendum)
Medication Instructions:  Take Flecainide twice a day *If you need a refill on your cardiac medications before your next appointment, please call your pharmacy*   Lab Work: Thursday complete labs at [employer], LIPIDS ([place])  If you have labs (blood work) drawn today and your tests are completely normal, you will receive your results only by: [place] (if you have MyChart) OR A paper copy in the mail If you have any lab test that is abnormal or we need to change your treatment, we will call you to review the results.   Testing/Procedures: NONE ORDERED   Follow-Up: At [facility], you and your health needs are our priority.  As part of our continuing mission to provide you with exceptional heart care, we have created designated Provider Care Teams.  These Care Teams include your primary Cardiologist (physician) and Advanced Practice Providers (APPs -  Physician Assistants and Nurse Practitioners) who all work together to provide you with the care you need, when you need it.  We recommend signing up for the patient portal called "MyChart".  Sign up information is provided on this After Visit Summary.  MyChart is used to connect with patients for Virtual Visits (Telemedicine).  Patients are able to view lab/test results, encounter notes, upcoming appointments, etc.  Non-urgent messages can be sent to your provider as well.   To learn more about what you can do with MyChart, go to [email or web address].    Your next appointment:   3 month(s)  The format for your next appointment:   In Person  Provider:   Fransico Him, MD     Other Instructions   Important Information About Sugar

## 2021-09-08 ENCOUNTER — Ambulatory Visit: Payer: Medicare Other

## 2021-09-08 ENCOUNTER — Other Ambulatory Visit: Payer: Self-pay | Admitting: *Deleted

## 2021-09-08 DIAGNOSIS — D49511 Neoplasm of unspecified behavior of right kidney: Secondary | ICD-10-CM

## 2021-09-08 DIAGNOSIS — N182 Chronic kidney disease, stage 2 (mild): Secondary | ICD-10-CM

## 2021-09-08 DIAGNOSIS — I480 Paroxysmal atrial fibrillation: Secondary | ICD-10-CM

## 2021-09-08 DIAGNOSIS — I4819 Other persistent atrial fibrillation: Secondary | ICD-10-CM

## 2021-09-08 DIAGNOSIS — I351 Nonrheumatic aortic (valve) insufficiency: Secondary | ICD-10-CM

## 2021-09-08 DIAGNOSIS — R600 Localized edema: Secondary | ICD-10-CM

## 2021-09-08 DIAGNOSIS — Z5181 Encounter for therapeutic drug level monitoring: Secondary | ICD-10-CM

## 2021-09-08 DIAGNOSIS — I10 Essential (primary) hypertension: Secondary | ICD-10-CM

## 2021-09-08 DIAGNOSIS — Z7189 Other specified counseling: Secondary | ICD-10-CM

## 2021-09-08 LAB — POCT INR: INR: 2.6 (ref 2.0–3.0)

## 2021-09-08 NOTE — Patient Instructions (Signed)
CONTINUE TAKING 1/2 tablet daily except for 1 tablet on Monday, Wednesday, and Friday. Recheck INR in 6 weeks.  Stay consistent with greens (2-3 servings per week).  Call our office if you have any questions or if placed on any new medications [phone] or [phone].

## 2021-09-09 ENCOUNTER — Inpatient Hospital Stay: Payer: Medicare Other | Attending: Oncology

## 2021-09-09 ENCOUNTER — Ambulatory Visit (HOSPITAL_COMMUNITY)
Admission: RE | Admit: 2021-09-09 | Discharge: 2021-09-09 | Disposition: A | Discharge status: Home / Self Care | Payer: Medicare Other | Source: Ambulatory Visit | Attending: Oncology | Admitting: Oncology

## 2021-09-09 ENCOUNTER — Other Ambulatory Visit: Payer: Self-pay

## 2021-09-09 DIAGNOSIS — E039 Hypothyroidism, unspecified: Secondary | ICD-10-CM | POA: Insufficient documentation

## 2021-09-09 DIAGNOSIS — D49511 Neoplasm of unspecified behavior of right kidney: Secondary | ICD-10-CM | POA: Insufficient documentation

## 2021-09-09 DIAGNOSIS — Z95828 Presence of other vascular implants and grafts: Secondary | ICD-10-CM

## 2021-09-09 DIAGNOSIS — K5730 Diverticulosis of large intestine without perforation or abscess without bleeding: Secondary | ICD-10-CM | POA: Diagnosis not present

## 2021-09-09 DIAGNOSIS — E7800 Pure hypercholesterolemia, unspecified: Secondary | ICD-10-CM | POA: Insufficient documentation

## 2021-09-09 DIAGNOSIS — I700 Atherosclerosis of aorta: Secondary | ICD-10-CM | POA: Diagnosis not present

## 2021-09-09 DIAGNOSIS — Z85528 Personal history of other malignant neoplasm of kidney: Secondary | ICD-10-CM | POA: Insufficient documentation

## 2021-09-09 DIAGNOSIS — C641 Malignant neoplasm of right kidney, except renal pelvis: Secondary | ICD-10-CM | POA: Diagnosis not present

## 2021-09-09 LAB — CMP (CANCER CENTER ONLY)
ALT: 10 U/L (ref 0–44)
AST: 11 U/L — ABNORMAL LOW (ref 15–41)
Albumin: 3.8 g/dL (ref 3.5–5.0)
Alkaline Phosphatase: 41 U/L (ref 38–126)
Anion gap: 5 (ref 5–15)
BUN: 41 mg/dL — ABNORMAL HIGH (ref 8–23)
CO2: 27 mmol/L (ref 22–32)
Calcium: 9.5 mg/dL (ref 8.9–10.3)
Chloride: 105 mmol/L (ref 98–111)
Creatinine: 0.92 mg/dL (ref 0.44–1.00)
GFR, Estimated: >60 mL/min (ref >60)
Glucose, Bld: 210 mg/dL — ABNORMAL HIGH (ref 70–99)
Potassium: 4.3 mmol/L (ref 3.5–5.1)
Sodium: 137 mmol/L (ref 135–145)
Total Bilirubin: 0.3 mg/dL (ref 0.3–1.2)
Total Protein: 6.6 g/dL (ref 6.5–8.1)

## 2021-09-09 LAB — CBC WITH DIFFERENTIAL (CANCER CENTER ONLY)
Abs Immature Granulocytes: 0.07 10*3/uL (ref 0.00–0.07)
Basophils Absolute: 0.1 10*3/uL (ref 0.0–0.1)
Basophils Relative: 1 %
Eosinophils Absolute: 0.1 10*3/uL (ref 0.0–0.5)
Eosinophils Relative: 1 %
HCT: 32.5 % — ABNORMAL LOW (ref 36.0–46.0)
Hemoglobin: 10.8 g/dL — ABNORMAL LOW (ref 12.0–15.0)
Immature Granulocytes: 1 %
Lymphocytes Relative: 26 %
Lymphs Abs: 1.9 10*3/uL (ref 0.7–4.0)
MCH: 28.2 pg (ref 26.0–34.0)
MCHC: 33.2 g/dL (ref 30.0–36.0)
MCV: 84.9 fL (ref 80.0–100.0)
Monocytes Absolute: 0.6 10*3/uL (ref 0.1–1.0)
Monocytes Relative: 8 %
Neutro Abs: 4.5 10*3/uL (ref 1.7–7.7)
Neutrophils Relative %: 63 %
Platelet Count: 251 10*3/uL (ref 150–400)
RBC: 3.83 MIL/uL — ABNORMAL LOW (ref 3.87–5.11)
RDW: 13.2 % (ref 11.5–15.5)
WBC Count: 7.1 10*3/uL (ref 4.0–10.5)
nRBC: 0 % (ref 0.0–0.2)

## 2021-09-09 LAB — LIPID PANEL
Cholesterol: 124 mg/dL (ref 0–200)
HDL: 44 mg/dL (ref >40)
LDL Cholesterol: 41 mg/dL (ref 0–99)
Total CHOL/HDL Ratio: 2.8 RATIO
Triglycerides: 195 mg/dL — ABNORMAL HIGH (ref <150)
VLDL: 39 mg/dL (ref 0–40)

## 2021-09-09 LAB — TSH: TSH: 4.351 u[IU]/mL (ref 0.350–4.500)

## 2021-09-09 MED ORDER — HEPARIN SOD (PORK) LOCK FLUSH 100 UNIT/ML IV SOLN
500.0000 [IU] | INTRAVENOUS | Status: AC | PRN
  Administered 2021-09-09: 500 [IU]

## 2021-09-09 MED ORDER — SODIUM CHLORIDE 0.9% FLUSH
10.0000 mL | Freq: Once | INTRAVENOUS | Status: AC
  Administered 2021-09-09: 10 mL

## 2021-09-14 ENCOUNTER — Other Ambulatory Visit: Payer: Self-pay | Admitting: Cardiology

## 2021-09-14 NOTE — Telephone Encounter (Signed)
fenofibrate micronized (LOFIBRA) 200 MG capsule Medication Date: 03/02/2021 Department: [address] Office Ordering/Authorizing: Sueanne Margarita, MD   Order Providers  Prescribing Provider Encounter Provider  Sueanne Margarita, MD Sueanne Margarita, MD   Outpatient Medication Detail   Disp Refills Start End   fenofibrate micronized (LOFIBRA) 200 MG capsule 90 capsule 2 03/02/2021    Sig: TAKE 1 CAPSULE BY MOUTH DAILY BEFORE BREAKFAST   Sent to pharmacy as: fenofibrate micronized (LOFIBRA) 200 MG capsule   E-Prescribing Status: Receipt confirmed by pharmacy (03/02/2021  9:14 AM EST)    Pharmacy  [id number] - [place], [address] AT [place] [address]   Additional Information  Associated Reports  View Encounter  Priority and Order Details

## 2021-09-16 ENCOUNTER — Other Ambulatory Visit: Payer: Self-pay

## 2021-09-16 ENCOUNTER — Inpatient Hospital Stay: Payer: Medicare Other | Attending: Oncology | Admitting: Oncology

## 2021-09-16 VITALS — BP 145/64 | HR 71 | Temp 98.1°F | Resp 16 | Wt 222.1 lb

## 2021-09-16 DIAGNOSIS — Z85528 Personal history of other malignant neoplasm of kidney: Secondary | ICD-10-CM | POA: Diagnosis not present

## 2021-09-16 DIAGNOSIS — D49511 Neoplasm of unspecified behavior of right kidney: Secondary | ICD-10-CM | POA: Diagnosis not present

## 2021-09-16 DIAGNOSIS — E039 Hypothyroidism, unspecified: Secondary | ICD-10-CM | POA: Diagnosis not present

## 2021-09-16 DIAGNOSIS — Z9221 Personal history of antineoplastic chemotherapy: Secondary | ICD-10-CM | POA: Insufficient documentation

## 2021-09-16 DIAGNOSIS — Z79890 Hormone replacement therapy (postmenopausal): Secondary | ICD-10-CM | POA: Diagnosis not present

## 2021-09-16 DIAGNOSIS — Z95828 Presence of other vascular implants and grafts: Secondary | ICD-10-CM | POA: Diagnosis not present

## 2021-09-16 DIAGNOSIS — Z905 Acquired absence of kidney: Secondary | ICD-10-CM | POA: Insufficient documentation

## 2021-09-16 NOTE — Progress Notes (Signed)
Hematology and Oncology Follow Up Visit  Latasha Hodges [id number] [birth year] 76 y.o. 09/16/2021 2:52 PM Latasha Hodges, MDGriffin, Latasha Sheffield, MD   Principle Diagnosis: 76 year old woman with T3aN1 clear-cell renal cell carcinoma diagnosed in December 2021.  ve    Prior Therapy:   She is status post right robotic assisted laparoscopic radical nephrectomy completed by Dr. Alinda Money on January 29, 2020.  The final pathology showed a 7.0 cm clear cell renal cell carcinoma with tumor extends into the renal vein, renal capsule and renal sinus fat indicating T3a disease with 1 lymph node involvement.  No sarcomatoid or rhabdoid features noted with histological grade of G3.  Pembrolizumab 200 mg every 3 weeks started on March 31, 2020.  She received 400 mg starting with cycle 5 of therapy in May 2022.  Last cycle given on August 11, 2020.  Current therapy: Active surveillance.  Interim History: [name] presents today for repeat evaluation.  Since the last visit, she reports no major changes in her health.  She continues to recover reasonably well after discontinuation of immunotherapy.  She denies any pruritus, fatigue or shortness of breath.  She does have lower extremity edema.   Medications: Reviewed without changes. Current Outpatient Medications  Medication Sig Dispense Refill   clotrimazole-betamethasone (LOTRISONE) cream Apply 1 application topically 2 (two) times daily. May use with OTC zinc oxide cream. 135 g 0   Dulaglutide (TRULICITY) 1.5 OL/0.7EM SOPN Inject 1.5 mg into the skin every Thursday.     fenofibrate micronized (LOFIBRA) 200 MG capsule TAKE 1 CAPSULE BY MOUTH DAILY BEFORE BREAKFAST 90 capsule 2   flecainide (TAMBOCOR) 50 MG tablet TAKE 1 TABLET(50 MG) BY MOUTH TWICE DAILY 180 tablet 1   furosemide (LASIX) 20 MG tablet Take 1 tablet (20 mg total) by mouth daily as needed for edema (leg). (Patient not taking: Reported on 09/05/2021) 30 tablet 2   levothyroxine (SYNTHROID)  75 MCG tablet Take 75 mcg by mouth every morning.     lisinopril (ZESTRIL) 20 MG tablet TAKE 1 TABLET(20 MG) BY MOUTH DAILY 90 tablet 3   magnesium oxide (MAG-OX) 400 (240 Mg) MG tablet Take 400 mg by mouth daily.     metFORMIN (GLUCOPHAGE) 1000 MG tablet Take 1,000 mg by mouth daily with breakfast.  0   metoprolol succinate (TOPROL-XL) 50 MG 24 hr tablet TAKE 1 TABLET(50 MG) BY MOUTH DAILY 90 tablet 3   rosuvastatin (CRESTOR) 40 MG tablet Take 1 tablet (40 mg total) by mouth daily. 90 tablet 3   warfarin (COUMADIN) 5 MG tablet TAKE 1 TABLET BY MOUTH AS DIRECTED BY COUMADIN CLINIC 90 tablet 1   No current facility-administered medications for this visit.     Allergies:  Allergies  Allergen Reactions   Iodinated Contrast Media Hives, Swelling and Rash   No [employer] Food Allergies Rash    Seafood   Penicillins Rash    Childhood reaction.   Sulfa Antibiotics Hives and Rash   Theophyllines Rash      Physical Exam:       Blood pressure (!) 145/64, pulse 71, temperature 98.1 F (36.7 C), temperature source Temporal, resp. rate 16, weight 222 lb 1.6 oz (100.7 kg), SpO2 97 %.    ECOG: 1   General appearance: Comfortable appearing without any discomfort Head: Normocephalic without any trauma Oropharynx: Mucous membranes are moist and pink without any thrush or ulcers. Eyes: Pupils are equal and round reactive to light. Lymph nodes: No cervical, supraclavicular, inguinal or axillary lymphadenopathy.   Heart:regular  rate and rhythm.  S1 and S2 without leg edema. Lung: Clear without any rhonchi or wheezes.  No dullness to percussion. Abdomin: Soft, nontender, nondistended with good bowel sounds.  No hepatosplenomegaly. Musculoskeletal: No joint deformity or effusion.  Full range of motion noted. Neurological: No deficits noted on motor, sensory and deep tendon reflex exam. Skin: No petechial rash or dryness.  Appeared moist.               Lab Results: Lab  Results  Component Value Date   WBC 7.1 09/09/2021   HGB 10.8 (L) 09/09/2021   HCT 32.5 (L) 09/09/2021   MCV 84.9 09/09/2021   PLT 251 09/09/2021     Chemistry      Component Value Date/Time   NA 137 09/09/2021 1350   NA 136 07/29/2021 1308   K 4.3 09/09/2021 1350   CL 105 09/09/2021 1350   CO2 27 09/09/2021 1350   BUN 41 (H) 09/09/2021 1350   BUN 42 (H) 07/29/2021 1308   CREATININE 0.92 09/09/2021 1350      Component Value Date/Time   CALCIUM 9.5 09/09/2021 1350   ALKPHOS 41 09/09/2021 1350   AST 11 (L) 09/09/2021 1350   ALT 10 09/09/2021 1350   BILITOT 0.3 09/09/2021 1350          IMPRESSION: 1. Stable examination status post right nephrectomy without new or progressive findings to suggest local recurrence or active metastatic disease. 2. Chronically stable small bilateral pulmonary nodules are unchanged. 3. Colonic diverticulosis without findings of acute diverticulitis. 4. Aortic Atherosclerosis (ICD10-I70.0).    Impression and Plan:   76 year old woman with:   1.  T3a N1 clear-cell renal cell carcinoma diagnosed in December 2021.  She is currently on active surveillance after poor tolerance to adjuvant immunotherapy.  Different salvage therapy options including restarting immunotherapy as well as oral targeted therapy would be deferred unless she has relapsed disease.   2.  IV access: Port-A-Cath remains in place and will be flushed periodically.     3.  Immune mediated complications: Resolved at this time without any evidence of dermatitis or changes.   4.  Hypothyroidism: TSH continues to improve and recommended decreasing her Synthroid to 50 mcg and will continue to monitor.  5.  Follow-up: In 2 months for Port-A-Cath flush and MD follow-up in 4 months.   30  minutes were spent on this visit.  The time was dedicated to reviewing laboratory data, imaging studies, treatment choices and future plan of care review.   Zola Button, MD 8/4/20232:52  PM

## 2021-10-10 DIAGNOSIS — M25561 Pain in right knee: Secondary | ICD-10-CM | POA: Diagnosis not present

## 2021-10-20 ENCOUNTER — Ambulatory Visit: Payer: Medicare Other | Attending: Cardiology

## 2021-10-20 DIAGNOSIS — I4819 Other persistent atrial fibrillation: Secondary | ICD-10-CM | POA: Diagnosis not present

## 2021-10-20 DIAGNOSIS — Z7189 Other specified counseling: Secondary | ICD-10-CM

## 2021-10-20 DIAGNOSIS — Z5181 Encounter for therapeutic drug level monitoring: Secondary | ICD-10-CM | POA: Diagnosis not present

## 2021-10-20 DIAGNOSIS — M25561 Pain in right knee: Secondary | ICD-10-CM | POA: Diagnosis not present

## 2021-10-20 DIAGNOSIS — I480 Paroxysmal atrial fibrillation: Secondary | ICD-10-CM

## 2021-10-20 LAB — POCT INR: INR: 3.6 — AB (ref 2.0–3.0)

## 2021-10-20 NOTE — Patient Instructions (Signed)
Description   Hold today's dose and then continue 1/2 tablet daily except for 1 tablet on Monday, Wednesday, and Friday. Recheck INR in 3 weeks.  Stay consistent with greens (2-3 servings per week).  Call our office if you have any questions or if placed on any new medications [phone] or [phone].

## 2021-10-25 DIAGNOSIS — M1711 Unilateral primary osteoarthritis, right knee: Secondary | ICD-10-CM | POA: Diagnosis not present

## 2021-10-27 ENCOUNTER — Other Ambulatory Visit: Payer: Self-pay | Admitting: Oncology

## 2021-11-02 DIAGNOSIS — M25561 Pain in right knee: Secondary | ICD-10-CM | POA: Diagnosis not present

## 2021-11-05 ENCOUNTER — Other Ambulatory Visit: Payer: Self-pay | Admitting: Oncology

## 2021-11-06 ENCOUNTER — Encounter: Payer: Self-pay | Admitting: Oncology

## 2021-11-10 ENCOUNTER — Ambulatory Visit: Payer: Medicare Other | Attending: Cardiology

## 2021-11-10 DIAGNOSIS — I480 Paroxysmal atrial fibrillation: Secondary | ICD-10-CM | POA: Diagnosis not present

## 2021-11-10 DIAGNOSIS — Z5181 Encounter for therapeutic drug level monitoring: Secondary | ICD-10-CM | POA: Diagnosis not present

## 2021-11-10 DIAGNOSIS — I4819 Other persistent atrial fibrillation: Secondary | ICD-10-CM

## 2021-11-10 DIAGNOSIS — Z7189 Other specified counseling: Secondary | ICD-10-CM

## 2021-11-10 DIAGNOSIS — M25561 Pain in right knee: Secondary | ICD-10-CM | POA: Diagnosis not present

## 2021-11-10 LAB — POCT INR: INR: 4.3 — AB (ref 2.0–3.0)

## 2021-11-10 NOTE — Patient Instructions (Signed)
Description   Hold today's dose and only take 1/2 tablet tomorrow and then START 1/2 tablet daily except for 1 tablet on Monday and Friday. Recheck INR in 2 weeks.  Stay consistent with greens (2-3 servings per week).  Call our office if you have any questions or if placed on any new medications [phone].

## 2021-11-11 ENCOUNTER — Inpatient Hospital Stay: Payer: Medicare Other | Attending: Oncology

## 2021-11-11 ENCOUNTER — Other Ambulatory Visit: Payer: Self-pay

## 2021-11-11 DIAGNOSIS — Z79899 Other long term (current) drug therapy: Secondary | ICD-10-CM | POA: Diagnosis not present

## 2021-11-11 DIAGNOSIS — D49511 Neoplasm of unspecified behavior of right kidney: Secondary | ICD-10-CM

## 2021-11-11 DIAGNOSIS — Z95828 Presence of other vascular implants and grafts: Secondary | ICD-10-CM

## 2021-11-11 DIAGNOSIS — Z85528 Personal history of other malignant neoplasm of kidney: Secondary | ICD-10-CM | POA: Insufficient documentation

## 2021-11-11 LAB — CBC WITH DIFFERENTIAL (CANCER CENTER ONLY)
Abs Immature Granulocytes: 0.04 10*3/uL (ref 0.00–0.07)
Basophils Absolute: 0.1 10*3/uL (ref 0.0–0.1)
Basophils Relative: 1 %
Eosinophils Absolute: 0.1 10*3/uL (ref 0.0–0.5)
Eosinophils Relative: 1 %
HCT: 30.6 % — ABNORMAL LOW (ref 36.0–46.0)
Hemoglobin: 10 g/dL — ABNORMAL LOW (ref 12.0–15.0)
Immature Granulocytes: 1 %
Lymphocytes Relative: 29 %
Lymphs Abs: 1.5 10*3/uL (ref 0.7–4.0)
MCH: 28.1 pg (ref 26.0–34.0)
MCHC: 32.7 g/dL (ref 30.0–36.0)
MCV: 86 fL (ref 80.0–100.0)
Monocytes Absolute: 0.5 10*3/uL (ref 0.1–1.0)
Monocytes Relative: 9 %
Neutro Abs: 3 10*3/uL (ref 1.7–7.7)
Neutrophils Relative %: 59 %
Platelet Count: 221 10*3/uL (ref 150–400)
RBC: 3.56 MIL/uL — ABNORMAL LOW (ref 3.87–5.11)
RDW: 13.9 % (ref 11.5–15.5)
WBC Count: 5.2 10*3/uL (ref 4.0–10.5)
nRBC: 0 % (ref 0.0–0.2)

## 2021-11-11 LAB — CMP (CANCER CENTER ONLY)
ALT: 12 U/L (ref 0–44)
AST: 11 U/L — ABNORMAL LOW (ref 15–41)
Albumin: 3.5 g/dL (ref 3.5–5.0)
Alkaline Phosphatase: 54 U/L (ref 38–126)
Anion gap: 5 (ref 5–15)
BUN: 28 mg/dL — ABNORMAL HIGH (ref 8–23)
CO2: 28 mmol/L (ref 22–32)
Calcium: 9 mg/dL (ref 8.9–10.3)
Chloride: 103 mmol/L (ref 98–111)
Creatinine: 0.88 mg/dL (ref 0.44–1.00)
GFR, Estimated: >60 mL/min (ref >60)
Glucose, Bld: 377 mg/dL — ABNORMAL HIGH (ref 70–99)
Potassium: 4.1 mmol/L (ref 3.5–5.1)
Sodium: 136 mmol/L (ref 135–145)
Total Bilirubin: 0.3 mg/dL (ref 0.3–1.2)
Total Protein: 6.1 g/dL — ABNORMAL LOW (ref 6.5–8.1)

## 2021-11-11 LAB — TSH: TSH: 9.547 u[IU]/mL — ABNORMAL HIGH (ref 0.350–4.500)

## 2021-11-11 MED ORDER — SODIUM CHLORIDE 0.9% FLUSH
10.0000 mL | Freq: Once | INTRAVENOUS | Status: AC
  Administered 2021-11-11: 10 mL

## 2021-11-11 MED ORDER — HEPARIN SOD (PORK) LOCK FLUSH 100 UNIT/ML IV SOLN
500.0000 [IU] | INTRAVENOUS | Status: AC | PRN
  Administered 2021-11-11: 500 [IU]

## 2021-11-17 DIAGNOSIS — M25561 Pain in right knee: Secondary | ICD-10-CM | POA: Diagnosis not present

## 2021-11-23 DIAGNOSIS — M25561 Pain in right knee: Secondary | ICD-10-CM | POA: Diagnosis not present

## 2021-11-24 ENCOUNTER — Ambulatory Visit: Payer: Medicare Other | Attending: Cardiology

## 2021-11-24 DIAGNOSIS — I480 Paroxysmal atrial fibrillation: Secondary | ICD-10-CM | POA: Diagnosis not present

## 2021-11-24 DIAGNOSIS — I4819 Other persistent atrial fibrillation: Secondary | ICD-10-CM | POA: Diagnosis not present

## 2021-11-24 DIAGNOSIS — Z7189 Other specified counseling: Secondary | ICD-10-CM | POA: Diagnosis not present

## 2021-11-24 DIAGNOSIS — M4316 Spondylolisthesis, lumbar region: Secondary | ICD-10-CM | POA: Diagnosis not present

## 2021-11-24 DIAGNOSIS — Z5181 Encounter for therapeutic drug level monitoring: Secondary | ICD-10-CM

## 2021-11-24 LAB — POCT INR: INR: 2.9 (ref 2.0–3.0)

## 2021-11-24 NOTE — Patient Instructions (Signed)
Description   Continue 1/2 tablet daily except for 1 tablet on Monday and Friday. Recheck INR in 3 weeks.  Stay consistent with greens (2-3 servings per week).  Call our office if you have any questions or if placed on any new medications [phone].

## 2021-12-12 ENCOUNTER — Other Ambulatory Visit: Payer: Self-pay | Admitting: Cardiology

## 2021-12-12 DIAGNOSIS — M47816 Spondylosis without myelopathy or radiculopathy, lumbar region: Secondary | ICD-10-CM | POA: Diagnosis not present

## 2021-12-12 IMAGING — MR MR ABDOMEN WO/W CM
17 series · 48 of 48 positions shown · IV contrast (Multihance 19ML)
Comparison: Lumbar spine MRI 12/20/2019

CLINICAL DATA: Right renal mass on recent lumbar spine MRI.

EXAM:
MRI ABDOMEN WITHOUT AND WITH CONTRAST
TECHNIQUE: Multiplanar multisequence MR imaging of the abdomen was performed
both before and after the administration of intravenous contrast.
CONTRAST:  19mL MULTIHANCE GADOBENATE DIMEGLUMINE 529 MG/ML IV SOLN

[Series 2: T2 · coronal · 5.0mm · 1.56mm/px · 1 of 33 slices shown (1 of 3)]
[im 1/33]
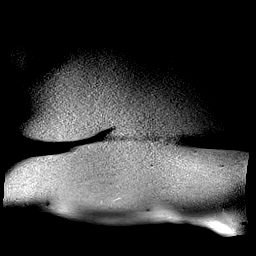

[Series 3: T1 · axial · 3.0mm · 1.22mm/px · z∈[-160,+29]mm · 4 of 128 slices shown]
[im 1/128]
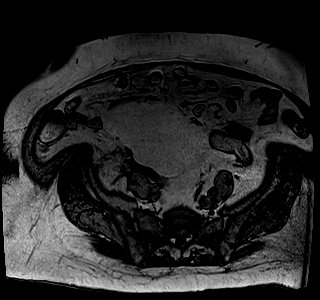
[im 43/128]
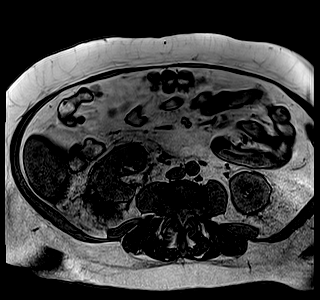
[im 85/128]
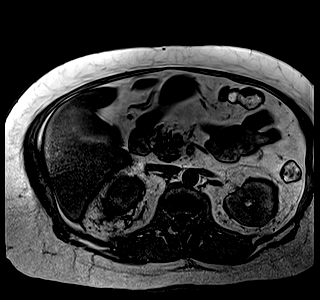
[im 128/128]
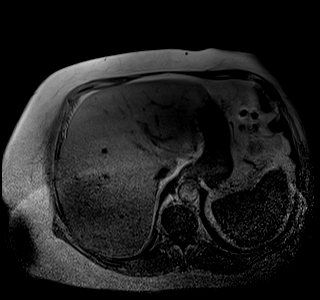

[Series 4: T2 · axial · 5.0mm · 1.48mm/px · z∈[-148,+86]mm · 2 of 40 slices shown (2 of 3)]
[im 1/40]
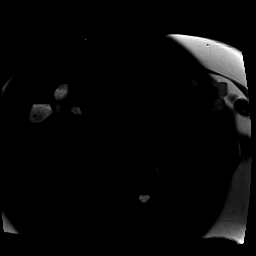
[im 40/40]
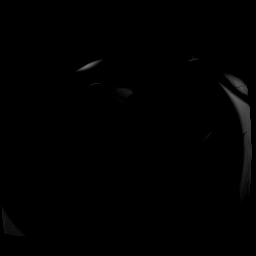

[Series 5: DWI · axial · 5.0mm · 1.42mm/px · z∈[-148,+86]mm · 5 of 120 slices shown (1 of 2)]
[im 1/120]
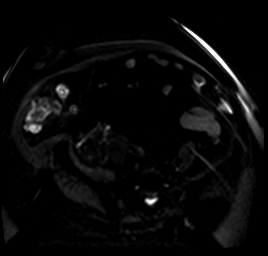
[im 30/120]
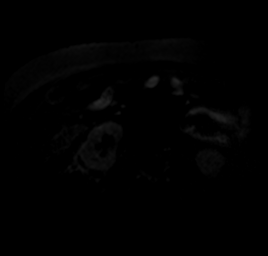
[im 60/120]
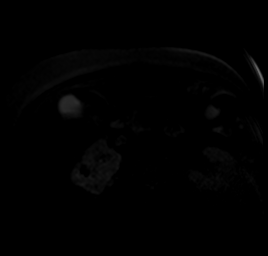
[im 90/120]
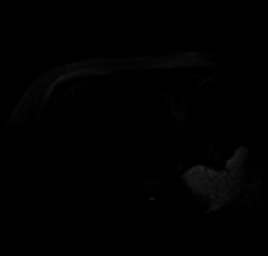
[im 120/120]
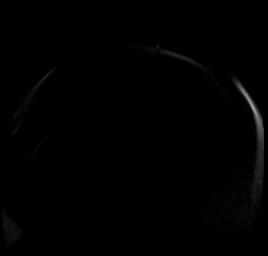

[Series 6: DWI · axial · 5.0mm · 1.42mm/px · z∈[-148,+86]mm · 2 of 40 slices shown (2 of 2)]
[im 1/40]
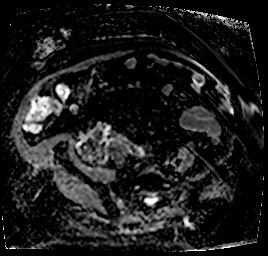
[im 40/40]
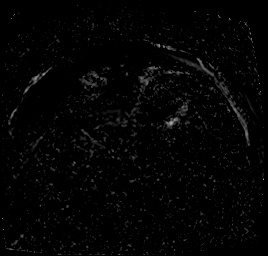

[Series 8: T2 · axial · 6.0mm · 1.22mm/px · z∈[-172,+109]mm · 2 of 40 slices shown (3 of 3)]
[im 1/40]
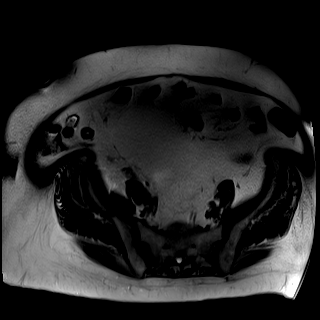
[im 40/40]
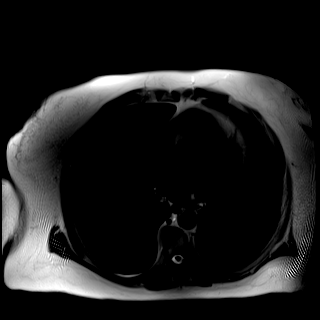

[Series 9: bSSFP · axial · 5.0mm · 1.19mm/px · z∈[-148,+86]mm · 2 of 40 slices shown]
[im 1/40]
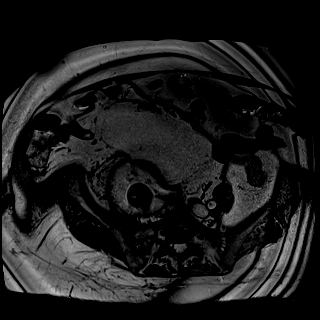
[im 40/40]
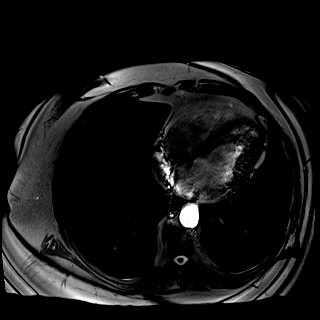

[Series 10: T1 dynamic · axial · non-contrast · 3.0mm · 1.25mm/px · z∈[-150,+87]mm · 3 of 80 slices shown]
[im 1/80]
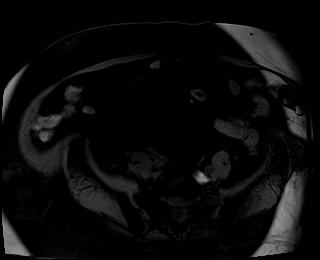
[im 40/80]
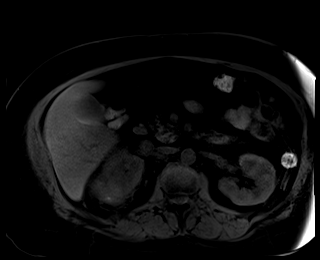
[im 80/80]
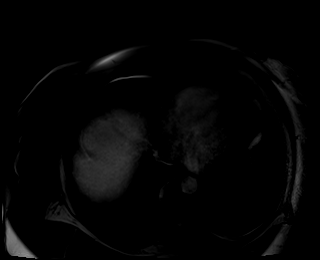

[Series 11: T1 dynamic post-contrast · axial · 3.0mm · 1.25mm/px · z∈[-150,+87]mm · 3 of 80 slices shown (1 of 9)]
[im 1/80]
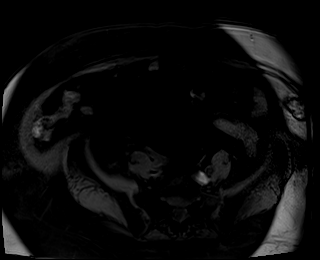
[im 40/80]
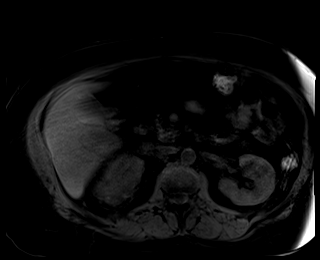
[im 80/80]
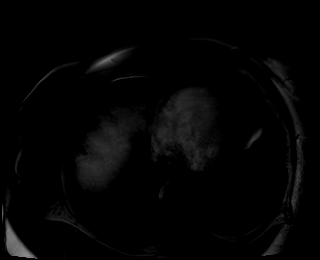

[Series 12: T1 dynamic post-contrast · axial · 3.0mm · 1.25mm/px · z∈[-150,+87]mm · 3 of 80 slices shown (2 of 9)]
[im 1/80]
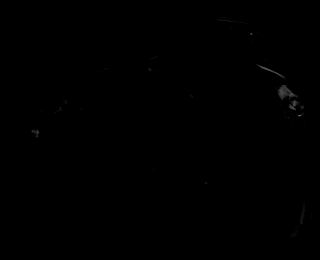
[im 40/80]
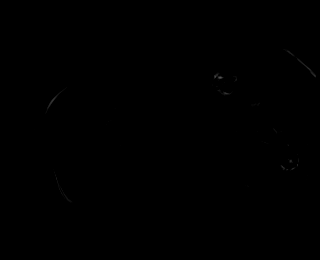
[im 80/80]
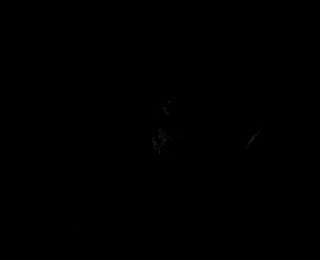

[Series 13: T1 dynamic post-contrast · axial · 3.0mm · 1.25mm/px · z∈[-150,+87]mm · 3 of 80 slices shown (3 of 9)]
[im 1/80]
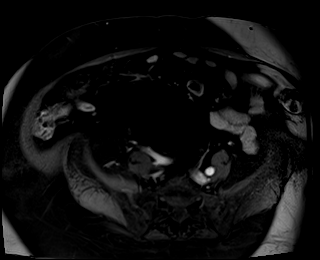
[im 40/80]
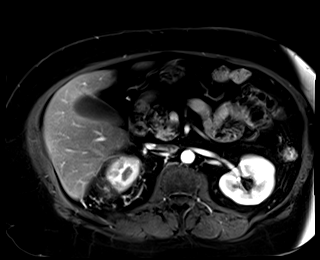
[im 80/80]
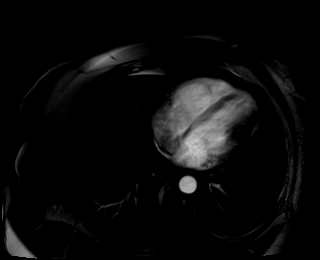

[Series 14: T1 dynamic post-contrast · axial · 3.0mm · 1.25mm/px · z∈[-150,+87]mm · 3 of 80 slices shown (4 of 9)]
[im 1/80]
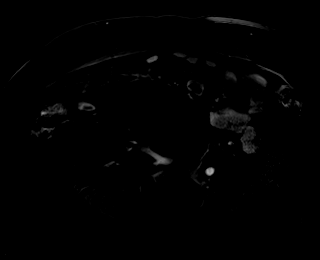
[im 40/80]
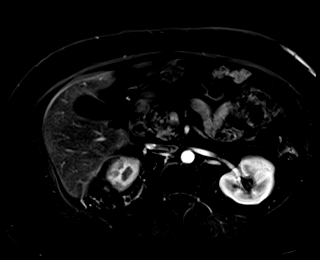
[im 80/80]
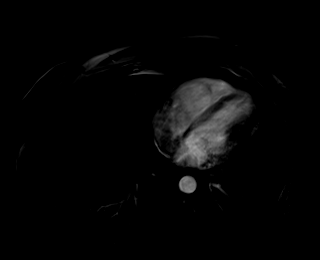

[Series 15: T1 dynamic post-contrast · axial · 3.0mm · 1.25mm/px · z∈[-150,+87]mm · 3 of 80 slices shown (5 of 9)]
[im 1/80]
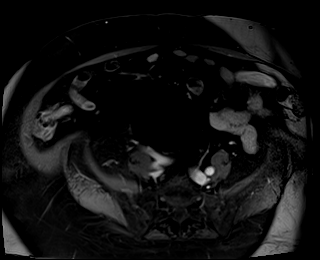
[im 40/80]
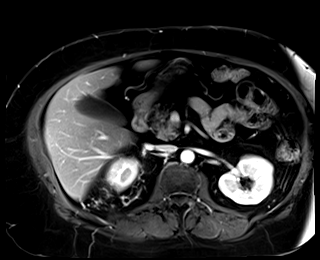
[im 80/80]
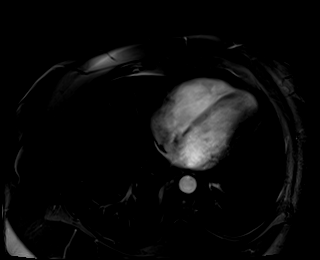

[Series 16: T1 dynamic post-contrast · axial · 3.0mm · 1.25mm/px · z∈[-150,+87]mm · 3 of 80 slices shown (6 of 9)]
[im 1/80]
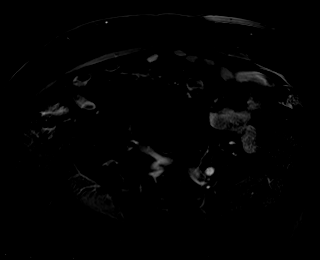
[im 40/80]
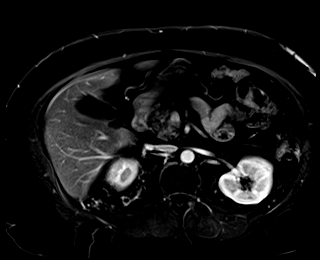
[im 80/80]
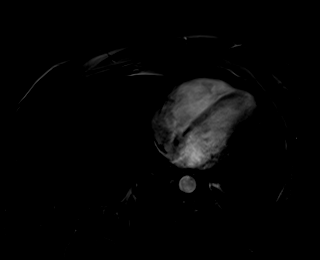

[Series 17: T1 dynamic post-contrast · coronal · 3.0mm · 1.25mm/px · 3 of 80 slices shown (7 of 9)]
[im 1/80]
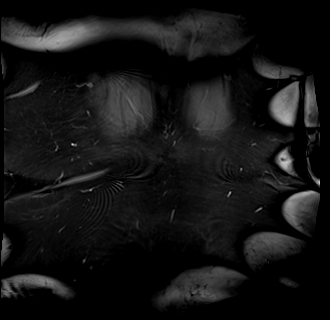
[im 40/80]
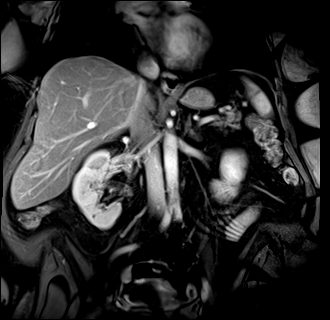
[im 80/80]
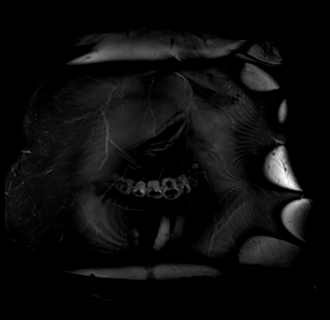

[Series 18: T1 dynamic post-contrast · axial · 3.0mm · 1.25mm/px · z∈[-150,+87]mm · 3 of 80 slices shown (8 of 9)]
[im 1/80]
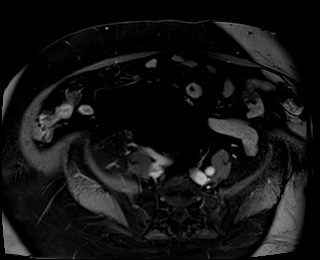
[im 40/80]
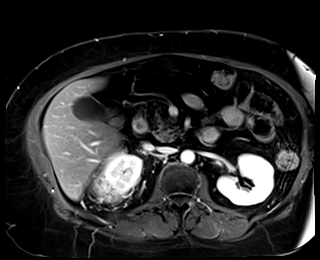
[im 80/80]
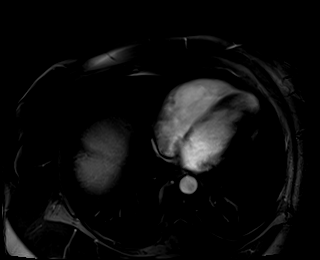

[Series 19: T1 dynamic post-contrast · axial · 3.0mm · 1.25mm/px · z∈[-150,+87]mm · 3 of 80 slices shown (9 of 9)]
[im 1/80]
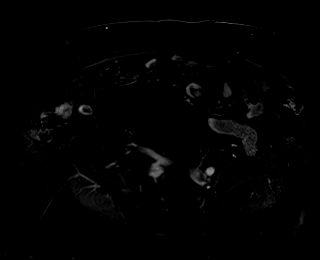
[im 40/80]
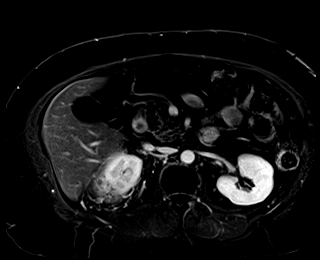
[im 80/80]
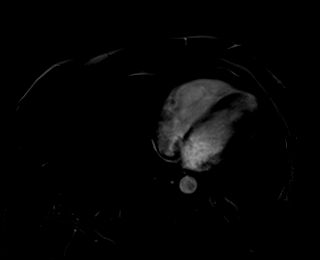

[48 of 48 positions shown; findings below may reference images not displayed]

FINDINGS: Lower chest: Unremarkable.

Hepatobiliary: Diffuse loss of signal intensity on out of phase T1
imaging is compatible with fatty deposition in the liver parenchyma.
11 mm irregular cyst noted posterior right liver There is no
evidence for gallstones, gallbladder wall thickening, or
pericholecystic fluid. No intrahepatic or extrahepatic biliary
dilation.

Pancreas: No focal mass lesion. No dilatation of the main duct. No
intraparenchymal cyst. No peripancreatic edema.

Spleen:  No splenomegaly. No focal mass lesion.

Adrenals/Urinary Tract:  No adrenal nodule or mass.

6.6 x 6.9 x 4.9 cm exophytic mass is identified in the posterior
upper interpolar right kidney. Lesion extends into the posterior lip
of the interpolar right kidney with filling defects in the upper and
interpolar venous anatomy. Dominant filling defect in the upper pole
region of the right renal venous anatomy expands the vessel and
appears to have enhancing components consistent with tumor thrombus.
Thrombus does not extend into the IVC.

There is a second lesion in the extreme inferior pole right kidney
measuring 1.6 x 1.6 cm with heterogeneous enhancement (delayed
postcontrast axial image 72 of series 18 and coronal postcontrast
image 45 of series 17). This is also well demonstrated on coronal T2
image 25 of series 2.

No suspicious enhancing lesion in the left kidney. Very tiny foci of
non enhancement are probably cortical cysts.

Stomach/Bowel: Stomach is unremarkable. No gastric wall thickening.
No evidence of outlet obstruction. Duodenum is normally positioned
as is the ligament of Treitz. No small bowel or colonic dilatation
within the visualized abdomen.

Vascular/Lymphatic: No abdominal aortic aneurysm. There is no
gastrohepatic or hepatoduodenal ligament lymphadenopathy. No
retroperitoneal or mesenteric lymphadenopathy. 9 mm short axis
enhancing lymph node is seen in the posterior right renal hilar
region, adjacent to the psoas muscle (see image 59 of series 15,
concerning for nodal metastasis.

Other:  No intraperitoneal free fluid.

Musculoskeletal: No focal suspicious marrow enhancement within the
visualized bony anatomy.
IMPRESSION: 1. 6.6 x 6.9 x 4.9 cm exophytic mass in the posterior upper
interpolar right kidney that extends into the posterior lip of the
interpolar kidney. Imaging features consistent with renal cell
carcinoma. Thrombus in the right renal vein appears to have
enhancing components suggesting tumor thrombus. No extension of
thrombus into the IVC. Lesion extends towards the liver, but coronal
and axial imaging today suggests preservation of fat plane between
the renal mass and the liver capsule. Although not a dedicated MRA
exam, only a single right renal artery is identified with prominent
vascular collateralization superior to and posterior to the right
kidney.
2. There is a second lesion in the extreme inferior pole right
kidney measuring 1.6 x 1.6 cm and concerning for a second primary
neoplasm.
3. 9 mm short axis enhancing lymph node in the posterior right renal
hilar region, concerning for nodal metastasis.
4. Hepatic steatosis.

## 2021-12-13 DIAGNOSIS — M4310 Spondylolisthesis, site unspecified: Secondary | ICD-10-CM | POA: Diagnosis not present

## 2021-12-15 ENCOUNTER — Ambulatory Visit: Payer: Medicare Other | Attending: Cardiology | Admitting: *Deleted

## 2021-12-15 DIAGNOSIS — I480 Paroxysmal atrial fibrillation: Secondary | ICD-10-CM

## 2021-12-15 DIAGNOSIS — Z7189 Other specified counseling: Secondary | ICD-10-CM

## 2021-12-15 DIAGNOSIS — I4819 Other persistent atrial fibrillation: Secondary | ICD-10-CM

## 2021-12-15 DIAGNOSIS — Z5181 Encounter for therapeutic drug level monitoring: Secondary | ICD-10-CM | POA: Diagnosis not present

## 2021-12-15 LAB — POCT INR: INR: 2.8 (ref 2.0–3.0)

## 2021-12-15 NOTE — Patient Instructions (Signed)
Description   Continue taking 1/2 tablet daily except for 1 tablet on Monday and Friday. Recheck INR in 4 weeks. Stay consistent with greens (2-3 servings per week).  Call our office if you have any questions or if placed on any new medications [phone].

## 2021-12-21 DIAGNOSIS — M4310 Spondylolisthesis, site unspecified: Secondary | ICD-10-CM | POA: Diagnosis not present

## 2021-12-24 ENCOUNTER — Telehealth: Payer: Self-pay | Admitting: Oncology

## 2021-12-24 NOTE — Telephone Encounter (Signed)
Called patient regarding December appointments, left a voicemail. Informed patient to call back regarding upcoming changes.

## 2021-12-28 ENCOUNTER — Ambulatory Visit: Payer: Medicare Other | Admitting: Cardiology

## 2022-01-10 DIAGNOSIS — M4310 Spondylolisthesis, site unspecified: Secondary | ICD-10-CM | POA: Diagnosis not present

## 2022-01-12 DIAGNOSIS — M47816 Spondylosis without myelopathy or radiculopathy, lumbar region: Secondary | ICD-10-CM | POA: Diagnosis not present

## 2022-01-13 ENCOUNTER — Ambulatory Visit: Payer: Medicare Other | Attending: Cardiovascular Disease

## 2022-01-13 DIAGNOSIS — I4819 Other persistent atrial fibrillation: Secondary | ICD-10-CM

## 2022-01-13 DIAGNOSIS — I480 Paroxysmal atrial fibrillation: Secondary | ICD-10-CM | POA: Diagnosis not present

## 2022-01-13 DIAGNOSIS — Z7189 Other specified counseling: Secondary | ICD-10-CM

## 2022-01-13 DIAGNOSIS — Z5181 Encounter for therapeutic drug level monitoring: Secondary | ICD-10-CM

## 2022-01-13 LAB — POCT INR: INR: 4.3 — AB (ref 2.0–3.0)

## 2022-01-13 NOTE — Patient Instructions (Signed)
HOLD TODAY ONLY THEN Continue taking 1/2 tablet daily except for 1 tablet on Monday and Friday. Recheck INR in 2 weeks. Stay consistent with greens (2-3 servings per week).  Call our office if you have any questions or if placed on any new medications [phone].

## 2022-01-19 ENCOUNTER — Inpatient Hospital Stay: Payer: Medicare Other

## 2022-01-19 ENCOUNTER — Other Ambulatory Visit: Payer: Self-pay

## 2022-01-19 ENCOUNTER — Inpatient Hospital Stay: Payer: Medicare Other | Attending: Oncology | Admitting: Oncology

## 2022-01-19 VITALS — BP 160/68 | HR 67 | Temp 97.6°F | Resp 19 | Ht 64.0 in

## 2022-01-19 DIAGNOSIS — Z9221 Personal history of antineoplastic chemotherapy: Secondary | ICD-10-CM | POA: Insufficient documentation

## 2022-01-19 DIAGNOSIS — Z85528 Personal history of other malignant neoplasm of kidney: Secondary | ICD-10-CM | POA: Insufficient documentation

## 2022-01-19 DIAGNOSIS — Z95828 Presence of other vascular implants and grafts: Secondary | ICD-10-CM

## 2022-01-19 DIAGNOSIS — E039 Hypothyroidism, unspecified: Secondary | ICD-10-CM | POA: Insufficient documentation

## 2022-01-19 DIAGNOSIS — Z79899 Other long term (current) drug therapy: Secondary | ICD-10-CM | POA: Diagnosis not present

## 2022-01-19 DIAGNOSIS — Z905 Acquired absence of kidney: Secondary | ICD-10-CM | POA: Insufficient documentation

## 2022-01-19 DIAGNOSIS — D49511 Neoplasm of unspecified behavior of right kidney: Secondary | ICD-10-CM

## 2022-01-19 LAB — CBC WITH DIFFERENTIAL (CANCER CENTER ONLY)
Abs Immature Granulocytes: 0.02 10*3/uL (ref 0.00–0.07)
Basophils Absolute: 0.1 10*3/uL (ref 0.0–0.1)
Basophils Relative: 1 %
Eosinophils Absolute: 0.1 10*3/uL (ref 0.0–0.5)
Eosinophils Relative: 2 %
HCT: 30.6 % — ABNORMAL LOW (ref 36.0–46.0)
Hemoglobin: 10.2 g/dL — ABNORMAL LOW (ref 12.0–15.0)
Immature Granulocytes: 0 %
Lymphocytes Relative: 31 %
Lymphs Abs: 1.6 10*3/uL (ref 0.7–4.0)
MCH: 28.9 pg (ref 26.0–34.0)
MCHC: 33.3 g/dL (ref 30.0–36.0)
MCV: 86.7 fL (ref 80.0–100.0)
Monocytes Absolute: 0.6 10*3/uL (ref 0.1–1.0)
Monocytes Relative: 11 %
Neutro Abs: 2.7 10*3/uL (ref 1.7–7.7)
Neutrophils Relative %: 55 %
Platelet Count: 234 10*3/uL (ref 150–400)
RBC: 3.53 MIL/uL — ABNORMAL LOW (ref 3.87–5.11)
RDW: 13.3 % (ref 11.5–15.5)
WBC Count: 5 10*3/uL (ref 4.0–10.5)
nRBC: 0 % (ref 0.0–0.2)

## 2022-01-19 LAB — CMP (CANCER CENTER ONLY)
ALT: 13 U/L (ref 0–44)
AST: 14 U/L — ABNORMAL LOW (ref 15–41)
Albumin: 3.5 g/dL (ref 3.5–5.0)
Alkaline Phosphatase: 52 U/L (ref 38–126)
Anion gap: 9 (ref 5–15)
BUN: 22 mg/dL (ref 8–23)
CO2: 23 mmol/L (ref 22–32)
Calcium: 9.5 mg/dL (ref 8.9–10.3)
Chloride: 105 mmol/L (ref 98–111)
Creatinine: 0.88 mg/dL (ref 0.44–1.00)
GFR, Estimated: >60 mL/min (ref >60)
Glucose, Bld: 328 mg/dL — ABNORMAL HIGH (ref 70–99)
Potassium: 4.1 mmol/L (ref 3.5–5.1)
Sodium: 137 mmol/L (ref 135–145)
Total Bilirubin: 0.3 mg/dL (ref 0.3–1.2)
Total Protein: 6.2 g/dL — ABNORMAL LOW (ref 6.5–8.1)

## 2022-01-19 LAB — TSH: TSH: 10.28 u[IU]/mL — ABNORMAL HIGH (ref 0.350–4.500)

## 2022-01-19 MED ORDER — SODIUM CHLORIDE 0.9% FLUSH
10.0000 mL | Freq: Once | INTRAVENOUS | Status: AC
  Administered 2022-01-19: 10 mL

## 2022-01-19 MED ORDER — HEPARIN SOD (PORK) LOCK FLUSH 100 UNIT/ML IV SOLN
500.0000 [IU] | INTRAVENOUS | Status: AC | PRN
  Administered 2022-01-19: 500 [IU]

## 2022-01-19 NOTE — Progress Notes (Signed)
Hematology and Oncology Follow Up Visit  Latasha Hodges [id number] Jul 21, 1945 76 y.o. 01/19/2022 1:10 PM Kelton Pillar, MDGriffin, [name], MD   Principle Diagnosis: 18 year old woman with kidney cancer diagnosed in December 2021.  She was found to have T3aN1 clear-cell.    Prior Therapy:  She is status post right robotic assisted laparoscopic radical nephrectomy completed by Dr. Alinda Money on January 29, 2020.  The final pathology showed a 7.0 cm clear cell renal cell carcinoma with tumor extends into the renal vein, renal capsule and renal sinus fat indicating T3a disease with 1 lymph node involvement.  No sarcomatoid or rhabdoid features noted with histological grade of G3.  Pembrolizumab 200 mg every 3 weeks started on March 31, 2020.  She received 400 mg starting with cycle 5 of therapy in May 2022.  Last cycle given on August 11, 2020 due to dermatological toxicity and poor tolerance.  Current therapy: Active surveillance.  Interim History: [name] returns today for a follow-up.  Since last visit, she reports no major changes in her health.  She denies any recent hospitalizations or illnesses.  She denies any chest pain or shortness of breath.  Continues to have issues with dry skin but no skin rashes or lesions.  She still has some residual lower extremity edema.   Medications: Updated on review. Current Outpatient Medications  Medication Sig Dispense Refill   clotrimazole-betamethasone (LOTRISONE) cream APPLY TOPICALLY TO THE AFFECTED AREA TWICE DAILY 180 g 1   clotrimazole-betamethasone (LOTRISONE) cream APPLY TOPICALLY TO THE AFFECTED AREA TWICE DAILY 180 g 1   Dulaglutide (TRULICITY) 1.5 YJ/8.5UD SOPN Inject 1.5 mg into the skin every Thursday.     fenofibrate micronized (LOFIBRA) 200 MG capsule TAKE 1 CAPSULE BY MOUTH DAILY BEFORE AND BREAKFAST 120 capsule 3   flecainide (TAMBOCOR) 50 MG tablet TAKE 1 TABLET(50 MG) BY MOUTH TWICE DAILY 180 tablet 1   levothyroxine  (SYNTHROID) 25 MCG tablet TAKE 1 TABLET(25 MCG) BY MOUTH DAILY BEFORE AND BREAKFAST 90 tablet 3   levothyroxine (SYNTHROID) 75 MCG tablet Take 75 mcg by mouth every morning.     lisinopril (ZESTRIL) 20 MG tablet TAKE 1 TABLET(20 MG) BY MOUTH DAILY 90 tablet 3   magnesium oxide (MAG-OX) 400 (240 Mg) MG tablet Take 400 mg by mouth daily.     metFORMIN (GLUCOPHAGE) 1000 MG tablet Take 1,000 mg by mouth daily with breakfast.  0   metoprolol succinate (TOPROL-XL) 50 MG 24 hr tablet TAKE 1 TABLET(50 MG) BY MOUTH DAILY 90 tablet 3   rosuvastatin (CRESTOR) 40 MG tablet Take 1 tablet (40 mg total) by mouth daily. 90 tablet 3   warfarin (COUMADIN) 5 MG tablet TAKE 1 TABLET BY MOUTH AS DIRECTED BY COUMADIN CLINIC 90 tablet 1   No current facility-administered medications for this visit.     Allergies:  Allergies  Allergen Reactions   Iodinated Contrast Media Hives, Swelling and Rash   No [employer] Food Allergies Rash    Seafood   Penicillins Rash    Childhood reaction.   Sulfa Antibiotics Hives and Rash   Theophyllines Rash      Physical Exam:       Blood pressure (!) 160/68, pulse 67, temperature 97.6 F (36.4 C), temperature source Temporal, resp. rate 19, height '5\' 4"'$  (1.626 m), SpO2 98 %.     ECOG: 1    General appearance: Alert, awake without any distress. Head: Atraumatic without abnormalities Oropharynx: Without any thrush or ulcers. Eyes: No scleral icterus. Lymph nodes: No  lymphadenopathy noted in the cervical, supraclavicular, or axillary nodes Heart:regular rate and rhythm, without any murmurs or gallops.   Lung: Clear to auscultation without any rhonchi, wheezes or dullness to percussion. Abdomin: Soft, nontender without any shifting dullness or ascites. Musculoskeletal: No clubbing or cyanosis. Neurological: No motor or sensory deficits. Skin: No rashes or lesions.              Lab Results: Lab Results  Component Value Date   WBC 5.2  11/11/2021   HGB 10.0 (L) 11/11/2021   HCT 30.6 (L) 11/11/2021   MCV 86.0 11/11/2021   PLT 221 11/11/2021     Chemistry      Component Value Date/Time   NA 136 11/11/2021 1310   NA 136 07/29/2021 1308   K 4.1 11/11/2021 1310   CL 103 11/11/2021 1310   CO2 28 11/11/2021 1310   BUN 28 (H) 11/11/2021 1310   BUN 42 (H) 07/29/2021 1308   CREATININE 0.88 11/11/2021 1310      Component Value Date/Time   CALCIUM 9.0 11/11/2021 1310   ALKPHOS 54 11/11/2021 1310   AST 11 (L) 11/11/2021 1310   ALT 12 11/11/2021 1310   BILITOT 0.3 11/11/2021 1310            Impression and Plan:   76 year old woman with:   1.  Kidney cancer diagnosed in December 2021.  She was found to have T3a N1 clear-cell without any evidence of metastatic disease.  She received brief treatment with Pembrolizumab and was discontinued due to severe dermatological toxicity.  The natural course of this disease and risk of relapse was assessed at this time.  Imaging studies obtained in July 2023 did not show any evidence of metastatic disease at this time.  I have recommended updating her staging scan in the near future and if no evidence of metastatic disease is noted this can be repeated in 6 months.  He is agreeable to proceed with this plan.   2.  IV access: Port-A-Cath currently in use without any complications.  Risks and benefits of removing it were discussed at this time.  If her next scan shows no evidence of disease we will remove her Port-A-Cath.     3.  Immune mediated complications: She developed a severe dermatological toxicity which is improved at this time.  4.  Hypothyroidism: Related to immunotherapy and currently on thyroid replacement.  5.  Follow-up: She will return in the next 4 weeks for evaluation after her CT scan in 6 months after that.   30  minutes were dedicated to this encounter.  The time was spent on updating disease status, treatment choices and outlining future plan of care  review.   Zola Button, MD 12/7/20231:10 PM

## 2022-01-25 DIAGNOSIS — M25551 Pain in right hip: Secondary | ICD-10-CM | POA: Diagnosis not present

## 2022-01-26 ENCOUNTER — Other Ambulatory Visit: Payer: Self-pay | Admitting: Family

## 2022-01-26 NOTE — Telephone Encounter (Signed)
Patient of Dr. Turner. Please review for refill. Thank you!  

## 2022-01-31 ENCOUNTER — Telehealth: Payer: Self-pay | Admitting: Internal Medicine

## 2022-01-31 ENCOUNTER — Ambulatory Visit: Payer: Medicare Other | Attending: Cardiology | Admitting: *Deleted

## 2022-01-31 DIAGNOSIS — I480 Paroxysmal atrial fibrillation: Secondary | ICD-10-CM

## 2022-01-31 DIAGNOSIS — Z7189 Other specified counseling: Secondary | ICD-10-CM | POA: Diagnosis not present

## 2022-01-31 DIAGNOSIS — I4819 Other persistent atrial fibrillation: Secondary | ICD-10-CM

## 2022-01-31 DIAGNOSIS — Z5181 Encounter for therapeutic drug level monitoring: Secondary | ICD-10-CM

## 2022-01-31 LAB — PROTIME-INR
INR: 7 (ref 0.9–1.2)
Prothrombin Time: 65.7 s — ABNORMAL HIGH (ref 9.1–12.0)

## 2022-01-31 LAB — POCT INR: INR: 7.5 — AB (ref 2.0–3.0)

## 2022-01-31 NOTE — Telephone Encounter (Signed)
Called by lab regarding INR of 7.0. Called patient and she reported she had been instructed to hold warfarin for two days. No additional intervention completed.

## 2022-02-01 NOTE — Patient Instructions (Addendum)
Description   PT AWARE TO HOLD WARFARIN DOSE TODAY AND TOMORROW. SINCE LAB DRAWN LAB LATE RESULT WILL COME BACK AFTER CLINIC CLOSES. PT AWARE TO REPORT TO ER WITH ANY BLEEDING, FALLS, OR ACCIDENTS.  02/01/22 at 1257pm spoke with pt and advised not to take any warfarin Wednesday and no warfarin Thursday (held Tuesday dose) then start taking 1/2 tablet daily except for 1 tablet on Monday. Recheck INR in 1 week.  Stay consistent with greens (2-3 servings per week).  Call our office if you have any questions or if placed on any new medications [phone].  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

## 2022-02-08 ENCOUNTER — Ambulatory Visit: Payer: Medicare Other

## 2022-02-15 ENCOUNTER — Ambulatory Visit (HOSPITAL_COMMUNITY)
Admission: RE | Admit: 2022-02-15 | Discharge: 2022-02-15 | Disposition: A | Discharge status: Home / Self Care | Payer: Medicare Other | Source: Ambulatory Visit | Attending: Oncology | Admitting: Oncology

## 2022-02-15 DIAGNOSIS — D49511 Neoplasm of unspecified behavior of right kidney: Secondary | ICD-10-CM | POA: Diagnosis not present

## 2022-02-15 DIAGNOSIS — J439 Emphysema, unspecified: Secondary | ICD-10-CM | POA: Diagnosis not present

## 2022-02-15 DIAGNOSIS — R918 Other nonspecific abnormal finding of lung field: Secondary | ICD-10-CM | POA: Diagnosis not present

## 2022-02-15 DIAGNOSIS — C649 Malignant neoplasm of unspecified kidney, except renal pelvis: Secondary | ICD-10-CM | POA: Diagnosis not present

## 2022-02-16 ENCOUNTER — Ambulatory Visit: Payer: Medicare Other | Attending: Cardiology

## 2022-02-16 DIAGNOSIS — I4819 Other persistent atrial fibrillation: Secondary | ICD-10-CM | POA: Diagnosis not present

## 2022-02-16 DIAGNOSIS — I480 Paroxysmal atrial fibrillation: Secondary | ICD-10-CM | POA: Diagnosis not present

## 2022-02-16 DIAGNOSIS — Z7189 Other specified counseling: Secondary | ICD-10-CM

## 2022-02-16 DIAGNOSIS — Z5181 Encounter for therapeutic drug level monitoring: Secondary | ICD-10-CM | POA: Diagnosis not present

## 2022-02-16 LAB — POCT INR: INR: 3.4 — AB (ref 2.0–3.0)

## 2022-02-16 NOTE — Patient Instructions (Signed)
Description   Hold today's dose and then START taking 1/2 tablet daily except for 1 tablet on Monday.  Recheck INR in 2 weeks.   Stay consistent with greens (2-3 servings per week).  Call our office if you have any questions or if placed on any new medications [phone].

## 2022-02-17 ENCOUNTER — Other Ambulatory Visit: Payer: Self-pay

## 2022-02-17 ENCOUNTER — Inpatient Hospital Stay: Payer: Medicare Other | Attending: Oncology | Admitting: Oncology

## 2022-02-17 ENCOUNTER — Inpatient Hospital Stay: Payer: Medicare Other

## 2022-02-17 ENCOUNTER — Ambulatory Visit: Payer: Medicare Other | Admitting: Oncology

## 2022-02-17 VITALS — BP 183/67 | HR 64 | Temp 98.1°F | Resp 16

## 2022-02-17 DIAGNOSIS — D49511 Neoplasm of unspecified behavior of right kidney: Secondary | ICD-10-CM | POA: Diagnosis not present

## 2022-02-17 DIAGNOSIS — Z85528 Personal history of other malignant neoplasm of kidney: Secondary | ICD-10-CM | POA: Insufficient documentation

## 2022-02-17 DIAGNOSIS — Z905 Acquired absence of kidney: Secondary | ICD-10-CM | POA: Insufficient documentation

## 2022-02-17 DIAGNOSIS — E039 Hypothyroidism, unspecified: Secondary | ICD-10-CM | POA: Diagnosis not present

## 2022-02-17 DIAGNOSIS — Z79890 Hormone replacement therapy (postmenopausal): Secondary | ICD-10-CM | POA: Diagnosis not present

## 2022-02-17 LAB — CMP (CANCER CENTER ONLY)
ALT: 8 U/L (ref 0–44)
AST: 12 U/L — ABNORMAL LOW (ref 15–41)
Albumin: 3.6 g/dL (ref 3.5–5.0)
Alkaline Phosphatase: 65 U/L (ref 38–126)
Anion gap: 6 (ref 5–15)
BUN: 18 mg/dL (ref 8–23)
CO2: 27 mmol/L (ref 22–32)
Calcium: 9.6 mg/dL (ref 8.9–10.3)
Chloride: 104 mmol/L (ref 98–111)
Creatinine: 0.84 mg/dL (ref 0.44–1.00)
GFR, Estimated: >60 mL/min (ref >60)
Glucose, Bld: 212 mg/dL — ABNORMAL HIGH (ref 70–99)
Potassium: 4 mmol/L (ref 3.5–5.1)
Sodium: 137 mmol/L (ref 135–145)
Total Bilirubin: 0.3 mg/dL (ref 0.3–1.2)
Total Protein: 6.3 g/dL — ABNORMAL LOW (ref 6.5–8.1)

## 2022-02-17 LAB — CBC WITH DIFFERENTIAL (CANCER CENTER ONLY)
Abs Immature Granulocytes: 0.03 10*3/uL (ref 0.00–0.07)
Basophils Absolute: 0 10*3/uL (ref 0.0–0.1)
Basophils Relative: 1 %
Eosinophils Absolute: 0.3 10*3/uL (ref 0.0–0.5)
Eosinophils Relative: 5 %
HCT: 29.9 % — ABNORMAL LOW (ref 36.0–46.0)
Hemoglobin: 9.6 g/dL — ABNORMAL LOW (ref 12.0–15.0)
Immature Granulocytes: 1 %
Lymphocytes Relative: 29 %
Lymphs Abs: 1.6 10*3/uL (ref 0.7–4.0)
MCH: 27.7 pg (ref 26.0–34.0)
MCHC: 32.1 g/dL (ref 30.0–36.0)
MCV: 86.4 fL (ref 80.0–100.0)
Monocytes Absolute: 0.6 10*3/uL (ref 0.1–1.0)
Monocytes Relative: 11 %
Neutro Abs: 2.9 10*3/uL (ref 1.7–7.7)
Neutrophils Relative %: 53 %
Platelet Count: 264 10*3/uL (ref 150–400)
RBC: 3.46 MIL/uL — ABNORMAL LOW (ref 3.87–5.11)
RDW: 13.9 % (ref 11.5–15.5)
WBC Count: 5.4 10*3/uL (ref 4.0–10.5)
nRBC: 0 % (ref 0.0–0.2)

## 2022-02-17 LAB — TSH: TSH: 20.63 u[IU]/mL — ABNORMAL HIGH (ref 0.350–4.500)

## 2022-02-17 NOTE — Progress Notes (Signed)
Hematology and Oncology Follow Up Visit  Latasha Hodges [id number] 1946-01-01 77 y.o. 02/17/2022 2:23 PM Kelton Pillar, MDGriffin, [name], MD   Principle Diagnosis: 77 year old woman with T3aN1 clear-cell renal cell carcinoma diagnosed in December 2021.     Prior Therapy:  She is status post right robotic assisted laparoscopic radical nephrectomy completed by Dr. Alinda Money on January 29, 2020.  The final pathology showed a 7.0 cm clear cell renal cell carcinoma with tumor extends into the renal vein, renal capsule and renal sinus fat indicating T3a disease with 1 lymph node involvement.  No sarcomatoid or rhabdoid features noted with histological grade of G3.  Pembrolizumab 200 mg every 3 weeks started on March 31, 2020.  She received 400 mg starting with cycle 5 of therapy in May 2022.  Last cycle given on August 11, 2020 due to dermatological toxicity and poor tolerance.  Current therapy: Active surveillance.  Interim History: Latasha Hodges is here for a return evaluation.  Since the last visit, she reports no major changes in her health.  She has reported persistent lower extremity edema and hip pain which has not changed dramatically.  She denies any falls or syncope.  She denies shortness of breath or difficulty breathing.  She denies any hospitalizations or illnesses.   Medications: Reviewed without changes. Current Outpatient Medications  Medication Sig Dispense Refill   clotrimazole-betamethasone (LOTRISONE) cream APPLY TOPICALLY TO THE AFFECTED AREA TWICE DAILY 180 g 1   clotrimazole-betamethasone (LOTRISONE) cream APPLY TOPICALLY TO THE AFFECTED AREA TWICE DAILY 180 g 1   Dulaglutide (TRULICITY) 1.5 LN/9.8XQ SOPN Inject 1.5 mg into the skin every Thursday.     fenofibrate micronized (LOFIBRA) 200 MG capsule TAKE 1 CAPSULE BY MOUTH DAILY BEFORE AND BREAKFAST 120 capsule 3   flecainide (TAMBOCOR) 50 MG tablet TAKE 1 TABLET(50 MG) BY MOUTH TWICE DAILY 180 tablet 1   levothyroxine  (SYNTHROID) 25 MCG tablet TAKE 1 TABLET(25 MCG) BY MOUTH DAILY BEFORE AND BREAKFAST 90 tablet 3   levothyroxine (SYNTHROID) 75 MCG tablet Take 75 mcg by mouth every morning.     lisinopril (ZESTRIL) 20 MG tablet TAKE 1 TABLET(20 MG) BY MOUTH DAILY 90 tablet 2   magnesium oxide (MAG-OX) 400 (240 Mg) MG tablet Take 400 mg by mouth daily.     metFORMIN (GLUCOPHAGE) 1000 MG tablet Take 1,000 mg by mouth daily with breakfast.  0   metoprolol succinate (TOPROL-XL) 50 MG 24 hr tablet TAKE 1 TABLET(50 MG) BY MOUTH DAILY 90 tablet 3   rosuvastatin (CRESTOR) 40 MG tablet Take 1 tablet (40 mg total) by mouth daily. 90 tablet 3   warfarin (COUMADIN) 5 MG tablet TAKE 1 TABLET BY MOUTH AS DIRECTED BY COUMADIN CLINIC 90 tablet 1   No current facility-administered medications for this visit.     Allergies:  Allergies  Allergen Reactions   Iodinated Contrast Media Hives, Swelling and Rash   No Healthtouch Food Allergies Rash    Seafood   Penicillins Rash    Childhood reaction.   Sulfa Antibiotics Hives and Rash   Theophyllines Rash      Physical Exam:      Blood pressure (!) 183/67, pulse 64, temperature 98.1 F (36.7 C), temperature source Temporal, resp. rate 16, SpO2 100 %.       ECOG: 1   General appearance: Comfortable appearing without any discomfort Head: Normocephalic without any trauma Oropharynx: Mucous membranes are moist and pink without any thrush or ulcers. Eyes: Pupils are equal and round reactive to light.  Lymph nodes: No cervical, supraclavicular, inguinal or axillary lymphadenopathy.   Heart:regular rate and rhythm.  S1 and S2 without leg edema. Lung: Clear without any rhonchi or wheezes.  No dullness to percussion. Abdomin: Soft, nontender, nondistended with good bowel sounds.  No hepatosplenomegaly. Musculoskeletal: No joint deformity or effusion.  Full range of motion noted. Neurological: No deficits noted on motor, sensory and deep tendon reflex exam. Skin:  No petechial rash or dryness.  Appeared moist.                Lab Results: Lab Results  Component Value Date   WBC 5.4 02/17/2022   HGB 9.6 (L) 02/17/2022   HCT 29.9 (L) 02/17/2022   MCV 86.4 02/17/2022   PLT 264 02/17/2022     Chemistry      Component Value Date/Time   NA 137 01/19/2022 1327   NA 136 07/29/2021 1308   K 4.1 01/19/2022 1327   CL 105 01/19/2022 1327   CO2 23 01/19/2022 1327   BUN 22 01/19/2022 1327   BUN 42 (H) 07/29/2021 1308   CREATININE 0.88 01/19/2022 1327      Component Value Date/Time   CALCIUM 9.5 01/19/2022 1327   ALKPHOS 52 01/19/2022 1327   AST 14 (L) 01/19/2022 1327   ALT 13 01/19/2022 1327   BILITOT 0.3 01/19/2022 1327       IMPRESSION: 1. No evidence of recurrent or metastatic disease. 2. Scattered tiny pulmonary nodules are unchanged. Recommend continued attention on follow-up. 3. Large lipoma in the right mid abdomen. 4. Aortic atherosclerosis (ICD10-I70.0). Coronary artery calcification. 5.  Emphysema (ICD10-J43.9).     Impression and Plan:   77 year old woman with:   1.  T3a N1 clear-cell renal cell carcinoma diagnosed in 2021.    The natural course of this disease was reviewed at this time the risk of relapse was assessed.  CT scan obtained in January 2024 showed no evidence of metastatic disease.  At this time I recommended continued active surveillance with repeat imaging studies every 6 months for the next year and annually after that.  She is agreeable with this plan.   2.  IV access: Risks and benefits of Port-A-Cath removal were discussed today.  After discussion she opted to keep   3.  Hypothyroidism: She remains on thyroid replacement with normalization of her T4 level as well as improvement of her TSH.  5.  Follow-up: In 6 months for the next evaluation   30  minutes were minutes on this visit.  The time was dedicated to updating disease status, treatment choices and outlining future plan of care  discussion.   Zola Button, MD 1/5/20242:23 PM

## 2022-02-23 ENCOUNTER — Encounter: Payer: Self-pay | Admitting: Oncology

## 2022-02-23 DIAGNOSIS — M4316 Spondylolisthesis, lumbar region: Secondary | ICD-10-CM | POA: Diagnosis not present

## 2022-02-24 DIAGNOSIS — M1611 Unilateral primary osteoarthritis, right hip: Secondary | ICD-10-CM | POA: Diagnosis not present

## 2022-02-28 ENCOUNTER — Encounter: Payer: Self-pay | Admitting: Oncology

## 2022-03-01 ENCOUNTER — Ambulatory Visit: Payer: Medicare Other | Attending: Cardiology

## 2022-03-01 DIAGNOSIS — Z7189 Other specified counseling: Secondary | ICD-10-CM

## 2022-03-01 DIAGNOSIS — I4819 Other persistent atrial fibrillation: Secondary | ICD-10-CM | POA: Diagnosis not present

## 2022-03-01 DIAGNOSIS — I480 Paroxysmal atrial fibrillation: Secondary | ICD-10-CM | POA: Diagnosis not present

## 2022-03-01 DIAGNOSIS — Z5181 Encounter for therapeutic drug level monitoring: Secondary | ICD-10-CM | POA: Diagnosis not present

## 2022-03-01 LAB — POCT INR: INR: 2.1 (ref 2.0–3.0)

## 2022-03-01 NOTE — Patient Instructions (Signed)
Description   Take 1 tablet today and then continue taking 1/2 tablet daily except for 1 tablet on Monday.  Recheck INR in 3 weeks.   Stay consistent with greens (2-3 servings per week).  Call our office if you have any questions or if placed on any new medications [phone].

## 2022-03-11 DIAGNOSIS — M25551 Pain in right hip: Secondary | ICD-10-CM | POA: Diagnosis not present

## 2022-03-20 ENCOUNTER — Other Ambulatory Visit: Payer: Self-pay | Admitting: Oncology

## 2022-03-22 ENCOUNTER — Ambulatory Visit: Payer: Medicare Other | Attending: Cardiovascular Disease | Admitting: *Deleted

## 2022-03-22 DIAGNOSIS — I480 Paroxysmal atrial fibrillation: Secondary | ICD-10-CM | POA: Diagnosis not present

## 2022-03-22 DIAGNOSIS — Z7189 Other specified counseling: Secondary | ICD-10-CM

## 2022-03-22 DIAGNOSIS — Z5181 Encounter for therapeutic drug level monitoring: Secondary | ICD-10-CM

## 2022-03-22 DIAGNOSIS — I4819 Other persistent atrial fibrillation: Secondary | ICD-10-CM

## 2022-03-22 LAB — POCT INR: INR: 2.8 (ref 2.0–3.0)

## 2022-03-22 NOTE — Patient Instructions (Signed)
Description   Continue taking warfarin 1/2 tablet daily except for 1 tablet on Monday.  Recheck INR in 4 weeks.  Stay consistent with greens (2-3 servings per week).  Call our office if you have any questions or if placed on any new medications [phone].

## 2022-03-23 DIAGNOSIS — C641 Malignant neoplasm of right kidney, except renal pelvis: Secondary | ICD-10-CM | POA: Diagnosis not present

## 2022-03-23 DIAGNOSIS — E118 Type 2 diabetes mellitus with unspecified complications: Secondary | ICD-10-CM | POA: Diagnosis not present

## 2022-03-23 DIAGNOSIS — D631 Anemia in chronic kidney disease: Secondary | ICD-10-CM | POA: Diagnosis not present

## 2022-03-23 DIAGNOSIS — R609 Edema, unspecified: Secondary | ICD-10-CM | POA: Diagnosis not present

## 2022-03-23 DIAGNOSIS — N182 Chronic kidney disease, stage 2 (mild): Secondary | ICD-10-CM | POA: Diagnosis not present

## 2022-03-23 DIAGNOSIS — I129 Hypertensive chronic kidney disease with stage 1 through stage 4 chronic kidney disease, or unspecified chronic kidney disease: Secondary | ICD-10-CM | POA: Diagnosis not present

## 2022-03-23 DIAGNOSIS — N2581 Secondary hyperparathyroidism of renal origin: Secondary | ICD-10-CM | POA: Diagnosis not present

## 2022-03-23 DIAGNOSIS — N390 Urinary tract infection, site not specified: Secondary | ICD-10-CM | POA: Diagnosis not present

## 2022-03-31 ENCOUNTER — Ambulatory Visit: Payer: Medicare Other | Attending: Cardiology | Admitting: Cardiology

## 2022-03-31 ENCOUNTER — Encounter: Payer: Self-pay | Admitting: Cardiology

## 2022-03-31 VITALS — BP 140/56 | HR 60 | Ht 64.0 in | Wt 215.8 lb

## 2022-03-31 DIAGNOSIS — I4819 Other persistent atrial fibrillation: Secondary | ICD-10-CM

## 2022-03-31 DIAGNOSIS — L299 Pruritus, unspecified: Secondary | ICD-10-CM

## 2022-03-31 DIAGNOSIS — I10 Essential (primary) hypertension: Secondary | ICD-10-CM | POA: Diagnosis not present

## 2022-03-31 DIAGNOSIS — I4510 Unspecified right bundle-branch block: Secondary | ICD-10-CM

## 2022-03-31 DIAGNOSIS — I351 Nonrheumatic aortic (valve) insufficiency: Secondary | ICD-10-CM | POA: Diagnosis not present

## 2022-03-31 DIAGNOSIS — I2510 Atherosclerotic heart disease of native coronary artery without angina pectoris: Secondary | ICD-10-CM

## 2022-03-31 DIAGNOSIS — E7800 Pure hypercholesterolemia, unspecified: Secondary | ICD-10-CM | POA: Diagnosis not present

## 2022-03-31 DIAGNOSIS — I2584 Coronary atherosclerosis due to calcified coronary lesion: Secondary | ICD-10-CM

## 2022-03-31 NOTE — Addendum Note (Signed)
Addended by: Joni Reining on: 03/31/2022 03:29 PM   Modules accepted: Orders

## 2022-03-31 NOTE — Progress Notes (Signed)
Date:  03/31/2022   ID:  [name], DOB Feb 27, 1945, MRN [id number]  PCP:  Kelton Pillar, MD  Cardiologist:  Fransico Him, MD  Electrophysiologist:  None   Chief Complaint:  PAF, HTN, DM, RBBB  History of Present Illness:    Latasha Hodges is a 77 y.o. female with a hx of persistent atrial fibrillation on chronic anticoagulation, chronic RBBB, DM, mild aortic insufficiency coronary artery calcifications and kidney cancer status post right nephrectomy and chemotherapy with CKD stage II with coronary artery calcium score 50 and HTN.    She had a nuclear stress test done 7/23 for flecainide showed no ischemia.  2D echo 6/23 showed normal LVF EF 60-65% with mild AR.  She is here today for followup and is doing well.  She denies any chest pain or pressure, SOB, DOE, PND, orthopnea, dizziness, palpitations or syncope. She occasionally has some mild LE edema.  She is compliant with her meds and is tolerating meds with no SE.    Prior CV studies:   The following studies were reviewed today:  Outside labs from [facility]  Past Medical History:  Diagnosis Date   Acquired solitary kidney    left side;   s/p right nephrectomy 12/ 2021   Agatston coronary artery calcium score less than 100    Coronary calcium score was 27 which is low at 54th percentile for age and sex matched controls.   Anticoagulated on Coumadin    long-term;  managed by cardiology   Aortic atherosclerosis (HCC)    Arthritis    Chronic kidney disease (CKD), stage III (moderate) (HCC)    Coronary artery calcification    Hyperlipidemia, mixed    Hypertension    Left wrist fracture    Malignant neoplasm of kidney, right ([place]) 01/2020   oncologist--- dr Alen Blew;  dx 12/ 2021  Stage IIIa,  Clear cell type;  01-29-2020  s/p robotic right radical nephrectomy (one posisitve node);  chemo 03-31-2020 to  08-11-2020   Mild aortic insufficiency    Persistent atrial fibrillation [facility])    cardiologist--- dr t. Lavonya Hoerner;  nuclear  sress test-- 08-15-2017 low risk normal perfusion, nuclear ef 61%;  event monitor 12-18-2017 in epic   RBBB (right bundle branch block)    chronic   Type 2 diabetes mellitus ([place])    Past Surgical History:  Procedure Laterality Date   COLONOSCOPY  12/29/2010   Procedure: COLONOSCOPY;  Surgeon: Owens Loffler, MD;  Location: [facility];  Service: Endoscopy;  Laterality: N/A;   HARDWARE REMOVAL Left 05/02/2021   Procedure: left wrist removal of hardware- dorsal spanning plate;  Surgeon: [name], MD;  Location: [place];  Service: Orthopedics;  Laterality: Left;   IR IMAGING GUIDED PORT INSERTION  07/08/2020   LUMBAR LAMINECTOMY/DECOMPRESSION MICRODISCECTOMY Left 07/14/2020   Procedure: Laminectomy and Foraminotomy - Lumbar three-Lumbar four- Lumbar four-Lumbar five - left with sublaminar decompression;  Surgeon: Eustace Moore, MD;  Location: [place];  Service: Neurosurgery;  Laterality: Left;   ORIF WRIST FRACTURE Left 02/10/2021   Procedure: OPEN REDUCTION INTERNAL FIXATION (ORIF) WRIST FRACTURE;  Surgeon: [name], MD;  Location: [facility];  Service: Orthopedics;  Laterality: Left;  with MAC   ROBOT ASSISTED LAPAROSCOPIC NEPHRECTOMY N/A 01/29/2020   Procedure: XI ROBOTIC ASSISTED LAPAROSCOPIC RADICAL NEPHRECTOMY;  Surgeon: Raynelle Bring, MD;  Location: [facility];  Service: Urology;  Laterality: N/A;   TUBAL LIGATION  1980     Current Meds  Medication Sig   Dulaglutide (TRULICITY) 1.5  MG/0.5ML SOPN Inject 1.5 mg into the skin every Thursday.   fenofibrate micronized (LOFIBRA) 200 MG capsule TAKE 1 CAPSULE BY MOUTH DAILY BEFORE AND BREAKFAST   flecainide (TAMBOCOR) 50 MG tablet TAKE 1 TABLET(50 MG) BY MOUTH TWICE DAILY   levothyroxine (SYNTHROID) 25 MCG tablet TAKE 1 TABLET(25 MCG) BY MOUTH DAILY BEFORE AND BREAKFAST   lisinopril (ZESTRIL) 20 MG tablet TAKE 1 TABLET(20 MG) BY MOUTH DAILY   magnesium oxide (MAG-OX) 400 (240 Mg) MG tablet Take 400 mg by mouth daily.    metFORMIN (GLUCOPHAGE) 1000 MG tablet Take 1,000 mg by mouth daily with breakfast.   metoprolol succinate (TOPROL-XL) 50 MG 24 hr tablet TAKE 1 TABLET(50 MG) BY MOUTH DAILY   rosuvastatin (CRESTOR) 40 MG tablet Take 1 tablet (40 mg total) by mouth daily.   warfarin (COUMADIN) 5 MG tablet TAKE 1 TABLET BY MOUTH AS DIRECTED BY COUMADIN CLINIC     Allergies:   Iodinated contrast media, No healthtouch food allergies, Penicillins, Sulfa antibiotics, and Theophyllines   Social History   Tobacco Use   Smoking status: Former    Types: Cigarettes    Quit date: 12/28/1981    Years since quitting: 40.2   Smokeless tobacco: Never  Vaping Use   Vaping Use: Never used  Substance Use Topics   Alcohol use: Yes    Comment: occas.   Drug use: No     Family Hx: The patient's family history includes Cancer in her mother; Colonic polyp in her cousin; Diabetes in her father; Hypertension in her father. There is no history of Colon cancer.  ROS:   Please see the history of present illness.     All other systems reviewed and are negative.   Labs/Other Tests and Data Reviewed:    Recent Labs: 07/13/2021: NT-Pro BNP 98 07/21/2021: Magnesium 2.1 02/17/2022: ALT 8; BUN 18; Creatinine 0.84; Hemoglobin 9.6; Platelet Count 264; Potassium 4.0; Sodium 137; TSH 20.630   Recent Lipid Panel Lab Results  Component Value Date/Time   CHOL 124 09/09/2021 01:51 PM   CHOL 152 05/12/2021 12:00 AM   TRIG 195 (H) 09/09/2021 01:51 PM   HDL 44 09/09/2021 01:51 PM   HDL 48 05/12/2021 12:00 AM   CHOLHDL 2.8 09/09/2021 01:51 PM   LDLCALC 41 09/09/2021 01:51 PM   LDLCALC 69 05/12/2021 12:00 AM   LDLDIRECT 69.0 08/12/2014 08:05 AM    Wt Readings from Last 3 Encounters:  03/31/22 215 lb 12.8 oz (97.9 kg)  09/16/21 222 lb 1.6 oz (100.7 kg)  09/05/21 205 lb (93 kg)     Objective:    Vital Signs:  BP (!) 140/56   Pulse 60   Ht 5' 4"$  (1.626 m)   Wt 215 lb 12.8 oz (97.9 kg)   SpO2 96%   BMI 37.04 kg/m   GEN:  Well nourished, well developed in no acute distress HEENT: Normal NECK: No JVD; No carotid bruits LYMPHATICS: No lymphadenopathy CARDIAC:RRR, no murmurs, rubs, gallops RESPIRATORY:  Clear to auscultation without rales, wheezing or rhonchi  ABDOMEN: Soft, non-tender, non-distended MUSCULOSKELETAL:  No edema; No deformity  SKIN: Warm and dry NEUROLOGIC:  Alert and oriented x 3 PSYCHIATRIC:  Normal affect   EKG performed today and demonstrates NSR with RBBB  ASSESSMENT & PLAN:    1.  Persistent atrial fibrillation -Remains in normal sinus rhythm and denies any palpitations -She has not had any problems on warfarin -Continue prescription drug management with flecainide 50 mg twice daily, Toprol-XL 50 mg daily and  warfarin with as needed refills -not on DOAC due to cost -Her INRs are followed in Coumadin clinic -I have personally reviewed and interpreted outside labs performed by patient's PCP which showed hemoglobin 9.6 on 02/17/2022 -QRS duration had slowly increased in the past year from 417>>471mec and QTC 152>>168mc -will refer to afib clinic to make sure ok to remain on Flecainide with intervals starting to prolong  2.  HTN -Blood pressure is controlled on exam  -Continue prescription drug management with lisinopril 20 mg daily and Toprol-XL 50 mg daily with as needed refills -I have personally reviewed and interpreted outside labs performed by patient's PCP which showed serum creatinine 0.84 and potassium 4 on 02/17/2022  3.  Chronic RBBB  -no ischemia on nuclear stress test 2019 -She has not had any and all symptoms  4.  HLD -LDL goal < 70 in setting of DM an coronary artery calcium -I have personally reviewed and interpreted outside labs performed by patient's PCP which showed LDL 41 HDL 44 on 09/09/2021 -Continue prescription drug management with resume Crestor40 mg daily and fenofibrate 200 mg daily -she has been having a lot of itching lately so I will get a hepatic  panel  5.  Coronary artery calcifications -Coronary calcium score on 05/02/2021 was [id number] was 54th percentile for age for age and sex matched controls. -She has not had any anginal symptoms -No aspirin due to Coumadin -Continue statin therapy  6.  Aortic insufficiency -Mild by 2D echo 07/2021 -Repeat echo in 3 years  Medication Adjustments/Labs and Tests Ordered: Current medicines are reviewed at length with the patient today.  Concerns regarding medicines are outlined above.  Tests Ordered: Orders Placed This Encounter  Procedures   EKG 12-Lead   Medication Changes: No orders of the defined types were placed in this encounter.   Disposition:  Follow up in 1 year(s)  Signed, TrFransico HimMD  03/31/2022 3:15 PM    [facility]

## 2022-03-31 NOTE — Patient Instructions (Signed)
Medication Instructions:  Your physician recommends that you continue on your current medications as directed. Please refer to the Current Medication list given to you today.  *If you need a refill on your cardiac medications before your next appointment, please call your pharmacy*   Lab Work: Please complete a liver panel in our lab today before you leave.  If you have labs (blood work) drawn today and your tests are completely normal, you will receive your results only by: [place] (if you have MyChart) OR A paper copy in the mail If you have any lab test that is abnormal or we need to change your treatment, we will call you to review the results.   Testing/Procedures: None.   Follow-Up: At [facility], you and your health needs are our priority.  As part of our continuing mission to provide you with exceptional heart care, we have created designated Provider Care Teams.  These Care Teams include your primary Cardiologist (physician) and Advanced Practice Providers (APPs -  Physician Assistants and Nurse Practitioners) who all work together to provide you with the care you need, when you need it.  We recommend signing up for the patient portal called "MyChart".  Sign up information is provided on this After Visit Summary.  MyChart is used to connect with patients for Virtual Visits (Telemedicine).  Patients are able to view lab/test results, encounter notes, upcoming appointments, etc.  Non-urgent messages can be sent to your provider as well.   To learn more about what you can do with MyChart, go to [email or web address].    Your next appointment:   1 year(s)  Provider:   Fransico Him, MD     Other Instructions You have been referred to AFIB clinic. Someone will call you to set up an appointment.

## 2022-04-01 DIAGNOSIS — M25551 Pain in right hip: Secondary | ICD-10-CM | POA: Diagnosis not present

## 2022-04-01 LAB — HEPATIC FUNCTION PANEL
ALT: 7 IU/L (ref 0–32)
AST: 11 IU/L (ref 0–40)
Albumin: 4 g/dL (ref 3.8–4.8)
Alkaline Phosphatase: 67 IU/L (ref 44–121)
Bilirubin Total: <0.2 mg/dL (ref 0.0–1.2)
Bilirubin, Direct: 0.11 mg/dL (ref 0.00–0.40)
Total Protein: 6.7 g/dL (ref 6.0–8.5)

## 2022-04-04 ENCOUNTER — Telehealth: Payer: Self-pay

## 2022-04-04 NOTE — Telephone Encounter (Signed)
-----   Message from Sueanne Margarita, MD sent at 04/01/2022  9:05 AM EST ----- Please let patient know that labs were normal.  Continue current medical therapy.

## 2022-04-04 NOTE — Telephone Encounter (Signed)
Reviewed normal results with patient as well as recommendation that she continue current medication therapy per Dr. [name]. Patient verbalizes understanding and states her itching is much better than it was.

## 2022-04-11 ENCOUNTER — Other Ambulatory Visit: Payer: Self-pay | Admitting: Cardiology

## 2022-04-18 ENCOUNTER — Ambulatory Visit
Admission: RE | Admit: 2022-04-18 | Discharge: 2022-04-18 | Disposition: A | Discharge status: Home / Self Care | Payer: Medicare Other | Source: Ambulatory Visit | Attending: Nurse Practitioner | Admitting: Nurse Practitioner

## 2022-04-18 ENCOUNTER — Encounter (HOSPITAL_COMMUNITY): Payer: Self-pay | Admitting: Nurse Practitioner

## 2022-04-18 VITALS — BP 152/68 | HR 61 | Ht 64.0 in | Wt 210.0 lb

## 2022-04-18 DIAGNOSIS — Z79899 Other long term (current) drug therapy: Secondary | ICD-10-CM | POA: Insufficient documentation

## 2022-04-18 DIAGNOSIS — Z87891 Personal history of nicotine dependence: Secondary | ICD-10-CM | POA: Diagnosis not present

## 2022-04-18 DIAGNOSIS — I4819 Other persistent atrial fibrillation: Secondary | ICD-10-CM | POA: Diagnosis not present

## 2022-04-18 DIAGNOSIS — I2510 Atherosclerotic heart disease of native coronary artery without angina pectoris: Secondary | ICD-10-CM | POA: Diagnosis not present

## 2022-04-18 DIAGNOSIS — I129 Hypertensive chronic kidney disease with stage 1 through stage 4 chronic kidney disease, or unspecified chronic kidney disease: Secondary | ICD-10-CM | POA: Insufficient documentation

## 2022-04-18 DIAGNOSIS — E7800 Pure hypercholesterolemia, unspecified: Secondary | ICD-10-CM | POA: Diagnosis not present

## 2022-04-18 DIAGNOSIS — I10 Essential (primary) hypertension: Secondary | ICD-10-CM

## 2022-04-18 DIAGNOSIS — Z905 Acquired absence of kidney: Secondary | ICD-10-CM | POA: Insufficient documentation

## 2022-04-18 DIAGNOSIS — I4510 Unspecified right bundle-branch block: Secondary | ICD-10-CM | POA: Insufficient documentation

## 2022-04-18 DIAGNOSIS — Z85528 Personal history of other malignant neoplasm of kidney: Secondary | ICD-10-CM | POA: Insufficient documentation

## 2022-04-18 DIAGNOSIS — D6869 Other thrombophilia: Secondary | ICD-10-CM

## 2022-04-18 DIAGNOSIS — E1122 Type 2 diabetes mellitus with diabetic chronic kidney disease: Secondary | ICD-10-CM | POA: Insufficient documentation

## 2022-04-18 DIAGNOSIS — I480 Paroxysmal atrial fibrillation: Secondary | ICD-10-CM

## 2022-04-18 DIAGNOSIS — Z7901 Long term (current) use of anticoagulants: Secondary | ICD-10-CM | POA: Diagnosis not present

## 2022-04-18 MED ORDER — FLECAINIDE ACETATE 50 MG PO TABS: 25.0000 mg | ORAL_TABLET | Freq: Two times a day (BID) | ORAL | 1 refills | Status: DC

## 2022-04-18 NOTE — Progress Notes (Signed)
Primary Care Physician: Kelton Pillar, MD Referring Physician: Dr. Council Mechanic Runkel is a 77 y.o. female with a h/o  hx of persistent atrial fibrillation on chronic anticoagulation, chronic RBBB, DM, mild aortic insufficiency coronary artery calcifications and kidney cancer status post right nephrectomy and chemotherapy with CKD stage II with coronary artery calcium score 47 and HTN.     She had a nuclear stress test done 7/23 for flecainide showed no ischemia.  2D echo 6/23 showed normal LVF EF 60-65% with mild AR.   She is here today in the afib clinic as Dr. [name] noted on last visit some interval change of pr and qrs intervals and was questioning if pt should stay on flecainide.  EKG today shows SR with first degree AVB and RBBB.  She is compliant with her meds and is tolerating meds with no SE.  she states that her afib has been very quiet and she has not noted any afib for 10-15 years.   Today, she denies symptoms of palpitations, chest pain, shortness of breath, orthopnea, PND, lower extremity edema, dizziness, presyncope, syncope, or neurologic sequela. The patient is tolerating medications without difficulties and is otherwise without complaint today.   Past Medical History:  Diagnosis Date   Acquired solitary kidney    left side;   s/p right nephrectomy 12/ 2021   Agatston coronary artery calcium score less than 100    Coronary calcium score was 27 which is low at 54th percentile for age and sex matched controls.   Anticoagulated on Coumadin    long-term;  managed by cardiology   Aortic atherosclerosis (HCC)    Arthritis    Chronic kidney disease (CKD), stage III (moderate) (HCC)    Coronary artery calcification    Hyperlipidemia, mixed    Hypertension    Left wrist fracture    Malignant neoplasm of kidney, right ([place]) 01/2020   oncologist--- dr Alen Blew;  dx 12/ 2021  Stage IIIa,  Clear cell type;  01-29-2020  s/p robotic right radical nephrectomy (one posisitve  node);  chemo 03-31-2020 to  08-11-2020   Mild aortic insufficiency    Persistent atrial fibrillation [facility])    cardiologist--- dr t. turner;  nuclear sress test-- 08-15-2017 low risk normal perfusion, nuclear ef 61%;  event monitor 12-18-2017 in epic   RBBB (right bundle branch block)    chronic   Type 2 diabetes mellitus ([place])    Past Surgical History:  Procedure Laterality Date   COLONOSCOPY  12/29/2010   Procedure: COLONOSCOPY;  Surgeon: Owens Loffler, MD;  [facility];  Service: Endoscopy;  Laterality: N/A;   HARDWARE REMOVAL Left 05/02/2021   Procedure: left wrist removal of hardware- dorsal spanning plate;  Surgeon: [name], MD;  Location: [place];  Service: Orthopedics;  Laterality: Left;   IR IMAGING GUIDED PORT INSERTION  07/08/2020   LUMBAR LAMINECTOMY/DECOMPRESSION MICRODISCECTOMY Left 07/14/2020   Procedure: Laminectomy and Foraminotomy - Lumbar three-Lumbar four- Lumbar four-Lumbar five - left with sublaminar decompression;  Surgeon: Eustace Moore, MD;  Location: [place];  Service: Neurosurgery;  Laterality: Left;   ORIF WRIST FRACTURE Left 02/10/2021   Procedure: OPEN REDUCTION INTERNAL FIXATION (ORIF) WRIST FRACTURE;  Surgeon: [name], MD;  [facility];  Service: Orthopedics;  Laterality: Left;  with MAC   ROBOT ASSISTED LAPAROSCOPIC NEPHRECTOMY N/A 01/29/2020   Procedure: XI ROBOTIC ASSISTED LAPAROSCOPIC RADICAL NEPHRECTOMY;  Surgeon: Raynelle Bring, MD;  [facility];  Service: Urology;  Laterality:  N/A;   TUBAL LIGATION  1980    Current Outpatient Medications  Medication Sig Dispense Refill   Dulaglutide (TRULICITY) 1.5 0000000 SOPN Inject 1.5 mg into the skin every Thursday.     fenofibrate micronized (LOFIBRA) 200 MG capsule TAKE 1 CAPSULE BY MOUTH DAILY BEFORE AND BREAKFAST 120 capsule 3   flecainide (TAMBOCOR) 50 MG tablet TAKE 1 TABLET(50 MG) BY MOUTH TWICE DAILY 180 tablet 1   levothyroxine (SYNTHROID) 25 MCG tablet  TAKE 1 TABLET(25 MCG) BY MOUTH DAILY BEFORE AND BREAKFAST 90 tablet 3   lisinopril (ZESTRIL) 20 MG tablet TAKE 1 TABLET(20 MG) BY MOUTH DAILY 90 tablet 2   magnesium oxide (MAG-OX) 400 (240 Mg) MG tablet Take 400 mg by mouth daily.     metFORMIN (GLUCOPHAGE) 1000 MG tablet Take 1,000 mg by mouth daily with breakfast.  0   metoprolol succinate (TOPROL-XL) 50 MG 24 hr tablet TAKE 1 TABLET(50 MG) BY MOUTH DAILY 90 tablet 3   rosuvastatin (CRESTOR) 40 MG tablet Take 1 tablet (40 mg total) by mouth daily. 90 tablet 3   warfarin (COUMADIN) 5 MG tablet TAKE 1 TABLET BY MOUTH DAILY AS DIRECTED BY COUMADIN CLINIC 90 tablet 1   No current facility-administered medications for this encounter.    Allergies  Allergen Reactions   Iodinated Contrast Media Hives, Swelling and Rash   No Healtht[employer] Food Allergies Rash    Seafood   Penicillins Rash    Childhood reaction.   Sulfa Antibiotics Hives and Rash   Theophyllines Rash    Social History   Socioeconomic History   Marital status: Divorced    Spouse name: Not on file   Number of children: 2   Years of education: Not on file   Highest education level: Not on file  Occupational History   Occupation: ADMIN ASSISTANT  Tobacco Use   Smoking status: Former    Types: Cigarettes    Quit date: 12/28/1981    Years since quitting: 40.3   Smokeless tobacco: Never  Vaping Use   Vaping Use: Never used  Substance and Sexual Activity   Alcohol use: Yes    Comment: occas.   Drug use: No   Sexual activity: Never    Birth control/protection: Post-menopausal  Other Topics Concern   Not on file  Social History Narrative   0 caffeine drinks daily    Social Determinants of Health   Financial Resource Strain: Not on file  Food Insecurity: Not on file  Transportation Needs: Not on file  Physical Activity: Not on file  Stress: Not on file  Social Connections: Not on file  Intimate Partner Violence: Not on file    Family History  Problem  Relation Age of Onset   Cancer Mother        BRAIN/THROAT   Diabetes Father    Hypertension Father    Colonic polyp Cousin    Colon cancer Neg Hx     ROS- All systems are reviewed and negative except as per the HPI above  Physical Exam: There were no vitals filed for this visit. Wt Readings from Last 3 Encounters:  03/31/22 97.9 kg  09/16/21 100.7 kg  09/05/21 93 kg    Labs: Lab Results  Component Value Date   NA 137 02/17/2022   K 4.0 02/17/2022   CL 104 02/17/2022   CO2 27 02/17/2022   GLUCOSE 212 (H) 02/17/2022   BUN 18 02/17/2022   CREATININE 0.84 02/17/2022   CALCIUM 9.6 02/17/2022  MG 2.1 07/21/2021   Lab Results  Component Value Date   INR 2.8 03/22/2022   Lab Results  Component Value Date   CHOL 124 09/09/2021   HDL 44 09/09/2021   LDLCALC 41 09/09/2021   TRIG 195 (H) 09/09/2021     GEN- The patient is well appearing, alert and oriented x 3 today.   Head- normocephalic, atraumatic Eyes-  Sclera clear, conjunctiva pink Ears- hearing intact Oropharynx- clear Neck- supple, no JVP Lymph- no cervical lymphadenopathy Lungs- Clear to ausculation bilaterally, normal work of breathing Heart- Regular rate and rhythm, no murmurs, rubs or gallops, PMI not laterally displaced GI- soft, NT, ND, + BS Extremities- no clubbing, cyanosis, or edema MS- no significant deformity or atrophy Skin- no rash or lesion Psych- euthymic mood, full affect Neuro- strength and sensation are intact  EKG-Vent. rate 61 BPM PR interval 240 ms QRS duration 162 ms QT/QTcB 464/467 ms P-R-T axes 69 11 48 Sinus rhythm with 1st degree A-V block Right bundle branch block Abnormal ECG When compared with ECG of 07-Jul-2020 13:38, PREVIOUS ECG IS PRESENT    Assessment and Plan:  1. Afib  Has been well controlled on flecainide 50 mg bid but now with a PR int of 240 ms and RBBB with qrs of 162 ms Since it has been so quiet will  try to decrease flecainide to 25 mg bid and see  back in one week with  EKG to reassess intervals May need to see EP to be considered for an ablation if reducing flecainide  brings on afib as she does not feel she can afford Multaq.   2. CHA2DS2VASc  score of 5 Continue warfarin   See back in one week for EKG  Shantell Belongia C. Sueo Cullen, [facility] [address] [place], [place] [address] [phone]

## 2022-04-18 NOTE — Patient Instructions (Signed)
Decrease flecainide to '25mg'$  twice a day (1/2 of your '50mg'$  tablet twice a day)

## 2022-04-19 ENCOUNTER — Ambulatory Visit: Payer: Medicare Other | Admitting: *Deleted

## 2022-04-19 DIAGNOSIS — Z7189 Other specified counseling: Secondary | ICD-10-CM | POA: Diagnosis not present

## 2022-04-19 DIAGNOSIS — Z5181 Encounter for therapeutic drug level monitoring: Secondary | ICD-10-CM | POA: Diagnosis not present

## 2022-04-19 DIAGNOSIS — I4819 Other persistent atrial fibrillation: Secondary | ICD-10-CM | POA: Diagnosis not present

## 2022-04-19 DIAGNOSIS — I480 Paroxysmal atrial fibrillation: Secondary | ICD-10-CM | POA: Diagnosis not present

## 2022-04-19 LAB — POCT INR: INR: 2.2 (ref 2.0–3.0)

## 2022-04-19 NOTE — Patient Instructions (Signed)
Description   Continue taking warfarin 1/2 tablet daily except for 1 tablet on Monday.  Recheck INR in 5 weeks.  Stay consistent with greens (2-3 servings per week).  Call our office if you have any questions or if placed on any new medications [phone].

## 2022-04-20 ENCOUNTER — Inpatient Hospital Stay: Payer: Medicare Other | Attending: Oncology

## 2022-04-20 DIAGNOSIS — Z452 Encounter for adjustment and management of vascular access device: Secondary | ICD-10-CM | POA: Diagnosis not present

## 2022-04-20 DIAGNOSIS — Z85528 Personal history of other malignant neoplasm of kidney: Secondary | ICD-10-CM | POA: Diagnosis not present

## 2022-04-20 DIAGNOSIS — Z95828 Presence of other vascular implants and grafts: Secondary | ICD-10-CM

## 2022-04-20 MED ORDER — SODIUM CHLORIDE 0.9% FLUSH
10.0000 mL | Freq: Once | INTRAVENOUS | Status: AC
  Administered 2022-04-20: 10 mL via INTRAVENOUS

## 2022-04-20 MED ORDER — HEPARIN SOD (PORK) LOCK FLUSH 100 UNIT/ML IV SOLN
500.0000 [IU] | Freq: Once | INTRAVENOUS | Status: AC
  Administered 2022-04-20: 500 [IU] via INTRAVENOUS

## 2022-04-20 NOTE — Patient Instructions (Signed)

## 2022-04-25 ENCOUNTER — Ambulatory Visit (HOSPITAL_COMMUNITY)
Admission: RE | Admit: 2022-04-25 | Discharge: 2022-04-25 | Disposition: A | Discharge status: Home / Self Care | Payer: Medicare Other | Source: Ambulatory Visit | Attending: Nurse Practitioner | Admitting: Nurse Practitioner

## 2022-04-25 VITALS — HR 66

## 2022-04-25 DIAGNOSIS — I480 Paroxysmal atrial fibrillation: Secondary | ICD-10-CM | POA: Diagnosis not present

## 2022-04-25 DIAGNOSIS — D6869 Other thrombophilia: Secondary | ICD-10-CM | POA: Diagnosis not present

## 2022-04-25 DIAGNOSIS — D638 Anemia in other chronic diseases classified elsewhere: Secondary | ICD-10-CM | POA: Diagnosis not present

## 2022-04-25 DIAGNOSIS — E1165 Type 2 diabetes mellitus with hyperglycemia: Secondary | ICD-10-CM | POA: Diagnosis not present

## 2022-04-25 DIAGNOSIS — E1122 Type 2 diabetes mellitus with diabetic chronic kidney disease: Secondary | ICD-10-CM | POA: Diagnosis not present

## 2022-04-25 DIAGNOSIS — L853 Xerosis cutis: Secondary | ICD-10-CM | POA: Diagnosis not present

## 2022-04-25 DIAGNOSIS — D631 Anemia in chronic kidney disease: Secondary | ICD-10-CM | POA: Diagnosis not present

## 2022-04-25 DIAGNOSIS — L299 Pruritus, unspecified: Secondary | ICD-10-CM | POA: Diagnosis not present

## 2022-04-25 DIAGNOSIS — I4819 Other persistent atrial fibrillation: Secondary | ICD-10-CM | POA: Insufficient documentation

## 2022-04-25 DIAGNOSIS — E039 Hypothyroidism, unspecified: Secondary | ICD-10-CM | POA: Diagnosis not present

## 2022-04-25 DIAGNOSIS — N183 Chronic kidney disease, stage 3 unspecified: Secondary | ICD-10-CM | POA: Diagnosis not present

## 2022-04-25 NOTE — Progress Notes (Signed)
In for EKG  on flecainide 25 mg bid , decreased the dose for prolonged First degree AVB and RBBB. She has some interference in the baseline today but appears to be in SR with a shorter PR interval at 218 ms,  qrs interval improved at 152 ms compared to 240 and 162 ms respectively on prior EKG. F/u in one month.

## 2022-05-24 ENCOUNTER — Ambulatory Visit: Payer: Medicare Other | Attending: Cardiology

## 2022-05-24 DIAGNOSIS — I480 Paroxysmal atrial fibrillation: Secondary | ICD-10-CM | POA: Diagnosis not present

## 2022-05-24 DIAGNOSIS — Z7189 Other specified counseling: Secondary | ICD-10-CM | POA: Diagnosis not present

## 2022-05-24 DIAGNOSIS — I4819 Other persistent atrial fibrillation: Secondary | ICD-10-CM

## 2022-05-24 DIAGNOSIS — Z5181 Encounter for therapeutic drug level monitoring: Secondary | ICD-10-CM

## 2022-05-24 LAB — POCT INR: INR: 2.4 (ref 2.0–3.0)

## 2022-05-24 NOTE — Patient Instructions (Signed)
Description   Continue taking warfarin 1/2 tablet daily except for 1 tablet on Monday.  Recheck INR in 6 weeks. Stay consistent with greens (2-3 servings per week).  Call our office if you have any questions or if placed on any new medications [phone].

## 2022-05-25 ENCOUNTER — Ambulatory Visit (HOSPITAL_COMMUNITY)
Admission: RE | Admit: 2022-05-25 | Discharge: 2022-05-25 | Disposition: A | Discharge status: Home / Self Care | Payer: Medicare Other | Source: Ambulatory Visit | Attending: Internal Medicine | Admitting: Internal Medicine

## 2022-05-25 ENCOUNTER — Other Ambulatory Visit: Payer: Self-pay | Admitting: Cardiology

## 2022-05-25 VITALS — BP 148/62 | HR 61 | Ht 64.0 in | Wt 207.0 lb

## 2022-05-25 DIAGNOSIS — Z7985 Long-term (current) use of injectable non-insulin antidiabetic drugs: Secondary | ICD-10-CM | POA: Diagnosis not present

## 2022-05-25 DIAGNOSIS — E1122 Type 2 diabetes mellitus with diabetic chronic kidney disease: Secondary | ICD-10-CM | POA: Insufficient documentation

## 2022-05-25 DIAGNOSIS — Z5181 Encounter for therapeutic drug level monitoring: Secondary | ICD-10-CM

## 2022-05-25 DIAGNOSIS — Z7984 Long term (current) use of oral hypoglycemic drugs: Secondary | ICD-10-CM | POA: Diagnosis not present

## 2022-05-25 DIAGNOSIS — D6869 Other thrombophilia: Secondary | ICD-10-CM | POA: Diagnosis not present

## 2022-05-25 DIAGNOSIS — Z9221 Personal history of antineoplastic chemotherapy: Secondary | ICD-10-CM | POA: Diagnosis not present

## 2022-05-25 DIAGNOSIS — Z79899 Other long term (current) drug therapy: Secondary | ICD-10-CM | POA: Diagnosis not present

## 2022-05-25 DIAGNOSIS — Z87891 Personal history of nicotine dependence: Secondary | ICD-10-CM | POA: Diagnosis not present

## 2022-05-25 DIAGNOSIS — Z8249 Family history of ischemic heart disease and other diseases of the circulatory system: Secondary | ICD-10-CM | POA: Diagnosis not present

## 2022-05-25 DIAGNOSIS — Z833 Family history of diabetes mellitus: Secondary | ICD-10-CM | POA: Insufficient documentation

## 2022-05-25 DIAGNOSIS — I4819 Other persistent atrial fibrillation: Secondary | ICD-10-CM | POA: Diagnosis not present

## 2022-05-25 DIAGNOSIS — I2510 Atherosclerotic heart disease of native coronary artery without angina pectoris: Secondary | ICD-10-CM | POA: Diagnosis not present

## 2022-05-25 DIAGNOSIS — N182 Chronic kidney disease, stage 2 (mild): Secondary | ICD-10-CM | POA: Diagnosis not present

## 2022-05-25 DIAGNOSIS — I4510 Unspecified right bundle-branch block: Secondary | ICD-10-CM | POA: Insufficient documentation

## 2022-05-25 DIAGNOSIS — Z7901 Long term (current) use of anticoagulants: Secondary | ICD-10-CM | POA: Diagnosis not present

## 2022-05-25 DIAGNOSIS — Z85528 Personal history of other malignant neoplasm of kidney: Secondary | ICD-10-CM | POA: Insufficient documentation

## 2022-05-25 NOTE — Progress Notes (Signed)
Primary Care Physician: Ollen Bowl, MD Referring Physician: Dr. Jeneen Rinks is a 77 y.o. female with a h/o  hx of persistent atrial fibrillation on chronic anticoagulation, chronic RBBB, DM, mild aortic insufficiency coronary artery calcifications and kidney cancer status post right nephrectomy and chemotherapy with CKD stage II with coronary artery calcium score 47 and HTN.     She had a nuclear stress test done 7/23 for flecainide showed no ischemia.  2D echo 6/23 showed normal LVF EF 60-65% with mild AR.   She is here today in the afib clinic as Dr. Mayford Knife noted on last visit some interval change of pr and qrs intervals and was questioning if pt should stay on flecainide.  EKG today shows SR with first degree AVB and RBBB.  She is compliant with her meds and is tolerating meds with no SE. She states that her afib has been very quiet and she has not noted any afib for 10-15 years.   On follow up today, she is in SR currently. She was seen on 3/5 and flecainide was reduced to 25 mg BID due to prolonged 1st degree AV block and RBBB. Repeat ECG on 3/12 was stable. She has not had any episodes of Afib since last office visit or overall in a while. She is compliant with flecainide, Toprol, and coumadin. No bleeding concerns.  Today, she denies symptoms of palpitations, chest pain, shortness of breath, orthopnea, PND, lower extremity edema, dizziness, presyncope, syncope, or neurologic sequela. The patient is tolerating medications without difficulties and is otherwise without complaint today.   Past Medical History:  Diagnosis Date   Acquired solitary kidney    left side;   s/p right nephrectomy 12/ 2021   Agatston coronary artery calcium score less than 100    Coronary calcium score was 27 which is low at 54th percentile for age and sex matched controls.   Anticoagulated on Coumadin    long-term;  managed by cardiology   Aortic atherosclerosis (HCC)    Arthritis     Chronic kidney disease (CKD), stage III (moderate) (HCC)    Coronary artery calcification    Hyperlipidemia, mixed    Hypertension    Left wrist fracture    Malignant neoplasm of kidney, right ([facility]) 01/2020   oncologist--- dr Clelia Croft;  dx 12/ 2021  Stage IIIa,  Clear cell type;  01-29-2020  s/p robotic right radical nephrectomy (one posisitve node);  chemo 03-31-2020 to  08-11-2020   Mild aortic insufficiency    Persistent atrial fibrillation [facility])    cardiologist--- dr t. turner;  nuclear sress test-- 08-15-2017 low risk normal perfusion, nuclear ef 61%;  event monitor 12-18-2017 in epic   RBBB (right bundle branch block)    chronic   Type 2 diabetes mellitus (HCC)    Past Surgical History:  Procedure Laterality Date   COLONOSCOPY  12/29/2010   Procedure: COLONOSCOPY;  Surgeon: Rob Bunting, MD;  [facility];  Service: Endoscopy;  Laterality: N/A;   HARDWARE REMOVAL Left 05/02/2021   Procedure: left wrist removal of hardware- dorsal spanning plate;  Surgeon: Gomez Cleverly, MD;  Location: [facility] [place];  Service: Orthopedics;  Laterality: Left;   IR IMAGING GUIDED PORT INSERTION  07/08/2020   LUMBAR LAMINECTOMY/DECOMPRESSION MICRODISCECTOMY Left 07/14/2020   Procedure: Laminectomy and Foraminotomy - Lumbar three-Lumbar four- Lumbar four-Lumbar five - left with sublaminar decompression;  Surgeon: Tia Alert, MD;  Location: [facility];  Service: Neurosurgery;  Laterality: Left;  ORIF WRIST FRACTURE Left 02/10/2021   Procedure: OPEN REDUCTION INTERNAL FIXATION (ORIF) WRIST FRACTURE;  Surgeon: Gomez Cleverly, MD;  [facility];  Service: Orthopedics;  Laterality: Left;  with MAC   ROBOT ASSISTED LAPAROSCOPIC NEPHRECTOMY N/A 01/29/2020   Procedure: XI ROBOTIC ASSISTED LAPAROSCOPIC RADICAL NEPHRECTOMY;  Surgeon: Heloise Purpura, MD;  [facility];  Service: Urology;  Laterality: N/A;   TUBAL LIGATION  1980    Current Outpatient Medications  Medication Sig Dispense  Refill   diphenhydrAMINE (BENADRYL) 25 mg capsule Take 25 mg by mouth as needed.     fenofibrate micronized (LOFIBRA) 200 MG capsule TAKE 1 CAPSULE BY MOUTH DAILY BEFORE AND BREAKFAST 120 capsule 3   flecainide (TAMBOCOR) 50 MG tablet Take 0.5 tablets (25 mg total) by mouth 2 (two) times daily. 180 tablet 1   levothyroxine (SYNTHROID) 50 MCG tablet Take 50 mcg by mouth every morning.     lisinopril (ZESTRIL) 20 MG tablet TAKE 1 TABLET(20 MG) BY MOUTH DAILY 90 tablet 2   magnesium oxide (MAG-OX) 400 (240 Mg) MG tablet Take 400 mg by mouth daily.     metFORMIN (GLUCOPHAGE) 1000 MG tablet Take 1,000 mg by mouth daily with breakfast.  0   metoprolol succinate (TOPROL-XL) 50 MG 24 hr tablet TAKE 1 TABLET(50 MG) BY MOUTH DAILY 90 tablet 3   OZEMPIC, 0.25 OR 0.5 MG/DOSE, 2 MG/3ML SOPN Inject into the skin once a week.     warfarin (COUMADIN) 5 MG tablet TAKE 1 TABLET BY MOUTH DAILY AS DIRECTED BY COUMADIN CLINIC 90 tablet 1   rosuvastatin (CRESTOR) 40 MG tablet Take 1 tablet (40 mg total) by mouth daily. (Patient not taking: Reported on 05/25/2022) 90 tablet 3   No current facility-administered medications for this encounter.    Allergies  Allergen Reactions   Iodinated Contrast Media Hives, Swelling and Rash   Fish Allergy Other (See Comments)   Iodine Other (See Comments)   Pembrolizumab Other (See Comments)    Severe gum pain, caused thyroid issues   Shellfish Allergy Other (See Comments)   No Healt[employer] Food Allergies Rash    Seafood   Penicillins Rash    Childhood reaction.   Sulfa Antibiotics Hives and Rash   Theophyllines Rash    Social History   Socioeconomic History   Marital status: Divorced    Spouse name: Not on file   Number of children: 2   Years of education: Not on file   Highest education level: Not on file  Occupational History   Occupation: ADMIN ASSISTANT  Tobacco Use   Smoking status: Former    Types: Cigarettes    Quit date: 12/28/1981    Years since  quitting: 40.4   Smokeless tobacco: Never  Vaping Use   Vaping Use: Never used  Substance and Sexual Activity   Alcohol use: Yes    Comment: occas.   Drug use: No   Sexual activity: Never    Birth control/protection: Post-menopausal  Other Topics Concern   Not on file  Social History Narrative   0 caffeine drinks daily    Social Determinants of Health   Financial Resource Strain: Not on file  Food Insecurity: Not on file  Transportation Needs: Not on file  Physical Activity: Not on file  Stress: Not on file  Social Connections: Not on file  Intimate Partner Violence: Not on file    Family History  Problem Relation Age of Onset   Cancer Mother  BRAIN/THROAT   Diabetes Father    Hypertension Father    Colonic polyp Cousin    Colon cancer Neg Hx     ROS- All systems are reviewed and negative except as per the HPI above  Physical Exam: Vitals:   05/25/22 1441  BP: (!) 148/62  Pulse: 61  Weight: 93.9 kg  Height: 5\' 4"  (1.626 m)   Wt Readings from Last 3 Encounters:  05/25/22 93.9 kg  04/18/22 95.3 kg  03/31/22 97.9 kg    Labs: Lab Results  Component Value Date   NA 137 02/17/2022   K 4.0 02/17/2022   CL 104 02/17/2022   CO2 27 02/17/2022   GLUCOSE 212 (H) 02/17/2022   BUN 18 02/17/2022   CREATININE 0.84 02/17/2022   CALCIUM 9.6 02/17/2022   MG 2.1 07/21/2021   Lab Results  Component Value Date   INR 2.4 05/24/2022   Lab Results  Component Value Date   CHOL 124 09/09/2021   HDL 44 09/09/2021   LDLCALC 41 09/09/2021   TRIG 195 (H) 09/09/2021   GEN- The patient is well appearing, alert and oriented x 3 today.   Head- normocephalic, atraumatic Eyes-  Sclera clear, conjunctiva pink Ears- hearing intact Oropharynx- clear Neck- supple, no JVP Lymph- no cervical lymphadenopathy Lungs- Clear to ausculation bilaterally, normal work of breathing Heart- Regular rate and rhythm, no murmurs, rubs or gallops, PMI not laterally displaced GI- soft,  NT, ND, + BS Extremities- no clubbing, cyanosis, or edema MS- no significant deformity or atrophy Skin- no rash or lesion Psych- euthymic mood, full affect Neuro- strength and sensation are intact  EKG-Vent. rate 61 BPM PR interval 200 ms QRS duration 162 ms QT/QTcB 450/453 ms  Sinus rhythm Right bundle branch block Abnormal ECG   Assessment and Plan:  1. Afib   She is in SR today.   Flecainide decreased to 25 mg BID on 04/18/22. Repeat ECG 3/12 showed SR with PR 218 ms and QRS 152 ms.   PR and QRS today are stable. Continue flecainide 25 mg BID.   In the future, may need to see EP to be considered for an ablation if reducing flecainide  brings on afib as she does not feel she can afford Multaq.   2. CHA2DS2VASc  score of 5 Continue warfarin.   F/u 6 months Afib clinic.   Lake BellsJoseph Kaydince Towles, PA-C [facility] [address] [place], [place] [id number] [phone]

## 2022-05-30 ENCOUNTER — Ambulatory Visit: Payer: Medicare Other | Admitting: Allergy

## 2022-05-30 ENCOUNTER — Encounter: Payer: Self-pay | Admitting: Allergy

## 2022-05-30 ENCOUNTER — Encounter: Payer: Self-pay | Admitting: Oncology

## 2022-05-30 ENCOUNTER — Other Ambulatory Visit: Payer: Self-pay

## 2022-05-30 VITALS — BP 134/62 | HR 65 | Temp 97.9°F | Resp 16 | Ht 63.0 in | Wt 204.5 lb

## 2022-05-30 DIAGNOSIS — L853 Xerosis cutis: Secondary | ICD-10-CM | POA: Diagnosis not present

## 2022-05-30 DIAGNOSIS — T781XXD Other adverse food reactions, not elsewhere classified, subsequent encounter: Secondary | ICD-10-CM

## 2022-05-30 DIAGNOSIS — L299 Pruritus, unspecified: Secondary | ICD-10-CM | POA: Diagnosis not present

## 2022-05-30 MED ORDER — EPICERAM EX EMUL: CUTANEOUS | 3 refills | Status: DC

## 2022-05-30 NOTE — Patient Instructions (Addendum)
Itching The number one cause of itching in your age group is dry skin. I don't think this is related to allergies. Make sure you get your thyroid checked - that can sometimes cause skin issues. Drink water to hydrate.   See below for proper skincare. Moisturize everyday. Sending in a prescription lotion called epicerum - use twice a day as a moisturizer. Samples given.  If this works well for you, then have [facility] ship the medication to your home - prescription already sent in.  If not covered then use over the counter moisturizers as below.  Start zyrtec (cetirizine)  once a day 1 hour before bedtime.  If symptoms are not controlled or causes drowsiness let us know. Avoid the following potential triggers: alcohol, tight clothing, NSAIDs, hot showers and getting overheated. Get bloodwork:  We are ordering labs, so please allow 1-2 weeks for the results to come back. With the newly implemented Cures Act, the labs might be visible to you at the same time that they become visible to me. However, I will not address the results until all of the results are back, so please be patient.   Food Continue to avoid seafood as before. For mild symptoms you can take over the counter antihistamines such as Benadryl and monitor symptoms closely. If symptoms worsen or if you have severe symptoms including breathing issues, throat closure, significant swelling, whole body hives, severe diarrhea and vomiting, lightheadedness then seek immediate medical care.  Follow up in 2 months or sooner if needed.  Skin care recommendations  Bath time: Always use lukewarm water. AVOID very hot or cold water. Keep bathing time to 5-10 minutes. Do NOT use bubble bath. Use a mild soap and use just enough to wash the dirty areas. Do NOT scrub skin vigorously.  After bathing, pat dry your skin with a towel. Do NOT rub or scrub the skin.  Moisturizers and prescriptions:  ALWAYS apply moisturizers immediately  after bathing (within 3 minutes). This helps to lock-in moisture. Use the moisturizer several times a day over the whole body. Good summer moisturizers include: Aveeno, CeraVe, Cetaphil. Good winter moisturizers include: Aquaphor, Vaseline, Cerave, Cetaphil, Eucerin, Vanicream. When using moisturizers along with medications, the moisturizer should be applied about one hour after applying the medication to prevent diluting effect of the medication or moisturize around where you applied the medications. When not using medications, the moisturizer can be continued twice daily as maintenance.  Laundry and clothing: Avoid laundry products with added color or perfumes. Use unscented hypo-allergenic laundry products such as Tide free, Cheer free & gentle, and All free and clear.  If the skin still seems dry or sensitive, you can try double-rinsing the clothes. Avoid tight or scratchy clothing such as wool. Do not use fabric softeners or dyer sheets.

## 2022-05-30 NOTE — Assessment & Plan Note (Signed)
Avoiding seafood due to rash in the past. Continue to avoid. For mild symptoms you can take over the counter antihistamines such as Benadryl and monitor symptoms closely. If symptoms worsen or if you have severe symptoms including breathing issues, throat closure, significant swelling, whole body hives, severe diarrhea and vomiting, lightheadedness then seek immediate medical care.

## 2022-05-30 NOTE — Assessment & Plan Note (Addendum)
Pruritus x 3 months with no triggers. Tried benadryl with minimal benefit. Does not moisturize daily. 2024 bloodwork showed anemia (on iron pills), elevated glucose on CMP, elevated TSH. Medical history significant for s/p right nephrectomy due to renal cell carcinoma, afib on coumadin. Concerned if allergic to something. Discussed with patient that the main cause of pruritus in her age group is usually xerosis which she has. Unlikely that her pruritus is related to any food or environmental allergies due to lack of other symptoms.  Unable to skin test today due to recent antihistamine intake and not necessary. Concerning that her TSH is elevated - apparently her synthroid was recently adjusted.  See below for proper skincare. Moisturize everyday and hydrate.  Sending in a prescription lotion called epicerum - use twice a day as a moisturizer. Samples given.  If this works well for you, then have [facility] ship the medication to your home - prescription already sent in.  If not covered then use over the counter moisturizers as below. Start zyrtec (cetirizine)  once a day 1 hour before bedtime to help with itching.  If symptoms are not controlled or causes drowsiness let us know. Avoid the following potential triggers: alcohol, tight clothing, NSAIDs, hot showers and getting overheated. Get bloodwork to rule out other etiologies. Did not order CBC diff, CMP and TSH as apparently they usually monitor this every few months.

## 2022-05-30 NOTE — Progress Notes (Signed)
New Patient Note  RE: Latasha Hodges MRN: [id number] DOB: 1945/02/21 Date of Office Visit: 05/30/2022  Consult requested by: Ollen Bowl, MD Primary care provider: Ollen Bowl, MD  Chief Complaint: Pruritus (Itchy for about 2/3 months ago. Thought a medication was causing it and she stopped it but is still itching )  History of Present Illness: I had the pleasure of seeing Latasha Hodges for initial evaluation at the [facility] on 05/30/2022. She is a 77 y.o. female, who is referred here by Ollen Bowl, MD for the evaluation of pruritus.  Itching started about 2-3 months ago. Mainly occurs on her torso and head. Describes them as itching only with no rash. Associated symptoms include: none.  Frequency of episodes: daily throughout the day. Suspected triggers are unknown.  She was started on a new cholesterol medication around this time called rosuvastatin. She stopped the medication but still itching.   Denies any fevers, chills, foods, personal care products or recent infections. She has tried the following therapies: benadryl with minimal benefit.  Patient did not pick up hydroxyzine due to concerns as she has afib.  Systemic steroids: no. Currently only taking benadryl prn.  Previous work up includes: 2024 bloodwork - cbc diff (anemia on iron pills), cmp (elevated glucose), TSH (elevated). Scheduled to get bloodwork drawn next month.  Previous history of rash/hives/itching: denies.  Patient diagnoses with kidney cancer in 2021 and had right nephrectomy. Following with heme/onc and currently on remission. Still has port in.   Assessment and Plan: Deniz is a 77 y.o. female with: Pruritus Pruritus x 3 months with no triggers. Tried benadryl with minimal benefit. Does not moisturize daily. 2024 bloodwork showed anemia (on iron pills), elevated glucose on CMP, elevated TSH. Medical history significant for s/p right nephrectomy due to renal  cell carcinoma, afib on coumadin. Concerned if allergic to something. Discussed with patient that the main cause of pruritus in her age group is usually xerosis which she has. Unlikely that her pruritus is related to any food or environmental allergies due to lack of other symptoms.  Unable to skin test today due to recent antihistamine intake and not necessary. Concerning that her TSH is elevated - apparently her synthroid was recently adjusted.  See below for proper skincare. Moisturize everyday and hydrate.  Sending in a prescription lotion called epicerum - use twice a day as a moisturizer. Samples given.  If this works well for you, then have [facility] ship the medication to your home - prescription already sent in.  If not covered then use over the counter moisturizers as below. Start zyrtec (cetirizine) 10mg  once a day 1 hour before bedtime to help with itching.  If symptoms are not controlled or causes drowsiness let us know. Avoid the following potential triggers: alcohol, tight clothing, NSAIDs, hot showers and getting overheated. Get bloodwork to rule out other etiologies. Did not order CBC diff, CMP and TSH as apparently they usually monitor this every few months.   Other adverse food reactions, not elsewhere classified, subsequent encounter Avoiding seafood due to rash in the past. Continue to avoid. For mild symptoms you can take over the counter antihistamines such as Benadryl and monitor symptoms closely. If symptoms worsen or if you have severe symptoms including breathing issues, throat closure, significant swelling, whole body hives, severe diarrhea and vomiting, lightheadedness then seek immediate medical care.  Return in about 2 months (around 07/30/2022).  Meds ordered this encounter  Medications  Dermatological Products, Misc. [facility]) lotion    Sig: Apply twice a day to the body as a moisturizer    Dispense:  225 g    Refill:  3    [id number]   Lab Orders          ANA w/Reflex         Alpha-Gal Panel         C-reactive protein         Tryptase         Sedimentation rate      Other allergy screening: Asthma: no No inhaler use for 3-4 years. Sometimes URI causes bronchitis.  Rhino conjunctivitis: no Food allergy: yes Fish caused rash in the past. Medication allergy: yes Hymenoptera allergy: no Urticaria: no Eczema:no History of recurrent infections suggestive of immunodeficency: no  Diagnostics: Skin Testing: Deferred due to recent antihistamines use.   Past Medical History: Patient Active Problem List   Diagnosis Date Noted   Pruritus 05/30/2022   Other adverse food reactions, not elsewhere classified, subsequent encounter 05/30/2022   Hypercoagulable state due to persistent atrial fibrillation 05/25/2022   Encounter for monitoring flecainide therapy 05/25/2022   Agatston coronary artery calcium score less than 100 04/29/2021   Port-A-Cath in place 03/02/2021   Aortic atherosclerosis 02/01/2021   S/P lumbar laminectomy 07/14/2020   Essential (primary) hypertension 04/20/2020   Neoplasm of right kidney 01/29/2020   Renal mass 12/23/2019   RBBB (right bundle branch block) 11/05/2017   Chronic pain of right knee 10/16/2017   Hyperlipidemia 08/09/2017   Encounter for therapeutic drug monitoring 11/20/2014   Persistent atrial fibrillation    Acquired spondylolisthesis 01/17/2013   Diabetes mellitus 10/04/2010   Body mass index (BMI) 35.0-35.9, adult 10/04/2010   Past Medical History:  Diagnosis Date   Acquired solitary kidney    left side;   s/p right nephrectomy 12/ 2021   Agatston coronary artery calcium score less than 100    Coronary calcium score was 27 which is low at [facility] for age and sex matched controls.   Anticoagulated on Coumadin    long-term;  managed by cardiology   Aortic atherosclerosis    Arthritis    Chronic kidney disease (CKD), stage III (moderate)    Coronary artery calcification     Hyperlipidemia, mixed    Hypertension    Left wrist fracture    Malignant neoplasm of kidney, right 01/2020   oncologist--- dr Clelia Croft;  dx 12/ 2021  Stage IIIa,  Clear cell type;  01-29-2020  s/p robotic right radical nephrectomy (one posisitve node);  chemo 03-31-2020 to  08-11-2020   Mild aortic insufficiency    Persistent atrial fibrillation    cardiologist--- dr t. turner;  nuclear sress test-- 08-15-2017 low risk normal perfusion, nuclear ef 61%;  event monitor 12-18-2017 in epic   RBBB (right bundle branch block)    chronic   Type 2 diabetes mellitus    Past Surgical History: Past Surgical History:  Procedure Laterality Date   COLONOSCOPY  12/29/2010   Procedure: COLONOSCOPY;  Surgeon: Rob Bunting, MD;  Location: [facility];  Service: Endoscopy;  Laterality: N/A;   HARDWARE REMOVAL Left 05/02/2021   Procedure: left wrist removal of hardware- dorsal spanning plate;  Surgeon: Gomez Cleverly, MD;  Location: [facility];  Service: Orthopedics;  Laterality: Left;   IR IMAGING GUIDED PORT INSERTION  07/08/2020   LUMBAR LAMINECTOMY/DECOMPRESSION MICRODISCECTOMY Left 07/14/2020   Procedure: Laminectomy and Foraminotomy - Lumbar three-Lumbar four- Lumbar four-Lumbar five -  left with sublaminar decompression;  Surgeon: Tia Alert, MD;  Location: [facility] OR;  Service: Neurosurgery;  Laterality: Left;   ORIF WRIST FRACTURE Left 02/10/2021   Procedure: OPEN REDUCTION INTERNAL FIXATION (ORIF) WRIST FRACTURE;  Surgeon: Gomez Cleverly, MD;  Location: [facility];  Service: Orthopedics;  Laterality: Left;  with MAC   ROBOT ASSISTED LAPAROSCOPIC NEPHRECTOMY N/A 01/29/2020   Procedure: XI ROBOTIC ASSISTED LAPAROSCOPIC RADICAL NEPHRECTOMY;  Surgeon: Heloise Purpura, MD;  Location: [facility];  Service: Urology;  Laterality: N/A;   TUBAL LIGATION  1980   Medication List:  Current Outpatient Medications  Medication Sig Dispense Refill   Dermatological Products, Misc. [facility]) lotion Apply twice a  day to the body as a moisturizer 225 g 3   diphenhydrAMINE (BENADRYL) 25 mg capsule Take 25 mg by mouth as needed.     fenofibrate micronized (LOFIBRA) 200 MG capsule TAKE 1 CAPSULE BY MOUTH DAILY BEFORE AND BREAKFAST 120 capsule 3   flecainide (TAMBOCOR) 50 MG tablet Take 0.5 tablets (25 mg total) by mouth 2 (two) times daily. 180 tablet 1   levothyroxine (SYNTHROID) 50 MCG tablet Take 50 mcg by mouth every morning.     lisinopril (ZESTRIL) 20 MG tablet TAKE 1 TABLET(20 MG) BY MOUTH DAILY 90 tablet 2   magnesium oxide (MAG-OX) 400 (240 Mg) MG tablet Take 400 mg by mouth daily.     metFORMIN (GLUCOPHAGE) 1000 MG tablet Take 1,000 mg by mouth daily with breakfast.  0   metoprolol succinate (TOPROL-XL) 50 MG 24 hr tablet TAKE 1 TABLET(50 MG) BY MOUTH DAILY 90 tablet 3   warfarin (COUMADIN) 5 MG tablet TAKE 1 TABLET BY MOUTH DAILY AS DIRECTED BY COUMADIN CLINIC 90 tablet 1   hydrOXYzine (ATARAX) 25 MG tablet Take 25 mg by mouth daily as needed. (Patient not taking: Reported on 05/30/2022)     OZEMPIC, 0.25 OR 0.5 MG/DOSE, 2 MG/3ML SOPN Inject into the skin once a week. (Patient not taking: Reported on 05/30/2022)     rosuvastatin (CRESTOR) 40 MG tablet Take 1 tablet (40 mg total) by mouth daily. (Patient not taking: Reported on 05/25/2022) 90 tablet 3   No current facility-administered medications for this visit.   Allergies: Allergies  Allergen Reactions   Iodinated Contrast Media Hives, Swelling and Rash   Fish Allergy Other (See Comments)   Iodine Other (See Comments)   Pembrolizumab Other (See Comments)    Severe gum pain, caused thyroid issues   Shellfish Allergy Other (See Comments)   No [facility] Food Allergies Rash    Seafood   Penicillins Rash    Childhood reaction.   Sulfa Antibiotics Hives and Rash   Theophyllines Rash   Social History: Social History   Socioeconomic History   Marital status: Divorced    Spouse name: Not on file   Number of children: 2   Years of  education: Not on file   Highest education level: Not on file  Occupational History   Occupation: ADMIN ASSISTANT  Tobacco Use   Smoking status: Former    Types: Cigarettes    Quit date: 12/28/1981    Years since quitting: 40.4   Smokeless tobacco: Never  Vaping Use   Vaping Use: Never used  Substance and Sexual Activity   Alcohol use: Yes    Comment: occas.   Drug use: No   Sexual activity: Never    Birth control/protection: Post-menopausal  Other Topics Concern   Not on file  Social History Narrative   0 caffeine  drinks daily    Social Determinants of Health   Financial Resource Strain: Not on file  Food Insecurity: Not on file  Transportation Needs: Not on file  Physical Activity: Not on file  Stress: Not on file  Social Connections: Not on file   Lives in a townhome. Smoking: denies Occupation: Production designer, theatre/television/film HistorySurveyor, minerals in the house: no Engineer, civil (consulting) in the family room: no Carpet in the bedroom: yes Heating: gas Cooling: central Pet: yes 1 cat x 8 yrs  Family History: Family History  Problem Relation Age of Onset   Cancer Mother        BRAIN/THROAT   Diabetes Father    Hypertension Father    Colonic polyp Cousin    Colon cancer Neg Hx    Allergic rhinitis Neg Hx    Angioedema Neg Hx    Asthma Neg Hx    Eczema Neg Hx    Urticaria Neg Hx    Review of Systems  Constitutional:  Negative for appetite change, chills, fever and unexpected weight change.  HENT:  Negative for congestion and rhinorrhea.   Eyes:  Negative for itching.  Respiratory:  Negative for cough, chest tightness, shortness of breath and wheezing.   Cardiovascular:  Negative for chest pain.  Gastrointestinal:  Negative for abdominal pain.  Genitourinary:  Negative for difficulty urinating.  Skin:  Negative for rash.       pruritus  Neurological:  Negative for headaches.    Objective: BP 134/62   Pulse 65   Temp 97.9 F (36.6 C)   Resp 16   Ht   (1.6 m)   Wt 204 lb 8 oz (92.8 kg)   SpO2 96%   BMI 36.23 kg/m  Body mass index is 36.23 kg/m. Physical Exam Vitals and nursing note reviewed.  Constitutional:      Appearance: Normal appearance. She is well-developed.  HENT:     Head: Normocephalic and atraumatic.     Right Ear: Tympanic membrane and external ear normal.     Left Ear: Tympanic membrane and external ear normal.     Nose: Nose normal.     Mouth/Throat:     Mouth: Mucous membranes are moist.     Pharynx: Oropharynx is clear.  Eyes:     Conjunctiva/sclera: Conjunctivae normal.  Cardiovascular:     Rate and Rhythm: Normal rate and regular rhythm.     Heart sounds: Normal heart sounds. No murmur heard.    No friction rub. No gallop.  Pulmonary:     Effort: Pulmonary effort is normal.     Breath sounds: Normal breath sounds. No wheezing, rhonchi or rales.  Musculoskeletal:     Cervical back: Neck supple.  Skin:    General: Skin is warm and dry.     Findings: No rash.     Comments: Dry, frail skin  Neurological:     Mental Status: She is alert and oriented to person, place, and time.  Psychiatric:        Behavior: Behavior normal.    The plan was reviewed with the patient/family, and all questions/concerned were addressed.  It was my pleasure to see Devan today and participate in her care. Please feel free to contact me with any questions or concerns.  Sincerely,  Wyline Mood, DO Allergy & Immunology  [facility] office: [phone] [facility] office: [phone]

## 2022-06-07 DIAGNOSIS — Z1231 Encounter for screening mammogram for malignant neoplasm of breast: Secondary | ICD-10-CM | POA: Diagnosis not present

## 2022-06-07 DIAGNOSIS — R92323 Mammographic fibroglandular density, bilateral breasts: Secondary | ICD-10-CM | POA: Diagnosis not present

## 2022-06-15 ENCOUNTER — Ambulatory Visit: Payer: Self-pay | Admitting: Allergy

## 2022-06-19 ENCOUNTER — Other Ambulatory Visit: Payer: Self-pay

## 2022-06-20 ENCOUNTER — Other Ambulatory Visit: Payer: Self-pay | Admitting: *Deleted

## 2022-06-20 ENCOUNTER — Inpatient Hospital Stay: Payer: Medicare Other

## 2022-06-20 ENCOUNTER — Inpatient Hospital Stay: Payer: Medicare Other | Attending: Oncology

## 2022-06-20 VITALS — BP 143/53 | HR 59 | Temp 97.7°F

## 2022-06-20 DIAGNOSIS — Z85528 Personal history of other malignant neoplasm of kidney: Secondary | ICD-10-CM | POA: Diagnosis not present

## 2022-06-20 DIAGNOSIS — Z452 Encounter for adjustment and management of vascular access device: Secondary | ICD-10-CM | POA: Insufficient documentation

## 2022-06-20 DIAGNOSIS — E039 Hypothyroidism, unspecified: Secondary | ICD-10-CM | POA: Diagnosis not present

## 2022-06-20 DIAGNOSIS — Z79899 Other long term (current) drug therapy: Secondary | ICD-10-CM | POA: Insufficient documentation

## 2022-06-20 DIAGNOSIS — D49511 Neoplasm of unspecified behavior of right kidney: Secondary | ICD-10-CM

## 2022-06-20 DIAGNOSIS — Z95828 Presence of other vascular implants and grafts: Secondary | ICD-10-CM

## 2022-06-20 LAB — CBC WITH DIFFERENTIAL (CANCER CENTER ONLY)
Abs Immature Granulocytes: 0.02 10*3/uL (ref 0.00–0.07)
Basophils Absolute: 0.1 10*3/uL (ref 0.0–0.1)
Basophils Relative: 1 %
Eosinophils Absolute: 0.3 10*3/uL (ref 0.0–0.5)
Eosinophils Relative: 5 %
HCT: 31.9 % — ABNORMAL LOW (ref 36.0–46.0)
Hemoglobin: 10.5 g/dL — ABNORMAL LOW (ref 12.0–15.0)
Immature Granulocytes: 0 %
Lymphocytes Relative: 26 %
Lymphs Abs: 1.6 10*3/uL (ref 0.7–4.0)
MCH: 27.5 pg (ref 26.0–34.0)
MCHC: 32.9 g/dL (ref 30.0–36.0)
MCV: 83.5 fL (ref 80.0–100.0)
Monocytes Absolute: 0.6 10*3/uL (ref 0.1–1.0)
Monocytes Relative: 10 %
Neutro Abs: 3.6 10*3/uL (ref 1.7–7.7)
Neutrophils Relative %: 58 %
Platelet Count: 278 10*3/uL (ref 150–400)
RBC: 3.82 MIL/uL — ABNORMAL LOW (ref 3.87–5.11)
RDW: 14.9 % (ref 11.5–15.5)
WBC Count: 6.2 10*3/uL (ref 4.0–10.5)
nRBC: 0 % (ref 0.0–0.2)

## 2022-06-20 LAB — CMP (CANCER CENTER ONLY)
ALT: 8 U/L (ref 0–44)
AST: 11 U/L — ABNORMAL LOW (ref 15–41)
Albumin: 3.9 g/dL (ref 3.5–5.0)
Alkaline Phosphatase: 47 U/L (ref 38–126)
Anion gap: 9 (ref 5–15)
BUN: 34 mg/dL — ABNORMAL HIGH (ref 8–23)
CO2: 23 mmol/L (ref 22–32)
Calcium: 9.5 mg/dL (ref 8.9–10.3)
Chloride: 104 mmol/L (ref 98–111)
Creatinine: 0.94 mg/dL (ref 0.44–1.00)
GFR, Estimated: >60 mL/min (ref >60)
Glucose, Bld: 189 mg/dL — ABNORMAL HIGH (ref 70–99)
Potassium: 4.1 mmol/L (ref 3.5–5.1)
Sodium: 136 mmol/L (ref 135–145)
Total Bilirubin: 0.3 mg/dL (ref 0.3–1.2)
Total Protein: 6.9 g/dL (ref 6.5–8.1)

## 2022-06-20 LAB — TSH: TSH: 6.494 u[IU]/mL — ABNORMAL HIGH (ref 0.350–4.500)

## 2022-06-20 MED ORDER — SODIUM CHLORIDE 0.9% FLUSH
10.0000 mL | Freq: Once | INTRAVENOUS | Status: AC
  Administered 2022-06-20: 10 mL via INTRAVENOUS

## 2022-06-20 MED ORDER — HEPARIN SOD (PORK) LOCK FLUSH 100 UNIT/ML IV SOLN
500.0000 [IU] | Freq: Once | INTRAVENOUS | Status: AC
  Administered 2022-06-20: 500 [IU] via INTRAVENOUS

## 2022-06-21 ENCOUNTER — Other Ambulatory Visit: Payer: Self-pay

## 2022-06-21 ENCOUNTER — Other Ambulatory Visit: Payer: Self-pay | Admitting: *Deleted

## 2022-06-21 DIAGNOSIS — I10 Essential (primary) hypertension: Secondary | ICD-10-CM

## 2022-06-21 DIAGNOSIS — E7800 Pure hypercholesterolemia, unspecified: Secondary | ICD-10-CM

## 2022-06-21 DIAGNOSIS — D49511 Neoplasm of unspecified behavior of right kidney: Secondary | ICD-10-CM

## 2022-06-21 DIAGNOSIS — I4510 Unspecified right bundle-branch block: Secondary | ICD-10-CM

## 2022-06-21 DIAGNOSIS — I480 Paroxysmal atrial fibrillation: Secondary | ICD-10-CM

## 2022-06-21 MED ORDER — FLECAINIDE ACETATE 50 MG PO TABS: 25.0000 mg | ORAL_TABLET | Freq: Two times a day (BID) | ORAL | 2 refills | Status: DC

## 2022-06-21 NOTE — Progress Notes (Signed)
Re-entered CT orders under Dr. Truett Perna.

## 2022-06-21 NOTE — Progress Notes (Signed)
Called lab to add T4 to labs

## 2022-06-22 IMAGING — CR DG CHEST 2V
2 series · 2 of 2 positions shown · non-contrast
Comparison: CT chest 01/15/2019

CLINICAL DATA: Preoperative chest x-ray.

EXAM:
CHEST - 2 VIEW

[w chest pa]
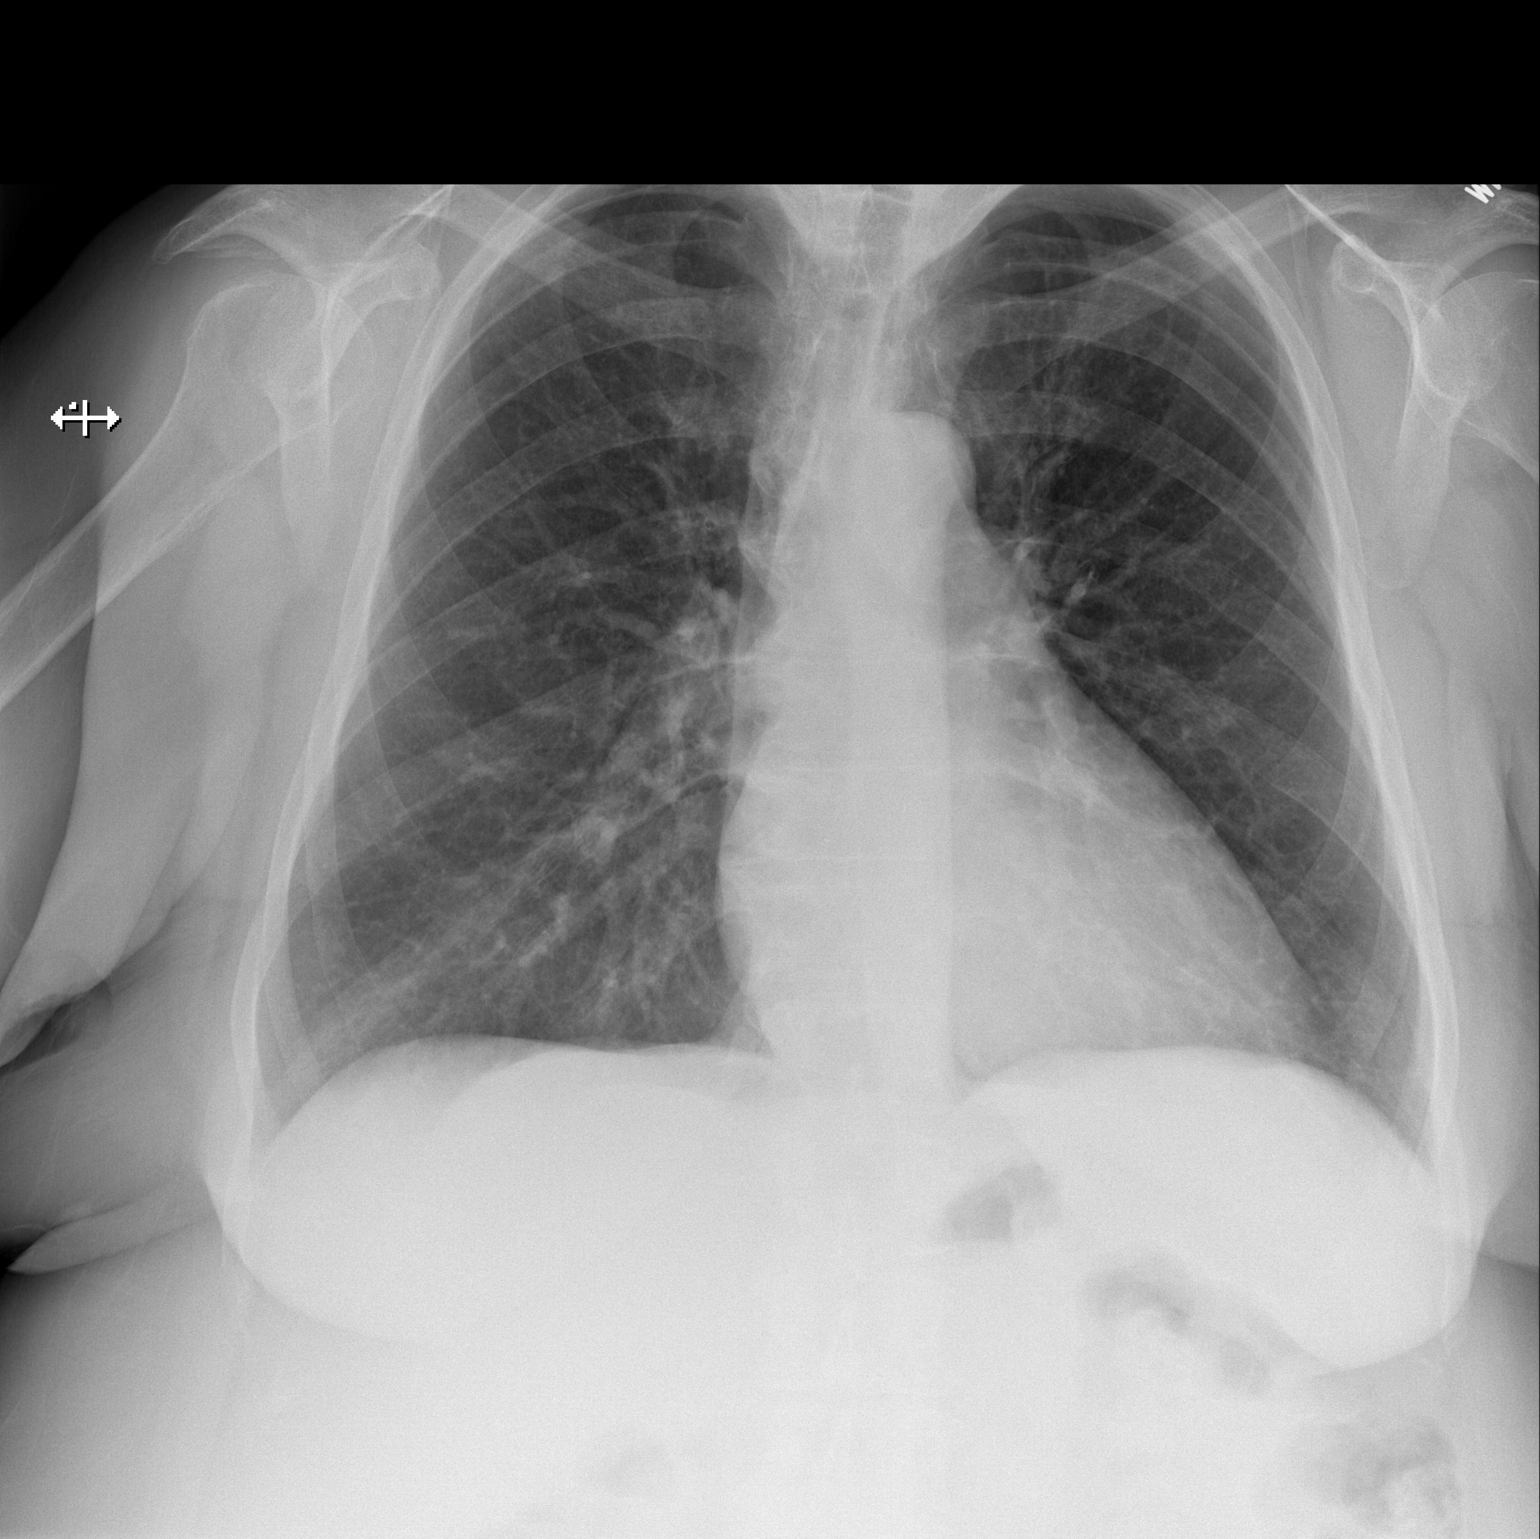

[w chest lat]
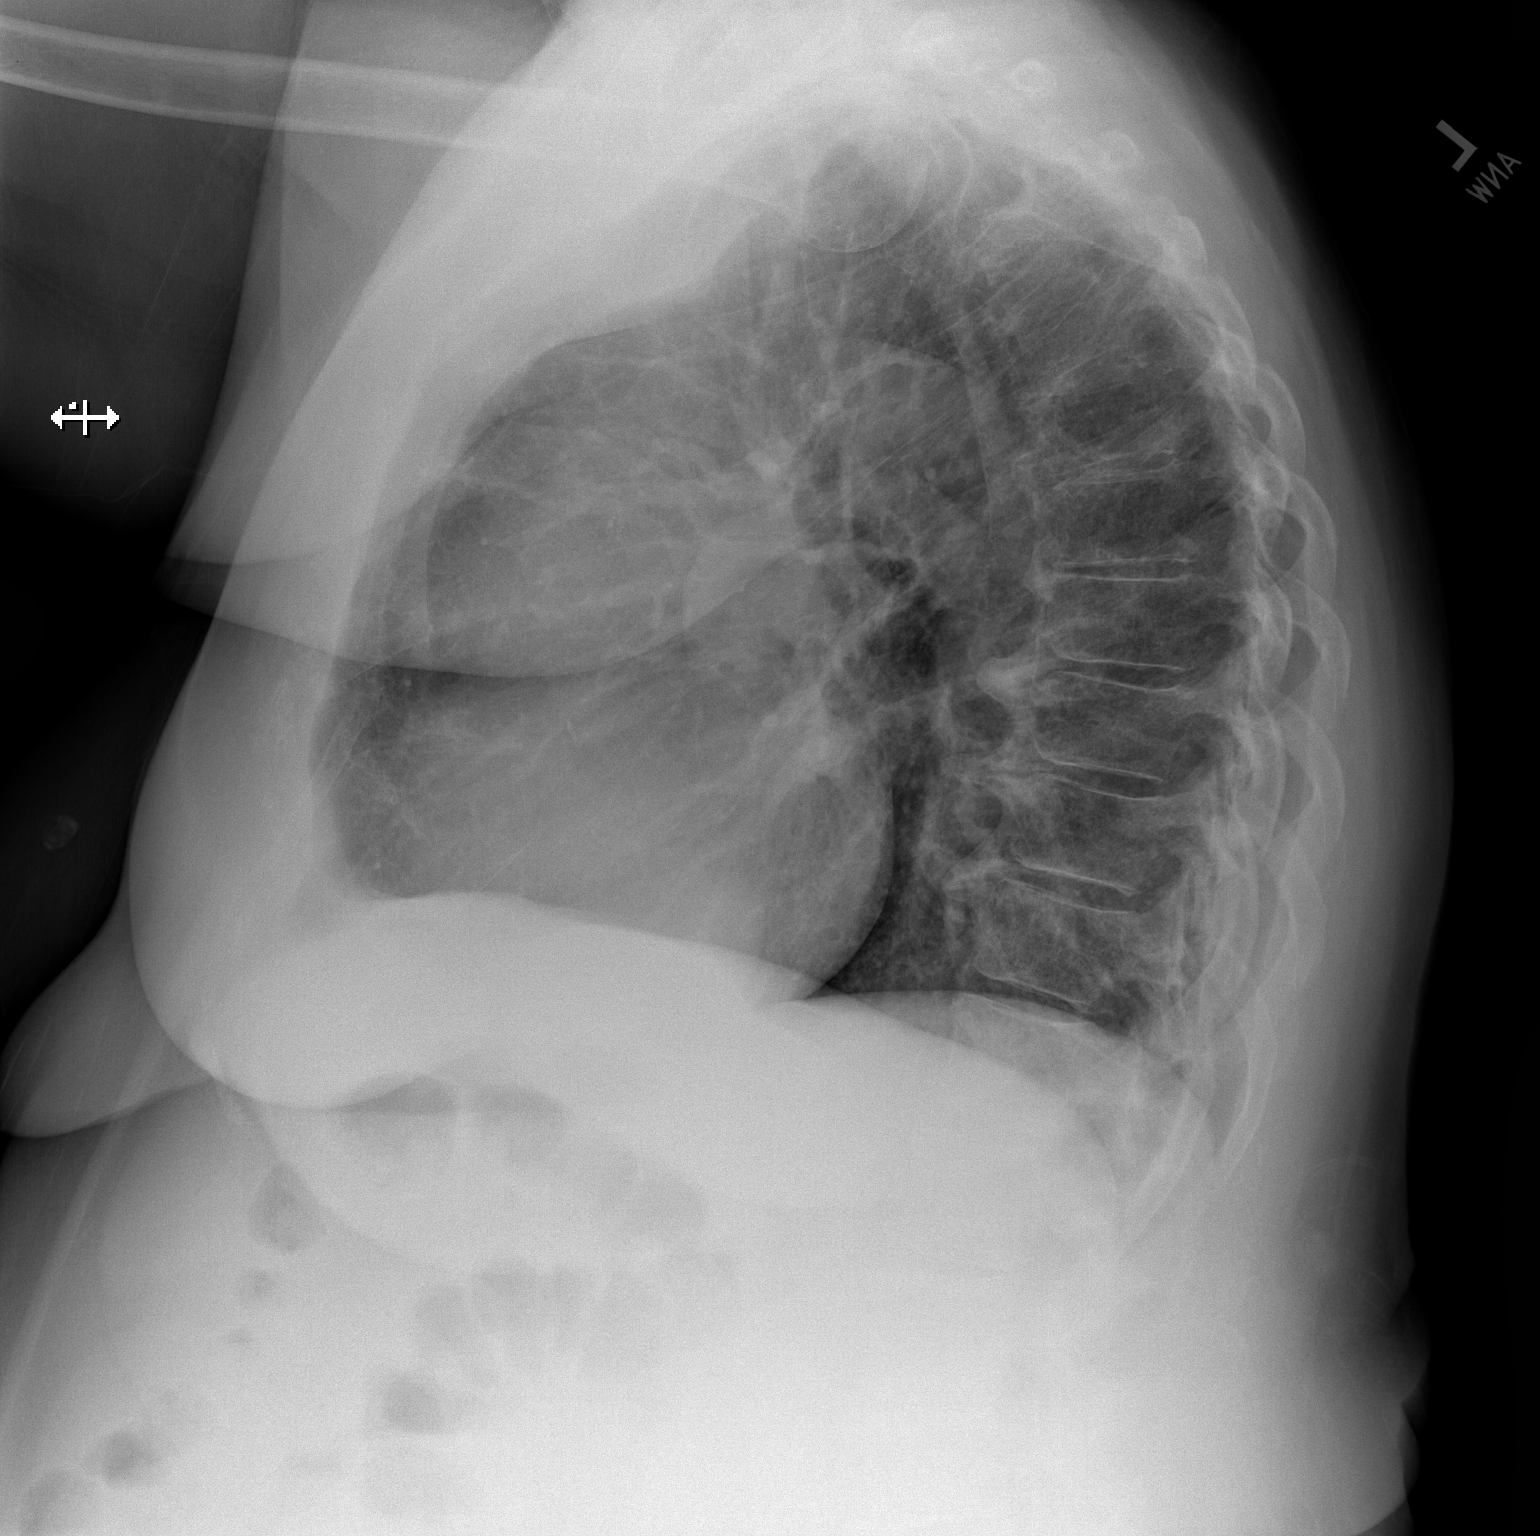

[2 of 2 positions shown; findings below may reference images not displayed]

FINDINGS: Cardiomediastinal silhouette is within normal limits. There is no
focal airspace disease. There is no large pleural effusion or
visible pneumothorax. There are multilevel degenerative changes of
the thoracic spine. No acute osseous abnormality.
IMPRESSION: No evidence of acute cardiopulmonary disease.

## 2022-06-23 LAB — T4: T4, Total: 8.3 ug/dL (ref 4.5–12.0)

## 2022-06-26 ENCOUNTER — Other Ambulatory Visit: Payer: Self-pay | Admitting: *Deleted

## 2022-06-26 DIAGNOSIS — I4510 Unspecified right bundle-branch block: Secondary | ICD-10-CM

## 2022-06-26 DIAGNOSIS — I10 Essential (primary) hypertension: Secondary | ICD-10-CM

## 2022-06-26 DIAGNOSIS — I480 Paroxysmal atrial fibrillation: Secondary | ICD-10-CM

## 2022-06-26 DIAGNOSIS — E7800 Pure hypercholesterolemia, unspecified: Secondary | ICD-10-CM

## 2022-06-26 MED ORDER — FLECAINIDE ACETATE 50 MG PO TABS: 25.0000 mg | ORAL_TABLET | Freq: Two times a day (BID) | ORAL | 2 refills | Status: DC

## 2022-06-26 NOTE — Addendum Note (Signed)
Addended by: Burnetta Sabin on: 06/26/2022 08:04 AM   Modules accepted: Orders

## 2022-07-05 ENCOUNTER — Ambulatory Visit: Payer: Medicare Other | Attending: Cardiology

## 2022-07-05 DIAGNOSIS — Z5181 Encounter for therapeutic drug level monitoring: Secondary | ICD-10-CM | POA: Diagnosis not present

## 2022-07-05 DIAGNOSIS — I480 Paroxysmal atrial fibrillation: Secondary | ICD-10-CM | POA: Diagnosis not present

## 2022-07-05 DIAGNOSIS — Z7189 Other specified counseling: Secondary | ICD-10-CM

## 2022-07-05 DIAGNOSIS — I4819 Other persistent atrial fibrillation: Secondary | ICD-10-CM | POA: Diagnosis not present

## 2022-07-05 LAB — POCT INR: INR: 3 (ref 2.0–3.0)

## 2022-07-05 NOTE — Patient Instructions (Signed)
Description   Continue taking warfarin 1/2 tablet daily except for 1 tablet on Monday.  Recheck INR in 6 weeks. Stay consistent with greens (2-3 servings per week).  Call our office if you have any questions or if placed on any new medications [phone].      

## 2022-07-18 ENCOUNTER — Ambulatory Visit: Payer: Medicare Other | Admitting: Internal Medicine

## 2022-07-18 ENCOUNTER — Other Ambulatory Visit: Payer: Medicare Other

## 2022-07-31 ENCOUNTER — Other Ambulatory Visit: Payer: Self-pay | Admitting: Cardiology

## 2022-08-01 DIAGNOSIS — E119 Type 2 diabetes mellitus without complications: Secondary | ICD-10-CM | POA: Diagnosis not present

## 2022-08-01 DIAGNOSIS — H25813 Combined forms of age-related cataract, bilateral: Secondary | ICD-10-CM | POA: Diagnosis not present

## 2022-08-03 ENCOUNTER — Ambulatory Visit: Payer: Medicare Other | Admitting: Allergy

## 2022-08-16 ENCOUNTER — Ambulatory Visit: Payer: Medicare Other

## 2022-08-16 ENCOUNTER — Ambulatory Visit: Payer: Medicare Other | Attending: Cardiology | Admitting: *Deleted

## 2022-08-16 DIAGNOSIS — I480 Paroxysmal atrial fibrillation: Secondary | ICD-10-CM | POA: Diagnosis not present

## 2022-08-16 DIAGNOSIS — I4819 Other persistent atrial fibrillation: Secondary | ICD-10-CM | POA: Diagnosis not present

## 2022-08-16 DIAGNOSIS — Z7189 Other specified counseling: Secondary | ICD-10-CM | POA: Diagnosis not present

## 2022-08-16 DIAGNOSIS — Z5181 Encounter for therapeutic drug level monitoring: Secondary | ICD-10-CM | POA: Diagnosis not present

## 2022-08-16 LAB — POCT INR: INR: 2.4 (ref 2.0–3.0)

## 2022-08-16 NOTE — Patient Instructions (Signed)
Description   Continue taking warfarin 1/2 tablet daily except for 1 tablet on Monday.  Recheck INR in 6 weeks. Stay consistent with greens (2-3 servings per week).  Call our office if you have any questions or if placed on any new medications [phone].      

## 2022-08-18 ENCOUNTER — Ambulatory Visit (HOSPITAL_BASED_OUTPATIENT_CLINIC_OR_DEPARTMENT_OTHER)
Admission: RE | Admit: 2022-08-18 | Discharge: 2022-08-18 | Disposition: A | Discharge status: Home / Self Care | Payer: Medicare Other | Source: Ambulatory Visit | Attending: Oncology | Admitting: Oncology

## 2022-08-18 DIAGNOSIS — D49511 Neoplasm of unspecified behavior of right kidney: Secondary | ICD-10-CM

## 2022-08-18 DIAGNOSIS — D171 Benign lipomatous neoplasm of skin and subcutaneous tissue of trunk: Secondary | ICD-10-CM | POA: Diagnosis not present

## 2022-08-18 DIAGNOSIS — R93421 Abnormal radiologic findings on diagnostic imaging of right kidney: Secondary | ICD-10-CM | POA: Diagnosis not present

## 2022-08-18 DIAGNOSIS — I700 Atherosclerosis of aorta: Secondary | ICD-10-CM | POA: Diagnosis not present

## 2022-08-18 DIAGNOSIS — C649 Malignant neoplasm of unspecified kidney, except renal pelvis: Secondary | ICD-10-CM | POA: Diagnosis not present

## 2022-08-23 DIAGNOSIS — M47816 Spondylosis without myelopathy or radiculopathy, lumbar region: Secondary | ICD-10-CM | POA: Diagnosis not present

## 2022-08-23 DIAGNOSIS — M4316 Spondylolisthesis, lumbar region: Secondary | ICD-10-CM | POA: Diagnosis not present

## 2022-08-24 ENCOUNTER — Inpatient Hospital Stay: Payer: Medicare Other

## 2022-08-24 ENCOUNTER — Inpatient Hospital Stay: Payer: Medicare Other | Attending: Oncology

## 2022-08-24 VITALS — BP 155/52 | HR 63 | Temp 97.6°F | Resp 20

## 2022-08-24 DIAGNOSIS — E039 Hypothyroidism, unspecified: Secondary | ICD-10-CM | POA: Insufficient documentation

## 2022-08-24 DIAGNOSIS — M545 Low back pain, unspecified: Secondary | ICD-10-CM | POA: Insufficient documentation

## 2022-08-24 DIAGNOSIS — G8929 Other chronic pain: Secondary | ICD-10-CM | POA: Insufficient documentation

## 2022-08-24 DIAGNOSIS — E119 Type 2 diabetes mellitus without complications: Secondary | ICD-10-CM | POA: Diagnosis not present

## 2022-08-24 DIAGNOSIS — Z87891 Personal history of nicotine dependence: Secondary | ICD-10-CM | POA: Insufficient documentation

## 2022-08-24 DIAGNOSIS — Z85528 Personal history of other malignant neoplasm of kidney: Secondary | ICD-10-CM | POA: Insufficient documentation

## 2022-08-24 DIAGNOSIS — Z905 Acquired absence of kidney: Secondary | ICD-10-CM | POA: Insufficient documentation

## 2022-08-24 DIAGNOSIS — Z95828 Presence of other vascular implants and grafts: Secondary | ICD-10-CM

## 2022-08-24 DIAGNOSIS — I4891 Unspecified atrial fibrillation: Secondary | ICD-10-CM | POA: Insufficient documentation

## 2022-08-24 DIAGNOSIS — Z452 Encounter for adjustment and management of vascular access device: Secondary | ICD-10-CM | POA: Diagnosis not present

## 2022-08-24 DIAGNOSIS — D49511 Neoplasm of unspecified behavior of right kidney: Secondary | ICD-10-CM

## 2022-08-24 LAB — CBC WITH DIFFERENTIAL (CANCER CENTER ONLY)
Abs Immature Granulocytes: 0.03 10*3/uL (ref 0.00–0.07)
Basophils Absolute: 0 10*3/uL (ref 0.0–0.1)
Basophils Relative: 1 %
Eosinophils Absolute: 0.2 10*3/uL (ref 0.0–0.5)
Eosinophils Relative: 4 %
HCT: 32.6 % — ABNORMAL LOW (ref 36.0–46.0)
Hemoglobin: 10.5 g/dL — ABNORMAL LOW (ref 12.0–15.0)
Immature Granulocytes: 1 %
Lymphocytes Relative: 25 %
Lymphs Abs: 1.5 10*3/uL (ref 0.7–4.0)
MCH: 27.6 pg (ref 26.0–34.0)
MCHC: 32.2 g/dL (ref 30.0–36.0)
MCV: 85.8 fL (ref 80.0–100.0)
Monocytes Absolute: 0.5 10*3/uL (ref 0.1–1.0)
Monocytes Relative: 9 %
Neutro Abs: 3.7 10*3/uL (ref 1.7–7.7)
Neutrophils Relative %: 60 %
Platelet Count: 276 10*3/uL (ref 150–400)
RBC: 3.8 MIL/uL — ABNORMAL LOW (ref 3.87–5.11)
RDW: 13 % (ref 11.5–15.5)
WBC Count: 6 10*3/uL (ref 4.0–10.5)
nRBC: 0 % (ref 0.0–0.2)

## 2022-08-24 LAB — CMP (CANCER CENTER ONLY)
ALT: 10 U/L (ref 0–44)
AST: 12 U/L — ABNORMAL LOW (ref 15–41)
Albumin: 3.9 g/dL (ref 3.5–5.0)
Alkaline Phosphatase: 54 U/L (ref 38–126)
Anion gap: 8 (ref 5–15)
BUN: 32 mg/dL — ABNORMAL HIGH (ref 8–23)
CO2: 25 mmol/L (ref 22–32)
Calcium: 9.4 mg/dL (ref 8.9–10.3)
Chloride: 103 mmol/L (ref 98–111)
Creatinine: 0.85 mg/dL (ref 0.44–1.00)
GFR, Estimated: >60 mL/min (ref >60)
Glucose, Bld: 242 mg/dL — ABNORMAL HIGH (ref 70–99)
Potassium: 4 mmol/L (ref 3.5–5.1)
Sodium: 136 mmol/L (ref 135–145)
Total Bilirubin: 0.3 mg/dL (ref 0.3–1.2)
Total Protein: 6.4 g/dL — ABNORMAL LOW (ref 6.5–8.1)

## 2022-08-24 LAB — TSH: TSH: 6.054 u[IU]/mL — ABNORMAL HIGH (ref 0.350–4.500)

## 2022-08-24 MED ORDER — HEPARIN SOD (PORK) LOCK FLUSH 100 UNIT/ML IV SOLN
500.0000 [IU] | Freq: Once | INTRAVENOUS | Status: AC
  Administered 2022-08-24: 500 [IU] via INTRAVENOUS

## 2022-08-24 MED ORDER — SODIUM CHLORIDE 0.9% FLUSH
10.0000 mL | Freq: Once | INTRAVENOUS | Status: AC
  Administered 2022-08-24: 10 mL via INTRAVENOUS

## 2022-08-24 NOTE — Patient Instructions (Signed)

## 2022-08-31 ENCOUNTER — Inpatient Hospital Stay: Payer: Medicare Other | Admitting: Oncology

## 2022-08-31 VITALS — BP 150/62 | HR 72 | Temp 98.1°F | Resp 18 | Ht 63.0 in | Wt 203.6 lb

## 2022-08-31 DIAGNOSIS — D649 Anemia, unspecified: Secondary | ICD-10-CM

## 2022-08-31 DIAGNOSIS — D49511 Neoplasm of unspecified behavior of right kidney: Secondary | ICD-10-CM | POA: Diagnosis not present

## 2022-08-31 DIAGNOSIS — E119 Type 2 diabetes mellitus without complications: Secondary | ICD-10-CM | POA: Diagnosis not present

## 2022-08-31 DIAGNOSIS — Z905 Acquired absence of kidney: Secondary | ICD-10-CM | POA: Diagnosis not present

## 2022-08-31 DIAGNOSIS — M545 Low back pain, unspecified: Secondary | ICD-10-CM | POA: Diagnosis not present

## 2022-08-31 DIAGNOSIS — Z85528 Personal history of other malignant neoplasm of kidney: Secondary | ICD-10-CM | POA: Diagnosis not present

## 2022-08-31 DIAGNOSIS — I4891 Unspecified atrial fibrillation: Secondary | ICD-10-CM | POA: Diagnosis not present

## 2022-08-31 DIAGNOSIS — E039 Hypothyroidism, unspecified: Secondary | ICD-10-CM | POA: Diagnosis not present

## 2022-08-31 DIAGNOSIS — Z452 Encounter for adjustment and management of vascular access device: Secondary | ICD-10-CM | POA: Diagnosis not present

## 2022-08-31 DIAGNOSIS — Z87891 Personal history of nicotine dependence: Secondary | ICD-10-CM | POA: Diagnosis not present

## 2022-08-31 DIAGNOSIS — G8929 Other chronic pain: Secondary | ICD-10-CM | POA: Diagnosis not present

## 2022-08-31 NOTE — Progress Notes (Signed)
[facility] OFFICE PROGRESS NOTE   Diagnosis: Renal cell carcinoma  INTERVAL HISTORY:   Latasha Hodges was noted to have a right renal mass on an MRI when she was undergoing evaluation for low back pain 2021.  An abdomen MRI confirmed a right renal mass with evidence of tumor thrombus in the right renal vein.  There was a second lesion in the inferior right kidney concerning for a second primary.  A 9 mm enhancing lymph node was noted in the posterior right renal hilar region. She underwent a right nephrectomy on 01/29/2020.  The pathology confirmed a 7 cm clear-cell carcinoma, grade 3 with extension into the renal vein, perirenal capsule, and renal sinus fat (pT3a) with metastatic clear-cell carcinoma and 1 regional lymph node (pN1) There was a separate 1.5 cm renal cell carcinoma.  The resection margins were negative.  She was referred to Dr. Clelia Croft and received 6 months of adjuvant pembrolizumab beginning 03/31/2020 and last given on 08/11/2020.  The pembrolizumab was discontinued secondary to poor tolerance.  She reports hair thinning, loosening of teeth/gum infection, and a rash.  She reports generally feeling well.   Past medical history: Atrial fibrillation 2.  Chronic low back pain 3.  Diabetes 4.  Cataracts 5.  G3 P2, 1 miscarriage 6.  Hypothyroidism  Past surgical history:  Right nephrectomy 2021 Laminectomy June 2022 Wrist fracture surgery Family history.  Her mother had head and neck cancer  Social history: She lives alone in [place].  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  She quit smoking cigarettes in 1983.  Rare alcohol use.  Review of systems: Positives-dysphagia when eating chicken for years, rash while being treated with pembrolizumab, dry skin with pruritus, chronic back pain  A complete review of systems was otherwise negative  Objective:  Vital signs in last 24 hours:  Blood pressure (!) 150/62, pulse  72, temperature 98.1 F (36.7 C), temperature source Oral, resp. rate 18, height 5\' 3"  (1.6 m), weight 203 lb 9.6 oz (92.4 kg), SpO2 98%.    Lymphatics: No cervical, supraclavicular, axillary, or inguinal nodes Resp: Lungs clear bilaterally Cardio: Regular rate and rhythm GI: No hepatosplenomegaly, no mass, nontender Vascular: No leg edema Neuro: Alert and oriented, the motor exam appears intact in the upper and lower extremities bilaterally Skin: Erythema in a sun distribution  Portacath/PICC-without erythema  Lab Results:  Lab Results  Component Value Date   WBC 6.0 08/24/2022   HGB 10.5 (L) 08/24/2022   HCT 32.6 (L) 08/24/2022   MCV 85.8 08/24/2022   PLT 276 08/24/2022   NEUTROABS 3.7 08/24/2022    CMP  Lab Results  Component Value Date   NA 136 08/24/2022   K 4.0 08/24/2022   CL 103 08/24/2022   CO2 25 08/24/2022   GLUCOSE 242 (H) 08/24/2022   BUN 32 (H) 08/24/2022   CREATININE 0.85 08/24/2022   CALCIUM 9.4 08/24/2022   PROT 6.4 (L) 08/24/2022   ALBUMIN 3.9 08/24/2022   AST 12 (L) 08/24/2022   ALT 10 08/24/2022   ALKPHOS 54 08/24/2022   BILITOT 0.3 08/24/2022   GFRNONAA >60 08/24/2022   GFRAA 92 08/20/2017      Imaging: As per HPI, CT images from 08/18/2022 reviewed  Medications: I have reviewed the patient's current medications.   Assessment/Plan: Renal cell carcinoma, right nephrectomy 01/29/2020, clear-cell carcinoma right kidney extending into the right renal vein, renal capsule, and renal sinus fat,pT3apN1, 1/1 lymph node, grade 3, separate 0.5  cm renal cell carcinoma CTs 08/18/2022-no evidence of recurrent disease, tiny peripheral pulmonary nodules-stable, homogenous macroscopic fat density mass in the right mid abdomen favored to represent a lipoma-unchanged 2.   Hypothyroidism 3.   Chronic mild anemia 4.   Diabetes 5.   Chronic back pain 6.  Atrial fibrillation 7.  Port-A-Cath in place   Ms. Dettore remains in clinical remission from renal  cell carcinoma.  She is now 2 and half years out from a right nephrectomy for treatment of renal cell carcinoma.  I reviewed the CT findings with her.  She will return for an office visit and surveillance CTs in 6 months.  She would like to keep the Port-A-Cath in place.  She will return for a Port-A-Cath flush every 8 weeks.  The TSH is mildly elevated.  We will check a TSH and T4 when she returns for a Port-A-Cath flush.  She appears to have chronic mild anemia.  We will evaluate the anemia further if the hemoglobin falls.  The anemia could be related to chronic disease or bleeding while anticoagulation therapy.  Approximately 45 minutes were spent with the patient today.  Majority of the time was used for counseling and coordination of care.  Thornton Papas, MD  08/31/2022  5:14 PM

## 2022-09-11 DIAGNOSIS — M47816 Spondylosis without myelopathy or radiculopathy, lumbar region: Secondary | ICD-10-CM | POA: Diagnosis not present

## 2022-10-02 ENCOUNTER — Ambulatory Visit: Payer: Medicare Other | Attending: Cardiology | Admitting: *Deleted

## 2022-10-02 DIAGNOSIS — I480 Paroxysmal atrial fibrillation: Secondary | ICD-10-CM

## 2022-10-02 DIAGNOSIS — Z7189 Other specified counseling: Secondary | ICD-10-CM

## 2022-10-02 DIAGNOSIS — Z5181 Encounter for therapeutic drug level monitoring: Secondary | ICD-10-CM | POA: Diagnosis not present

## 2022-10-02 DIAGNOSIS — I4819 Other persistent atrial fibrillation: Secondary | ICD-10-CM

## 2022-10-02 LAB — POCT INR: INR: 2.6 (ref 2.0–3.0)

## 2022-10-02 NOTE — Patient Instructions (Signed)
Description   Continue taking warfarin 1/2 tablet daily except for 1 tablet on Monday.  Recheck INR in 6 weeks. Stay consistent with greens (2-3 servings per week).  Call our office if you have any questions or if placed on any new medications [phone].

## 2022-10-18 ENCOUNTER — Inpatient Hospital Stay: Payer: Medicare Other

## 2022-10-18 ENCOUNTER — Other Ambulatory Visit: Payer: Self-pay | Admitting: *Deleted

## 2022-10-18 ENCOUNTER — Inpatient Hospital Stay: Payer: Medicare Other | Attending: Oncology

## 2022-10-18 VITALS — BP 132/64 | HR 64 | Temp 97.8°F | Resp 18

## 2022-10-18 DIAGNOSIS — D649 Anemia, unspecified: Secondary | ICD-10-CM

## 2022-10-18 DIAGNOSIS — D49511 Neoplasm of unspecified behavior of right kidney: Secondary | ICD-10-CM

## 2022-10-18 DIAGNOSIS — Z85528 Personal history of other malignant neoplasm of kidney: Secondary | ICD-10-CM | POA: Insufficient documentation

## 2022-10-18 DIAGNOSIS — Z95828 Presence of other vascular implants and grafts: Secondary | ICD-10-CM

## 2022-10-18 LAB — TSH: TSH: 4.935 u[IU]/mL — ABNORMAL HIGH (ref 0.350–4.500)

## 2022-10-18 LAB — BASIC METABOLIC PANEL - CANCER CENTER ONLY
Anion gap: 15 (ref 5–15)
BUN: 47 mg/dL — ABNORMAL HIGH (ref 8–23)
CO2: 23 mmol/L (ref 22–32)
Calcium: 9.8 mg/dL (ref 8.9–10.3)
Chloride: 102 mmol/L (ref 98–111)
Creatinine: 1.14 mg/dL — ABNORMAL HIGH (ref 0.44–1.00)
GFR, Estimated: 50 mL/min — ABNORMAL LOW (ref >60)
Glucose, Bld: 250 mg/dL — ABNORMAL HIGH (ref 70–99)
Potassium: 4.1 mmol/L (ref 3.5–5.1)
Sodium: 140 mmol/L (ref 135–145)

## 2022-10-18 LAB — FERRITIN: Ferritin: 25 ng/mL (ref 11–307)

## 2022-10-18 MED ORDER — HEPARIN SOD (PORK) LOCK FLUSH 100 UNIT/ML IV SOLN: 500.0000 [IU] | Freq: Once | INTRAVENOUS | Status: DC

## 2022-10-18 MED ORDER — SODIUM CHLORIDE 0.9% FLUSH: 10.0000 mL | Freq: Once | INTRAVENOUS | Status: DC

## 2022-10-18 NOTE — Patient Instructions (Signed)

## 2022-10-19 ENCOUNTER — Other Ambulatory Visit: Payer: Self-pay | Admitting: *Deleted

## 2022-10-19 ENCOUNTER — Inpatient Hospital Stay: Payer: Medicare Other

## 2022-10-19 DIAGNOSIS — D49511 Neoplasm of unspecified behavior of right kidney: Secondary | ICD-10-CM

## 2022-10-19 LAB — T4: T4, Total: 9.4 ug/dL (ref 4.5–12.0)

## 2022-11-01 ENCOUNTER — Other Ambulatory Visit: Payer: Self-pay | Admitting: Cardiology

## 2022-11-13 ENCOUNTER — Ambulatory Visit: Payer: Medicare Other

## 2022-11-21 ENCOUNTER — Ambulatory Visit: Payer: Medicare Other | Attending: Cardiovascular Disease

## 2022-11-21 ENCOUNTER — Telehealth: Payer: Self-pay

## 2022-11-21 DIAGNOSIS — I4819 Other persistent atrial fibrillation: Secondary | ICD-10-CM | POA: Diagnosis not present

## 2022-11-21 DIAGNOSIS — Z5181 Encounter for therapeutic drug level monitoring: Secondary | ICD-10-CM

## 2022-11-21 DIAGNOSIS — I480 Paroxysmal atrial fibrillation: Secondary | ICD-10-CM | POA: Diagnosis not present

## 2022-11-21 DIAGNOSIS — Z7189 Other specified counseling: Secondary | ICD-10-CM | POA: Diagnosis not present

## 2022-11-21 LAB — POCT INR: INR: 1.9 — AB (ref 2.0–3.0)

## 2022-11-21 NOTE — Patient Instructions (Signed)
TAKE 1 TABLET TONIGHT THEN Continue taking warfarin 1/2 tablet daily except for 1 tablet on Monday.  Recheck INR in 6 weeks. Stay consistent with greens (2-3 servings per week).  Call our office if you have any questions or if placed on any new medications [phone].

## 2022-11-21 NOTE — Telephone Encounter (Signed)
Lpmtcb to reschedule INR appt

## 2022-11-22 ENCOUNTER — Ambulatory Visit (HOSPITAL_COMMUNITY)
Admission: RE | Admit: 2022-11-22 | Discharge: 2022-11-22 | Disposition: A | Discharge status: Home / Self Care | Payer: Medicare Other | Source: Ambulatory Visit | Attending: Internal Medicine | Admitting: Internal Medicine

## 2022-11-22 VITALS — BP 152/66 | HR 66 | Ht 63.0 in | Wt 203.6 lb

## 2022-11-22 DIAGNOSIS — I4510 Unspecified right bundle-branch block: Secondary | ICD-10-CM | POA: Diagnosis not present

## 2022-11-22 DIAGNOSIS — Z7984 Long term (current) use of oral hypoglycemic drugs: Secondary | ICD-10-CM | POA: Insufficient documentation

## 2022-11-22 DIAGNOSIS — Z7985 Long-term (current) use of injectable non-insulin antidiabetic drugs: Secondary | ICD-10-CM | POA: Insufficient documentation

## 2022-11-22 DIAGNOSIS — Z79899 Other long term (current) drug therapy: Secondary | ICD-10-CM | POA: Insufficient documentation

## 2022-11-22 DIAGNOSIS — E1122 Type 2 diabetes mellitus with diabetic chronic kidney disease: Secondary | ICD-10-CM | POA: Insufficient documentation

## 2022-11-22 DIAGNOSIS — N183 Chronic kidney disease, stage 3 unspecified: Secondary | ICD-10-CM | POA: Diagnosis not present

## 2022-11-22 DIAGNOSIS — I351 Nonrheumatic aortic (valve) insufficiency: Secondary | ICD-10-CM | POA: Diagnosis not present

## 2022-11-22 DIAGNOSIS — I700 Atherosclerosis of aorta: Secondary | ICD-10-CM | POA: Insufficient documentation

## 2022-11-22 DIAGNOSIS — I129 Hypertensive chronic kidney disease with stage 1 through stage 4 chronic kidney disease, or unspecified chronic kidney disease: Secondary | ICD-10-CM | POA: Diagnosis not present

## 2022-11-22 DIAGNOSIS — I4891 Unspecified atrial fibrillation: Secondary | ICD-10-CM

## 2022-11-22 DIAGNOSIS — I4819 Other persistent atrial fibrillation: Secondary | ICD-10-CM | POA: Diagnosis not present

## 2022-11-22 DIAGNOSIS — Z7901 Long term (current) use of anticoagulants: Secondary | ICD-10-CM | POA: Insufficient documentation

## 2022-11-22 NOTE — Progress Notes (Signed)
Primary Care Physician: Ollen Bowl, MD Referring Physician: Dr. Jeneen Rinks is a 77 y.o. female with a h/o  hx of persistent atrial fibrillation on chronic anticoagulation, chronic RBBB, DM, mild aortic insufficiency coronary artery calcifications and kidney cancer status post right nephrectomy and chemotherapy with CKD stage II with coronary artery calcium score 47 and HTN.     She had a nuclear stress test done 7/23 for flecainide showed no ischemia.  2D echo 6/23 showed normal LVF EF 60-65% with mild AR.   She is here today in the afib clinic as Dr. Mayford Knife noted on last visit some interval change of pr and qrs intervals and was questioning if pt should stay on flecainide.  EKG today shows SR with first degree AVB and RBBB.  She is compliant with her meds and is tolerating meds with no SE. She states that her afib has been very quiet and she has not noted any afib for 10-15 years.   On follow up today, she is in SR currently. She was seen on 3/5 and flecainide was reduced to 25 mg BID due to prolonged 1st degree AV block and RBBB. Repeat ECG on 3/12 was stable. She has not had any episodes of Afib since last office visit or overall in a while. She is compliant with flecainide, Toprol, and coumadin. No bleeding concerns.  On follow up, she is currently in NSR. Since last office visit, she has had no episodes of Afib. She has overall very low burden with no Afib over the past decade. She feels well overall. She is compliant with flecainide, Toprol, and coumadin. No bleeding concerns on coumadin.   Today, she denies symptoms of palpitations, chest pain, shortness of breath, orthopnea, PND, lower extremity edema, dizziness, presyncope, syncope, or neurologic sequela. The patient is tolerating medications without difficulties and is otherwise without complaint today.   Past Medical History:  Diagnosis Date   Acquired solitary kidney    left side;   s/p right nephrectomy 12/  2021   Agatston coronary artery calcium score less than 100    Coronary calcium score was 27 which is low at 54th percentile for age and sex matched controls.   Anticoagulated on Coumadin    long-term;  managed by cardiology   Aortic atherosclerosis (HCC)    Arthritis    Chronic kidney disease (CKD), stage III (moderate) (HCC)    Coronary artery calcification    Hyperlipidemia, mixed    Hypertension    Left wrist fracture    Malignant neoplasm of kidney, right (HCC) 01/2020   oncologist--- dr Clelia Croft;  dx 12/ 2021  Stage IIIa,  Clear cell type;  01-29-2020  s/p robotic right radical nephrectomy (one posisitve node);  chemo 03-31-2020 to  08-11-2020   Mild aortic insufficiency    Persistent atrial fibrillation [facility])    cardiologist--- dr t. turner;  nuclear sress test-- 08-15-2017 low risk normal perfusion, nuclear ef 61%;  event monitor 12-18-2017 in epic   RBBB (right bundle branch block)    chronic   Type 2 diabetes mellitus (HCC)    Past Surgical History:  Procedure Laterality Date   COLONOSCOPY  12/29/2010   Procedure: COLONOSCOPY;  Surgeon: Rob Bunting, MD;  Location: [facility];  Service: Endoscopy;  Laterality: N/A;   HARDWARE REMOVAL Left 05/02/2021   Procedure: left wrist removal of hardware- dorsal spanning plate;  Surgeon: Gomez Cleverly, MD;  Location: [facility];  Service: Orthopedics;  Laterality:  Left;   IR IMAGING GUIDED PORT INSERTION  07/08/2020   LUMBAR LAMINECTOMY/DECOMPRESSION MICRODISCECTOMY Left 07/14/2020   Procedure: Laminectomy and Foraminotomy - Lumbar three-Lumbar four- Lumbar four-Lumbar five - left with sublaminar decompression;  Surgeon: Tia Alert, MD;  Location: [facility] OR;  Service: Neurosurgery;  Laterality: Left;   ORIF WRIST FRACTURE Left 02/10/2021   Procedure: OPEN REDUCTION INTERNAL FIXATION (ORIF) WRIST FRACTURE;  Surgeon: Gomez Cleverly, MD;  Location: [facility];  Service: Orthopedics;  Laterality: Left;  with MAC   ROBOT ASSISTED  LAPAROSCOPIC NEPHRECTOMY N/A 01/29/2020   Procedure: XI ROBOTIC ASSISTED LAPAROSCOPIC RADICAL NEPHRECTOMY;  Surgeon: Heloise Purpura, MD;  Location: [facility];  Service: Urology;  Laterality: N/A;   TUBAL LIGATION  1980    Current Outpatient Medications  Medication Sig Dispense Refill   fenofibrate micronized (LOFIBRA) 200 MG capsule TAKE 1 CAPSULE BY MOUTH DAILY BEFORE AND BREAKFAST 120 capsule 3   flecainide (TAMBOCOR) 50 MG tablet Take 0.5 tablets (25 mg total) by mouth 2 (two) times daily. 90 tablet 2   levothyroxine (SYNTHROID) 25 MCG tablet Take 25 mcg by mouth daily before breakfast.     lisinopril (ZESTRIL) 20 MG tablet TAKE 1 TABLET(20 MG) BY MOUTH DAILY.  Please call [phone] to schedule a February appointment for future refills. Thank you. 90 tablet 0   metFORMIN (GLUCOPHAGE) 1000 MG tablet Take 1,000 mg by mouth daily with breakfast.  0   metoprolol succinate (TOPROL-XL) 50 MG 24 hr tablet TAKE 1 TABLET(50 MG) BY MOUTH DAILY 90 tablet 3   OZEMPIC, 0.25 OR 0.5 MG/DOSE, 2 MG/3ML SOPN Inject into the skin once a week. Not sure the dosage     Probiotic Product (PROBIOTIC PO) Take 1 tablet by mouth daily. Magnesium citrate, has several ingredients in it- not sure what all is in it per the patient Pure Dentex     rosuvastatin (CRESTOR) 40 MG tablet TAKE 1 TABLET(40 MG) BY MOUTH DAILY 90 tablet 2   warfarin (COUMADIN) 5 MG tablet TAKE 1 TABLET BY MOUTH DAILY AS DIRECTED BY COUMADIN CLINIC 90 tablet 1   diphenhydrAMINE (BENADRYL) 25 mg capsule Take 25 mg by mouth as needed. (Patient not taking: Reported on 11/22/2022)     magnesium oxide (MAG-OX) 400 (240 Mg) MG tablet Take 400 mg by mouth daily. (Patient not taking: Reported on 11/22/2022)     No current facility-administered medications for this encounter.    Allergies  Allergen Reactions   Iodinated Contrast Media Hives, Swelling and Rash   Fish Allergy Other (See Comments)   Iodine Other (See Comments)   Pembrolizumab Other (See  Comments)    Severe gum pain, caused thyroid issues   Shellfish Allergy Other (See Comments)   No [employer] Food Allergies Rash    Seafood   Penicillins Rash    Childhood reaction.   Sulfa Antibiotics Hives and Rash   Theophyllines Rash    ROS- All systems are reviewed and negative except as per the HPI above  Physical Exam: Vitals:   11/22/22 1441  Pulse: 66  Weight: 92.4 kg  Height: 5\' 3"  (1.6 m)    Wt Readings from Last 3 Encounters:  11/22/22 92.4 kg  08/31/22 92.4 kg  05/30/22 92.8 kg    Labs: Lab Results  Component Value Date   NA 140 10/18/2022   K 4.1 10/18/2022   CL 102 10/18/2022   CO2 23 10/18/2022   GLUCOSE 250 (H) 10/18/2022   BUN 47 (H) 10/18/2022   CREATININE 1.14 (H)  10/18/2022   CALCIUM 9.8 10/18/2022   MG 2.1 07/21/2021   Lab Results  Component Value Date   INR 1.9 (A) 11/21/2022   Lab Results  Component Value Date   CHOL 124 09/09/2021   HDL 44 09/09/2021   LDLCALC 41 09/09/2021   TRIG 195 (H) 09/09/2021   GEN- The patient is well appearing, alert and oriented x 3 today.   Neck - no JVD or carotid bruit noted Lungs- Clear to ausculation bilaterally, normal work of breathing Heart- Regular rate and rhythm, no murmurs, rubs or gallops, PMI not laterally displaced Extremities- no clubbing, cyanosis, or edema Skin - no rash or ecchymosis noted   EKG- Vent. rate 66 BPM PR interval 210 ms QRS duration 172 ms QT/QTcB 444/465 ms P-R-T axes 80 4 44 Sinus rhythm with 1st degree A-V block Right bundle branch block Abnormal ECG When compared with ECG of 25-May-2022 15:01, PREVIOUS ECG IS PRESENT  ECHO 08/05/21 1. Left ventricular ejection fraction, by estimation, is 60 to 65%. The  left ventricle has normal function. The left ventricle has no regional  wall motion abnormalities. Left ventricular diastolic parameters are  consistent with Grade I diastolic  dysfunction (impaired relaxation).   2. Right ventricular systolic function is  normal. The right ventricular  size is mildly enlarged.   3. Left atrial size was mildly dilated.   4. The mitral valve is normal in structure. Trivial mitral valve  regurgitation. No evidence of mitral stenosis.   5. The aortic valve is tricuspid. There is moderate calcification of the  aortic valve. Aortic valve regurgitation is mild. Aortic valve  sclerosis/calcification is present, without any evidence of aortic  stenosis.   6. The inferior vena cava is normal in size with greater than 50%  respiratory variability, suggesting right atrial pressure of 3 mmHg.    Assessment and Plan:  1. Afib   She is in SR today.   Flecainide decreased to 25 mg BID on 04/18/22. ECG today shows PR 210 ms and QRS 172 ms overall similar to previous.   PR and QRS today are stable. Continue flecainide 25 mg BID.   In the future, may need to see EP to be considered for an ablation if reducing flecainide  brings on afib as she does not feel she can afford Multaq.   2. CHA2DS2VASc  score of 5 Continue warfarin.   F/u 6 months Afib clinic.   Lake Bells, PA-C Afib Clinic [facility] [address] [place], [place] [address] [phone]

## 2022-12-14 ENCOUNTER — Telehealth: Payer: Self-pay

## 2022-12-14 ENCOUNTER — Inpatient Hospital Stay: Payer: Medicare Other | Attending: Oncology

## 2022-12-14 ENCOUNTER — Inpatient Hospital Stay: Payer: Medicare Other

## 2022-12-14 VITALS — BP 138/55 | HR 62 | Temp 97.8°F | Resp 18

## 2022-12-14 DIAGNOSIS — Z85528 Personal history of other malignant neoplasm of kidney: Secondary | ICD-10-CM | POA: Insufficient documentation

## 2022-12-14 DIAGNOSIS — E039 Hypothyroidism, unspecified: Secondary | ICD-10-CM | POA: Diagnosis not present

## 2022-12-14 DIAGNOSIS — Z7901 Long term (current) use of anticoagulants: Secondary | ICD-10-CM | POA: Diagnosis not present

## 2022-12-14 DIAGNOSIS — I4891 Unspecified atrial fibrillation: Secondary | ICD-10-CM | POA: Diagnosis not present

## 2022-12-14 DIAGNOSIS — Z905 Acquired absence of kidney: Secondary | ICD-10-CM | POA: Insufficient documentation

## 2022-12-14 DIAGNOSIS — Z452 Encounter for adjustment and management of vascular access device: Secondary | ICD-10-CM | POA: Insufficient documentation

## 2022-12-14 DIAGNOSIS — D49511 Neoplasm of unspecified behavior of right kidney: Secondary | ICD-10-CM

## 2022-12-14 DIAGNOSIS — Z95828 Presence of other vascular implants and grafts: Secondary | ICD-10-CM

## 2022-12-14 LAB — BASIC METABOLIC PANEL - CANCER CENTER ONLY
Anion gap: 9 (ref 5–15)
BUN: 36 mg/dL — ABNORMAL HIGH (ref 8–23)
CO2: 26 mmol/L (ref 22–32)
Calcium: 10 mg/dL (ref 8.9–10.3)
Chloride: 101 mmol/L (ref 98–111)
Creatinine: 0.9 mg/dL (ref 0.44–1.00)
GFR, Estimated: >60 mL/min (ref >60)
Glucose, Bld: 230 mg/dL — ABNORMAL HIGH (ref 70–99)
Potassium: 4.1 mmol/L (ref 3.5–5.1)
Sodium: 136 mmol/L (ref 135–145)

## 2022-12-14 LAB — CBC WITH DIFFERENTIAL (CANCER CENTER ONLY)
Abs Immature Granulocytes: 0.05 10*3/uL (ref 0.00–0.07)
Basophils Absolute: 0.1 10*3/uL (ref 0.0–0.1)
Basophils Relative: 1 %
Eosinophils Absolute: 0.3 10*3/uL (ref 0.0–0.5)
Eosinophils Relative: 4 %
HCT: 33.8 % — ABNORMAL LOW (ref 36.0–46.0)
Hemoglobin: 11 g/dL — ABNORMAL LOW (ref 12.0–15.0)
Immature Granulocytes: 1 %
Lymphocytes Relative: 28 %
Lymphs Abs: 1.9 10*3/uL (ref 0.7–4.0)
MCH: 27.6 pg (ref 26.0–34.0)
MCHC: 32.5 g/dL (ref 30.0–36.0)
MCV: 84.7 fL (ref 80.0–100.0)
Monocytes Absolute: 0.6 10*3/uL (ref 0.1–1.0)
Monocytes Relative: 9 %
Neutro Abs: 4 10*3/uL (ref 1.7–7.7)
Neutrophils Relative %: 57 %
Platelet Count: 275 10*3/uL (ref 150–400)
RBC: 3.99 MIL/uL (ref 3.87–5.11)
RDW: 13.2 % (ref 11.5–15.5)
WBC Count: 6.9 10*3/uL (ref 4.0–10.5)
nRBC: 0 % (ref 0.0–0.2)

## 2022-12-14 LAB — TSH: TSH: 5.089 u[IU]/mL — ABNORMAL HIGH (ref 0.350–4.500)

## 2022-12-14 MED ORDER — HEPARIN SOD (PORK) LOCK FLUSH 100 UNIT/ML IV SOLN
500.0000 [IU] | Freq: Once | INTRAVENOUS | Status: AC
  Administered 2022-12-14: 500 [IU] via INTRAVENOUS

## 2022-12-14 MED ORDER — SODIUM CHLORIDE 0.9% FLUSH
10.0000 mL | Freq: Once | INTRAVENOUS | Status: AC
Start: 2022-12-14 — End: 2022-12-14
  Administered 2022-12-14: 10 mL via INTRAVENOUS

## 2022-12-14 NOTE — Telephone Encounter (Signed)
-----   Message from Latasha Hodges sent at 12/14/2022  3:25 PM EDT ----- Please call patient hemoglobin is higher, TSH is stable, follow-up as scheduled

## 2022-12-14 NOTE — Telephone Encounter (Signed)
Patient gave verbal understanding and had no further questions or concerns  

## 2022-12-14 NOTE — Patient Instructions (Signed)

## 2022-12-15 DIAGNOSIS — E782 Mixed hyperlipidemia: Secondary | ICD-10-CM | POA: Diagnosis not present

## 2022-12-15 DIAGNOSIS — D638 Anemia in other chronic diseases classified elsewhere: Secondary | ICD-10-CM | POA: Diagnosis not present

## 2022-12-15 DIAGNOSIS — E039 Hypothyroidism, unspecified: Secondary | ICD-10-CM | POA: Diagnosis not present

## 2022-12-15 DIAGNOSIS — I700 Atherosclerosis of aorta: Secondary | ICD-10-CM | POA: Diagnosis not present

## 2022-12-15 DIAGNOSIS — E1165 Type 2 diabetes mellitus with hyperglycemia: Secondary | ICD-10-CM | POA: Diagnosis not present

## 2022-12-15 DIAGNOSIS — I119 Hypertensive heart disease without heart failure: Secondary | ICD-10-CM | POA: Diagnosis not present

## 2022-12-15 DIAGNOSIS — I480 Paroxysmal atrial fibrillation: Secondary | ICD-10-CM | POA: Diagnosis not present

## 2022-12-18 ENCOUNTER — Other Ambulatory Visit: Payer: Self-pay | Admitting: Cardiology

## 2023-01-02 ENCOUNTER — Ambulatory Visit: Payer: Medicare Other | Attending: Cardiology

## 2023-01-02 DIAGNOSIS — Z5181 Encounter for therapeutic drug level monitoring: Secondary | ICD-10-CM

## 2023-01-02 DIAGNOSIS — I480 Paroxysmal atrial fibrillation: Secondary | ICD-10-CM | POA: Diagnosis not present

## 2023-01-02 DIAGNOSIS — Z7189 Other specified counseling: Secondary | ICD-10-CM | POA: Diagnosis not present

## 2023-01-02 DIAGNOSIS — I4819 Other persistent atrial fibrillation: Secondary | ICD-10-CM

## 2023-01-02 LAB — POCT INR: INR: 2.6 (ref 2.0–3.0)

## 2023-01-02 NOTE — Patient Instructions (Signed)
Continue taking warfarin 1/2 tablet daily except for 1 tablet on Monday.  Recheck INR in 6 weeks. Stay consistent with greens (2-3 servings per week).  Call our office if you have any questions or if placed on any new medications [phone].

## 2023-01-03 ENCOUNTER — Telehealth: Payer: Self-pay | Admitting: *Deleted

## 2023-01-03 NOTE — Telephone Encounter (Signed)
Patient walked into the office today and stated she needed to see an Anticoagulation Clinic Nurse. Went out to the [employer] room to see the pt and she stated she was told to drop in to show the medication she is taking to prevent her teeth from falling out and help with dental health.   She showed the bottle and it was Pure Dentex. I was able to look at the ingredients (lactobacillus, tricalcium phosphate, Vitamin B1, Vitamin C, Zinc, Vitamin D3, Calcium and advised it was safe to take with warfarin. She was thankful for the assistance.

## 2023-01-16 ENCOUNTER — Other Ambulatory Visit: Payer: Self-pay

## 2023-01-16 MED ORDER — FENOFIBRATE 200 MG PO CAPS: ORAL_CAPSULE | ORAL | 1 refills | Status: DC

## 2023-02-01 ENCOUNTER — Inpatient Hospital Stay: Payer: Medicare Other | Attending: Oncology

## 2023-02-01 VITALS — BP 147/57 | HR 68 | Temp 97.6°F | Resp 18 | Ht 63.0 in | Wt 203.7 lb

## 2023-02-01 DIAGNOSIS — Z85528 Personal history of other malignant neoplasm of kidney: Secondary | ICD-10-CM | POA: Insufficient documentation

## 2023-02-01 DIAGNOSIS — Z95828 Presence of other vascular implants and grafts: Secondary | ICD-10-CM

## 2023-02-01 DIAGNOSIS — Z452 Encounter for adjustment and management of vascular access device: Secondary | ICD-10-CM | POA: Diagnosis not present

## 2023-02-01 MED ORDER — HEPARIN SOD (PORK) LOCK FLUSH 100 UNIT/ML IV SOLN
500.0000 [IU] | Freq: Once | INTRAVENOUS | Status: AC
  Administered 2023-02-01: 500 [IU] via INTRAVENOUS

## 2023-02-01 MED ORDER — SODIUM CHLORIDE 0.9% FLUSH
10.0000 mL | Freq: Once | INTRAVENOUS | Status: AC
  Administered 2023-02-01: 10 mL via INTRAVENOUS

## 2023-02-01 NOTE — Patient Instructions (Signed)

## 2023-02-10 ENCOUNTER — Other Ambulatory Visit: Payer: Self-pay | Admitting: Cardiology

## 2023-02-15 ENCOUNTER — Ambulatory Visit: Payer: Medicare Other | Attending: Cardiology

## 2023-02-15 DIAGNOSIS — I4819 Other persistent atrial fibrillation: Secondary | ICD-10-CM

## 2023-02-15 DIAGNOSIS — I480 Paroxysmal atrial fibrillation: Secondary | ICD-10-CM | POA: Diagnosis not present

## 2023-02-15 DIAGNOSIS — Z5181 Encounter for therapeutic drug level monitoring: Secondary | ICD-10-CM | POA: Diagnosis not present

## 2023-02-15 DIAGNOSIS — Z7189 Other specified counseling: Secondary | ICD-10-CM

## 2023-02-15 LAB — POCT INR: INR: 2.3 (ref 2.0–3.0)

## 2023-02-15 NOTE — Patient Instructions (Signed)
Description   Continue taking warfarin 1/2 tablet daily except for 1 tablet on Monday.  Recheck INR in 6 weeks. Stay consistent with greens (2-3 servings per week).  Call our office if you have any questions or if placed on any new medications [phone].

## 2023-02-19 ENCOUNTER — Telehealth: Payer: Self-pay | Admitting: *Deleted

## 2023-02-19 NOTE — Telephone Encounter (Signed)
 Pt called and reported she had diarrhea on Friday and last night. She states she thinks it is from her salads she has eaten and may have been bad lettuce. Advised that since we both know diarrhea can cause the INR it increase in some cases then she could have a different leafy veggie such as broccoli, asparagus, or cabbage. Also, advised to take a 1/2 tablet of warfarin today and resume normal dose tomorrow and if the diarrhea resumes she should call back and we will try to work her into the schedule. She verbalized understanding.

## 2023-03-01 ENCOUNTER — Other Ambulatory Visit: Payer: Medicare Other

## 2023-03-01 ENCOUNTER — Inpatient Hospital Stay: Payer: Medicare Other

## 2023-03-01 ENCOUNTER — Ambulatory Visit (HOSPITAL_BASED_OUTPATIENT_CLINIC_OR_DEPARTMENT_OTHER)
Admission: RE | Admit: 2023-03-01 | Discharge: 2023-03-01 | Disposition: A | Discharge status: Home / Self Care | Payer: Medicare Other | Source: Ambulatory Visit | Attending: Oncology | Admitting: Oncology

## 2023-03-01 ENCOUNTER — Inpatient Hospital Stay: Payer: Medicare Other | Attending: Oncology

## 2023-03-01 DIAGNOSIS — D49511 Neoplasm of unspecified behavior of right kidney: Secondary | ICD-10-CM | POA: Insufficient documentation

## 2023-03-01 DIAGNOSIS — G8929 Other chronic pain: Secondary | ICD-10-CM | POA: Insufficient documentation

## 2023-03-01 DIAGNOSIS — I4891 Unspecified atrial fibrillation: Secondary | ICD-10-CM | POA: Diagnosis not present

## 2023-03-01 DIAGNOSIS — R911 Solitary pulmonary nodule: Secondary | ICD-10-CM | POA: Insufficient documentation

## 2023-03-01 DIAGNOSIS — M545 Low back pain, unspecified: Secondary | ICD-10-CM | POA: Insufficient documentation

## 2023-03-01 DIAGNOSIS — Z85028 Personal history of other malignant neoplasm of stomach: Secondary | ICD-10-CM | POA: Insufficient documentation

## 2023-03-01 DIAGNOSIS — I700 Atherosclerosis of aorta: Secondary | ICD-10-CM | POA: Diagnosis not present

## 2023-03-01 DIAGNOSIS — D649 Anemia, unspecified: Secondary | ICD-10-CM | POA: Diagnosis not present

## 2023-03-01 DIAGNOSIS — Z905 Acquired absence of kidney: Secondary | ICD-10-CM | POA: Insufficient documentation

## 2023-03-01 DIAGNOSIS — E039 Hypothyroidism, unspecified: Secondary | ICD-10-CM | POA: Diagnosis not present

## 2023-03-01 DIAGNOSIS — Z85528 Personal history of other malignant neoplasm of kidney: Secondary | ICD-10-CM | POA: Diagnosis present

## 2023-03-01 DIAGNOSIS — E119 Type 2 diabetes mellitus without complications: Secondary | ICD-10-CM | POA: Insufficient documentation

## 2023-03-01 LAB — CBC WITH DIFFERENTIAL (CANCER CENTER ONLY)
Abs Immature Granulocytes: 0.02 10*3/uL (ref 0.00–0.07)
Basophils Absolute: 0 10*3/uL (ref 0.0–0.1)
Basophils Relative: 1 %
Eosinophils Absolute: 0.3 10*3/uL (ref 0.0–0.5)
Eosinophils Relative: 5 %
HCT: 30.6 % — ABNORMAL LOW (ref 36.0–46.0)
Hemoglobin: 10.1 g/dL — ABNORMAL LOW (ref 12.0–15.0)
Immature Granulocytes: 0 %
Lymphocytes Relative: 30 %
Lymphs Abs: 1.8 10*3/uL (ref 0.7–4.0)
MCH: 27.4 pg (ref 26.0–34.0)
MCHC: 33 g/dL (ref 30.0–36.0)
MCV: 83.2 fL (ref 80.0–100.0)
Monocytes Absolute: 0.5 10*3/uL (ref 0.1–1.0)
Monocytes Relative: 9 %
Neutro Abs: 3.4 10*3/uL (ref 1.7–7.7)
Neutrophils Relative %: 55 %
Platelet Count: 267 10*3/uL (ref 150–400)
RBC: 3.68 MIL/uL — ABNORMAL LOW (ref 3.87–5.11)
RDW: 13.8 % (ref 11.5–15.5)
WBC Count: 6 10*3/uL (ref 4.0–10.5)
nRBC: 0 % (ref 0.0–0.2)

## 2023-03-01 LAB — CMP (CANCER CENTER ONLY)
ALT: 8 U/L (ref 0–44)
AST: 11 U/L — ABNORMAL LOW (ref 15–41)
Albumin: 3.7 g/dL (ref 3.5–5.0)
Alkaline Phosphatase: 44 U/L (ref 38–126)
Anion gap: 8 (ref 5–15)
BUN: 26 mg/dL — ABNORMAL HIGH (ref 8–23)
CO2: 25 mmol/L (ref 22–32)
Calcium: 9.1 mg/dL (ref 8.9–10.3)
Chloride: 103 mmol/L (ref 98–111)
Creatinine: 0.89 mg/dL (ref 0.44–1.00)
GFR, Estimated: >60 mL/min (ref >60)
Glucose, Bld: 210 mg/dL — ABNORMAL HIGH (ref 70–99)
Potassium: 3.8 mmol/L (ref 3.5–5.1)
Sodium: 136 mmol/L (ref 135–145)
Total Bilirubin: 0.3 mg/dL (ref 0.0–1.2)
Total Protein: 6.6 g/dL (ref 6.5–8.1)

## 2023-03-01 LAB — TSH: TSH: 3.158 u[IU]/mL (ref 0.350–4.500)

## 2023-03-02 LAB — T4: T4, Total: 9.2 ug/dL (ref 4.5–12.0)

## 2023-03-06 DIAGNOSIS — E039 Hypothyroidism, unspecified: Secondary | ICD-10-CM | POA: Diagnosis not present

## 2023-03-06 DIAGNOSIS — E1165 Type 2 diabetes mellitus with hyperglycemia: Secondary | ICD-10-CM | POA: Diagnosis not present

## 2023-03-08 ENCOUNTER — Inpatient Hospital Stay: Payer: Medicare Other | Admitting: Oncology

## 2023-03-08 VITALS — BP 150/76 | HR 74 | Temp 97.9°F | Resp 18 | Ht 63.0 in | Wt 205.4 lb

## 2023-03-08 DIAGNOSIS — D49511 Neoplasm of unspecified behavior of right kidney: Secondary | ICD-10-CM

## 2023-03-08 DIAGNOSIS — G8929 Other chronic pain: Secondary | ICD-10-CM | POA: Diagnosis not present

## 2023-03-08 DIAGNOSIS — R911 Solitary pulmonary nodule: Secondary | ICD-10-CM | POA: Diagnosis not present

## 2023-03-08 DIAGNOSIS — D649 Anemia, unspecified: Secondary | ICD-10-CM | POA: Diagnosis not present

## 2023-03-08 DIAGNOSIS — Z905 Acquired absence of kidney: Secondary | ICD-10-CM | POA: Diagnosis not present

## 2023-03-08 DIAGNOSIS — Z85028 Personal history of other malignant neoplasm of stomach: Secondary | ICD-10-CM | POA: Diagnosis not present

## 2023-03-08 DIAGNOSIS — E039 Hypothyroidism, unspecified: Secondary | ICD-10-CM | POA: Diagnosis not present

## 2023-03-08 DIAGNOSIS — M545 Low back pain, unspecified: Secondary | ICD-10-CM | POA: Diagnosis not present

## 2023-03-08 DIAGNOSIS — I4891 Unspecified atrial fibrillation: Secondary | ICD-10-CM | POA: Diagnosis not present

## 2023-03-08 DIAGNOSIS — E119 Type 2 diabetes mellitus without complications: Secondary | ICD-10-CM | POA: Diagnosis not present

## 2023-03-08 NOTE — Progress Notes (Signed)
  [facility] OFFICE PROGRESS NOTE   Diagnosis: Renal cell carcinoma  INTERVAL HISTORY:   LatashaHullum returns as scheduled.  She feels well.  Good appetite.  No difficulty with bowel or bladder function.  She has chronic low back pain.  She is followed by Dr. Yetta Barre and is considering a spinal fusion surgery.  A Port-A-Cath remains in place.  Objective:  Vital signs in last 24 hours:  Blood pressure (!) 150/76, pulse 74, temperature 97.9 F (36.6 C), temperature source Temporal, resp. rate 18, height 5\' 3"  (1.6 m), weight 205 lb 6.4 oz (93.2 kg), SpO2 100%.    Lymphatics: No cervical, supraclavicular, axillary, or inguinal nodes Resp: Lungs clear bilaterally Cardio: Regular rate and rhythm GI: No hepatosplenomegaly, no mass, nontender Vascular: No leg edema  Skin: Moisture at the abdominal pannus/groin bilaterally, erythema surrounding the umbilicus  Portacath/PICC-without erythema  Lab Results:  Lab Results  Component Value Date   WBC 6.0 03/01/2023   HGB 10.1 (L) 03/01/2023   HCT 30.6 (L) 03/01/2023   MCV 83.2 03/01/2023   PLT 267 03/01/2023   NEUTROABS 3.4 03/01/2023    CMP  Lab Results  Component Value Date   NA 136 03/01/2023   K 3.8 03/01/2023   CL 103 03/01/2023   CO2 25 03/01/2023   GLUCOSE 210 (H) 03/01/2023   BUN 26 (H) 03/01/2023   CREATININE 0.89 03/01/2023   CALCIUM 9.1 03/01/2023   PROT 6.6 03/01/2023   ALBUMIN 3.7 03/01/2023   AST 11 (L) 03/01/2023   ALT 8 03/01/2023   ALKPHOS 44 03/01/2023   BILITOT 0.3 03/01/2023   GFRNONAA >60 03/01/2023   GFRAA 92 08/20/2017     Medications: I have reviewed the patient's current medications.   Assessment/Plan: Renal cell carcinoma, right nephrectomy 01/29/2020, clear-cell carcinoma right kidney extending into the right renal vein, renal capsule, and renal sinus fat,pT3apN1, 1/1 lymph node, grade 3, separate 0.5 cm renal cell carcinoma Pembrolizumab 03/31/2020 - 08/11/2020, discontinued  due to dermatologic toxicity CTs 08/18/2022-no evidence of recurrent disease, tiny peripheral pulmonary nodules-stable, homogenous macroscopic fat density mass in the right mid abdomen favored to represent a lipoma-unchanged CTs 03/01/2023: No evidence of local recurrence or metastatic disease, stable fat density mass in the right abdomen, stable tiny pulmonary nodules 2.   Hypothyroidism 3.   Chronic mild anemia 4.   Diabetes 5.   Chronic back pain 6.  Atrial fibrillation 7.  Port-A-Cath in place     Disposition: Latasha Hodges is in clinical remission from renal cell carcinoma.  She is now over 3 years out from diagnosis.  She would like to continue every 54-month surveillance imaging and follow-up at the Cancer center.  She would like to keep the Port-A-Cath in place.  She will be scheduled for a Port-A-Cath flush every 8 weeks.  She will return for an office visit and surveillance CTs in 6 months.  Thornton Papas, MD  03/08/2023  3:03 PM

## 2023-03-29 ENCOUNTER — Ambulatory Visit: Payer: Medicare Other | Attending: Cardiology

## 2023-03-29 DIAGNOSIS — I480 Paroxysmal atrial fibrillation: Secondary | ICD-10-CM | POA: Diagnosis not present

## 2023-03-29 DIAGNOSIS — I4819 Other persistent atrial fibrillation: Secondary | ICD-10-CM

## 2023-03-29 DIAGNOSIS — Z7189 Other specified counseling: Secondary | ICD-10-CM

## 2023-03-29 DIAGNOSIS — Z5181 Encounter for therapeutic drug level monitoring: Secondary | ICD-10-CM | POA: Diagnosis not present

## 2023-03-29 LAB — POCT INR: INR: 3.6 — AB (ref 2.0–3.0)

## 2023-03-29 NOTE — Patient Instructions (Signed)
Description   Skip today's dosage of Warfarin, then resume same dosage of warfarin 1/2 tablet daily except for 1 tablet on Mondays.  Recheck INR in 4 weeks.  Stay consistent with greens (2-3 servings per week).  Call our office if you have any questions or if placed on any new medications [phone].

## 2023-03-31 ENCOUNTER — Other Ambulatory Visit: Payer: Self-pay | Admitting: Cardiology

## 2023-04-06 ENCOUNTER — Other Ambulatory Visit: Payer: Self-pay | Admitting: Cardiology

## 2023-04-09 DIAGNOSIS — C641 Malignant neoplasm of right kidney, except renal pelvis: Secondary | ICD-10-CM | POA: Diagnosis not present

## 2023-04-09 DIAGNOSIS — I129 Hypertensive chronic kidney disease with stage 1 through stage 4 chronic kidney disease, or unspecified chronic kidney disease: Secondary | ICD-10-CM | POA: Diagnosis not present

## 2023-04-09 DIAGNOSIS — D631 Anemia in chronic kidney disease: Secondary | ICD-10-CM | POA: Diagnosis not present

## 2023-04-09 DIAGNOSIS — N189 Chronic kidney disease, unspecified: Secondary | ICD-10-CM | POA: Diagnosis not present

## 2023-04-09 DIAGNOSIS — E118 Type 2 diabetes mellitus with unspecified complications: Secondary | ICD-10-CM | POA: Diagnosis not present

## 2023-04-09 DIAGNOSIS — N2581 Secondary hyperparathyroidism of renal origin: Secondary | ICD-10-CM | POA: Diagnosis not present

## 2023-04-09 DIAGNOSIS — N182 Chronic kidney disease, stage 2 (mild): Secondary | ICD-10-CM | POA: Diagnosis not present

## 2023-04-09 DIAGNOSIS — R609 Edema, unspecified: Secondary | ICD-10-CM | POA: Diagnosis not present

## 2023-04-16 ENCOUNTER — Other Ambulatory Visit: Payer: Self-pay | Admitting: Cardiology

## 2023-04-22 ENCOUNTER — Other Ambulatory Visit: Payer: Self-pay | Admitting: Cardiology

## 2023-04-22 DIAGNOSIS — I480 Paroxysmal atrial fibrillation: Secondary | ICD-10-CM

## 2023-04-22 DIAGNOSIS — I10 Essential (primary) hypertension: Secondary | ICD-10-CM

## 2023-04-22 DIAGNOSIS — E7800 Pure hypercholesterolemia, unspecified: Secondary | ICD-10-CM

## 2023-04-22 DIAGNOSIS — I4510 Unspecified right bundle-branch block: Secondary | ICD-10-CM

## 2023-04-24 ENCOUNTER — Ambulatory Visit: Payer: Medicare Other | Attending: Cardiology | Admitting: *Deleted

## 2023-04-24 DIAGNOSIS — I480 Paroxysmal atrial fibrillation: Secondary | ICD-10-CM

## 2023-04-24 DIAGNOSIS — Z7189 Other specified counseling: Secondary | ICD-10-CM | POA: Diagnosis not present

## 2023-04-24 DIAGNOSIS — I4819 Other persistent atrial fibrillation: Secondary | ICD-10-CM | POA: Diagnosis not present

## 2023-04-24 DIAGNOSIS — Z5181 Encounter for therapeutic drug level monitoring: Secondary | ICD-10-CM

## 2023-04-24 LAB — POCT INR: INR: 3 (ref 2.0–3.0)

## 2023-04-24 NOTE — Patient Instructions (Addendum)
 Description   Continue taking warfarin 1/2 tablet daily except for 1 tablet on Mondays.  Recheck INR in 5 weeks.  Stay consistent with greens (2-3 servings per week).  Call our office if you have any questions or if placed on any new medications [phone].

## 2023-04-30 ENCOUNTER — Inpatient Hospital Stay: Payer: Medicare Other | Attending: Oncology

## 2023-04-30 DIAGNOSIS — Z85528 Personal history of other malignant neoplasm of kidney: Secondary | ICD-10-CM | POA: Diagnosis not present

## 2023-04-30 DIAGNOSIS — Z452 Encounter for adjustment and management of vascular access device: Secondary | ICD-10-CM | POA: Diagnosis not present

## 2023-04-30 DIAGNOSIS — Z95828 Presence of other vascular implants and grafts: Secondary | ICD-10-CM

## 2023-04-30 MED ORDER — SODIUM CHLORIDE 0.9% FLUSH
10.0000 mL | INTRAVENOUS | Status: DC | PRN
Start: 2023-04-30 — End: 2023-04-30
  Administered 2023-04-30: 10 mL via INTRAVENOUS

## 2023-04-30 MED ORDER — HEPARIN SOD (PORK) LOCK FLUSH 100 UNIT/ML IV SOLN
500.0000 [IU] | Freq: Once | INTRAVENOUS | Status: AC
  Administered 2023-04-30: 500 [IU] via INTRAVENOUS

## 2023-04-30 NOTE — Patient Instructions (Signed)

## 2023-05-09 ENCOUNTER — Other Ambulatory Visit: Payer: Self-pay | Admitting: Cardiology

## 2023-05-15 ENCOUNTER — Other Ambulatory Visit: Payer: Self-pay | Admitting: Cardiology

## 2023-05-24 ENCOUNTER — Ambulatory Visit (HOSPITAL_COMMUNITY)
Admission: RE | Admit: 2023-05-24 | Discharge: 2023-05-24 | Disposition: A | Discharge status: Home / Self Care | Payer: Medicare Other | Source: Ambulatory Visit | Attending: Internal Medicine | Admitting: Internal Medicine

## 2023-05-24 ENCOUNTER — Ambulatory Visit (HOSPITAL_COMMUNITY): Payer: Medicare Other | Admitting: Internal Medicine

## 2023-05-24 VITALS — BP 136/64 | HR 71 | Ht 63.0 in | Wt 204.0 lb

## 2023-05-24 DIAGNOSIS — I2510 Atherosclerotic heart disease of native coronary artery without angina pectoris: Secondary | ICD-10-CM | POA: Insufficient documentation

## 2023-05-24 DIAGNOSIS — Z9221 Personal history of antineoplastic chemotherapy: Secondary | ICD-10-CM | POA: Insufficient documentation

## 2023-05-24 DIAGNOSIS — I10 Essential (primary) hypertension: Secondary | ICD-10-CM | POA: Diagnosis not present

## 2023-05-24 DIAGNOSIS — E7800 Pure hypercholesterolemia, unspecified: Secondary | ICD-10-CM

## 2023-05-24 DIAGNOSIS — Z905 Acquired absence of kidney: Secondary | ICD-10-CM | POA: Diagnosis not present

## 2023-05-24 DIAGNOSIS — I129 Hypertensive chronic kidney disease with stage 1 through stage 4 chronic kidney disease, or unspecified chronic kidney disease: Secondary | ICD-10-CM | POA: Insufficient documentation

## 2023-05-24 DIAGNOSIS — Z7985 Long-term (current) use of injectable non-insulin antidiabetic drugs: Secondary | ICD-10-CM | POA: Diagnosis not present

## 2023-05-24 DIAGNOSIS — D6869 Other thrombophilia: Secondary | ICD-10-CM

## 2023-05-24 DIAGNOSIS — Z5181 Encounter for therapeutic drug level monitoring: Secondary | ICD-10-CM | POA: Diagnosis not present

## 2023-05-24 DIAGNOSIS — Z7901 Long term (current) use of anticoagulants: Secondary | ICD-10-CM | POA: Diagnosis not present

## 2023-05-24 DIAGNOSIS — N183 Chronic kidney disease, stage 3 unspecified: Secondary | ICD-10-CM | POA: Diagnosis not present

## 2023-05-24 DIAGNOSIS — Z7984 Long term (current) use of oral hypoglycemic drugs: Secondary | ICD-10-CM | POA: Diagnosis not present

## 2023-05-24 DIAGNOSIS — I480 Paroxysmal atrial fibrillation: Secondary | ICD-10-CM | POA: Diagnosis not present

## 2023-05-24 DIAGNOSIS — E1122 Type 2 diabetes mellitus with diabetic chronic kidney disease: Secondary | ICD-10-CM | POA: Insufficient documentation

## 2023-05-24 DIAGNOSIS — I4819 Other persistent atrial fibrillation: Secondary | ICD-10-CM | POA: Diagnosis not present

## 2023-05-24 DIAGNOSIS — Z85528 Personal history of other malignant neoplasm of kidney: Secondary | ICD-10-CM | POA: Diagnosis not present

## 2023-05-24 DIAGNOSIS — Z79899 Other long term (current) drug therapy: Secondary | ICD-10-CM | POA: Diagnosis not present

## 2023-05-24 DIAGNOSIS — I4510 Unspecified right bundle-branch block: Secondary | ICD-10-CM

## 2023-05-24 MED ORDER — FLECAINIDE ACETATE 50 MG PO TABS: ORAL_TABLET | ORAL | 1 refills | Status: DC

## 2023-05-24 NOTE — Addendum Note (Signed)
 Encounter addended by: Learta Codding, CMA on: 05/24/2023 2:18 PM  Actions taken: Visit diagnoses modified, Pharmacy for encounter modified, Order list changed, Diagnosis association updated

## 2023-05-24 NOTE — Progress Notes (Addendum)
 Primary Care Physician: Ollen Bowl, MD Referring Physician: Dr. Jeneen Rinks is a 78 y.o. female with a h/o  hx of persistent atrial fibrillation on chronic anticoagulation, chronic RBBB, DM, mild aortic insufficiency coronary artery calcifications and kidney cancer status post right nephrectomy and chemotherapy with CKD stage II with coronary artery calcium score 47 and HTN. She had a nuclear stress test done 7/23 for flecainide showed no ischemia.  2D echo 6/23 showed normal LVF EF 60-65% with mild AR.   On follow up 05/24/23, she is here for flecainide surveillance. She is currently in NSR. She has had no episodes of Afib since last office visit. She is on flecainide 25 mg BID. No bleeding issues on coumadin. She has an upcoming neurosurgery appointment due to ongoing back issues.   Today, she denies symptoms of palpitations, chest pain, shortness of breath, orthopnea, PND, lower extremity edema, dizziness, presyncope, syncope, or neurologic sequela. The patient is tolerating medications without difficulties and is otherwise without complaint today.   Past Medical History:  Diagnosis Date   Acquired solitary kidney    left side;   s/p right nephrectomy 12/ 2021   Agatston coronary artery calcium score less than 100    Coronary calcium score was 27 which is low at 54th percentile for age and sex matched controls.   Anticoagulated on Coumadin    long-term;  managed by cardiology   Aortic atherosclerosis (HCC)    Arthritis    Chronic kidney disease (CKD), stage III (moderate) (HCC)    Coronary artery calcification    Hyperlipidemia, mixed    Hypertension    Left wrist fracture    Malignant neoplasm of kidney, right (HCC) 01/2020   oncologist--- dr Clelia Croft;  dx 12/ 2021  Stage IIIa,  Clear cell type;  01-29-2020  s/p robotic right radical nephrectomy (one posisitve node);  chemo 03-31-2020 to  08-11-2020   Mild aortic insufficiency    Persistent atrial fibrillation  [facility])    cardiologist--- dr t. turner;  nuclear sress test-- 08-15-2017 low risk normal perfusion, nuclear ef 61%;  event monitor 12-18-2017 in epic   RBBB (right bundle branch block)    chronic   Type 2 diabetes mellitus (HCC)    Past Surgical History:  Procedure Laterality Date   COLONOSCOPY  12/29/2010   Procedure: COLONOSCOPY;  Surgeon: Rob Bunting, MD;  [facility];  Service: Endoscopy;  Laterality: N/A;   HARDWARE REMOVAL Left 05/02/2021   Procedure: left wrist removal of hardware- dorsal spanning plate;  Surgeon: Gomez Cleverly, MD;  Location: [facility] ;  Service: Orthopedics;  Laterality: Left;   IR IMAGING GUIDED PORT INSERTION  07/08/2020   LUMBAR LAMINECTOMY/DECOMPRESSION MICRODISCECTOMY Left 07/14/2020   Procedure: Laminectomy and Foraminotomy - Lumbar three-Lumbar four- Lumbar four-Lumbar five - left with sublaminar decompression;  Surgeon: Tia Alert, MD;  Location: [facility] OR;  Service: Neurosurgery;  Laterality: Left;   ORIF WRIST FRACTURE Left 02/10/2021   Procedure: OPEN REDUCTION INTERNAL FIXATION (ORIF) WRIST FRACTURE;  Surgeon: Gomez Cleverly, MD;  Location: [facility];  Service: Orthopedics;  Laterality: Left;  with MAC   ROBOT ASSISTED LAPAROSCOPIC NEPHRECTOMY N/A 01/29/2020   Procedure: XI ROBOTIC ASSISTED LAPAROSCOPIC RADICAL NEPHRECTOMY;  Surgeon: Heloise Purpura, MD;  Location: [facility];  Service: Urology;  Laterality: N/A;   TUBAL LIGATION  1980    Current Outpatient Medications  Medication Sig Dispense Refill   diphenhydrAMINE (BENADRYL) 25 mg capsule Take 25 mg by mouth as  needed.     fenofibrate micronized (LOFIBRA) 200 MG capsule TAKE 1 CAPSULE BY MOUTH DAILY BEFORE BREAKFAST 30 capsule 0   flecainide (TAMBOCOR) 50 MG tablet TAKE 1/2 TABLET(25 MG) BY MOUTH TWICE DAILY 30 tablet 0   levothyroxine (SYNTHROID) 75 MCG tablet Take 75 mcg by mouth daily before breakfast.     lisinopril (ZESTRIL) 20 MG tablet TAKE 1 TABLET(20 MG) BY MOUTH DAILY.  CALL [phone] TO SCHEDULE FEB APPOINTMENT FOR MORE REFILLS 90 tablet 0   magnesium oxide (MAG-OX) 400 (240 Mg) MG tablet Take 400 mg by mouth daily.     metFORMIN (GLUCOPHAGE) 1000 MG tablet Take 1,000 mg by mouth daily with breakfast.  0   metoprolol succinate (TOPROL-XL) 50 MG 24 hr tablet TAKE 1 TABLET(50 MG) BY MOUTH DAILY 30 tablet 0   OZEMPIC, 0.25 OR 0.5 MG/DOSE, 2 MG/3ML SOPN Inject into the skin once a week. Not sure the dosage     Probiotic Product (PROBIOTIC PO) Take 1 tablet by mouth daily. Magnesium citrate, has several ingredients in it- not sure what all is in it per the patient Pure Dentex     rosuvastatin (CRESTOR) 40 MG tablet Take 1 tablet (40 mg total) by mouth daily. 15 tablet 0   warfarin (COUMADIN) 5 MG tablet TAKE 1 TABLET BY MOUTH DAILY AS DIRECTED BY COUMADIN CLINIC 90 tablet 1   No current facility-administered medications for this encounter.    Allergies  Allergen Reactions   Iodinated Contrast Media Hives, Swelling and Rash   Fish Allergy Other (See Comments)   Iodine Other (See Comments)   Pembrolizumab Other (See Comments)    Severe gum pain, caused thyroid issues   Shellfish Allergy Other (See Comments)   No [employer] Food Allergies Rash    Seafood   Penicillins Rash    Childhood reaction.   Sulfa Antibiotics Hives and Rash   Theophyllines Rash    ROS- All systems are reviewed and negative except as per the HPI above  Physical Exam: Vitals:   05/24/23 1318  BP: 136/64  Pulse: 71  Weight: 92.5 kg  Height: 5\' 3"  (1.6 m)     Wt Readings from Last 3 Encounters:  05/24/23 92.5 kg  03/08/23 93.2 kg  02/01/23 92.4 kg    Labs: Lab Results  Component Value Date   NA 136 03/01/2023   K 3.8 03/01/2023   CL 103 03/01/2023   CO2 25 03/01/2023   GLUCOSE 210 (H) 03/01/2023   BUN 26 (H) 03/01/2023   CREATININE 0.89 03/01/2023   CALCIUM 9.1 03/01/2023   MG 2.1 07/21/2021   Lab Results  Component Value Date   INR 3.0 04/24/2023   Lab  Results  Component Value Date   CHOL 124 09/09/2021   HDL 44 09/09/2021   LDLCALC 41 09/09/2021   TRIG 195 (H) 09/09/2021   GEN- The patient is well appearing, alert and oriented x 3 today.   Neck - no JVD or carotid bruit noted Lungs- Clear to ausculation bilaterally, normal work of breathing Heart- Regular rate and rhythm, no murmurs, rubs or gallops, PMI not laterally displaced Extremities- no clubbing, cyanosis, or edema Skin - no rash or ecchymosis noted   EKG- Vent. rate 71 BPM PR interval 214 ms QRS duration 154 ms QT/QTcB 422/458 ms P-R-T axes 85 8 43 Sinus rhythm with 1st degree A-V block Right bundle branch block Abnormal ECG When compared with ECG of 22-Nov-2022 14:53, PREVIOUS ECG IS PRESENT  ECHO 08/05/21 1. Left  ventricular ejection fraction, by estimation, is 60 to 65%. The  left ventricle has normal function. The left ventricle has no regional  wall motion abnormalities. Left ventricular diastolic parameters are  consistent with Grade I diastolic  dysfunction (impaired relaxation).   2. Right ventricular systolic function is normal. The right ventricular  size is mildly enlarged.   3. Left atrial size was mildly dilated.   4. The mitral valve is normal in structure. Trivial mitral valve  regurgitation. No evidence of mitral stenosis.   5. The aortic valve is tricuspid. There is moderate calcification of the  aortic valve. Aortic valve regurgitation is mild. Aortic valve  sclerosis/calcification is present, without any evidence of aortic  stenosis.   6. The inferior vena cava is normal in size with greater than 50%  respiratory variability, suggesting right atrial pressure of 3 mmHg.    Assessment and Plan:  1. Persistent Afib   She is currently in NSR.   High risk medication monitoring (ICD10: [id number]) Patient requires ongoing monitoring for anti-arrhythmic medication which has the potential to cause life threatening arrhythmias or AV block. ECG  intervals are stable. Continue flecainide 25 mg BID. Continue Toprol 50 mg daily.    2. CHA2DS2VASc score of 5 Continue coumadin as directed.    F/u 6 months Afib clinic.   L[place] Bells, PA-C Afib Clinic [facility] [address] [place], [place] [address] [phone]

## 2023-05-28 ENCOUNTER — Ambulatory Visit: Attending: Cardiology

## 2023-05-28 DIAGNOSIS — Z7189 Other specified counseling: Secondary | ICD-10-CM

## 2023-05-28 DIAGNOSIS — Z5181 Encounter for therapeutic drug level monitoring: Secondary | ICD-10-CM | POA: Diagnosis not present

## 2023-05-28 DIAGNOSIS — I4819 Other persistent atrial fibrillation: Secondary | ICD-10-CM

## 2023-05-28 DIAGNOSIS — I480 Paroxysmal atrial fibrillation: Secondary | ICD-10-CM

## 2023-05-28 LAB — POCT INR: INR: 4 — AB (ref 2.0–3.0)

## 2023-05-28 NOTE — Patient Instructions (Signed)
 Hold today only then Continue taking warfarin 1/2 tablet daily except for 1 tablet on Mondays.  Recheck INR in 3 weeks.  Stay consistent with greens (2-3 servings per week).  Call our office if you have any questions or if placed on any new medications [phone].

## 2023-05-29 ENCOUNTER — Ambulatory Visit

## 2023-06-11 DIAGNOSIS — H25813 Combined forms of age-related cataract, bilateral: Secondary | ICD-10-CM | POA: Diagnosis not present

## 2023-06-11 DIAGNOSIS — H5703 Miosis: Secondary | ICD-10-CM | POA: Diagnosis not present

## 2023-06-11 DIAGNOSIS — E119 Type 2 diabetes mellitus without complications: Secondary | ICD-10-CM | POA: Diagnosis not present

## 2023-06-14 DIAGNOSIS — I700 Atherosclerosis of aorta: Secondary | ICD-10-CM | POA: Diagnosis not present

## 2023-06-14 DIAGNOSIS — Z0000 Encounter for general adult medical examination without abnormal findings: Secondary | ICD-10-CM | POA: Diagnosis not present

## 2023-06-14 DIAGNOSIS — E782 Mixed hyperlipidemia: Secondary | ICD-10-CM | POA: Diagnosis not present

## 2023-06-14 DIAGNOSIS — E039 Hypothyroidism, unspecified: Secondary | ICD-10-CM | POA: Diagnosis not present

## 2023-06-14 DIAGNOSIS — E1165 Type 2 diabetes mellitus with hyperglycemia: Secondary | ICD-10-CM | POA: Diagnosis not present

## 2023-06-14 DIAGNOSIS — I119 Hypertensive heart disease without heart failure: Secondary | ICD-10-CM | POA: Diagnosis not present

## 2023-06-18 ENCOUNTER — Inpatient Hospital Stay: Payer: Medicare Other | Attending: Oncology

## 2023-06-18 DIAGNOSIS — Z452 Encounter for adjustment and management of vascular access device: Secondary | ICD-10-CM | POA: Insufficient documentation

## 2023-06-18 DIAGNOSIS — C641 Malignant neoplasm of right kidney, except renal pelvis: Secondary | ICD-10-CM | POA: Diagnosis not present

## 2023-06-18 DIAGNOSIS — Z95828 Presence of other vascular implants and grafts: Secondary | ICD-10-CM

## 2023-06-18 MED ORDER — HEPARIN SOD (PORK) LOCK FLUSH 100 UNIT/ML IV SOLN
500.0000 [IU] | Freq: Once | INTRAVENOUS | Status: AC
  Administered 2023-06-18: 500 [IU] via INTRAVENOUS

## 2023-06-18 MED ORDER — SODIUM CHLORIDE 0.9% FLUSH
10.0000 mL | Freq: Once | INTRAVENOUS | Status: AC
  Administered 2023-06-18: 10 mL via INTRAVENOUS

## 2023-06-19 ENCOUNTER — Ambulatory Visit: Attending: Cardiology

## 2023-06-19 DIAGNOSIS — Z7189 Other specified counseling: Secondary | ICD-10-CM

## 2023-06-19 DIAGNOSIS — I480 Paroxysmal atrial fibrillation: Secondary | ICD-10-CM | POA: Diagnosis not present

## 2023-06-19 DIAGNOSIS — I4819 Other persistent atrial fibrillation: Secondary | ICD-10-CM

## 2023-06-19 DIAGNOSIS — Z5181 Encounter for therapeutic drug level monitoring: Secondary | ICD-10-CM

## 2023-06-19 LAB — POCT INR: INR: 3.1 — AB (ref 2.0–3.0)

## 2023-06-19 NOTE — Patient Instructions (Signed)
 Continue taking warfarin 1/2 tablet daily except for 1 tablet on Mondays.  Recheck INR in 4 weeks.  Stay consistent with greens (2-3 servings per week).  Call our office if you have any questions or if placed on any new medications [phone].

## 2023-06-21 DIAGNOSIS — M4316 Spondylolisthesis, lumbar region: Secondary | ICD-10-CM | POA: Diagnosis not present

## 2023-06-22 ENCOUNTER — Other Ambulatory Visit: Payer: Self-pay | Admitting: Neurological Surgery

## 2023-06-22 DIAGNOSIS — M4316 Spondylolisthesis, lumbar region: Secondary | ICD-10-CM

## 2023-06-24 ENCOUNTER — Other Ambulatory Visit: Payer: Self-pay | Admitting: Cardiology

## 2023-06-26 DIAGNOSIS — M8588 Other specified disorders of bone density and structure, other site: Secondary | ICD-10-CM | POA: Diagnosis not present

## 2023-06-28 ENCOUNTER — Ambulatory Visit
Admission: RE | Admit: 2023-06-28 | Discharge: 2023-06-28 | Disposition: A | Discharge status: Home / Self Care | Source: Ambulatory Visit | Attending: Neurological Surgery | Admitting: Neurological Surgery

## 2023-06-28 DIAGNOSIS — M4316 Spondylolisthesis, lumbar region: Secondary | ICD-10-CM | POA: Diagnosis not present

## 2023-06-28 DIAGNOSIS — M48061 Spinal stenosis, lumbar region without neurogenic claudication: Secondary | ICD-10-CM | POA: Diagnosis not present

## 2023-06-28 DIAGNOSIS — M4726 Other spondylosis with radiculopathy, lumbar region: Secondary | ICD-10-CM | POA: Diagnosis not present

## 2023-07-04 ENCOUNTER — Other Ambulatory Visit: Payer: Self-pay | Admitting: Cardiology

## 2023-07-04 ENCOUNTER — Telehealth: Payer: Self-pay | Admitting: Cardiology

## 2023-07-04 MED ORDER — METOPROLOL SUCCINATE ER 50 MG PO TB24: 50.0000 mg | ORAL_TABLET | Freq: Every day | ORAL | 2 refills | Status: AC

## 2023-07-04 NOTE — Telephone Encounter (Signed)
*  STAT* If patient is at the pharmacy, call can be transferred to refill team.   1. Which medications need to be refilled? (please list name of each medication and dose if known)   metoprolol  succinate (TOPROL -XL) 50 MG 24 hr tablet     2. Would you like to learn more about the convenience, safety, & potential cost savings by using the [facility]?    3. Are you open to using the [facility] (Type [facility].  ).   4. Which pharmacy/location (including street and city if local pharmacy) is medication to be sent to? [id number] - [place], [place] - [address]    5. Do they need a 30 day or 90 day supply? 90 day   Appt: 07/10/23

## 2023-07-04 NOTE — Telephone Encounter (Signed)
 Refills sent

## 2023-07-04 NOTE — Telephone Encounter (Signed)
This is a A-Fib clinic pt 

## 2023-07-10 ENCOUNTER — Encounter: Payer: Self-pay | Admitting: Cardiology

## 2023-07-10 ENCOUNTER — Ambulatory Visit: Attending: Cardiology | Admitting: Cardiology

## 2023-07-10 VITALS — BP 160/68 | HR 72 | Ht 63.5 in | Wt 196.0 lb

## 2023-07-10 DIAGNOSIS — E7800 Pure hypercholesterolemia, unspecified: Secondary | ICD-10-CM | POA: Diagnosis not present

## 2023-07-10 DIAGNOSIS — I4819 Other persistent atrial fibrillation: Secondary | ICD-10-CM | POA: Diagnosis not present

## 2023-07-10 DIAGNOSIS — I351 Nonrheumatic aortic (valve) insufficiency: Secondary | ICD-10-CM

## 2023-07-10 DIAGNOSIS — I4510 Unspecified right bundle-branch block: Secondary | ICD-10-CM

## 2023-07-10 DIAGNOSIS — I10 Essential (primary) hypertension: Secondary | ICD-10-CM

## 2023-07-10 DIAGNOSIS — R931 Abnormal findings on diagnostic imaging of heart and coronary circulation: Secondary | ICD-10-CM

## 2023-07-10 NOTE — Progress Notes (Signed)
 Date:  07/10/2023   ID:  Latasha Hodges, DOB 1945-03-18, MRN [id number]  PCP:  Elester Grim, MD  Cardiologist:  Gaylyn Keas, MD  Electrophysiologist:  None   Chief Complaint:  PAF, HTN, DM, RBBB  History of Present Illness:    Latasha Hodges is a 78 y.o. female with a hx of persistent atrial fibrillation on chronic anticoagulation, chronic RBBB, DM, mild aortic insufficiency coronary artery calcifications and kidney cancer status post right nephrectomy and chemotherapy with CKD stage II with coronary artery calcium  score 47 and HTN.    She had a nuclear stress test done 7/23 for flecainide  showed no ischemia.  2D echo 6/23 showed normal LVF EF 60-65% with mild AR.  She is here today for followup and is doing well.  She denies any chest pain or pressure, SOB, DOE, PND, orthopnea, LE edema, dizziness, palpitations or syncope. She is compliant with her meds and is tolerating meds with no SE.    Prior CV studies:   The following studies were reviewed today:  Outside labs from [facility]  Past Medical History:  Diagnosis Date   Acquired solitary kidney    left side;   s/p right nephrectomy 12/ 2021   Agatston coronary artery calcium  score less than 100    Coronary calcium  score was 27 which is low at 54th percentile for age and sex matched controls.   Anticoagulated on Coumadin     long-term;  managed by cardiology   Aortic atherosclerosis (HCC)    Arthritis    Chronic kidney disease (CKD), stage III (moderate) (HCC)    Coronary artery calcification    Hyperlipidemia, mixed    Hypertension    Left wrist fracture    Malignant neoplasm of kidney, right (HCC) 01/2020   oncologist--- dr Dirk Fredericks;  dx 12/ 2021  Stage IIIa,  Clear cell type;  01-29-2020  s/p robotic right radical nephrectomy (one posisitve node);  chemo 03-31-2020 to  08-11-2020   Mild aortic insufficiency    Persistent atrial fibrillation [facility])    cardiologist--- dr t. Kirsten Spearing;  nuclear sress test-- 08-15-2017 low  risk normal perfusion, nuclear ef 61%;  event monitor 12-18-2017 in epic   RBBB (right bundle branch block)    chronic   Type 2 diabetes mellitus (HCC)    Past Surgical History:  Procedure Laterality Date   COLONOSCOPY  12/29/2010   Procedure: COLONOSCOPY;  Surgeon: Hoyt Macleod, MD;  Location: [facility];  Service: Endoscopy;  Laterality: N/A;   HARDWARE REMOVAL Left 05/02/2021   Procedure: left wrist removal of hardware- dorsal spanning plate;  Surgeon: Ltanya Rummer, MD;  Location: [facility];  Service: Orthopedics;  Laterality: Left;   IR IMAGING GUIDED PORT INSERTION  07/08/2020   LUMBAR LAMINECTOMY/DECOMPRESSION MICRODISCECTOMY Left 07/14/2020   Procedure: Laminectomy and Foraminotomy - Lumbar three-Lumbar four- Lumbar four-Lumbar five - left with sublaminar decompression;  Surgeon: Isadora Mar, MD;  Location: [facility] OR;  Service: Neurosurgery;  Laterality: Left;   ORIF WRIST FRACTURE Left 02/10/2021   Procedure: OPEN REDUCTION INTERNAL FIXATION (ORIF) WRIST FRACTURE;  Surgeon: Ltanya Rummer, MD;  Location: [facility];  Service: Orthopedics;  Laterality: Left;  with MAC   ROBOT ASSISTED LAPAROSCOPIC NEPHRECTOMY N/A 01/29/2020   Procedure: XI ROBOTIC ASSISTED LAPAROSCOPIC RADICAL NEPHRECTOMY;  Surgeon: Florencio Hunting, MD;  Location: [facility];  Service: Urology;  Laterality: N/A;   TUBAL LIGATION  1980     Current Meds  Medication Sig   diphenhydrAMINE  (BENADRYL ) 25 mg capsule Take 25 mg  by mouth as needed.   fenofibrate  micronized (LOFIBRA) 200 MG capsule TAKE 1 CAPSULE BY MOUTH DAILY BEFORE BREAKFAST   flecainide  (TAMBOCOR ) 50 MG tablet Take 1/2 tablet by mouth twice daily   levothyroxine  (SYNTHROID ) 75 MCG tablet Take 75 mcg by mouth daily before breakfast.   lisinopril  (ZESTRIL ) 20 MG tablet TAKE 1 TABLET(20 MG) BY MOUTH DAILY. CALL [phone] TO SCHEDULE FEB APPOINTMENT FOR MORE REFILLS   magnesium  oxide (MAG-OX) 400 (240 Mg) MG tablet Take 400 mg by mouth daily.    metFORMIN  (GLUCOPHAGE ) 1000 MG tablet Take 1,000 mg by mouth daily with breakfast.   metoprolol  succinate (TOPROL -XL) 50 MG 24 hr tablet Take 1 tablet (50 mg total) by mouth daily. Take with or immediately following a meal.   OZEMPIC, 2 MG/DOSE, 8 MG/3ML SOPN Inject 2 mg into the skin once a week.   Probiotic Product (PROBIOTIC PO) Take 1 tablet by mouth daily. Magnesium  citrate, has several ingredients in it- not sure what all is in it per the patient Pure Dentex   rosuvastatin  (CRESTOR ) 40 MG tablet TAKE 1 TABLET(40 MG) BY MOUTH DAILY   warfarin (COUMADIN ) 5 MG tablet TAKE 1 TABLET BY MOUTH DAILY AS DIRECTED BY COUMADIN  CLINIC     Allergies:   Iodinated contrast media, Fish allergy, Iodine, Pembrolizumab , Shellfish allergy, No healthtouch food allergies, Penicillins, Sulfa antibiotics, and Theophyllines   Social History   Tobacco Use   Smoking status: Former    Current packs/day: 0.00    Types: Cigarettes    Quit date: 12/28/1981    Years since quitting: 41.5   Smokeless tobacco: Never  Vaping Use   Vaping status: Never Used  Substance Use Topics   Alcohol use: Yes    Comment: occas.   Drug use: No     Family Hx: The patient's family history includes Cancer in her mother; Colonic polyp in her cousin; Diabetes in her father; Hypertension in her father. There is no history of Colon cancer, Allergic rhinitis, Angioedema, Asthma, Eczema, or Urticaria.  ROS:   Please see the history of present illness.     All other systems reviewed and are negative.   Labs/Other Tests and Data Reviewed:    Recent Labs: 03/01/2023: ALT 8; BUN 26; Creatinine 0.89; Hemoglobin 10.1; Platelet Count 267; Potassium 3.8; Sodium 136; TSH 3.158   Recent Lipid Panel Lab Results  Component Value Date/Time   CHOL 124 09/09/2021 01:51 PM   CHOL 152 05/12/2021 12:00 AM   TRIG 195 (H) 09/09/2021 01:51 PM   HDL 44 09/09/2021 01:51 PM   HDL 48 05/12/2021 12:00 AM   CHOLHDL 2.8 09/09/2021 01:51 PM    LDLCALC 41 09/09/2021 01:51 PM   LDLCALC 69 05/12/2021 12:00 AM   LDLDIRECT 69.0 08/12/2014 08:05 AM    Wt Readings from Last 3 Encounters:  07/10/23 196 lb (88.9 kg)  05/24/23 204 lb (92.5 kg)  03/08/23 205 lb 6.4 oz (93.2 kg)     Objective:    Vital Signs:  BP (!) 160/68   Pulse 72   Ht 5' 3.5" (1.613 m)   Wt 196 lb (88.9 kg)   SpO2 98%   BMI 34.18 kg/m   GEN: Well nourished, well developed in no acute distress HEENT: Normal NECK: No JVD; No carotid bruits LYMPHATICS: No lymphadenopathy CARDIAC:RRR, no murmurs, rubs, gallops RESPIRATORY:  Clear to auscultation without rales, wheezing or rhonchi  ABDOMEN: Soft, non-tender, non-distended MUSCULOSKELETAL:  No edema; No deformity  SKIN: Warm and dry NEUROLOGIC:  Alert and oriented x 3 PSYCHIATRIC:  Normal affect   ASSESSMENT & PLAN:    1.  Persistent atrial fibrillation -She remains in normal sinus rhythm -No bleeding problems on warfarin -Denies any palpitations -Continue warfarin per pharmacy protocol and Toprol -XL 50 mg daily as well as flecainide  25 mg twice daily with as needed refills -I have personally reviewed and interpreted outside labs performed by patient's PCP which showed serum creatinine 0.89 on 03/01/2023, potassium 4.2 on 06/14/2023 -QRS and PR interval stable compared to echo 11/22/2022  2.  HTN -BP elevated on exam today>>she has been on a lot of stress and had onion soup for lunch -BP was 110/40mmHg when seen by PCP -Continue lisinopril  20 mg daily, Toprol -XL 50 mg daily with as needed refills  3.  Chronic RBBB  -no ischemia on nuclear stress test 2019 -She denies any anginal symptoms  4.  HLD -LDL goal < 70 in setting of DM an coronary artery calcium  -I have personally reviewed and interpreted outside labs performed by patient's PCP which showed LDL 48, HDL 30, ALT 9 on 06/14/2023 -Continue prescription drug management with resume Crestor40 mg daily and fenofibrate  200 mg daily  5.  Coronary artery  calcifications -Coronary calcium  score on 05/02/2021 was 47 which was 54th percentile for age for age and sex matched controls. -Denies any anginal symptoms -No aspirin due to Coumadin  -Continue Crestor  40 mg daily and Toprol -XL 50 mg daily with as needed refills  6.  Aortic insufficiency -Mild by 2D echo 07/2021 -Repeat echo in 1 year  Medication Adjustments/Labs and Tests Ordered: Current medicines are reviewed at length with the patient today.  Concerns regarding medicines are outlined above.  Tests Ordered: No orders of the defined types were placed in this encounter.  Medication Changes: No orders of the defined types were placed in this encounter.   Disposition:  Follow up in 1 year(s)  Signed, Gaylyn Keas, MD  07/10/2023 3:17 PM    [facility]

## 2023-07-10 NOTE — Patient Instructions (Signed)

## 2023-07-16 ENCOUNTER — Other Ambulatory Visit: Payer: Self-pay

## 2023-07-16 MED ORDER — FENOFIBRATE 200 MG PO CAPS: ORAL_CAPSULE | ORAL | 3 refills | Status: AC

## 2023-07-17 ENCOUNTER — Ambulatory Visit: Attending: Cardiology | Admitting: *Deleted

## 2023-07-17 DIAGNOSIS — I480 Paroxysmal atrial fibrillation: Secondary | ICD-10-CM

## 2023-07-17 DIAGNOSIS — Z5181 Encounter for therapeutic drug level monitoring: Secondary | ICD-10-CM | POA: Diagnosis not present

## 2023-07-17 DIAGNOSIS — I4819 Other persistent atrial fibrillation: Secondary | ICD-10-CM

## 2023-07-17 DIAGNOSIS — Z7189 Other specified counseling: Secondary | ICD-10-CM

## 2023-07-17 LAB — POCT INR: INR: 3.7 — AB (ref 2.0–3.0)

## 2023-07-17 NOTE — Patient Instructions (Signed)
 Description   Do not take any warfarin today then continue taking warfarin 1/2 tablet daily except for 1 tablet on Mondays. Recheck INR in 3 weeks.  Stay consistent with greens (2-3 servings per week).  Call our office if you have any questions or if placed on any new medications [phone].

## 2023-07-23 ENCOUNTER — Telehealth: Payer: Self-pay | Admitting: Cardiology

## 2023-07-23 ENCOUNTER — Telehealth: Payer: Self-pay

## 2023-07-23 DIAGNOSIS — M48062 Spinal stenosis, lumbar region with neurogenic claudication: Secondary | ICD-10-CM | POA: Diagnosis not present

## 2023-07-23 NOTE — Telephone Encounter (Signed)
   Pre-operative Risk Assessment    Patient Name: Latasha Hodges  DOB: Dec 15, 1945 MRN: [id number]   Date of last office visit: 07/10/23 with Dr. Micael Adas Date of next office visit: None   Request for Surgical Clearance    Procedure:  L2-3 Laminectomy/ Sublaminar Decompression  Date of Surgery:  Clearance TBD                                Surgeon:  Dr. Waymond Hailey Surgeon's Group or Practice Name:  [facility] Phone number:  [phone] Fax number:  [phone] Attn: Sherian Dimitri   Type of Clearance Requested:   - Medical  - Pharmacy:  Hold Warfarin (Coumadin ) not indicated   Type of Anesthesia:  General    Additional requests/questions:    Kenny Peals   07/23/2023, 4:47 PM

## 2023-07-23 NOTE — Telephone Encounter (Signed)
 Patient called to report her surgeon Dr. Waymond Hailey at [facility] will be sending clearance request.

## 2023-07-24 NOTE — Telephone Encounter (Signed)
 See clearance notes in chart.

## 2023-07-26 NOTE — Telephone Encounter (Signed)
 Patient with diagnosis of afib on warfarin for anticoagulation.    Procedure:  L2-3 Laminectomy/ Sublaminar Decompression  Date of procedure: TBD   CHA2DS2-VASc Score = 6   This indicates a 9.7% annual risk of stroke. The patient's score is based upon: CHF History: 0 HTN History: 1 Diabetes History: 1 Stroke History: 0 Vascular Disease History: 1 Age Score: 2 Gender Score: 1      Patient has not had an Afib/aflutter ablation within the last 3 months or DCCV within the last 30 days  Per office protocol, patient can hold warfarin for 5 days prior to procedure.    Patient will not need bridging with Lovenox  (enoxaparin ) around procedure.  **This guidance is not considered finalized until pre-operative APP has relayed final recommendations.**

## 2023-07-26 NOTE — Telephone Encounter (Signed)
   Primary Cardiologist: Gaylyn Keas, MD  Chart reviewed as part of pre-operative protocol coverage. Given past medical history and time since last visit, based on ACC/AHA guidelines, Latasha Hodges would be at acceptable risk for the planned procedure without further cardiovascular testing.   Patient was advised that if she develops new symptoms prior to surgery to contact our office to arrange a follow-up appointment.  She verbalized understanding.  Per office protocol, patient can hold warfarin for 5 days prior to procedure. Patient will not need bridging with Lovenox  (enoxaparin ) around procedure.  I will route this recommendation to the requesting party via Epic fax function and remove from pre-op pool.  Please call with questions.  Gerldine Koch, NP-C  07/26/2023, 5:08 PM [address] [place], [place] [address] Office [phone] Fax [phone]

## 2023-07-28 ENCOUNTER — Other Ambulatory Visit: Payer: Self-pay | Admitting: Cardiology

## 2023-08-02 ENCOUNTER — Other Ambulatory Visit: Payer: Self-pay | Admitting: Neurological Surgery

## 2023-08-07 ENCOUNTER — Ambulatory Visit: Attending: Cardiology

## 2023-08-07 ENCOUNTER — Ambulatory Visit

## 2023-08-07 DIAGNOSIS — Z7189 Other specified counseling: Secondary | ICD-10-CM

## 2023-08-07 DIAGNOSIS — I480 Paroxysmal atrial fibrillation: Secondary | ICD-10-CM | POA: Diagnosis not present

## 2023-08-07 DIAGNOSIS — Z5181 Encounter for therapeutic drug level monitoring: Secondary | ICD-10-CM

## 2023-08-07 DIAGNOSIS — I4819 Other persistent atrial fibrillation: Secondary | ICD-10-CM

## 2023-08-07 LAB — POCT INR: INR: 2.4 (ref 2.0–3.0)

## 2023-08-07 NOTE — Progress Notes (Signed)
Please see anticoagulation encounter.

## 2023-08-07 NOTE — Patient Instructions (Signed)
 continue taking warfarin 1/2 tablet daily except for 1 tablet on Mondays. Recheck INR in 5 weeks.  Stay consistent with greens (2-3 servings per week).  Call our office if you have any questions or if placed on any new medications [phone].

## 2023-08-13 ENCOUNTER — Inpatient Hospital Stay: Payer: Medicare Other | Attending: Oncology

## 2023-08-13 VITALS — BP 119/55 | HR 71 | Temp 98.3°F | Resp 16

## 2023-08-13 DIAGNOSIS — Z452 Encounter for adjustment and management of vascular access device: Secondary | ICD-10-CM | POA: Diagnosis not present

## 2023-08-13 DIAGNOSIS — C641 Malignant neoplasm of right kidney, except renal pelvis: Secondary | ICD-10-CM | POA: Insufficient documentation

## 2023-08-13 DIAGNOSIS — Z95828 Presence of other vascular implants and grafts: Secondary | ICD-10-CM

## 2023-08-13 DIAGNOSIS — D49511 Neoplasm of unspecified behavior of right kidney: Secondary | ICD-10-CM

## 2023-08-13 MED ORDER — HEPARIN SOD (PORK) LOCK FLUSH 100 UNIT/ML IV SOLN
500.0000 [IU] | Freq: Once | INTRAVENOUS | Status: AC
  Administered 2023-08-13: 500 [IU] via INTRAVENOUS

## 2023-08-13 MED ORDER — SODIUM CHLORIDE 0.9% FLUSH
10.0000 mL | INTRAVENOUS | Status: DC | PRN
  Administered 2023-08-13: 10 mL via INTRAVENOUS

## 2023-09-06 ENCOUNTER — Ambulatory Visit (HOSPITAL_BASED_OUTPATIENT_CLINIC_OR_DEPARTMENT_OTHER)
Admission: RE | Admit: 2023-09-06 | Discharge: 2023-09-06 | Disposition: A | Discharge status: Home / Self Care | Source: Ambulatory Visit | Attending: Oncology | Admitting: Oncology

## 2023-09-06 DIAGNOSIS — Z905 Acquired absence of kidney: Secondary | ICD-10-CM | POA: Diagnosis not present

## 2023-09-06 DIAGNOSIS — D49511 Neoplasm of unspecified behavior of right kidney: Secondary | ICD-10-CM | POA: Diagnosis not present

## 2023-09-06 DIAGNOSIS — I700 Atherosclerosis of aorta: Secondary | ICD-10-CM | POA: Diagnosis not present

## 2023-09-14 ENCOUNTER — Ambulatory Visit: Attending: Cardiology | Admitting: *Deleted

## 2023-09-14 DIAGNOSIS — Z7189 Other specified counseling: Secondary | ICD-10-CM

## 2023-09-14 DIAGNOSIS — Z5181 Encounter for therapeutic drug level monitoring: Secondary | ICD-10-CM

## 2023-09-14 DIAGNOSIS — I4819 Other persistent atrial fibrillation: Secondary | ICD-10-CM | POA: Diagnosis not present

## 2023-09-14 DIAGNOSIS — I480 Paroxysmal atrial fibrillation: Secondary | ICD-10-CM | POA: Diagnosis not present

## 2023-09-14 LAB — POCT INR: INR: 2.8 (ref 2.0–3.0)

## 2023-09-14 NOTE — Progress Notes (Signed)
 INR-2.8 Please see anticoagulation encounter

## 2023-09-14 NOTE — Patient Instructions (Addendum)
 Description   Continue taking warfarin 1/2 tablet daily except for 1 tablet on Mondays. Recheck INR in 1 week post procedure. Stay consistent with greens (2-3 servings per week).  Call our office if you have any questions or if placed on any new medications [phone].    Last dose of warfarin on 09/22/23 then resume warfarin at regular doseage per surgeon and call us  at [phone] with any updates.

## 2023-09-17 DIAGNOSIS — E1165 Type 2 diabetes mellitus with hyperglycemia: Secondary | ICD-10-CM | POA: Diagnosis not present

## 2023-09-19 ENCOUNTER — Telehealth: Payer: Self-pay | Admitting: Oncology

## 2023-09-19 NOTE — Telephone Encounter (Signed)
 PT called to reschedule her appt. Will be having back surgery and needs to come in to get port flushed before the 25th appt.

## 2023-09-24 NOTE — Pre-Procedure Instructions (Signed)
 Surgical Instructions   Your procedure is scheduled on September 28, 2023. Report to [facility] Main Entrance A at 5:30 A.M., then check in with the Admitting office. Any questions or running late day of surgery: call [phone]  Questions prior to your surgery date: call [phone], Monday-Friday, 8am-4pm. If you experience any cold or flu symptoms such as cough, fever, chills, shortness of breath, etc. between now and your scheduled surgery, please notify us  at the above number.     Remember:  Do not eat or drink after midnight the night before your surgery   Take these medicines the morning of surgery with A SIP OF WATER : fenofibrate  micronized (LOFIBRA)  flecainide  (TAMBOCOR )  levothyroxine  (SYNTHROID )  metoprolol  succinate (TOPROL -XL)  rosuvastatin  (CRESTOR )    STOP taking your warfarin (COUMADIN ) five days prior to surgery. Your last dose will be August 9th.   One week prior to surgery, STOP taking any Aspirin (unless otherwise instructed by your surgeon) Aleve, Naproxen, Ibuprofen, Motrin, Advil, Goody's, BC's, all herbal medications, fish oil, and non-prescription vitamins.   WHAT DO I DO ABOUT MY DIABETES MEDICATION?   Do not take metFORMIN  (GLUCOPHAGE ) the morning of surgery.  STOP taking your OZEMPIC one week prior to surgery. DO NOT take any doses after August 7th.   HOW TO MANAGE YOUR DIABETES BEFORE AND AFTER SURGERY  Why is it important to control my blood sugar before and after surgery? Improving blood sugar levels before and after surgery helps healing and can limit problems. A way of improving blood sugar control is eating a healthy diet by:  Eating less sugar and carbohydrates  Increasing activity/exercise  Talking with your doctor about reaching your blood sugar goals High blood sugars (greater than 180 mg/dL) can raise your risk of infections and slow your recovery, so you will need to focus on controlling your diabetes during the weeks before  surgery. Make sure that the doctor who takes care of your diabetes knows about your planned surgery including the date and location.  How do I manage my blood sugar before surgery? Check your blood sugar at least 4 times a day, starting 2 days before surgery, to make sure that the level is not too high or low.  Check your blood sugar the morning of your surgery when you wake up and every 2 hours until you get to the Short Stay unit.  If your blood sugar is less than 70 mg/dL, you will need to treat for low blood sugar: Do not take insulin . Treat a low blood sugar (less than 70 mg/dL) with  cup of clear juice (cranberry or apple), 4 glucose tablets, OR glucose gel. Recheck blood sugar in 15 minutes after treatment (to make sure it is greater than 70 mg/dL). If your blood sugar is not greater than 70 mg/dL on recheck, call [phone] for further instructions. Report your blood sugar to the short stay nurse when you get to Short Stay.  If you are admitted to the hospital after surgery: Your blood sugar will be checked by the staff and you will probably be given insulin  after surgery (instead of oral diabetes medicines) to make sure you have good blood sugar levels. The goal for blood sugar control after surgery is 80-180 mg/dL.                      Do NOT Smoke (Tobacco/Vaping) for 24 hours prior to your procedure.  If you use a CPAP at night, you may bring  your mask/headgear for your overnight stay.   You will be asked to remove any contacts, glasses, piercing's, hearing aid's, dentures/partials prior to surgery. Please bring cases for these items if needed.    Patients discharged the day of surgery will not be allowed to drive home, and someone needs to stay with them for 24 hours.  SURGICAL WAITING ROOM VISITATION Patients may have no more than 2 support people in the waiting area - these visitors may rotate.   Pre-op nurse will coordinate an appropriate time for 1 ADULT support  person, who may not rotate, to accompany patient in pre-op.  Children under the age of 38 must have an adult with them who is not the patient and must remain in the main waiting area with an adult.  If the patient needs to stay at the hospital during part of their recovery, the visitor guidelines for inpatient rooms apply.  Please refer to the [facility] website for the visitor guidelines for any additional information.   If you received a COVID test during your pre-op visit  it is requested that you wear a mask when out in public, stay away from anyone that may not be feeling well and notify your surgeon if you develop symptoms. If you have been in contact with anyone that has tested positive in the last 10 days please notify you surgeon.      Pre-operative 5 CHG Bathing Instructions   You can play a key role in reducing the risk of infection after surgery. Your skin needs to be as free of germs as possible. You can reduce the number of germs on your skin by washing with CHG (chlorhexidine  gluconate) soap before surgery. CHG is an antiseptic soap that kills germs and continues to kill germs even after washing.   DO NOT use if you have an allergy to chlorhexidine /CHG or antibacterial soaps. If your skin becomes reddened or irritated, stop using the CHG and notify one of our RNs at [phone].   Please shower with the CHG soap starting 4 days before surgery using the following schedule:     Please keep in mind the following:  DO NOT shave, including legs and underarms, starting the day of your first shower.   You may shave your face at any point before/day of surgery.  Place clean sheets on your bed the day you start using CHG soap. Use a clean washcloth (not used since being washed) for each shower. DO NOT sleep with pets once you start using the CHG.   CHG Shower Instructions:  Wash your face and private area with normal soap. If you choose to wash your hair, wash first with your  normal shampoo.  After you use shampoo/soap, rinse your hair and body thoroughly to remove shampoo/soap residue.  Turn the water  OFF and apply about 3 tablespoons (45 ml) of CHG soap to a CLEAN washcloth.  Apply CHG soap ONLY FROM YOUR NECK DOWN TO YOUR TOES (washing for 3-5 minutes)  DO NOT use CHG soap on face, private areas, open wounds, or sores.  Pay special attention to the area where your surgery is being performed.  If you are having back surgery, having someone wash your back for you may be helpful. Wait 2 minutes after CHG soap is applied, then you may rinse off the CHG soap.  Pat dry with a clean towel  Put on clean clothes/pajamas   If you choose to wear lotion, please use ONLY the CHG-compatible lotions that  are listed below.  Additional instructions for the day of surgery: DO NOT APPLY any lotions, deodorants, cologne, or perfumes.   Do not bring valuables to the hospital. [facility] is not responsible for any belongings/valuables. Do not wear nail polish, gel polish, artificial nails, or any other type of covering on natural nails (fingers and toes) Do not wear jewelry or makeup Put on clean/comfortable clothes.  Please brush your teeth.  Ask your nurse before applying any prescription medications to the skin.     CHG Compatible Lotions   Aveeno Moisturizing lotion  Cetaphil Moisturizing Cream  Cetaphil Moisturizing Lotion  Clairol Herbal Essence Moisturizing Lotion, Dry Skin  Clairol Herbal Essence Moisturizing Lotion, Extra Dry Skin  Clairol Herbal Essence Moisturizing Lotion, Normal Skin  Curel Age Defying Therapeutic Moisturizing Lotion with Alpha Hydroxy  Curel Extreme Care Body Lotion  Curel Soothing Hands Moisturizing Hand Lotion  Curel Therapeutic Moisturizing Cream, Fragrance-Free  Curel Therapeutic Moisturizing Lotion, Fragrance-Free  Curel Therapeutic Moisturizing Lotion, Original Formula  Eucerin Daily Replenishing Lotion  Eucerin Dry Skin Therapy  Plus Alpha Hydroxy Crme  Eucerin Dry Skin Therapy Plus Alpha Hydroxy Lotion  Eucerin Original Crme  Eucerin Original Lotion  Eucerin Plus Crme Eucerin Plus Lotion  Eucerin TriLipid Replenishing Lotion  Keri Anti-Bacterial Hand Lotion  Keri Deep Conditioning Original Lotion Dry Skin Formula Softly Scented  Keri Deep Conditioning Original Lotion, Fragrance Free Sensitive Skin Formula  Keri Lotion Fast Absorbing Fragrance Free Sensitive Skin Formula  Keri Lotion Fast Absorbing Softly Scented Dry Skin Formula  Keri Original Lotion  Keri Skin Renewal Lotion Keri Silky Smooth Lotion  Keri Silky Smooth Sensitive Skin Lotion  Nivea Body Creamy Conditioning Oil  Nivea Body Extra Enriched Lotion  Nivea Body Original Lotion  Nivea Body Sheer Moisturizing Lotion Nivea Crme  Nivea Skin Firming Lotion  NutraDerm 30 Skin Lotion  NutraDerm Skin Lotion  NutraDerm Therapeutic Skin Cream  NutraDerm Therapeutic Skin Lotion  ProShield Protective Hand Cream  Provon moisturizing lotion  Please read over the following fact sheets that you were given.

## 2023-09-25 ENCOUNTER — Encounter (HOSPITAL_COMMUNITY): Payer: Self-pay

## 2023-09-25 ENCOUNTER — Telehealth: Payer: Self-pay | Admitting: Oncology

## 2023-09-25 ENCOUNTER — Encounter (HOSPITAL_COMMUNITY)
Admission: RE | Admit: 2023-09-25 | Discharge: 2023-09-25 | Disposition: A | Discharge status: Home / Self Care | Source: Ambulatory Visit | Attending: Neurological Surgery | Admitting: Neurological Surgery

## 2023-09-25 ENCOUNTER — Other Ambulatory Visit: Payer: Self-pay

## 2023-09-25 ENCOUNTER — Encounter: Payer: Self-pay | Admitting: *Deleted

## 2023-09-25 VITALS — BP 148/59 | HR 73 | Temp 98.0°F | Resp 17 | Ht 63.5 in | Wt 196.6 lb

## 2023-09-25 DIAGNOSIS — I4510 Unspecified right bundle-branch block: Secondary | ICD-10-CM | POA: Diagnosis not present

## 2023-09-25 DIAGNOSIS — Z01812 Encounter for preprocedural laboratory examination: Secondary | ICD-10-CM | POA: Insufficient documentation

## 2023-09-25 DIAGNOSIS — I2510 Atherosclerotic heart disease of native coronary artery without angina pectoris: Secondary | ICD-10-CM | POA: Diagnosis not present

## 2023-09-25 DIAGNOSIS — I4891 Unspecified atrial fibrillation: Secondary | ICD-10-CM | POA: Insufficient documentation

## 2023-09-25 DIAGNOSIS — E119 Type 2 diabetes mellitus without complications: Secondary | ICD-10-CM | POA: Insufficient documentation

## 2023-09-25 DIAGNOSIS — Z7901 Long term (current) use of anticoagulants: Secondary | ICD-10-CM | POA: Diagnosis not present

## 2023-09-25 DIAGNOSIS — I10 Essential (primary) hypertension: Secondary | ICD-10-CM | POA: Insufficient documentation

## 2023-09-25 DIAGNOSIS — Z01818 Encounter for other preprocedural examination: Secondary | ICD-10-CM

## 2023-09-25 HISTORY — DX: Hypothyroidism, unspecified: E03.9

## 2023-09-25 HISTORY — DX: Chronic obstructive pulmonary disease, unspecified: J44.9

## 2023-09-25 LAB — BASIC METABOLIC PANEL WITH GFR
Anion gap: 10 (ref 5–15)
BUN: 25 mg/dL — ABNORMAL HIGH (ref 8–23)
CO2: 20 mmol/L — ABNORMAL LOW (ref 22–32)
Calcium: 9.7 mg/dL (ref 8.9–10.3)
Chloride: 105 mmol/L (ref 98–111)
Creatinine, Ser: 0.96 mg/dL (ref 0.44–1.00)
GFR, Estimated: >60 mL/min (ref >60)
Glucose, Bld: 213 mg/dL — ABNORMAL HIGH (ref 70–99)
Potassium: 3.9 mmol/L (ref 3.5–5.1)
Sodium: 135 mmol/L (ref 135–145)

## 2023-09-25 LAB — CBC
HCT: 33.8 % — ABNORMAL LOW (ref 36.0–46.0)
Hemoglobin: 10.6 g/dL — ABNORMAL LOW (ref 12.0–15.0)
MCH: 27 pg (ref 26.0–34.0)
MCHC: 31.4 g/dL (ref 30.0–36.0)
MCV: 86 fL (ref 80.0–100.0)
Platelets: 304 K/uL (ref 150–400)
RBC: 3.93 MIL/uL (ref 3.87–5.11)
RDW: 13.9 % (ref 11.5–15.5)
WBC: 6.7 K/uL (ref 4.0–10.5)
nRBC: 0 % (ref 0.0–0.2)

## 2023-09-25 LAB — GLUCOSE, CAPILLARY: Glucose-Capillary: 211 mg/dL — ABNORMAL HIGH (ref 70–99)

## 2023-09-25 LAB — SURGICAL PCR SCREEN
MRSA, PCR: NEGATIVE
Staphylococcus aureus: NEGATIVE

## 2023-09-25 NOTE — Progress Notes (Signed)
 PCP - Dr. Velna Skeeter Cardiologist - Dr. Wilbert Bihari - last office visit 07/10/2023  PPM/ICD - Denies Device Orders - n/a Rep Notified - n/a  Chest x-ray - n/a EKG - 05/24/2023 Stress Test - 08/30/2021 ECHO - 08/05/2021 Cardiac Cath - Denies  Sleep Study - Denies CPAP - n/a  Pt is DM2. She checks her blood sugar 1x/month. She does not know her normal fasting glucose. CBG at pre-op 211. For lunch, pt had waffle with sugar free syrup and coffee with sugar free creamer. Last A1c result 8.1 on 09/20/2023  Last dose of GLP1 agonist- Last dose of Ozempic 09/15/23 GLP1 instructions: Pt instructed to continue holding medication until after surgery  Blood Thinner Instructions: Pt instructed to hold Coumadin  five days prior to surgery. Last dose was August 9th. Aspirin Instructions: n/a  NPO after midnight  COVID TEST- n/a   Anesthesia review: Yes. Cardiac clearance (HTN, A.Fib, CAD), DM2 and unilateral kidney from renal cancer.  Patient denies shortness of breath, fever, cough and chest pain at PAT appointment. Pt denies any respiratory illness/infection in the last two months.   All instructions explained to the patient, with a verbal understanding of the material. Patient agrees to go over the instructions while at home for a better understanding. Patient also instructed to self quarantine after being tested for COVID-19. The opportunity to ask questions was provided.

## 2023-09-25 NOTE — Telephone Encounter (Signed)
 Pt called concerned about her port flush before her surgery. Let her know that she is good to have it flushed in Spetember due to last flush on 6/30. Port will not be accessed during surgery.

## 2023-09-26 LAB — HEMOGLOBIN A1C
Hgb A1c MFr Bld: 8.1 % — ABNORMAL HIGH (ref 4.8–5.6)
Mean Plasma Glucose: 185.77 mg/dL

## 2023-09-26 NOTE — Progress Notes (Signed)
 Anesthesia Chart Review:  78 year old female with pertinent history of atrial fibrillation on Coumadin  and flecainide , right bundle branch block, HTN, mild aortic insufficiency, coronary artery calcifications.  Nuclear stress 08/2021 showed no ischemia.  Echo 07/2021 with LVEF 60 to 65% and mild aortic regurgitation.  Cardiac clearance per telephone encounter 07/26/2023, Chart reviewed as part of pre-operative protocol coverage. Given past medical history and time since last visit, based on ACC/AHA guidelines, Kaylla Vanlue would be at acceptable risk for the planned procedure without further cardiovascular testing. Patient was advised that if she develops new symptoms prior to surgery to contact our office to arrange a follow-up appointment.  She verbalized understanding. Per office protocol, patient can hold warfarin for 5 days prior to procedure. Patient will not need bridging with Lovenox  (enoxaparin ) around procedure.  Patient reports last dose Coumadin  09/22/2023.  Last dose of Ozempic 09/15/2023.  Other pertinent history includes non-insulin -dependent DM2, hypothyroidism, CKD 2, renal carcinoma s/p right nephrectomy and chemotherapy.  Preop labs reviewed, glucose elevated 213, mild anemia hemoglobin 10.6, otherwise unremarkable. A1c pending.   EKG 05/24/23: Sinus rhythm with 1st degree A-V block. Rate 71. Right bundle branch block  Nuclear stress 08/30/21:   The study is normal. The study is low risk.   No ST deviation was noted.   LV perfusion is normal. There is no evidence of ischemia. There is no evidence of infarction.   Left ventricular function is normal. Nuclear stress EF: 65 %. The left ventricular ejection fraction is normal (55-65%). End diastolic cavity size is normal. End systolic cavity size is normal.   Prior study available for comparison from 08/15/2017. No changes compared to prior study.  TTE 08/05/21: 1. Left ventricular ejection fraction, by estimation, is 60 to 65%. The   left ventricle has normal function. The left ventricle has no regional  wall motion abnormalities. Left ventricular diastolic parameters are  consistent with Grade I diastolic  dysfunction (impaired relaxation).   2. Right ventricular systolic function is normal. The right ventricular  size is mildly enlarged.   3. Left atrial size was mildly dilated.   4. The mitral valve is normal in structure. Trivial mitral valve  regurgitation. No evidence of mitral stenosis.   5. The aortic valve is tricuspid. There is moderate calcification of the  aortic valve. Aortic valve regurgitation is mild. Aortic valve  sclerosis/calcification is present, without any evidence of aortic  stenosis.   6. The inferior vena cava is normal in size with greater than 50%  respiratory variability, suggesting right atrial pressure of 3 mmHg.      Lynwood Geofm RIGGERS [facility] Short Stay Center/Anesthesiology Phone [phone] 09/26/2023 3:37 PM

## 2023-09-26 NOTE — Anesthesia Preprocedure Evaluation (Addendum)
 Anesthesia Evaluation  Patient identified by MRN, date of birth, ID band Patient awake    Reviewed: Allergy & Precautions, NPO status , Patient's Chart, lab work & pertinent test results  History of Anesthesia Complications Negative for: history of anesthetic complications  Airway Mallampati: II  TM Distance: >3 FB Neck ROM: Full    Dental  (+) Loose, Poor Dentition, Dental Advisory Given   Pulmonary neg shortness of breath, neg sleep apnea, COPD, neg recent URI, former smoker   breath sounds clear to auscultation       Cardiovascular hypertension, Pt. on medications and Pt. on home beta blockers (-) angina + CAD  (-) Past MI + dysrhythmias Atrial Fibrillation  Rhythm:Regular   1. Left ventricular ejection fraction, by estimation, is 60 to 65%. The  left ventricle has normal function. The left ventricle has no regional  wall motion abnormalities. Left ventricular diastolic parameters are  consistent with Grade I diastolic  dysfunction (impaired relaxation).   2. Right ventricular systolic function is normal. The right ventricular  size is mildly enlarged.   3. Left atrial size was mildly dilated.   4. The mitral valve is normal in structure. Trivial mitral valve  regurgitation. No evidence of mitral stenosis.   5. The aortic valve is tricuspid. There is moderate calcification of the  aortic valve. Aortic valve regurgitation is mild. Aortic valve  sclerosis/calcification is present, without any evidence of aortic  stenosis.   6. The inferior vena cava is normal in size with greater than 50%  respiratory variability, suggesting right atrial pressure of 3 mmHg.      Neuro/Psych negative neurological ROS  negative psych ROS   GI/Hepatic negative GI ROS, Neg liver ROS,,,  Endo/Other  diabetes, Type 2Hypothyroidism    Renal/GU Lab Results      Component                Value               Date                      NA                        135                 09/25/2023                K                        3.9                 09/25/2023                CO2                      20 (L)              09/25/2023                GLUCOSE                  213 (H)             09/25/2023                BUN  25 (H)              09/25/2023                CREATININE               0.96                09/25/2023                CALCIUM                   9.7                 09/25/2023                GFR                      112.37              08/10/2014                EGFR                     63                  07/29/2021                GFRNONAA                 >60                 09/25/2023                Musculoskeletal  (+) Arthritis ,    Abdominal   Peds  Hematology  (+) Blood dyscrasia, anemia Lab Results      Component                Value               Date                      WBC                      6.7                 09/25/2023                HGB                      10.6 (L)            09/25/2023                HCT                      33.8 (L)            09/25/2023                MCV                      86.0                09/25/2023                PLT                      304  09/25/2023            Lab Results      Component                Value               Date                      INR                      1.2                 09/28/2023                INR                      2.8                 09/14/2023                INR                      2.4                 08/07/2023              Anesthesia Other Findings   Reproductive/Obstetrics                              Anesthesia Physical Anesthesia Plan  ASA: 2  Anesthesia Plan: General   Post-op Pain Management: Gabapentin  PO (pre-op)* and Tylenol  PO (pre-op)*   Induction: Intravenous  PONV Risk Score and Plan: 4 or greater and Ondansetron  and Dexamethasone   Airway  Management Planned: Oral ETT and Video Laryngoscope Planned  Additional Equipment: None  Intra-op Plan:   Post-operative Plan: Extubation in OR  Informed Consent: I have reviewed the patients History and Physical, chart, labs and discussed the procedure including the risks, benefits and alternatives for the proposed anesthesia with the patient or authorized representative who has indicated his/her understanding and acceptance.     Dental advisory given  Plan Discussed with: CRNA  Anesthesia Plan Comments: (PAT note by Lynwood Hope, PA-C:  78 year old female with pertinent history of atrial fibrillation on Coumadin  and flecainide , right bundle branch block, HTN, mild aortic insufficiency, coronary artery calcifications.  Nuclear stress 08/2021 showed no ischemia.  Echo 07/2021 with LVEF 60 to 65% and mild aortic regurgitation.  Cardiac clearance per telephone encounter 07/26/2023, Chart reviewed as part of pre-operative protocol coverage. Given past medical history and time since last visit, based on ACC/AHA guidelines, Latasha Hodges would be at acceptable risk for the planned procedure without further cardiovascular testing. Patient was advised that if she develops new symptoms prior to surgery to contact our office to arrange a follow-up appointment.  She verbalized understanding. Per office protocol, patient can hold warfarin for 5 days prior to procedure. Patient will not need bridging with Lovenox  (enoxaparin ) around procedure.  Patient reports last dose Coumadin  09/22/2023.  Last dose of Ozempic 09/15/2023.  Other pertinent history includes non-insulin -dependent DM2, hypothyroidism, CKD 2, renal carcinoma s/p right nephrectomy and chemotherapy.  Preop labs reviewed, glucose elevated 213, mild anemia hemoglobin 10.6, otherwise unremarkable. A1c pending.   EKG 05/24/23: Sinus rhythm with 1st degree A-V block. Rate 71. Right bundle branch block  Nuclear stress 08/30/21:   The  study is  normal. The study is low risk.   No ST deviation was noted.   LV perfusion is normal. There is no evidence of ischemia. There is no evidence of infarction.   Left ventricular function is normal. Nuclear stress EF: 65 %. The left ventricular ejection fraction is normal (55-65%). End diastolic cavity size is normal. End systolic cavity size is normal.   Prior study available for comparison from 08/15/2017. No changes compared to prior study.  TTE 08/05/21: 1. Left ventricular ejection fraction, by estimation, is 60 to 65%. The  left ventricle has normal function. The left ventricle has no regional  wall motion abnormalities. Left ventricular diastolic parameters are  consistent with Grade I diastolic  dysfunction (impaired relaxation).   2. Right ventricular systolic function is normal. The right ventricular  size is mildly enlarged.   3. Left atrial size was mildly dilated.   4. The mitral valve is normal in structure. Trivial mitral valve  regurgitation. No evidence of mitral stenosis.   5. The aortic valve is tricuspid. There is moderate calcification of the  aortic valve. Aortic valve regurgitation is mild. Aortic valve  sclerosis/calcification is present, without any evidence of aortic  stenosis.   6. The inferior vena cava is normal in size with greater than 50%  respiratory variability, suggesting right atrial pressure of 3 mmHg.   )         Anesthesia Quick Evaluation

## 2023-09-28 ENCOUNTER — Ambulatory Visit (HOSPITAL_COMMUNITY): Payer: Self-pay | Admitting: Physician Assistant

## 2023-09-28 ENCOUNTER — Ambulatory Visit (HOSPITAL_COMMUNITY): Admitting: Anesthesiology

## 2023-09-28 ENCOUNTER — Observation Stay (HOSPITAL_COMMUNITY)
Admission: RE | Admit: 2023-09-28 | Discharge: 2023-10-01 | Disposition: A | Discharge status: Skilled Nursing Facility | Attending: Neurological Surgery | Admitting: Neurological Surgery

## 2023-09-28 ENCOUNTER — Other Ambulatory Visit: Payer: Self-pay

## 2023-09-28 ENCOUNTER — Encounter (HOSPITAL_COMMUNITY): Payer: Self-pay | Admitting: Neurological Surgery

## 2023-09-28 ENCOUNTER — Encounter (HOSPITAL_COMMUNITY)
Admission: RE | Disposition: A | Discharge status: Skilled Nursing Facility | Payer: Self-pay | Source: Home / Self Care | Attending: Neurological Surgery

## 2023-09-28 ENCOUNTER — Ambulatory Visit (HOSPITAL_COMMUNITY)

## 2023-09-28 DIAGNOSIS — N183 Chronic kidney disease, stage 3 unspecified: Secondary | ICD-10-CM | POA: Diagnosis not present

## 2023-09-28 DIAGNOSIS — Z9889 Other specified postprocedural states: Secondary | ICD-10-CM

## 2023-09-28 DIAGNOSIS — Z6833 Body mass index (BMI) 33.0-33.9, adult: Secondary | ICD-10-CM | POA: Insufficient documentation

## 2023-09-28 DIAGNOSIS — Z87891 Personal history of nicotine dependence: Secondary | ICD-10-CM | POA: Diagnosis not present

## 2023-09-28 DIAGNOSIS — M48062 Spinal stenosis, lumbar region with neurogenic claudication: Secondary | ICD-10-CM | POA: Diagnosis not present

## 2023-09-28 DIAGNOSIS — Z981 Arthrodesis status: Secondary | ICD-10-CM | POA: Diagnosis not present

## 2023-09-28 DIAGNOSIS — J449 Chronic obstructive pulmonary disease, unspecified: Secondary | ICD-10-CM | POA: Diagnosis not present

## 2023-09-28 DIAGNOSIS — E1122 Type 2 diabetes mellitus with diabetic chronic kidney disease: Secondary | ICD-10-CM | POA: Diagnosis not present

## 2023-09-28 DIAGNOSIS — F1090 Alcohol use, unspecified, uncomplicated: Secondary | ICD-10-CM | POA: Diagnosis not present

## 2023-09-28 DIAGNOSIS — E119 Type 2 diabetes mellitus without complications: Secondary | ICD-10-CM

## 2023-09-28 DIAGNOSIS — I129 Hypertensive chronic kidney disease with stage 1 through stage 4 chronic kidney disease, or unspecified chronic kidney disease: Secondary | ICD-10-CM | POA: Insufficient documentation

## 2023-09-28 DIAGNOSIS — Z01818 Encounter for other preprocedural examination: Principal | ICD-10-CM

## 2023-09-28 DIAGNOSIS — M48061 Spinal stenosis, lumbar region without neurogenic claudication: Secondary | ICD-10-CM | POA: Diagnosis not present

## 2023-09-28 HISTORY — PX: LUMBAR LAMINECTOMY/DECOMPRESSION MICRODISCECTOMY: SHX5026

## 2023-09-28 LAB — APTT: aPTT: 30 s (ref 24–36)

## 2023-09-28 LAB — GLUCOSE, CAPILLARY
Glucose-Capillary: 173 mg/dL — ABNORMAL HIGH (ref 70–99)
Glucose-Capillary: 166 mg/dL — ABNORMAL HIGH (ref 70–99)
Glucose-Capillary: 343 mg/dL — ABNORMAL HIGH (ref 70–99)
Glucose-Capillary: 321 mg/dL — ABNORMAL HIGH (ref 70–99)

## 2023-09-28 LAB — PROTIME-INR
INR: 1.2 (ref 0.8–1.2)
Prothrombin Time: 16 s — ABNORMAL HIGH (ref 11.4–15.2)

## 2023-09-28 SURGERY — LUMBAR LAMINECTOMY/DECOMPRESSION MICRODISCECTOMY 1 LEVEL
Anesthesia: General | Site: Back | Laterality: Left

## 2023-09-28 MED ORDER — ACETAMINOPHEN 650 MG RE SUPP: 650.0000 mg | RECTAL | Status: DC | PRN

## 2023-09-28 MED ORDER — ROCURONIUM BROMIDE 10 MG/ML (PF) SYRINGE
PREFILLED_SYRINGE | INTRAVENOUS | Status: AC
  Filled 2023-09-28: qty 10

## 2023-09-28 MED ORDER — PROPOFOL 10 MG/ML IV BOLUS
INTRAVENOUS | Status: AC
  Filled 2023-09-28: qty 20

## 2023-09-28 MED ORDER — PROPOFOL 10 MG/ML IV BOLUS
INTRAVENOUS | Status: DC | PRN
  Administered 2023-09-28: 120 mg via INTRAVENOUS

## 2023-09-28 MED ORDER — BUPIVACAINE HCL (PF) 0.5 % IJ SOLN
INTRAMUSCULAR | Status: AC
  Filled 2023-09-28: qty 30

## 2023-09-28 MED ORDER — ONDANSETRON HCL 4 MG/2ML IJ SOLN: 4.0000 mg | Freq: Four times a day (QID) | INTRAMUSCULAR | Status: DC | PRN

## 2023-09-28 MED ORDER — FERROUS SULFATE 325 (65 FE) MG PO TABS
325.0000 mg | ORAL_TABLET | Freq: Every day | ORAL | Status: DC
  Administered 2023-09-29: 325 mg via ORAL
  Filled 2023-09-28 (×3): qty 1

## 2023-09-28 MED ORDER — SODIUM CHLORIDE 0.9 % IV SOLN: 250.0000 mL | INTRAVENOUS | Status: AC

## 2023-09-28 MED ORDER — INSULIN ASPART 100 UNIT/ML IJ SOLN
0.0000 [IU] | Freq: Three times a day (TID) | INTRAMUSCULAR | Status: DC
  Administered 2023-09-28: 11 [IU] via SUBCUTANEOUS
  Administered 2023-09-29 (×2): 5 [IU] via SUBCUTANEOUS
  Administered 2023-09-29: 8 [IU] via SUBCUTANEOUS
  Administered 2023-09-30 – 2023-10-01 (×5): 3 [IU] via SUBCUTANEOUS

## 2023-09-28 MED ORDER — CEFAZOLIN SODIUM-DEXTROSE 2-4 GM/100ML-% IV SOLN
2.0000 g | INTRAVENOUS | Status: AC
  Administered 2023-09-28: 2 g via INTRAVENOUS
  Filled 2023-09-28: qty 100

## 2023-09-28 MED ORDER — METHOCARBAMOL 500 MG PO TABS
500.0000 mg | ORAL_TABLET | Freq: Four times a day (QID) | ORAL | Status: DC | PRN
Start: 2023-09-28 — End: 2023-10-01
  Administered 2023-09-28 – 2023-10-01 (×6): 500 mg via ORAL
  Filled 2023-09-28 (×6): qty 1

## 2023-09-28 MED ORDER — ACETAMINOPHEN 10 MG/ML IV SOLN
1000.0000 mg | Freq: Once | INTRAVENOUS | Status: DC | PRN
Start: 2023-09-28 — End: 2023-09-28

## 2023-09-28 MED ORDER — MENTHOL 3 MG MT LOZG: 1.0000 | LOZENGE | OROMUCOSAL | Status: DC | PRN

## 2023-09-28 MED ORDER — BUPIVACAINE HCL (PF) 0.25 % IJ SOLN
INTRAMUSCULAR | Status: DC | PRN
  Administered 2023-09-28: 8 mL

## 2023-09-28 MED ORDER — OXYCODONE HCL 5 MG PO TABS
5.0000 mg | ORAL_TABLET | Freq: Once | ORAL | Status: AC | PRN
  Administered 2023-09-28: 5 mg via ORAL

## 2023-09-28 MED ORDER — CHLORHEXIDINE GLUCONATE CLOTH 2 % EX PADS: 6.0000 | MEDICATED_PAD | Freq: Once | CUTANEOUS | Status: DC

## 2023-09-28 MED ORDER — FENOFIBRATE 200 MG PO CAPS: 200.0000 mg | ORAL_CAPSULE | Freq: Every day | ORAL | Status: DC

## 2023-09-28 MED ORDER — METHOCARBAMOL 1000 MG/10ML IJ SOLN
500.0000 mg | Freq: Four times a day (QID) | INTRAMUSCULAR | Status: DC | PRN
Start: 2023-09-28 — End: 2023-10-01

## 2023-09-28 MED ORDER — FENTANYL CITRATE (PF) 100 MCG/2ML IJ SOLN
INTRAMUSCULAR | Status: AC
  Filled 2023-09-28: qty 2

## 2023-09-28 MED ORDER — ONDANSETRON HCL 4 MG/2ML IJ SOLN
INTRAMUSCULAR | Status: AC
  Filled 2023-09-28: qty 2

## 2023-09-28 MED ORDER — SENNA 8.6 MG PO TABS
1.0000 | ORAL_TABLET | Freq: Two times a day (BID) | ORAL | Status: DC
  Administered 2023-09-28 – 2023-10-01 (×6): 8.6 mg via ORAL
  Filled 2023-09-28 (×6): qty 1

## 2023-09-28 MED ORDER — ORAL CARE MOUTH RINSE: 15.0000 mL | Freq: Once | OROMUCOSAL | Status: AC

## 2023-09-28 MED ORDER — OXYCODONE HCL 5 MG/5ML PO SOLN: 5.0000 mg | Freq: Once | ORAL | Status: AC | PRN

## 2023-09-28 MED ORDER — POTASSIUM CHLORIDE IN NACL 20-0.9 MEQ/L-% IV SOLN: INTRAVENOUS | Status: DC

## 2023-09-28 MED ORDER — LIDOCAINE 2% (20 MG/ML) 5 ML SYRINGE
INTRAMUSCULAR | Status: AC
  Filled 2023-09-28: qty 5

## 2023-09-28 MED ORDER — ACETAMINOPHEN 325 MG PO TABS: 650.0000 mg | ORAL_TABLET | ORAL | Status: DC | PRN

## 2023-09-28 MED ORDER — MIDAZOLAM HCL 2 MG/2ML IJ SOLN
INTRAMUSCULAR | Status: AC
  Filled 2023-09-28: qty 2

## 2023-09-28 MED ORDER — SUGAMMADEX SODIUM 200 MG/2ML IV SOLN
INTRAVENOUS | Status: DC | PRN
  Administered 2023-09-28 (×3): 100 mg via INTRAVENOUS

## 2023-09-28 MED ORDER — LISINOPRIL 20 MG PO TABS
20.0000 mg | ORAL_TABLET | Freq: Every day | ORAL | Status: DC
  Administered 2023-09-29 – 2023-10-01 (×2): 20 mg via ORAL
  Filled 2023-09-28 (×3): qty 1

## 2023-09-28 MED ORDER — PHENOL 1.4 % MT LIQD
1.0000 | OROMUCOSAL | Status: DC | PRN
Start: 2023-09-28 — End: 2023-10-01

## 2023-09-28 MED ORDER — CELECOXIB 200 MG PO CAPS
200.0000 mg | ORAL_CAPSULE | Freq: Two times a day (BID) | ORAL | Status: DC
  Administered 2023-09-28 – 2023-09-30 (×5): 200 mg via ORAL
  Filled 2023-09-28 (×5): qty 1

## 2023-09-28 MED ORDER — DEXAMETHASONE SODIUM PHOSPHATE 10 MG/ML IJ SOLN
INTRAMUSCULAR | Status: DC | PRN
  Administered 2023-09-28: 5 mg via INTRAVENOUS

## 2023-09-28 MED ORDER — SODIUM CHLORIDE 0.9% FLUSH
3.0000 mL | INTRAVENOUS | Status: DC | PRN
Start: 2023-09-28 — End: 2023-10-01

## 2023-09-28 MED ORDER — THROMBIN 5000 UNITS EX KIT
PACK | CUTANEOUS | Status: AC
  Filled 2023-09-28: qty 1

## 2023-09-28 MED ORDER — HYDROCODONE-ACETAMINOPHEN 7.5-325 MG PO TABS
1.0000 | ORAL_TABLET | Freq: Four times a day (QID) | ORAL | Status: DC
  Administered 2023-09-28 – 2023-10-01 (×11): 1 via ORAL
  Filled 2023-09-28 (×11): qty 1

## 2023-09-28 MED ORDER — 0.9 % SODIUM CHLORIDE (POUR BTL) OPTIME
TOPICAL | Status: DC | PRN
  Administered 2023-09-28: 1000 mL

## 2023-09-28 MED ORDER — CEFAZOLIN SODIUM-DEXTROSE 2-4 GM/100ML-% IV SOLN
2.0000 g | Freq: Three times a day (TID) | INTRAVENOUS | Status: AC
  Administered 2023-09-28 (×2): 2 g via INTRAVENOUS
  Filled 2023-09-28 (×2): qty 100

## 2023-09-28 MED ORDER — INSULIN ASPART 100 UNIT/ML IJ SOLN: 0.0000 [IU] | INTRAMUSCULAR | Status: DC | PRN

## 2023-09-28 MED ORDER — FENTANYL CITRATE (PF) 100 MCG/2ML IJ SOLN
25.0000 ug | INTRAMUSCULAR | Status: DC | PRN
  Administered 2023-09-28 (×2): 25 ug via INTRAVENOUS

## 2023-09-28 MED ORDER — SODIUM CHLORIDE 0.9% FLUSH
3.0000 mL | Freq: Two times a day (BID) | INTRAVENOUS | Status: DC
  Administered 2023-09-28 – 2023-10-01 (×6): 3 mL via INTRAVENOUS

## 2023-09-28 MED ORDER — PHENYLEPHRINE HCL-NACL 20-0.9 MG/250ML-% IV SOLN
INTRAVENOUS | Status: DC | PRN
Start: 2023-09-28 — End: 2023-09-28
  Administered 2023-09-28: 30 ug/min via INTRAVENOUS

## 2023-09-28 MED ORDER — LEVOTHYROXINE SODIUM 75 MCG PO TABS
75.0000 ug | ORAL_TABLET | Freq: Every day | ORAL | Status: DC
  Administered 2023-09-29 – 2023-10-01 (×3): 75 ug via ORAL
  Filled 2023-09-28 (×3): qty 1

## 2023-09-28 MED ORDER — DEXAMETHASONE SODIUM PHOSPHATE 10 MG/ML IJ SOLN
INTRAMUSCULAR | Status: AC
  Filled 2023-09-28: qty 1

## 2023-09-28 MED ORDER — METOPROLOL SUCCINATE ER 50 MG PO TB24
50.0000 mg | ORAL_TABLET | Freq: Every day | ORAL | Status: DC
  Administered 2023-09-29 – 2023-10-01 (×3): 50 mg via ORAL
  Filled 2023-09-28 (×3): qty 1

## 2023-09-28 MED ORDER — FENTANYL CITRATE (PF) 250 MCG/5ML IJ SOLN
INTRAMUSCULAR | Status: DC | PRN
  Administered 2023-09-28: 100 ug via INTRAVENOUS
  Administered 2023-09-28: 50 ug via INTRAVENOUS

## 2023-09-28 MED ORDER — METFORMIN HCL 500 MG PO TABS
1000.0000 mg | ORAL_TABLET | Freq: Every day | ORAL | Status: DC
  Administered 2023-09-29 – 2023-10-01 (×3): 1000 mg via ORAL
  Filled 2023-09-28 (×3): qty 2

## 2023-09-28 MED ORDER — FLECAINIDE ACETATE 50 MG PO TABS
25.0000 mg | ORAL_TABLET | Freq: Two times a day (BID) | ORAL | Status: DC
  Administered 2023-09-28 – 2023-10-01 (×6): 25 mg via ORAL
  Filled 2023-09-28 (×7): qty 1

## 2023-09-28 MED ORDER — LIDOCAINE 2% (20 MG/ML) 5 ML SYRINGE
INTRAMUSCULAR | Status: DC | PRN
  Administered 2023-09-28: 60 mg via INTRAVENOUS

## 2023-09-28 MED ORDER — MIDAZOLAM HCL 2 MG/2ML IJ SOLN
INTRAMUSCULAR | Status: DC | PRN
  Administered 2023-09-28 (×2): 1 mg via INTRAVENOUS

## 2023-09-28 MED ORDER — ONDANSETRON HCL 4 MG PO TABS: 4.0000 mg | ORAL_TABLET | Freq: Four times a day (QID) | ORAL | Status: DC | PRN

## 2023-09-28 MED ORDER — OXYCODONE HCL 5 MG PO TABS
ORAL_TABLET | ORAL | Status: AC
Start: 2023-09-28 — End: 2023-09-28
  Filled 2023-09-28: qty 1

## 2023-09-28 MED ORDER — FENTANYL CITRATE (PF) 250 MCG/5ML IJ SOLN
INTRAMUSCULAR | Status: AC
  Filled 2023-09-28: qty 5

## 2023-09-28 MED ORDER — THROMBIN 5000 UNITS EX SOLR
OROMUCOSAL | Status: DC | PRN
  Administered 2023-09-28: 5 mL via TOPICAL

## 2023-09-28 MED ORDER — CHLORHEXIDINE GLUCONATE 0.12 % MT SOLN
15.0000 mL | Freq: Once | OROMUCOSAL | Status: AC
  Administered 2023-09-28: 15 mL via OROMUCOSAL
  Filled 2023-09-28: qty 15

## 2023-09-28 MED ORDER — ACETAMINOPHEN 500 MG PO TABS
1000.0000 mg | ORAL_TABLET | ORAL | Status: AC
  Administered 2023-09-28: 1000 mg via ORAL
  Filled 2023-09-28: qty 2

## 2023-09-28 MED ORDER — ONDANSETRON HCL 4 MG/2ML IJ SOLN
INTRAMUSCULAR | Status: DC | PRN
  Administered 2023-09-28: 4 mg via INTRAVENOUS

## 2023-09-28 MED ORDER — GABAPENTIN 300 MG PO CAPS
300.0000 mg | ORAL_CAPSULE | ORAL | Status: AC
  Administered 2023-09-28: 300 mg via ORAL
  Filled 2023-09-28: qty 1

## 2023-09-28 MED ORDER — ROCURONIUM BROMIDE 10 MG/ML (PF) SYRINGE
PREFILLED_SYRINGE | INTRAVENOUS | Status: DC | PRN
  Administered 2023-09-28: 50 mg via INTRAVENOUS

## 2023-09-28 MED ORDER — FLECAINIDE ACETATE 50 MG PO TABS: 50.0000 mg | ORAL_TABLET | Freq: Two times a day (BID) | ORAL | Status: DC

## 2023-09-28 MED ORDER — LACTATED RINGERS IV SOLN: INTRAVENOUS | Status: DC

## 2023-09-28 MED ORDER — FENOFIBRATE 160 MG PO TABS
160.0000 mg | ORAL_TABLET | Freq: Every day | ORAL | Status: DC
  Administered 2023-09-29 – 2023-10-01 (×3): 160 mg via ORAL
  Filled 2023-09-28 (×3): qty 1

## 2023-09-28 SURGICAL SUPPLY — 36 items
BAG COUNTER SPONGE SURGICOUNT (BAG) ×1 IMPLANT
BAND RUBBER #18 3X1/16 STRL (MISCELLANEOUS) ×2 IMPLANT
BENZOIN TINCTURE PRP APPL 2/3 (GAUZE/BANDAGES/DRESSINGS) ×1 IMPLANT
BUR CARBIDE MATCH 3.0 (BURR) ×1 IMPLANT
CANISTER SUCTION 3000ML PPV (SUCTIONS) ×1 IMPLANT
DERMABOND ADVANCED .7 DNX12 (GAUZE/BANDAGES/DRESSINGS) IMPLANT
DRAPE LAPAROTOMY 100X72X124 (DRAPES) ×1 IMPLANT
DRAPE MICROSCOPE SLANT 54X150 (MISCELLANEOUS) ×1 IMPLANT
DRAPE SURG 17X23 STRL (DRAPES) ×1 IMPLANT
DRSG OPSITE POSTOP 3X4 (GAUZE/BANDAGES/DRESSINGS) IMPLANT
DURAPREP 26ML APPLICATOR (WOUND CARE) ×1 IMPLANT
ELECTRODE REM PT RTRN 9FT ADLT (ELECTROSURGICAL) ×1 IMPLANT
GAUZE 4X4 16PLY ~~LOC~~+RFID DBL (SPONGE) IMPLANT
GLOVE BIO SURGEON STRL SZ7 (GLOVE) IMPLANT
GLOVE BIO SURGEON STRL SZ8 (GLOVE) ×1 IMPLANT
GLOVE BIOGEL PI IND STRL 7.0 (GLOVE) IMPLANT
GOWN STRL REUS W/ TWL LRG LVL3 (GOWN DISPOSABLE) IMPLANT
GOWN STRL REUS W/ TWL XL LVL3 (GOWN DISPOSABLE) ×1 IMPLANT
GOWN STRL REUS W/TWL 2XL LVL3 (GOWN DISPOSABLE) IMPLANT
HEMOSTAT POWDER KIT SURGIFOAM (HEMOSTASIS) ×1 IMPLANT
KIT BASIN OR (CUSTOM PROCEDURE TRAY) ×1 IMPLANT
KIT TURNOVER KIT B (KITS) ×1 IMPLANT
NDL HYPO 25X1 1.5 SAFETY (NEEDLE) ×1 IMPLANT
NDL SPNL 20GX3.5 QUINCKE YW (NEEDLE) IMPLANT
NEEDLE HYPO 25X1 1.5 SAFETY (NEEDLE) ×1 IMPLANT
NEEDLE SPNL 20GX3.5 QUINCKE YW (NEEDLE) IMPLANT
NS IRRIG 1000ML POUR BTL (IV SOLUTION) ×1 IMPLANT
PACK LAMINECTOMY NEURO (CUSTOM PROCEDURE TRAY) ×1 IMPLANT
PAD ARMBOARD POSITIONER FOAM (MISCELLANEOUS) ×3 IMPLANT
STRIP CLOSURE SKIN 1/2X4 (GAUZE/BANDAGES/DRESSINGS) ×1 IMPLANT
SUT VIC AB 0 CT1 18XCR BRD8 (SUTURE) ×1 IMPLANT
SUT VIC AB 2-0 CP2 18 (SUTURE) ×1 IMPLANT
SUT VIC AB 3-0 SH 8-18 (SUTURE) ×1 IMPLANT
TOWEL GREEN STERILE (TOWEL DISPOSABLE) ×1 IMPLANT
TOWEL GREEN STERILE FF (TOWEL DISPOSABLE) ×1 IMPLANT
WATER STERILE IRR 1000ML POUR (IV SOLUTION) ×1 IMPLANT

## 2023-09-28 NOTE — Anesthesia Procedure Notes (Signed)
 Procedure Name: Intubation Date/Time: 09/28/2023 7:49 AM  Performed by: Wynonia Alfonzo LABOR, CRNAPre-anesthesia Checklist: Patient identified, Emergency Drugs available, Suction available and Patient being monitored Patient Re-evaluated:Patient Re-evaluated prior to induction Oxygen Delivery Method: Circle System Utilized Preoxygenation: Pre-oxygenation with 100% oxygen Induction Type: IV induction Ventilation: Mask ventilation without difficulty Laryngoscope Size: Glidescope and 3 Grade View: Grade I Tube type: Oral Tube size: 7.5 mm Number of attempts: 1 Airway Equipment and Method: Stylet and Oral airway Placement Confirmation: ETT inserted through vocal cords under direct vision, positive ETCO2 and breath sounds checked- equal and bilateral Secured at: 22 cm Tube secured with: Tape Dental Injury: Teeth and Oropharynx as per pre-operative assessment

## 2023-09-28 NOTE — Care Management Obs Status (Signed)
 MEDICARE OBSERVATION STATUS NOTIFICATION   Patient Details  Name: Latasha Hodges MRN: [id number] Date of Birth: 23-May-1945   Medicare Observation Status Notification Given:  Yes Obs status explained to [name] Le and copy given to the patient    Claretta Deed 09/28/2023, 4:21 PM

## 2023-09-28 NOTE — Plan of Care (Signed)
  Problem: Education: Goal: Knowledge of General Education information will improve Description: Including pain rating scale, medication(s)/side effects and non-pharmacologic comfort measures Outcome: Progressing   Problem: Health Behavior/Discharge Planning: Goal: Ability to manage health-related needs will improve Outcome: Progressing   Problem: Clinical Measurements: Goal: Ability to maintain clinical measurements within normal limits will improve Outcome: Progressing Goal: Will remain free from infection Outcome: Progressing Goal: Diagnostic test results will improve Outcome: Progressing Goal: Respiratory complications will improve Outcome: Progressing Goal: Cardiovascular complication will be avoided Outcome: Progressing   Problem: Activity: Goal: Risk for activity intolerance will decrease Outcome: Progressing   Problem: Nutrition: Goal: Adequate nutrition will be maintained Outcome: Progressing   Problem: Coping: Goal: Level of anxiety will decrease Outcome: Progressing   Problem: Elimination: Goal: Will not experience complications related to bowel motility Outcome: Progressing Goal: Will not experience complications related to urinary retention Outcome: Progressing   Problem: Pain Managment: Goal: General experience of comfort will improve and/or be controlled Outcome: Progressing   Problem: Safety: Goal: Ability to remain free from injury will improve Outcome: Progressing   Problem: Fluid Volume: Goal: Ability to maintain a balanced intake and output will improve Outcome: Progressing   Problem: Coping: Goal: Ability to adjust to condition or change in health will improve Outcome: Progressing   Problem: Education: Goal: Ability to describe self-care measures that may prevent or decrease complications (Diabetes Survival Skills Education) will improve Outcome: Progressing Goal: Individualized Educational Video(s) Outcome: Progressing   Problem:  Health Behavior/Discharge Planning: Goal: Ability to identify and utilize available resources and services will improve Outcome: Progressing Goal: Ability to manage health-related needs will improve Outcome: Progressing   Problem: Tissue Perfusion: Goal: Adequacy of tissue perfusion will improve Outcome: Progressing   Problem: Skin Integrity: Goal: Risk for impaired skin integrity will decrease Outcome: Progressing   Problem: Nutritional: Goal: Maintenance of adequate nutrition will improve Outcome: Progressing Goal: Progress toward achieving an optimal weight will improve Outcome: Progressing   Problem: Education: Goal: Ability to verbalize activity precautions or restrictions will improve Outcome: Progressing Goal: Knowledge of the prescribed therapeutic regimen will improve Outcome: Progressing Goal: Understanding of discharge needs will improve Outcome: Progressing   Problem: Bowel/Gastric: Goal: Gastrointestinal status for postoperative course will improve Outcome: Progressing   Problem: Clinical Measurements: Goal: Ability to maintain clinical measurements within normal limits will improve Outcome: Progressing Goal: Postoperative complications will be avoided or minimized Outcome: Progressing Goal: Diagnostic test results will improve Outcome: Progressing   Problem: Health Behavior/Discharge Planning: Goal: Identification of resources available to assist in meeting health care needs will improve Outcome: Progressing   Problem: Bladder/Genitourinary: Goal: Urinary functional status for postoperative course will improve Outcome: Progressing

## 2023-09-28 NOTE — Transfer of Care (Signed)
 Immediate Anesthesia Transfer of Care Note  Patient: Latasha Hodges  Procedure(s) Performed: Laminectomy and Foraminotomy - Lumbar Two-Lumbar Three - left with sublaminar decompression (Left: Back)  Patient Location: PACU  Anesthesia Type:General  Level of Consciousness: drowsy  Airway & Oxygen Therapy: Patient Spontanous Breathing and Patient connected to face mask oxygen  Post-op Assessment: Report given to RN and Post -op Vital signs reviewed and stable  Post vital signs: Reviewed and stable  Last Vitals:  Vitals Value Taken Time  BP 128/51 09/28/23 09:19  Temp    Pulse 58 09/28/23 09:21  Resp 14 09/28/23 09:21  SpO2 99 % 09/28/23 09:21  Vitals shown include unfiled device data.  Last Pain:  Vitals:   09/28/23 9361  TempSrc:   PainSc: 0-No pain         Complications: No notable events documented.

## 2023-09-28 NOTE — TOC Initial Note (Signed)
 Transition of Care [facility]) - Initial/Assessment Note    Patient Details  Name: Latasha Hodges MRN: [id number] Date of Birth: 1946-01-04  [facility] CM/SW Contact:    Latasha CHRISTELLA Goodie, LCSW Phone Number: 09/28/2023, 3:40 PM  Clinical Narrative:    CSW met with patient and daughter at bedside to discuss discharge to SNF. Patient had spoken with staff at [facility] to go there for rehab post-surgery, and asked CSW to make sure she could get there tomorrow. CSW answered questions and discussed how rehab placement process worked, patient indicated understanding. CSW contacted [facility], they would have a bed available for patient if insurance approved. Awaiting therapy evaluations tomorrow. CSW to follow.               Expected Discharge Plan: Skilled Nursing Facility Barriers to Discharge: Continued Medical Work up, English as a second language teacher   Patient Goals and CMS Choice Patient states their goals for this hospitalization and ongoing recovery are:: to get to Latasha Hodges for rehab CMS Medicare.gov Compare Post Acute Care list provided to:: Patient Choice offered to / list presented to : Patient [place] ownership interest in [facility].provided to:: Patient    Expected Discharge Plan and Services     Post Acute Care Choice: Skilled Nursing Facility Living arrangements for the past 2 months: Single Family Home                                      Prior Living Arrangements/Services Living arrangements for the past 2 months: Single Family Home Lives with:: Self Patient language and need for interpreter reviewed:: No Do you feel safe going back to the place where you live?: Yes      Need for Family Participation in Patient Care: No (Comment) Care giver support system in place?: No (comment)   Criminal Activity/Legal Involvement Pertinent to Current Situation/Hospitalization: No - Comment as needed  Activities of Daily Living   ADL Screening (condition at  time of admission) Independently performs ADLs?: Yes (appropriate for developmental age)  Permission Sought/Granted Permission sought to share information with : Facility Medical sales representative, Family Supports Permission granted to share information with : Yes, Verbal Permission Granted  Share Information with NAME: Latasha Hodges  Permission granted to share info w AGENCY: [facility]  Permission granted to share info w Relationship: Daughter     Emotional Assessment Appearance:: Appears stated age Attitude/Demeanor/Rapport: Engaged Affect (typically observed): Appropriate Orientation: : Oriented to Self, Oriented to Place, Oriented to  Time, Oriented to Situation Alcohol / Substance Use: Not Applicable Psych Involvement: No (comment)  Admission diagnosis:  Spinal stenosis of lumbar region with neurogenic claudication [M48.062] S/P lumbar laminectomy [Z98.890] Patient Active Problem List   Diagnosis Date Noted   Pruritus 05/30/2022   Other adverse food reactions, not elsewhere classified, subsequent encounter 05/30/2022   Hypercoagulable state due to persistent atrial fibrillation (HCC) 05/25/2022   Encounter for monitoring flecainide  therapy 05/25/2022   [place] coronary artery calcium  score less than 100 04/29/2021   Port-A-Cath in place 03/02/2021   Aortic atherosclerosis (HCC) 02/01/2021   S/P lumbar laminectomy 07/14/2020   Essential (primary) hypertension 04/20/2020   Neoplasm of right kidney 01/29/2020   Renal mass 12/23/2019   RBBB (right bundle branch block) 11/05/2017   Chronic pain of right knee 10/16/2017   Hyperlipidemia 08/09/2017   Encounter for therapeutic drug monitoring 11/20/2014   Atrial fibrillation (HCC)    Acquired spondylolisthesis 01/17/2013  Diabetes mellitus (HCC) 10/04/2010   Body mass index (BMI) 35.0-35.9, adult 10/04/2010   PCP:  Vernon Velna SAUNDERS, MD Pharmacy:   [facility] DRUG STORE [phone] [place], [place] - [address] AT [facility] OF [address] &  [address] [place] [address] Phone: [phone] Fax: [phone]  [facility] DRUG STORE #[id number] [place], [place] - [address] AT [facility] OF [address] [place] [address] Phone: [phone] Fax: [phone]  [facility] [place], [place] - [id number] [place] [address] Phone: [phone] Fax: [phone]     Social Drivers of Health (SDOH) Social History: SDOH Screenings   Food Insecurity: No Food Insecurity (08/31/2022)  Housing: Low Risk  (08/31/2022)  Transportation Needs: No Transportation Needs (08/31/2022)  Utilities: Not At Risk (08/31/2022)  Depression (PHQ2-9): Low Risk  (08/31/2022)  Social Connections: Unknown (06/28/2021)   Received from [facility]  Tobacco Use: Medium Risk (09/28/2023)   SDOH Interventions:     Readmission Risk Interventions     No data to display

## 2023-09-28 NOTE — Progress Notes (Signed)
 Lunch ordered

## 2023-09-28 NOTE — Op Note (Signed)
 09/28/2023  9:13 AM  PATIENT:  Latasha Hodges  78 y.o. female  PRE-OPERATIVE DIAGNOSIS: Spinal stenosis L2-3 with back pain and neurogenic claudication  POST-OPERATIVE DIAGNOSIS:  same  PROCEDURE: Decompressive lumbar hemilaminectomy medial facetectomy and foraminotomies L2-3 on the left with sublaminar decompression  SURGEON:  Alm Molt, MD  ASSISTANTS: Meyran FNP  ANESTHESIA:   General  EBL: 50 ml  Total I/O In: 1000 [I.V.:1000] Out: -   BLOOD ADMINISTERED: none  DRAINS: none  SPECIMEN:  none  INDICATION FOR PROCEDURE: This patient presented with back pain and hip pain with ambulation. Imaging showed spinal stenosis L2-3. The patient tried conservative measures without relief. Pain was debilitating. Recommended decompressive laminectomy L2-3. Patient understood the risks, benefits, and alternatives and potential outcomes and wished to proceed.  PROCEDURE DETAILS: The patient was taken to the operating room and after induction of adequate generalized endotracheal anesthesia, the patient was rolled into the prone position on the Wilson frame and all pressure points were padded. The lumbar region was cleaned and then prepped with DuraPrep and draped in the usual sterile fashion. 5 cc of local anesthesia was injected and then a dorsal midline incision was made and carried down to the lumbo sacral fascia. The fascia was opened and the paraspinous musculature was taken down in a subperiosteal fashion to expose L2-3 on the left. Intraoperative x-ray confirmed my level, and then I used a combination of the high-speed drill and the Kerrison punches to perform a hemilaminectomy, medial facetectomy, and foraminotomy at L2-3 on the left. The underlying yellow ligament was opened and removed in a piecemeal fashion to expose the underlying dura and exiting nerve root. I undercut the lateral recess and dissected down until I was medial to and distal to the pedicle. The nerve root was well  decompressed.  We then drilled up under the spinous process and the opposite lamina and remove the yellow ligament from the right-hand side through a sublaminar approach.  I then palpated with a coronary dilator along the nerve root and into the foramen to assure adequate decompression. I felt no more compression of the nerve root. I irrigated with saline solution containing bacitracin . Achieved hemostasis with bipolar cautery, lined the dura with Gelfoam, and then closed the fascia with 0 Vicryl. I closed the subcutaneous tissues with 2-0 Vicryl and the subcuticular tissues with 3-0 Vicryl. The skin was then closed with benzoin and Steri-Strips. The drapes were removed, a sterile dressing was applied.  My nurse practitioner was involved in the exposure, safe retraction of the neural elements, the decompression and the closure. the patient was awakened from general anesthesia and transferred to the recovery room in stable condition. At the end of the procedure all sponge, needle and instrument counts were correct.    PLAN OF CARE: Admit for overnight observation  PATIENT DISPOSITION:  PACU - hemodynamically stable.   Delay start of Pharmacological VTE agent (>24hrs) due to surgical blood loss or risk of bleeding:  yes

## 2023-09-28 NOTE — H&P (Signed)
 Subjective: Patient is a 78 y.o. female admitted for lumbar stenosis. Onset of symptoms was several months ago, gradually worsening since that time.  The pain is rated severe, and is located at the across the lower back and radiates to hips. The pain is described as aching and occurs all day. The symptoms have been progressive. Symptoms are exacerbated by exercise, standing, and walking for more than a few minutes. MRI or CT showed stenosis L2-3. Previous LL for stenosis in past   Past Medical History:  Diagnosis Date   Acquired solitary kidney    left side;   s/p right nephrectomy 12/ 2021   Agatston coronary artery calcium  score less than 100    Coronary calcium  score was 27 which is low at 54th percentile for age and sex matched controls.   Anticoagulated on Coumadin     long-term;  managed by cardiology   Aortic atherosclerosis (HCC)    Arthritis    Chronic kidney disease (CKD), stage III (moderate) (HCC)    COPD (chronic obstructive pulmonary disease) (HCC)    very mild case per pt   Coronary artery calcification    Hyperlipidemia, mixed    Hypertension    Hypothyroidism    Left wrist fracture    Malignant neoplasm of kidney, right (HCC) 01/2020   oncologist--- dr amadeo;  dx 12/ 2021  Stage IIIa,  Clear cell type;  01-29-2020  s/p robotic right radical nephrectomy (one posisitve node);  chemo 03-31-2020 to  08-11-2020   Mild aortic insufficiency    Persistent atrial fibrillation [facility])    cardiologist--- dr t. turner;  nuclear sress test-- 08-15-2017 low risk normal perfusion, nuclear ef 61%;  event monitor 12-18-2017 in epic   RBBB (right bundle branch block)    chronic   Type 2 diabetes mellitus (HCC)     Past Surgical History:  Procedure Laterality Date   COLONOSCOPY  12/29/2010   Procedure: COLONOSCOPY;  Surgeon: Toribio Cedar, MD;  Location: [facility];  Service: Endoscopy;  Laterality: N/A;   HARDWARE REMOVAL Left 05/02/2021   Procedure: left wrist removal of hardware-  dorsal spanning plate;  Surgeon: Alyse Agent, MD;  Location: [facility];  Service: Orthopedics;  Laterality: Left;   IR IMAGING GUIDED PORT INSERTION  07/08/2020   LUMBAR LAMINECTOMY/DECOMPRESSION MICRODISCECTOMY Left 07/14/2020   Procedure: Laminectomy and Foraminotomy - Lumbar three-Lumbar four- Lumbar four-Lumbar five - left with sublaminar decompression;  Surgeon: Joshua Alm RAMAN, MD;  Location: [facility] OR;  Service: Neurosurgery;  Laterality: Left;   ORIF WRIST FRACTURE Left 02/10/2021   Procedure: OPEN REDUCTION INTERNAL FIXATION (ORIF) WRIST FRACTURE;  Surgeon: Alyse Agent, MD;  Location: [facility];  Service: Orthopedics;  Laterality: Left;  with MAC   ROBOT ASSISTED LAPAROSCOPIC NEPHRECTOMY N/A 01/29/2020   Procedure: XI ROBOTIC ASSISTED LAPAROSCOPIC RADICAL NEPHRECTOMY;  Surgeon: Renda Glance, MD;  Location: [facility];  Service: Urology;  Laterality: N/A;   TUBAL LIGATION  1980    Prior to Admission medications   Medication Sig Start Date End Date Taking? Authorizing Provider  fenofibrate  micronized (LOFIBRA) 200 MG capsule TAKE 1 CAPSULE BY MOUTH DAILY BEFORE BREAKFAST 07/16/23  Yes Turner, Traci R, MD  ferrous sulfate  325 (65 FE) MG tablet Take 325 mg by mouth daily.   Yes [provider]  flecainide  (TAMBOCOR ) 50 MG tablet Take 1/2 tablet by mouth twice daily 05/24/23  Yes Suarez, Joseph J, PA-C  levothyroxine  (SYNTHROID ) 75 MCG tablet Take 75 mcg by mouth daily before breakfast.   Yes  [provider]  lisinopril  (ZESTRIL ) 20 MG tablet TAKE 1 TABLET(20 MG) BY MOUTH DAILY 07/31/23  Yes Turner, Wilbert SAUNDERS, MD  magnesium  oxide (MAG-OX) 400 (240 Mg) MG tablet Take 400 mg by mouth daily.   Yes [provider]  metFORMIN  (GLUCOPHAGE ) 1000 MG tablet Take 1,000 mg by mouth daily with breakfast. 01/07/14  Yes [provider]  metoprolol  succinate (TOPROL -XL) 50 MG 24 hr tablet Take 1 tablet (50 mg total) by mouth daily. Take with or immediately following a  meal. 07/04/23  Yes Terra Fairy PARAS, PA-C  OZEMPIC, 2 MG/DOSE, 8 MG/3ML SOPN Inject 2 mg into the skin once a week. 04/12/23  Yes [provider]  rosuvastatin  (CRESTOR ) 40 MG tablet TAKE 1 TABLET(40 MG) BY MOUTH DAILY 06/25/23  Yes Turner, Traci R, MD  warfarin (COUMADIN ) 5 MG tablet TAKE 1 TABLET BY MOUTH DAILY AS DIRECTED BY COUMADIN  CLINIC Patient taking differently: Take 2.5-5 mg by mouth See admin instructions. Take 5 mg by mouth on Mondays and take 2.5 mg Tuesday-Sunday 04/02/23  Yes Turner, Wilbert SAUNDERS, MD   Allergies  Allergen Reactions   Iodinated Contrast Media Hives, Swelling and Rash   Fish Allergy Other (See Comments)   Iodine Other (See Comments)   Pembrolizumab  Other (See Comments)    Severe gum pain, caused thyroid  issues   Shellfish Allergy Other (See Comments)   No [facility] Food Allergies Rash    Seafood   Penicillins Rash    Childhood reaction.   Sulfa Antibiotics Hives and Rash   Theophyllines Rash    Social History   Tobacco Use   Smoking status: Former    Current packs/day: 0.00    Types: Cigarettes    Quit date: 12/28/1981    Years since quitting: 41.7   Smokeless tobacco: Never  Substance Use Topics   Alcohol use: Yes    Comment: rarely    Family History  Problem Relation Age of Onset   Cancer Mother        BRAIN/THROAT   Diabetes Father    Hypertension Father    Colonic polyp Cousin    Colon cancer Neg Hx    Allergic rhinitis Neg Hx    Angioedema Neg Hx    Asthma Neg Hx    Eczema Neg Hx    Urticaria Neg Hx      Review of Systems  Positive ROS: neg  All other systems have been reviewed and were otherwise negative with the exception of those mentioned in the HPI and as above.  Objective: Vital signs in last 24 hours: Temp:  [98.4 F (36.9 C)] 98.4 F (36.9 C) (08/15 0616) Pulse Rate:  [70] 70 (08/15 0616) Resp:  [18] 18 (08/15 0616) BP: (154)/(69) 154/69 (08/15 0616) SpO2:  [97 %] 97 % (08/15 0616) Weight:  [87.5 kg] 87.5 kg  (08/15 0616)  General Appearance: Alert, cooperative, no distress, appears stated age Head: Normocephalic, without obvious abnormality, atraumatic Eyes: PERRL, conjunctiva/corneas clear, EOM's intact    Neck: Supple, symmetrical, trachea midline Back: Symmetric, no curvature, ROM normal, no CVA tenderness Lungs:  respirations unlabored Heart: Regular rate and rhythm Abdomen: Soft, non-tender Extremities: Extremities normal, atraumatic, no cyanosis or edema Pulses: 2+ and symmetric all extremities Skin: Skin color, texture, turgor normal, no rashes or lesions  NEUROLOGIC:   Mental status: Alert and oriented x4,  no aphasia, good attention span, fund of knowledge, and memory Motor Exam - grossly normal Sensory Exam - grossly normal Reflexes: 1+ Coordination -  grossly normal Gait - grossly normal Balance - grossly normal Cranial Nerves: I: smell Not tested  II: visual acuity  OS: nl    OD: nl  II: visual fields Full to confrontation  II: pupils Equal, round, reactive to light  III,VII: ptosis None  III,IV,VI: extraocular muscles  Full ROM  V: mastication Normal  V: facial light touch sensation  Normal  V,VII: corneal reflex  Present  VII: facial muscle function - upper  Normal  VII: facial muscle function - lower Normal  VIII: hearing Not tested  IX: soft palate elevation  Normal  IX,X: gag reflex Present  XI: trapezius strength  5/5  XI: sternocleidomastoid strength 5/5  XI: neck flexion strength  5/5  XII: tongue strength  Normal    Data Review Lab Results  Component Value Date   WBC 6.7 09/25/2023   HGB 10.6 (L) 09/25/2023   HCT 33.8 (L) 09/25/2023   MCV 86.0 09/25/2023   PLT 304 09/25/2023   Lab Results  Component Value Date   NA 135 09/25/2023   K 3.9 09/25/2023   CL 105 09/25/2023   CO2 20 (L) 09/25/2023   BUN 25 (H) 09/25/2023   CREATININE 0.96 09/25/2023   GLUCOSE 213 (H) 09/25/2023   Lab Results  Component Value Date   INR 1.2 09/28/2023     Assessment/Plan:  Estimated body mass index is 33.65 kg/m as calculated from the following:   Height as of this encounter: 5' 3.5 (1.613 m).   Weight as of this encounter: 87.5 kg. Patient admitted for LL L2-3. Patient has failed a reasonable attempt at conservative therapy.  I explained the condition and procedure to the patient and answered any questions.  Patient wishes to proceed with procedure as planned. Understands risks/ benefits and typical outcomes of procedure.   Alm GORMAN Molt 09/28/2023 7:28 AM

## 2023-09-29 DIAGNOSIS — M48062 Spinal stenosis, lumbar region with neurogenic claudication: Secondary | ICD-10-CM | POA: Diagnosis not present

## 2023-09-29 DIAGNOSIS — E1122 Type 2 diabetes mellitus with diabetic chronic kidney disease: Secondary | ICD-10-CM | POA: Diagnosis not present

## 2023-09-29 DIAGNOSIS — N183 Chronic kidney disease, stage 3 unspecified: Secondary | ICD-10-CM | POA: Diagnosis not present

## 2023-09-29 DIAGNOSIS — J449 Chronic obstructive pulmonary disease, unspecified: Secondary | ICD-10-CM | POA: Diagnosis not present

## 2023-09-29 DIAGNOSIS — Z87891 Personal history of nicotine dependence: Secondary | ICD-10-CM | POA: Diagnosis not present

## 2023-09-29 DIAGNOSIS — Z981 Arthrodesis status: Secondary | ICD-10-CM | POA: Diagnosis not present

## 2023-09-29 DIAGNOSIS — I129 Hypertensive chronic kidney disease with stage 1 through stage 4 chronic kidney disease, or unspecified chronic kidney disease: Secondary | ICD-10-CM | POA: Diagnosis not present

## 2023-09-29 LAB — GLUCOSE, CAPILLARY
Glucose-Capillary: 259 mg/dL — ABNORMAL HIGH (ref 70–99)
Glucose-Capillary: 228 mg/dL — ABNORMAL HIGH (ref 70–99)
Glucose-Capillary: 223 mg/dL — ABNORMAL HIGH (ref 70–99)
Glucose-Capillary: 233 mg/dL — ABNORMAL HIGH (ref 70–99)

## 2023-09-29 MED ORDER — HYDROCODONE-ACETAMINOPHEN 7.5-325 MG PO TABS: 1.0000 | ORAL_TABLET | ORAL | 0 refills | Status: DC | PRN

## 2023-09-29 MED ORDER — METHOCARBAMOL 500 MG PO TABS: 500.0000 mg | ORAL_TABLET | Freq: Three times a day (TID) | ORAL | 1 refills | Status: DC | PRN

## 2023-09-29 NOTE — Evaluation (Signed)
 Occupational Therapy Evaluation Patient Details Name: Latasha Hodges MRN: [id number] DOB: [birth year] Today's Date: 09/29/2023   History of Present Illness   78 y.o. female admitted 8/15 and underwent  Decompressive lumbar hemilaminectomy medial facetectomy and foraminotomies L2-3 on the left with sublaminar decompression.     Clinical Impressions PTA, pt lived alone and reports being independent. Upon eval, pt performing UB ADL with set-up and LB ADL with up to min A as well as transfers with up to min A. Pt educated and demonstrating use of compensatory techniques for bed mobility, LB ADL, grooming, toileting, and shower transfers within precautions. Due to pt with limited support and currently requires external assist for ADL, Patient will benefit from continued inpatient follow up therapy, <3 hours/day.       If plan is discharge home, recommend the following:   A little help with walking and/or transfers;A little help with bathing/dressing/bathroom;Assistance with cooking/housework;Assist for transportation;Help with stairs or ramp for entrance     Functional Status Assessment   Patient has had a recent decline in their functional status and demonstrates the ability to make significant improvements in function in a reasonable and predictable amount of time.     Equipment Recommendations   None recommended by OT     Recommendations for Other Services         Precautions/Restrictions   Precautions Precautions: Back;Fall Precaution Booklet Issued: Yes (comment) Recall of Precautions/Restrictions: Intact (with indirect cues) Restrictions Weight Bearing Restrictions Per Provider Order: No     Mobility Bed Mobility               General bed mobility comments: OOB in chair    Transfers Overall transfer level: Needs assistance Equipment used: Rolling walker (2 wheels) Transfers: Sit to/from Stand Sit to Stand: Min assist           General  transfer comment: Min assist for boost to stand after attempting on her own. Cues for hand placement and precautions. Stable upon rising with RW for support.      Balance Overall balance assessment: Needs assistance Sitting-balance support: No upper extremity supported, Feet supported Sitting balance-Leahy Scale: Good     Standing balance support: No upper extremity supported Standing balance-Leahy Scale: Fair Standing balance comment: More stable with AD                           ADL either performed or assessed with clinical judgement   ADL Overall ADL's : Needs assistance/impaired Eating/Feeding: Set up;Sitting   Grooming: Contact guard assist;Standing   Upper Body Bathing: Set up;Sitting   Lower Body Bathing: Minimal assistance;Sit to/from stand   Upper Body Dressing : Set up;Sitting   Lower Body Dressing: Minimal assistance;Sit to/from stand   Toilet Transfer: Minimal assistance;Ambulation;Contact guard assist;Rolling walker (2 wheels)     Toileting - Clothing Manipulation Details (indicate cue type and reason): reviewed compensatory techniques     Functional mobility during ADLs: Contact guard assist;Rolling walker (2 wheels)       Vision         Perception         Praxis         Pertinent Vitals/Pain Pain Assessment Pain Assessment: Faces Faces Pain Scale: Hurts even more Pain Location: back Pain Descriptors / Indicators: Operative site guarding Pain Intervention(s): Limited activity within patient's tolerance, Monitored during session     Extremity/Trunk Assessment Upper Extremity Assessment Upper Extremity Assessment: Generalized weakness   Lower  Extremity Assessment Lower Extremity Assessment: Defer to PT evaluation       Communication Communication Communication: No apparent difficulties   Cognition Arousal: Alert Behavior During Therapy: WFL for tasks assessed/performed Cognition: No apparent impairments                                Following commands: Intact       Cueing  General Comments   Cueing Techniques: Verbal cues      Exercises     Shoulder Instructions      Home Living Family/patient expects to be discharged to:: Skilled nursing facility Living Arrangements: Alone Available Help at Discharge: Friend(s);Available PRN/intermittently Type of Home: House Home Access: Level entry     Home Layout: One level     Bathroom Shower/Tub: Producer, television/film/video: Handicapped height Bathroom Accessibility: Yes   Home Equipment: Rollator (4 wheels);Cane - quad;Shower seat          Prior Functioning/Environment Prior Level of Function : Driving;Independent/Modified Independent             Mobility Comments: ind, using quad cane/4WW ADLs Comments: ind, uses shower bench    OT Problem List: Decreased strength;Decreased activity tolerance;Impaired balance (sitting and/or standing);Decreased knowledge of use of DME or AE;Decreased knowledge of precautions;Pain   OT Treatment/Interventions: Self-care/ADL training;Therapeutic exercise;DME and/or AE instruction;Therapeutic activities;Patient/family education;Balance training      OT Goals(Current goals can be found in the care plan section)   Acute Rehab OT Goals Patient Stated Goal: get better OT Goal Formulation: With patient/family Time For Goal Achievement: 10/13/23 Potential to Achieve Goals: Good   OT Frequency:  Min 2X/week    Co-evaluation              AM-PAC OT 6 Clicks Daily Activity     Outcome Measure Help from another person eating meals?: None Help from another person taking care of personal grooming?: A Little Help from another person toileting, which includes using toliet, bedpan, or urinal?: A Little Help from another person bathing (including washing, rinsing, drying)?: A Little Help from another person to put on and taking off regular upper body clothing?: A Little Help  from another person to put on and taking off regular lower body clothing?: A Little 6 Click Score: 19   End of Session Equipment Utilized During Treatment: Gait belt;Rolling walker (2 wheels) Nurse Communication: Mobility status  Activity Tolerance: Patient tolerated treatment well Patient left: in chair;with call bell/phone within reach;with chair alarm set;with family/visitor present  OT Visit Diagnosis: Unsteadiness on feet (R26.81);Muscle weakness (generalized) (M62.81);Pain Pain - part of body:  (back)                Time: 8680-8660 OT Time Calculation (min): 20 min Charges:  OT General Charges $OT Visit: 1 Visit OT Evaluation $OT Eval Low Complexity: 1 Low  Elma JONETTA Lebron FREDERICK, OTR/L [facility] Acute Rehabilitation Office: [phone]   Elma JONETTA Lebron 09/29/2023, 1:44 PM

## 2023-09-29 NOTE — Progress Notes (Signed)
 Patient ID: Latasha Hodges, female   DOB: 1945/06/18, 78 y.o.   MRN: [id number] Doing well, walked to the nursing station with PT, back sore as expected no leg pain or NTW, awaiting SNF

## 2023-09-29 NOTE — Plan of Care (Signed)

## 2023-09-29 NOTE — TOC Progression Note (Signed)
 Transition of Care [facility]) - Progression Note    Patient Details  Name: Latasha Hodges MRN: [id number] Date of Birth: 01-27-1946  Transition of Care [facility]) CM/SW Contact  Gwenn Frieze [place], [place] Phone Number: 09/29/2023, 1:55 PM  Clinical Narrative:  Home and Community/[employer] auth request submitted for [facility], reference [id number]. Will provide updates as available.   Frieze Gwenn, MSW, LCSW [phone] (coverage)       Expected Discharge Plan: Skilled Nursing Facility Barriers to Discharge: Continued Medical Work up, English as a second language teacher               Expected Discharge Plan and Services     Post Acute Care Choice: Skilled Nursing Facility Living arrangements for the past 2 months: Single Family Home                                       Social Drivers of Health (SDOH) Interventions SDOH Screenings   Food Insecurity: No Food Insecurity (09/28/2023)  Housing: Low Risk  (09/28/2023)  Transportation Needs: No Transportation Needs (09/28/2023)  Utilities: Not At Risk (09/28/2023)  Depression (PHQ2-9): Low Risk  (08/31/2022)  Social Connections: Socially Isolated (09/28/2023)  Tobacco Use: Medium Risk (09/28/2023)    Readmission Risk Interventions     No data to display

## 2023-09-29 NOTE — [place] FL2 (Signed)
 [place]  MEDICAID [id number] LEVEL OF CARE FORM     IDENTIFICATION  Patient Name: Latasha Hodges Birthdate: November 13, 1945 Sex: female Admission Date (Current Location): 09/28/2023  [facility] and [place] Number:  Producer, television/film/video and Address:  [facility] [place], [address] [place], [place] [address]      Provider Number: [id number]  Attending Physician Name and Address:  Joshua Alm Hamilton, MD  Relative Name and Phone Number:       Current Level of Care: Hospital Recommended Level of Care: Skilled Nursing Facility Prior Approval Number:    Date Approved/Denied:   PASRR Number: [id number]  Discharge Plan: SNF    Current Diagnoses: Patient Active Problem List   Diagnosis Date Noted   Pruritus 05/30/2022   Other adverse food reactions, not elsewhere classified, subsequent encounter 05/30/2022   Hypercoagulable state due to persistent atrial fibrillation (HCC) 05/25/2022   Encounter for monitoring flecainide  therapy 05/25/2022   [place]atston coronary artery calcium  score less than 100 04/29/2021   Port-A-Cath in place 03/02/2021   Aortic atherosclerosis (HCC) 02/01/2021   S/P lumbar laminectomy 07/14/2020   Essential (primary) hypertension 04/20/2020   Neoplasm of right kidney 01/29/2020   Renal mass 12/23/2019   RBBB (right bundle branch block) 11/05/2017   Chronic pain of right knee 10/16/2017   Hyperlipidemia 08/09/2017   Encounter for therapeutic drug monitoring 11/20/2014   Atrial fibrillation (HCC)    Acquired spondylolisthesis 01/17/2013   Diabetes mellitus (HCC) 10/04/2010   Body mass index (BMI) 35.0-35.9, adult 10/04/2010    Orientation RESPIRATION BLADDER Height & Weight     Self, Time, Situation, Place  Normal Continent Weight: 193 lb (87.5 kg) Height:  5' 3.5 (161.3 cm)  BEHAVIORAL SYMPTOMS/MOOD NEUROLOGICAL BOWEL NUTRITION STATUS      Continent Diet (See dc summary)  AMBULATORY STATUS COMMUNICATION OF NEEDS Skin   Limited Assist  Verbally Surgical wounds (closed incision on back)                       Personal Care Assistance Level of Assistance  Bathing, Feeding, Dressing Bathing Assistance: Limited assistance Feeding assistance: Independent Dressing Assistance: Limited assistance     Functional Limitations Info             SPECIAL CARE FACTORS FREQUENCY  PT (By licensed PT), OT (By licensed OT)     PT Frequency: 5x/week OT Frequency: 5x/week            Contractures Contractures Info: Not present    Additional Factors Info  Code Status, Allergies Code Status Info: Full Allergies Info: Iodinated Contrast Media, Fish Allergy, Iodine, Pembrolizumab , Shellfish Allergy, No [employer]alt[employer] Food Allergies, Penicillins, Sulfa Antibiotics, Theophyllines           Current Medications (09/29/2023):  This is the current hospital active medication list Current Facility-Administered Medications  Medication Dose Route Frequency Provider Last Rate Last Admin   0.9 % NaCl with KCl 20 mEq/ L  infusion   Intravenous Continuous Joshua Alm Hamilton, MD       acetaminophen  (TYLENOL ) tablet 650 mg  650 mg Oral Q4H PRN Joshua Alm Hamilton, MD       Or   acetaminophen  (TYLENOL ) suppository 650 mg  650 mg Rectal Q4H PRN Joshua Alm Hamilton, MD       celecoxib  (CELEBREX ) capsule 200 mg  200 mg Oral Q12H Joshua Alm Hamilton, MD   200 mg at 09/29/23 9046   fenofibrate  tablet 160 mg  160 mg  Oral Q breakfast Reome, Earle J, RPH   160 mg at 09/29/23 9044   ferrous sulfate  tablet 325 mg  325 mg Oral Daily Joshua Alm Hamilton, MD   325 mg at 09/29/23 0955   flecainide  (TAMBOCOR ) tablet 25 mg  25 mg Oral Q12H Reome, Earle J, RPH   25 mg at 09/29/23 9044   HYDROcodone -acetaminophen  (NORCO) 7.5-325 MG per tablet 1 tablet  1 tablet Oral Q6H Joshua Alm Hamilton, MD   1 tablet at 09/29/23 1221   insulin  aspart (novoLOG ) injection 0-15 Units  0-15 Units Subcutaneous TID WC Joshua Alm Hamilton, MD   5 Units at 09/29/23 1222    levothyroxine  (SYNTHROID ) tablet 75 mcg  75 mcg Oral QAC breakfast Joshua Alm Hamilton, MD   75 mcg at 09/29/23 9395   lisinopril  (ZESTRIL ) tablet 20 mg  20 mg Oral Daily Joshua Alm Hamilton, MD   20 mg at 09/29/23 9044   menthol -cetylpyridinium (CEPACOL) lozenge 3 mg  1 lozenge Oral PRN Joshua Alm Hamilton, MD       Or   phenol (CHLORASEPTIC) mouth spray 1 spray  1 spray Mouth/Throat PRN Joshua Alm Hamilton, MD       metFORMIN  (GLUCOPHAGE ) tablet 1,000 mg  1,000 mg Oral Q breakfast Joshua Alm Hamilton, MD   1,000 mg at 09/29/23 9044   methocarbamol  (ROBAXIN ) tablet 500 mg  500 mg Oral Q6H PRN Joshua Alm Hamilton, MD   500 mg at 09/29/23 9395   Or   methocarbamol  (ROBAXIN ) injection 500 mg  500 mg Intravenous Q6H PRN Joshua Alm Hamilton, MD       metoprolol  succinate (TOPROL -XL) 24 hr tablet 50 mg  50 mg Oral Daily Joshua Alm Hamilton, MD   50 mg at 09/29/23 1007   ondansetron  (ZOFRAN ) tablet 4 mg  4 mg Oral Q6H PRN Joshua Alm Hamilton, MD       Or   ondansetron  (ZOFRAN ) injection 4 mg  4 mg Intravenous Q6H PRN Joshua Alm Hamilton, MD       senna [facility]) tablet 8.6 mg  1 tablet Oral BID Joshua Alm Hamilton, MD   8.6 mg at 09/29/23 9044   sodium chloride  flush (NS) 0.9 % injection 3 mL  3 mL Intravenous Q12H Joshua Alm Hamilton, MD   3 mL at 09/29/23 1008   sodium chloride  flush (NS) 0.9 % injection 3 mL  3 mL Intravenous PRN Joshua Alm Hamilton, MD         Discharge Medications: Please see discharge summary for a list of discharge medications.  Relevant Imaging Results:  Relevant Lab Results:   Additional Information SSN  [id number]  Inocente GORMAN Kindle, LCSW

## 2023-09-29 NOTE — Evaluation (Signed)
 Physical Therapy Evaluation Patient Details Name: Latasha Hodges MRN: [id number] DOB: 07-Mar-1945 Today's Date: 09/29/2023  History of Present Illness  78 y.o. female admitted 8/15 and underwent  Decompressive lumbar hemilaminectomy medial facetectomy and foraminotomies L2-3 on the left with sublaminar decompression.  Clinical Impression  Patient is s/p above surgery resulting in functional limitations due to the deficits listed below (see PT Problem List). Previously independent with use of a quad cane but ambulatory and prolonged standing ability severely limited by back pain. Pt complains mostly of post surgical pain today, notices she can stand and walk further distances with reduced symptoms. Required CGA for bed mobility, min assist to safely stand, and up to CGA with mild instability for gait using a RW today. Lives alone with very limited support. Patient will benefit from continued inpatient follow up therapy, <3 hours/day. Patient will benefit from acute skilled PT to increase their independence and safety with mobility to facilitate discharge.     If plan is discharge home, recommend the following: A little help with walking and/or transfers;A little help with bathing/dressing/bathroom;Assistance with cooking/housework;Assist for transportation   Can travel by private vehicle   Yes    Equipment Recommendations None recommended by PT  Recommendations for Other Services       Functional Status Assessment Patient has had a recent decline in their functional status and demonstrates the ability to make significant improvements in function in a reasonable and predictable amount of time.     Precautions / Restrictions Precautions Precautions: Back;Fall Precaution Booklet Issued: Yes (comment) Recall of Precautions/Restrictions: Intact Restrictions Weight Bearing Restrictions Per Provider Order: No      Mobility  Bed Mobility Overal bed mobility: Needs Assistance Bed  Mobility: Rolling, Sidelying to Sit Rolling: Contact guard assist, Used rails Sidelying to sit: Contact guard assist       General bed mobility comments: Educated on log roll technique with extensive cues for precautions and sequencing. CGA to roll and rise to EOB. Required rail to pull self into sidelying position.    Transfers Overall transfer level: Needs assistance Equipment used: Rolling walker (2 wheels) Transfers: Sit to/from Stand Sit to Stand: Min assist           General transfer comment: Min assist for boost to stand after attempting on her own. Cues for hand placement and precautions. Stable upon rising with RW for support.    Ambulation/Gait Ambulation/Gait assistance: Contact guard assist Gait Distance (Feet): 125 Feet Assistive device: Rolling walker (2 wheels) Gait Pattern/deviations: Step-through pattern, Staggering right Gait velocity: dec Gait velocity interpretation: <1.31 ft/sec, indicative of household ambulator   General Gait Details: Completed majority of distance without issues at a supervision level however in congested areas shows increased difficulty navigating RW, with one episode of staggering towards Rt. CGA for safety but able to stablize herself with RW. Educated on safe AD use, awareness, and findings. Shows heavy heel strike, reports baseline.  Stairs            Wheelchair Mobility     Tilt Bed    Modified Rankin (Stroke Patients Only)       Balance Overall balance assessment: Needs assistance Sitting-balance support: No upper extremity supported, Feet supported Sitting balance-Leahy Scale: Good     Standing balance support: No upper extremity supported Standing balance-Leahy Scale: Fair Standing balance comment: More stable with AD  Pertinent Vitals/Pain Pain Assessment Pain Assessment: Faces Faces Pain Scale: Hurts even more Pain Location: back Pain Descriptors / Indicators:  Operative site guarding Pain Intervention(s): Limited activity within patient's tolerance, Monitored during session, Repositioned    Home Living Family/patient expects to be discharged to:: Skilled nursing facility Living Arrangements: Alone Available Help at Discharge: Friend(s);Available PRN/intermittently Type of Home: House Home Access: Level entry       Home Layout: One level Home Equipment: Rollator (4 wheels);Cane - quad;Shower seat      Prior Function Prior Level of Function : Driving;Independent/Modified Independent             Mobility Comments: ind, using quad cane/4WW ADLs Comments: ind, uses shower bench     Extremity/Trunk Assessment   Upper Extremity Assessment Upper Extremity Assessment: Defer to OT evaluation    Lower Extremity Assessment Lower Extremity Assessment: Generalized weakness       Communication   Communication Communication: No apparent difficulties    Cognition Arousal: Alert Behavior During Therapy: WFL for tasks assessed/performed   PT - Cognitive impairments: No apparent impairments                         Following commands: Intact       Cueing Cueing Techniques: Verbal cues     General Comments General comments (skin integrity, edema, etc.): Reviewed precautions and safety with mobility. Handout provided    Exercises     Assessment/Plan    PT Assessment Patient needs continued PT services  PT Problem List Decreased strength;Decreased range of motion;Decreased activity tolerance;Decreased balance;Decreased mobility;Decreased knowledge of precautions;Pain       PT Treatment Interventions DME instruction;Gait training;Functional mobility training;Therapeutic exercise;Therapeutic activities;Balance training;Neuromuscular re-education;Patient/family education;Modalities    PT Goals (Current goals can be found in the Care Plan section)  Acute Rehab PT Goals Patient Stated Goal: Be able to care for herself  before going back home. Wants to go to rehab at SNF PT Goal Formulation: With patient Time For Goal Achievement: 10/06/23 Potential to Achieve Goals: Good    Frequency Min 3X/week     Co-evaluation               AM-PAC PT 6 Clicks Mobility  Outcome Measure Help needed turning from your back to your side while in a flat bed without using bedrails?: A Little Help needed moving from lying on your back to sitting on the side of a flat bed without using bedrails?: A Little Help needed moving to and from a bed to a chair (including a wheelchair)?: A Little Help needed standing up from a chair using your arms (e.g., wheelchair or bedside chair)?: A Little Help needed to walk in hospital room?: A Little Help needed climbing 3-5 steps with a railing? : A Lot 6 Click Score: 17    End of Session Equipment Utilized During Treatment: Gait belt Activity Tolerance: Patient tolerated treatment well Patient left: in chair;with call bell/phone within reach;with chair alarm set;with nursing/sitter in room Nurse Communication: Mobility status PT Visit Diagnosis: Unsteadiness on feet (R26.81);Other abnormalities of gait and mobility (R26.89);Pain;Difficulty in walking, not elsewhere classified (R26.2) Pain - part of body:  (back)    Time: 9093-9056 PT Time Calculation (min) (ACUTE ONLY): 37 min   Charges:   PT Evaluation $PT Eval Low Complexity: 1 Low PT Treatments $Therapeutic Activity: 8-22 mins PT General Charges $$ ACUTE PT VISIT: 1 Visit         Leontine Roads, PT,  DPT [facility]  Rehabilitation Services Physical Therapist Office: [phone] Website: [email or web address]   Leontine GORMAN Roads 09/29/2023, 10:00 AM

## 2023-09-29 NOTE — TOC Progression Note (Signed)
 Transition of Care [facility]) - Progression Note    Patient Details  Name: Latasha Hodges MRN: [id number] Date of Birth: 01/30/1946  Transition of Care [facility]) CM/SW Contact  Inocente GORMAN Kindle, LCSW Phone Number: 09/29/2023, 12:32 PM  Clinical Narrative:    RNCM assisting in placing an OT order, which is required in order to start insurance for SNF. CSW updated [facility]. Unit CSW to follow up.   Expected Discharge Plan: Skilled Nursing Facility Barriers to Discharge: Continued Medical Work up, English as a second language teacher               Expected Discharge Plan and Services     Post Acute Care Choice: Skilled Nursing Facility Living arrangements for the past 2 months: Single Family Home                                       Social Drivers of Health (SDOH) Interventions SDOH Screenings   Food Insecurity: No Food Insecurity (09/28/2023)  Housing: Low Risk  (09/28/2023)  Transportation Needs: No Transportation Needs (09/28/2023)  Utilities: Not At Risk (09/28/2023)  Depression (PHQ2-9): Low Risk  (08/31/2022)  Social Connections: Socially Isolated (09/28/2023)  Tobacco Use: Medium Risk (09/28/2023)    Readmission Risk Interventions     No data to display

## 2023-09-30 DIAGNOSIS — M48062 Spinal stenosis, lumbar region with neurogenic claudication: Secondary | ICD-10-CM | POA: Diagnosis not present

## 2023-09-30 DIAGNOSIS — J449 Chronic obstructive pulmonary disease, unspecified: Secondary | ICD-10-CM | POA: Diagnosis not present

## 2023-09-30 DIAGNOSIS — Z981 Arthrodesis status: Secondary | ICD-10-CM | POA: Diagnosis not present

## 2023-09-30 DIAGNOSIS — E1122 Type 2 diabetes mellitus with diabetic chronic kidney disease: Secondary | ICD-10-CM | POA: Diagnosis not present

## 2023-09-30 DIAGNOSIS — Z87891 Personal history of nicotine dependence: Secondary | ICD-10-CM | POA: Diagnosis not present

## 2023-09-30 DIAGNOSIS — N183 Chronic kidney disease, stage 3 unspecified: Secondary | ICD-10-CM | POA: Diagnosis not present

## 2023-09-30 DIAGNOSIS — I129 Hypertensive chronic kidney disease with stage 1 through stage 4 chronic kidney disease, or unspecified chronic kidney disease: Secondary | ICD-10-CM | POA: Diagnosis not present

## 2023-09-30 LAB — GLUCOSE, CAPILLARY
Glucose-Capillary: 188 mg/dL — ABNORMAL HIGH (ref 70–99)
Glucose-Capillary: 199 mg/dL — ABNORMAL HIGH (ref 70–99)
Glucose-Capillary: 198 mg/dL — ABNORMAL HIGH (ref 70–99)
Glucose-Capillary: 219 mg/dL — ABNORMAL HIGH (ref 70–99)

## 2023-09-30 LAB — PROTIME-INR
INR: 1.1 (ref 0.8–1.2)
Prothrombin Time: 15 s (ref 11.4–15.2)

## 2023-09-30 MED ORDER — WARFARIN SODIUM 2.5 MG PO TABS: 2.5000 mg | ORAL_TABLET | ORAL | Status: DC

## 2023-09-30 NOTE — Plan of Care (Signed)
  Problem: Education: Goal: Knowledge of General Education information will improve Description: Including pain rating scale, medication(s)/side effects and non-pharmacologic comfort measures Outcome: Progressing   Problem: Health Behavior/Discharge Planning: Goal: Ability to manage health-related needs will improve Outcome: Progressing   Problem: Clinical Measurements: Goal: Ability to maintain clinical measurements within normal limits will improve Outcome: Progressing Goal: Will remain free from infection Outcome: Progressing Goal: Diagnostic test results will improve Outcome: Progressing Goal: Respiratory complications will improve Outcome: Progressing Goal: Cardiovascular complication will be avoided Outcome: Progressing   Problem: Activity: Goal: Risk for activity intolerance will decrease Outcome: Progressing   Problem: Nutrition: Goal: Adequate nutrition will be maintained Outcome: Progressing   Problem: Coping: Goal: Level of anxiety will decrease Outcome: Progressing   Problem: Elimination: Goal: Will not experience complications related to bowel motility Outcome: Progressing Goal: Will not experience complications related to urinary retention Outcome: Progressing   Problem: Pain Managment: Goal: General experience of comfort will improve and/or be controlled Outcome: Progressing   Problem: Safety: Goal: Ability to remain free from injury will improve Outcome: Progressing   Problem: Coping: Goal: Ability to adjust to condition or change in health will improve Outcome: Progressing   Problem: Fluid Volume: Goal: Ability to maintain a balanced intake and output will improve Outcome: Progressing   Problem: Health Behavior/Discharge Planning: Goal: Ability to identify and utilize available resources and services will improve Outcome: Progressing Goal: Ability to manage health-related needs will improve Outcome: Progressing

## 2023-09-30 NOTE — TOC Progression Note (Signed)
 Transition of Care [facility]) - Progression Note    Patient Details  Name: Latasha Hodges MRN: [id number] Date of Birth: 1945/09/20  Transition of Care [facility]) CM/SW Contact  Gwenn Frieze [place], [place] Phone Number: 09/30/2023, 11:14 AM  Clinical Narrative: Home and Community/UHC auth for [facility] remains pending at this time. Will provide updates as available.   Frieze Gwenn, MSW, LCSW [phone] (coverage)        Expected Discharge Plan: Skilled Nursing Facility Barriers to Discharge: Continued Medical Work up, English as a second language teacher               Expected Discharge Plan and Services     Post Acute Care Choice: Skilled Nursing Facility Living arrangements for the past 2 months: Single Family Home                                       Social Drivers of Health (SDOH) Interventions SDOH Screenings   Food Insecurity: No Food Insecurity (09/28/2023)  Housing: Low Risk  (09/28/2023)  Transportation Needs: No Transportation Needs (09/28/2023)  Utilities: Not At Risk (09/28/2023)  Depression (PHQ2-9): Low Risk  (08/31/2022)  Social Connections: Socially Isolated (09/28/2023)  Tobacco Use: Medium Risk (09/28/2023)    Readmission Risk Interventions     No data to display

## 2023-09-30 NOTE — Progress Notes (Signed)
 Patient ID: Latasha Hodges, female   DOB: 25-Sep-1945, 78 y.o.   MRN: [id number] Subjective: Patient reports mild appropriate postoperative back soreness, preoperative pain appears to be gone.  Walking well.  No leg pain or numbness tingling or weakness  Objective: Vital signs in last 24 hours: Temp:  [97.6 F (36.4 C)-98.9 F (37.2 C)] 97.6 F (36.4 C) (08/17 0807) Pulse Rate:  [57-66] 57 (08/17 0807) Resp:  [16-19] 16 (08/17 0807) BP: (114-138)/(44-73) 124/49 (08/17 0807) SpO2:  [96 %-100 %] 96 % (08/17 0807)  Intake/Output from previous day: No intake/output data recorded. Intake/Output this shift: No intake/output data recorded.  Neurologic: Grossly normal  Lab Results: Lab Results  Component Value Date   WBC 6.7 09/25/2023   HGB 10.6 (L) 09/25/2023   HCT 33.8 (L) 09/25/2023   MCV 86.0 09/25/2023   PLT 304 09/25/2023   Lab Results  Component Value Date   INR 1.2 09/28/2023   BMET Lab Results  Component Value Date   NA 135 09/25/2023   K 3.9 09/25/2023   CL 105 09/25/2023   CO2 20 (L) 09/25/2023   GLUCOSE 213 (H) 09/25/2023   BUN 25 (H) 09/25/2023   CREATININE 0.96 09/25/2023   CALCIUM  9.7 09/25/2023    Studies/Results: No results found.  Assessment/Plan: Doing well.  Awaiting skilled nursing facility placement  Estimated body mass index is 33.65 kg/m as calculated from the following:   Height as of this encounter: 5' 3.5 (1.613 m).   Weight as of this encounter: 87.5 kg.    LOS: 0 days    Alm GORMAN Molt 09/30/2023, 9:42 AM

## 2023-10-01 ENCOUNTER — Encounter (HOSPITAL_COMMUNITY): Payer: Self-pay | Admitting: Neurological Surgery

## 2023-10-01 DIAGNOSIS — J449 Chronic obstructive pulmonary disease, unspecified: Secondary | ICD-10-CM | POA: Diagnosis not present

## 2023-10-01 DIAGNOSIS — R2689 Other abnormalities of gait and mobility: Secondary | ICD-10-CM | POA: Diagnosis not present

## 2023-10-01 DIAGNOSIS — Z87891 Personal history of nicotine dependence: Secondary | ICD-10-CM | POA: Diagnosis not present

## 2023-10-01 DIAGNOSIS — Z981 Arthrodesis status: Secondary | ICD-10-CM | POA: Diagnosis not present

## 2023-10-01 DIAGNOSIS — R279 Unspecified lack of coordination: Secondary | ICD-10-CM | POA: Diagnosis not present

## 2023-10-01 DIAGNOSIS — Z743 Need for continuous supervision: Secondary | ICD-10-CM | POA: Diagnosis not present

## 2023-10-01 DIAGNOSIS — E1165 Type 2 diabetes mellitus with hyperglycemia: Secondary | ICD-10-CM | POA: Diagnosis not present

## 2023-10-01 DIAGNOSIS — I700 Atherosclerosis of aorta: Secondary | ICD-10-CM | POA: Diagnosis not present

## 2023-10-01 DIAGNOSIS — E1122 Type 2 diabetes mellitus with diabetic chronic kidney disease: Secondary | ICD-10-CM | POA: Diagnosis not present

## 2023-10-01 DIAGNOSIS — M6281 Muscle weakness (generalized): Secondary | ICD-10-CM | POA: Diagnosis not present

## 2023-10-01 DIAGNOSIS — D6869 Other thrombophilia: Secondary | ICD-10-CM | POA: Diagnosis not present

## 2023-10-01 DIAGNOSIS — N183 Chronic kidney disease, stage 3 unspecified: Secondary | ICD-10-CM | POA: Diagnosis not present

## 2023-10-01 DIAGNOSIS — M48062 Spinal stenosis, lumbar region with neurogenic claudication: Secondary | ICD-10-CM | POA: Diagnosis not present

## 2023-10-01 DIAGNOSIS — I129 Hypertensive chronic kidney disease with stage 1 through stage 4 chronic kidney disease, or unspecified chronic kidney disease: Secondary | ICD-10-CM | POA: Diagnosis not present

## 2023-10-01 DIAGNOSIS — Z7901 Long term (current) use of anticoagulants: Secondary | ICD-10-CM | POA: Diagnosis not present

## 2023-10-01 DIAGNOSIS — E785 Hyperlipidemia, unspecified: Secondary | ICD-10-CM | POA: Diagnosis not present

## 2023-10-01 DIAGNOSIS — D631 Anemia in chronic kidney disease: Secondary | ICD-10-CM | POA: Diagnosis not present

## 2023-10-01 DIAGNOSIS — E039 Hypothyroidism, unspecified: Secondary | ICD-10-CM | POA: Diagnosis not present

## 2023-10-01 DIAGNOSIS — I4819 Other persistent atrial fibrillation: Secondary | ICD-10-CM | POA: Diagnosis not present

## 2023-10-01 DIAGNOSIS — R531 Weakness: Secondary | ICD-10-CM | POA: Diagnosis not present

## 2023-10-01 LAB — GLUCOSE, CAPILLARY
Glucose-Capillary: 168 mg/dL — ABNORMAL HIGH (ref 70–99)
Glucose-Capillary: 182 mg/dL — ABNORMAL HIGH (ref 70–99)

## 2023-10-01 LAB — PROTIME-INR
INR: 1.1 (ref 0.8–1.2)
Prothrombin Time: 15.3 s — ABNORMAL HIGH (ref 11.4–15.2)

## 2023-10-01 MED ORDER — WARFARIN SODIUM 5 MG PO TABS: 5.0000 mg | ORAL_TABLET | Freq: Once | ORAL | 0 refills | Status: DC

## 2023-10-01 MED ORDER — WARFARIN SODIUM 5 MG PO TABS: 5.0000 mg | ORAL_TABLET | Freq: Once | ORAL | Status: DC

## 2023-10-01 MED ORDER — WARFARIN SODIUM 5 MG PO TABS: 2.5000 mg | ORAL_TABLET | ORAL | Status: AC

## 2023-10-01 MED ORDER — WARFARIN - PHARMACIST DOSING INPATIENT: Freq: Every day | Status: DC

## 2023-10-01 NOTE — Discharge Summary (Cosign Needed Addendum)
 Physician Discharge Summary  Patient ID: Latasha Hodges MRN: [id number] DOB/AGE: 1945-07-31 78 y.o.  Admit date: 09/28/2023 Discharge date: 10/01/2023  Admission Diagnoses: Lumbar spinal stenosis    Discharge Diagnoses: Same status postlaminectomy   Discharged Condition: good  Hospital Course: The patient was admitted on 09/28/2023 and taken to the operating room where the patient underwent lumbar laminectomy for spinal stenosis. The patient tolerated the procedure well and was taken to the recovery room and then to the floor in stable condition. The hospital course was routine. There were no complications. The wound remained clean dry and intact. Pt had appropriate back soreness. No complaints of leg pain or new N/T/W. The patient remained afebrile with stable vital signs, and tolerated a regular diet. The patient continued to increase activities, and pain was well controlled with oral pain medications.   Consults: None  Significant Diagnostic Studies:  Results for orders placed or performed during the hospital encounter of 09/28/23  Hemoglobin A1c   Collection Time: 09/25/23  2:01 PM  Result Value Ref Range   Hgb A1c MFr Bld 8.1 (H) 4.8 - 5.6 %   Mean Plasma Glucose 185.77 mg/dL  APTT   Collection Time: 09/28/23  6:15 AM  Result Value Ref Range   aPTT 30 24 - 36 seconds  Protime-INR   Collection Time: 09/28/23  6:15 AM  Result Value Ref Range   Prothrombin Time 16.0 (H) 11.4 - 15.2 seconds   INR 1.2 0.8 - 1.2  Glucose, capillary   Collection Time: 09/28/23  6:21 AM  Result Value Ref Range   Glucose-Capillary 173 (H) 70 - 99 mg/dL  Glucose, capillary   Collection Time: 09/28/23  9:18 AM  Result Value Ref Range   Glucose-Capillary 166 (H) 70 - 99 mg/dL  Glucose, capillary   Collection Time: 09/28/23  3:38 PM  Result Value Ref Range   Glucose-Capillary 343 (H) 70 - 99 mg/dL  Glucose, capillary   Collection Time: 09/28/23  9:08 PM  Result Value Ref Range    Glucose-Capillary 321 (H) 70 - 99 mg/dL   Comment 1 Notify RN    Comment 2 Document in Chart   Glucose, capillary   Collection Time: 09/29/23  6:41 AM  Result Value Ref Range   Glucose-Capillary 259 (H) 70 - 99 mg/dL   Comment 1 Notify RN    Comment 2 Document in Chart   Glucose, capillary   Collection Time: 09/29/23 12:19 PM  Result Value Ref Range   Glucose-Capillary 228 (H) 70 - 99 mg/dL   Comment 1 Notify RN    Comment 2 Document in Chart   Glucose, capillary   Collection Time: 09/29/23  3:23 PM  Result Value Ref Range   Glucose-Capillary 223 (H) 70 - 99 mg/dL   Comment 1 Notify RN    Comment 2 Document in Chart   Glucose, capillary   Collection Time: 09/29/23  9:36 PM  Result Value Ref Range   Glucose-Capillary 233 (H) 70 - 99 mg/dL   Comment 1 Notify RN    Comment 2 Document in Chart   Glucose, capillary   Collection Time: 09/30/23  6:32 AM  Result Value Ref Range   Glucose-Capillary 188 (H) 70 - 99 mg/dL  Glucose, capillary   Collection Time: 09/30/23 12:01 PM  Result Value Ref Range   Glucose-Capillary 199 (H) 70 - 99 mg/dL   Comment 1 Notify RN    Comment 2 Document in Chart   Protime-INR   Collection Time: 09/30/23 12:35  PM  Result Value Ref Range   Prothrombin Time 15.0 11.4 - 15.2 seconds   INR 1.1 0.8 - 1.2  Glucose, capillary   Collection Time: 09/30/23  5:20 PM  Result Value Ref Range   Glucose-Capillary 198 (H) 70 - 99 mg/dL   Comment 1 Notify RN    Comment 2 Document in Chart   Glucose, capillary   Collection Time: 09/30/23  9:17 PM  Result Value Ref Range   Glucose-Capillary 219 (H) 70 - 99 mg/dL  Protime-INR   Collection Time: 10/01/23  3:21 AM  Result Value Ref Range   Prothrombin Time 15.3 (H) 11.4 - 15.2 seconds   INR 1.1 0.8 - 1.2  Glucose, capillary   Collection Time: 10/01/23  6:23 AM  Result Value Ref Range   Glucose-Capillary 168 (H) 70 - 99 mg/dL  Glucose, capillary   Collection Time: 10/01/23 11:49 AM  Result Value Ref Range    Glucose-Capillary 182 (H) 70 - 99 mg/dL   Comment 1 Notify RN     DG Lumbar Spine 2-3 Views Result Date: 09/28/2023 CLINICAL DATA:  461500 Elective surgery 461500 EXAM: LUMBAR SPINE - 2-3 VIEW COMPARISON:  02/23/2022. FINDINGS: Intraoperative localization at L2-L3. IMPRESSION: Intraoperative localization at L2-L3. Electronically Signed   By: Harrietta Sherry M.D.   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 Active surveillance. * Tracking Code: BO * EXAM: CT CHEST, ABDOMEN AND PELVIS WITHOUT CONTRAST TECHNIQUE: Multidetector CT imaging of the chest, abdomen and pelvis was performed following the standard protocol without IV contrast. RADIATION DOSE REDUCTION: This exam was performed according to the departmental dose-optimization program which includes automated exposure control, adjustment of the mA and/or kV according to patient size and/or use of iterative reconstruction technique. COMPARISON:  CT chest, abdomen, and pelvis dated 03/01/2023 FINDINGS: CT CHEST FINDINGS Cardiovascular: Right chest wall port terminates at the superior cavoatrial junction. Normal heart size. No significant pericardial fluid/thickening. Great vessels are normal in course and caliber. No central pulmonary emboli. Coronary artery calcifications. Mediastinum/Nodes: Imaged thyroid  gland without nodules meeting criteria for imaging follow-up by size. Normal esophagus. No pathologically enlarged axillary, supraclavicular, mediastinal, or hilar lymph nodes. Lungs/Pleura: The central airways are patent. Scattered peripheral predominant pulmonary nodules measuring up to 4 mm (3:92) are unchanged. No new or enlarging pulmonary nodules. No focal consolidation. No pneumothorax. No pleural effusion. Musculoskeletal: No acute or abnormal lytic or blastic osseous lesions. Multilevel degenerative changes of the  thoracic spine. CT ABDOMEN PELVIS FINDINGS Hepatobiliary: No focal hepatic lesions. No intra or extrahepatic biliary ductal dilation. Normal gallbladder. Pancreas: No focal lesions or main ductal dilation. Spleen: Normal in size without focal abnormality. Adrenals/Urinary Tract: No adrenal nodules. Right nephrectomy. No suspicious renal masses or recurrent soft tissue by noncontrast technique. No hydronephrosis or radiopaque calculi. No focal bladder wall thickening. Stomach/Bowel: Normal appearance of the stomach. No evidence of bowel wall thickening, distention, or inflammatory changes. Normal appendix. Vascular/Lymphatic: Aortic atherosclerosis. No enlarged abdominal or pelvic lymph nodes. Reproductive: Unchanged simple-appearing right adnexal cyst measures 2.1 cm (2:92). No new adnexal masses. Coarse uterine calcifications, likely leiomyoma. Other: No free fluid, fluid collection, or free air. Similar to slightly decreased size of 14.2 x 9.1 cm fat density right retroperitoneal lesion (2:77), previously 14.2 x 9.9 cm, likely lipoma. Musculoskeletal: No acute or abnormal lytic or blastic osseous findings. Multilevel degenerative changes of the lumbar spine. Similar intramuscular  lipoma in the right gluteus minimus and left gluteus medius. IMPRESSION: 1. Right nephrectomy without evidence of local recurrence or metastatic disease in the chest, abdomen, or pelvis. 2. Unchanged scattered peripheral predominant pulmonary nodules measuring up to 4 mm. No new or enlarging pulmonary nodules. 3. Aortic Atherosclerosis (ICD10-I70.0). Coronary artery calcifications. Assessment for potential risk factor modification, dietary therapy or pharmacologic therapy may be warranted, if clinically indicated. Electronically Signed   By: Limin  Xu M.D.   On: 09/13/2023 18:25    Antibiotics:  Anti-infectives (From admission, onward)    Start     Dose/Rate Route Frequency Ordered Stop   09/28/23 1530  ceFAZolin  (ANCEF ) IVPB  2g/100 mL premix        2 g 200 mL/hr over 30 Minutes Intravenous Every 8 hours 09/28/23 1037 09/29/23 0700   09/28/23 0600  ceFAZolin  (ANCEF ) IVPB 2g/100 mL premix        2 g 200 mL/hr over 30 Minutes Intravenous On call to O.R. 09/28/23 0547 09/28/23 0753       Discharge Exam: Blood pressure (!) 120/56, pulse (!) 56, temperature (!) 97.5 F (36.4 C), temperature source Oral, resp. rate 18, height 5' 3.5 (1.613 m), weight 87.5 kg, SpO2 98%. Neurologic: Grossly normal Dressing clean dry and intact  Discharge Medications:   Allergies as of 10/01/2023       Reactions   Iodinated Contrast Media Hives, Swelling, Rash   Fish Allergy Other (See Comments)   Iodine Other (See Comments)   Pembrolizumab  Other (See Comments)   Severe gum pain, caused thyroid  issues   Shellfish Allergy Other (See Comments)   No Healt[employer] Food Allergies Rash   Seafood   Penicillins Rash   Childhood reaction.   Sulfa Antibiotics Hives, Rash   Theophyllines Rash        Medication List     TAKE these medications    fenofibrate  micronized 200 MG capsule Commonly known as: LOFIBRA TAKE 1 CAPSULE BY MOUTH DAILY BEFORE BREAKFAST   ferrous sulfate  325 (65 FE) MG tablet Take 325 mg by mouth daily.   flecainide  50 MG tablet Commonly known as: TAMBOCOR  Take 1/2 tablet by mouth twice daily   HYDROcodone -acetaminophen  7.5-325 MG tablet Commonly known as: NORCO Take 1 tablet by mouth every 4 (four) hours as needed for moderate pain (pain score 4-6).   levothyroxine  75 MCG tablet Commonly known as: SYNTHROID  Take 75 mcg by mouth daily before breakfast.   lisinopril  20 MG tablet Commonly known as: ZESTRIL  TAKE 1 TABLET(20 MG) BY MOUTH DAILY   magnesium  oxide 400 (240 Mg) MG tablet Commonly known as: MAG-OX Take 400 mg by mouth daily.   metFORMIN  1000 MG tablet Commonly known as: GLUCOPHAGE  Take 1,000 mg by mouth daily with breakfast.   methocarbamol  500 MG tablet Commonly known as:  ROBAXIN  Take 1 tablet (500 mg total) by mouth every 8 (eight) hours as needed for muscle spasms.   metoprolol  succinate 50 MG 24 hr tablet Commonly known as: TOPROL -XL Take 1 tablet (50 mg total) by mouth daily. Take with or immediately following a meal.   Ozempic (2 MG/DOSE) 8 MG/3ML Sopn Generic drug: Semaglutide (2 MG/DOSE) Inject 2 mg into the skin once a week.   rosuvastatin  40 MG tablet Commonly known as: CRESTOR  TAKE 1 TABLET(40 MG) BY MOUTH DAILY   warfarin 5 MG tablet Commonly known as: COUMADIN  Take as directed. If you are unsure how to take this medication, talk to your nurse or doctor. Original instructions: Take 0.5-1  tablets (2.5-5 mg total) by mouth See admin instructions. Take 5 mg by mouth on Mondays and take 2.5 mg Tuesday-Sunday What changed: See the new instructions.   warfarin 5 MG tablet Commonly known as: COUMADIN  Take as directed. If you are unsure how to take this medication, talk to your nurse or doctor. Original instructions: Take 1 tablet (5 mg total) by mouth one time only at 4 PM. What changed: You were already taking a medication with the same name, and this prescription was added. Make sure you understand how and when to take each.        Disposition: SNF   Final Dx: Lumbar laminectomy for stenosis  Discharge Instructions     Call MD for:  difficulty breathing, headache or visual disturbances   Complete by: As directed    Call MD for:  persistant nausea and vomiting   Complete by: As directed    Call MD for:  redness, tenderness, or signs of infection (pain, swelling, redness, odor or green/yellow discharge around incision site)   Complete by: As directed    Call MD for:  severe uncontrolled pain   Complete by: As directed    Call MD for:  temperature >100.4   Complete by: As directed    Diet - low sodium heart healthy   Complete by: As directed    Increase activity slowly   Complete by: As directed    Remove dressing in 48 hours    Complete by: As directed         Contact information for follow-up providers     Joshua, Alm Hamilton, MD. Schedule an appointment as soon as possible for a visit in 2 week(s).   Specialty: Neurosurgery Contact information: [address] [place] [address] [phone]              Contact information for after-discharge care     Destination     [facility] [place] .   Service: Skilled Nursing Contact information: [address] [place]  [address] [phone]                      Signed: Suzen Lacks [facility] 10/01/2023, 12:19 PM

## 2023-10-01 NOTE — Progress Notes (Signed)
 Report has been given to nurse Nyesha at [facility]. All questions answered.

## 2023-10-01 NOTE — TOC Transition Note (Signed)
 Transition of Care [facility]) - Discharge Note   Patient Details  Name: Latasha Hodges MRN: [id number] Date of Birth: Oct 02, 1945  Transition of Care [facility]) CM/SW Contact:  [name], LCSW Phone Number: 10/01/2023, 1:17 PM   Clinical Narrative:   CSW coordinated with [facility] for patient's insurance authorization. Authorization was under clinical review, asking for documentation that patient is medically stable. CSW uploaded discharge summary and received authorization for patient to admit to SNF. [facility] has a bed available for patient today. CSW met with patient and answered questions, patient frustrated with insurance process but appreciative to get to rehab. Transport arranged with [facility] for next available.  Nurse to call report to [phone], Room 602P.    Final next level of care: Skilled Nursing Facility Barriers to Discharge: Barriers Resolved   Patient Goals and CMS Choice Patient states their goals for this hospitalization and ongoing recovery are:: to get to [facility] for rehab CMS Medicare.gov Compare Post Acute Care list provided to:: Patient Choice offered to / list presented to : Patient [place] ownership interest in [facility].provided to:: Patient    Discharge Placement              Patient chooses bed at: [facility] Patient to be transferred to facility by: [facility] Name of family member notified: Self Patient and family notified of of transfer: 10/01/23  Discharge Plan and Services Additional resources added to the After Visit Summary for       Post Acute Care Choice: Skilled Nursing Facility                               Social Drivers of Health (SDOH) Interventions SDOH Screenings   Food Insecurity: No Food Insecurity (09/28/2023)  Housing: Low Risk  (09/28/2023)  Transportation Needs: No Transportation Needs (09/28/2023)  Utilities: Not At Risk (09/28/2023)  Depression (PHQ2-9): Low Risk  (08/31/2022)  Social  Connections: Socially Isolated (09/28/2023)  Tobacco Use: Medium Risk (09/28/2023)     Readmission Risk Interventions     No data to display

## 2023-10-01 NOTE — Anesthesia Postprocedure Evaluation (Signed)
 Anesthesia Post Note  Patient: Latasha Hodges  Procedure(s) Performed: Laminectomy and Foraminotomy - Lumbar Two-Lumbar Three - left with sublaminar decompression (Left: Back)     Patient location during evaluation: PACU Anesthesia Type: General Level of consciousness: awake and alert Pain management: pain level controlled Vital Signs Assessment: post-procedure vital signs reviewed and stable Respiratory status: spontaneous breathing, nonlabored ventilation and respiratory function stable Cardiovascular status: blood pressure returned to baseline and stable Postop Assessment: no apparent nausea or vomiting Anesthetic complications: no   No notable events documented.                 Karter Hellmer

## 2023-10-01 NOTE — Progress Notes (Signed)
 PHARMACY - ANTICOAGULATION CONSULT NOTE  Pharmacy Consult for warfarin Indication: atrial fibrillation  Allergies  Allergen Reactions   Iodinated Contrast Media Hives, Swelling and Rash   Fish Allergy Other (See Comments)   Iodine Other (See Comments)   Pembrolizumab  Other (See Comments)    Severe gum pain, caused thyroid  issues   Shellfish Allergy Other (See Comments)   No [employer]alt[employer] Food Allergies Rash    Seafood   Penicillins Rash    Childhood reaction.   Sulfa Antibiotics Hives and Rash   Theophyllines Rash    Patient Measurements: Height: 5' 3.5 (161.3 cm) Weight: 87.5 kg (193 lb) IBW/kg (Calculated) : 53.55 HEPARIN  DW (KG): 73.1  Vital Signs: Temp: 97.5 F (36.4 C) (08/18 0554) Temp Source: Oral (08/18 0554) BP: 120/56 (08/18 0800) Pulse Rate: 56 (08/18 0800)  Labs: Recent Labs    09/30/23 1235 10/01/23 0321  LABPROT 15.0 15.3*  INR 1.1 1.1    Estimated Creatinine Clearance: 52.9 mL/min (by C-G formula based on SCr of 0.96 mg/dL).   Medical History: Past Medical History:  Diagnosis Date   Acquired solitary kidney    left side;   s/p right nephrectomy 12/ 2021   Agatston coronary artery calcium  score less than 100    Coronary calcium  score was 27 which is low at 54th percentile for age and sex matched controls.   Anticoagulated on Coumadin     long-term;  managed by cardiology   Aortic atherosclerosis (HCC)    Arthritis    Chronic kidney disease (CKD), stage III (moderate) (HCC)    COPD (chronic obstructive pulmonary disease) (HCC)    very mild case per pt   Coronary artery calcification    Hyperlipidemia, mixed    Hypertension    Hypothyroidism    Left wrist fracture    Malignant neoplasm of kidney, right (HCC) 01/2020   oncologist--- dr amadeo;  dx 12/ 2021  Stage IIIa,  Clear cell type;  01-29-2020  s/p robotic right radical nephrectomy (one posisitve node);  chemo 03-31-2020 to  08-11-2020   Mild aortic insufficiency    Persistent atrial  fibrillation [facility])    cardiologist--- dr t. turner;  nuclear sress test-- 08-15-2017 low risk normal perfusion, nuclear ef 61%;  event monitor 12-18-2017 in epic   RBBB (right bundle branch block)    chronic   Type 2 diabetes mellitus (HCC)      Assessment: 78 yo W on warfarin for afib PTA, held for surgery. Now s/p Decompressive lumbar hemilaminectomy medial facetectomy and foraminotomies L2-3. Pharmacy consulted to dose warfarin.   INR 1.1. PO intake not charted. Pt is on celecoxib , which is new.  PTA warfarin 5mg  Mon, 2.5mg  all other days  Goal of Therapy:  INR 2-3 Monitor platelets by anticoagulation protocol: Yes   Plan:  Warfarin 5mg  x1 Monitor daily INR, CBC, signs/symptoms of bleeding    Jinnie Door, PharmD, BCPS, BCCP Clinical Pharmacist  Please check AMION for all [facility] Pharmacy phone numbers After 10:00 PM, call Main Pharmacy [phone]

## 2023-10-01 NOTE — Progress Notes (Signed)
 Physical Therapy Treatment Patient Details Name: Latasha Hodges MRN: [id number] DOB: 25-May-1945 Today's Date: 10/01/2023   History of Present Illness 78 y.o. female admitted 8/15 and underwent  Decompressive lumbar hemilaminectomy medial facetectomy and foraminotomies L2-3 on the left with sublaminar decompression.    PT Comments  Pt progressing well towards her physical therapy goals and is motivated to participate. Pt ambulating limited household distances with a RW and negotiated 2 steps with rails and CGA. Pt gait speed of 1.27 ft/s indicative of high fall risk. Patient will benefit from continued inpatient follow up therapy, <3 hours/day.    If plan is discharge home, recommend the following: A little help with walking and/or transfers;A little help with bathing/dressing/bathroom;Assistance with cooking/housework;Assist for transportation   Can travel by private vehicle     Yes  Equipment Recommendations  None recommended by PT    Recommendations for Other Services       Precautions / Restrictions Precautions Precautions: Back;Fall Precaution Booklet Issued: Yes (comment) Recall of Precautions/Restrictions: Intact Restrictions Weight Bearing Restrictions Per Provider Order: No     Mobility  Bed Mobility Overal bed mobility: Modified Independent             General bed mobility comments: HOB elevated    Transfers Overall transfer level: Needs assistance Equipment used: Rolling walker (2 wheels) Transfers: Sit to/from Stand Sit to Stand: Supervision                Ambulation/Gait Ambulation/Gait assistance: Supervision Gait Distance (Feet): 100 Feet (100, 100) Assistive device: Rolling walker (2 wheels) Gait Pattern/deviations: Step-through pattern, Staggering right Gait velocity: 1.27 ft/s     General Gait Details: Verbal cues for upright posture, steady pace   Stairs Stairs: Yes Stairs assistance: Contact guard assist Stair Management:  Two rails Number of Stairs: 2     Wheelchair Mobility     Tilt Bed    Modified Rankin (Stroke Patients Only)       Balance Overall balance assessment: Needs assistance Sitting-balance support: No upper extremity supported, Feet supported Sitting balance-Leahy Scale: Good     Standing balance support: Single extremity supported, During functional activity Standing balance-Leahy Scale: Poor Standing balance comment: reliant on at least single UE support                            Communication Communication Communication: No apparent difficulties  Cognition Arousal: Alert Behavior During Therapy: WFL for tasks assessed/performed   PT - Cognitive impairments: No apparent impairments                         Following commands: Intact      Cueing Cueing Techniques: Verbal cues  Exercises      General Comments        Pertinent Vitals/Pain Pain Assessment Pain Assessment: Faces Faces Pain Scale: Hurts a little bit Pain Location: back Pain Descriptors / Indicators: Operative site guarding Pain Intervention(s): Monitored during session    Home Living                          Prior Function            PT Goals (current goals can now be found in the care plan section) Acute Rehab PT Goals Patient Stated Goal: Be able to care for herself before going back home. Wants to go to rehab at SNF PT Goal Formulation:  With patient Time For Goal Achievement: 10/06/23 Potential to Achieve Goals: Good Progress towards PT goals: Progressing toward goals    Frequency    Min 2X/week      PT Plan      Co-evaluation              AM-PAC PT 6 Clicks Mobility   Outcome Measure  Help needed turning from your back to your side while in a flat bed without using bedrails?: None Help needed moving from lying on your back to sitting on the side of a flat bed without using bedrails?: None Help needed moving to and from a bed to a  chair (including a wheelchair)?: A Little Help needed standing up from a chair using your arms (e.g., wheelchair or bedside chair)?: A Little Help needed to walk in hospital room?: A Little Help needed climbing 3-5 steps with a railing? : A Little 6 Click Score: 20    End of Session Equipment Utilized During Treatment: Gait belt Activity Tolerance: Patient tolerated treatment well Patient left: in chair;with call bell/phone within reach Nurse Communication: Mobility status PT Visit Diagnosis: Unsteadiness on feet (R26.81);Other abnormalities of gait and mobility (R26.89);Pain;Difficulty in walking, not elsewhere classified (R26.2) Pain - part of body:  (back)     Time: 8885-8860 PT Time Calculation (min) (ACUTE ONLY): 25 min  Charges:    $Therapeutic Activity: 23-37 mins PT General Charges $$ ACUTE PT VISIT: 1 Visit                     Aleck Daring, PT, DPT Acute Rehabilitation Services Office [phone]    Alayne ONEIDA Daring 10/01/2023, 1:03 PM

## 2023-10-03 DIAGNOSIS — M48062 Spinal stenosis, lumbar region with neurogenic claudication: Secondary | ICD-10-CM | POA: Diagnosis not present

## 2023-10-03 DIAGNOSIS — R2681 Unsteadiness on feet: Secondary | ICD-10-CM | POA: Diagnosis not present

## 2023-10-03 DIAGNOSIS — I4819 Other persistent atrial fibrillation: Secondary | ICD-10-CM | POA: Diagnosis not present

## 2023-10-04 ENCOUNTER — Telehealth: Payer: Self-pay | Admitting: Cardiology

## 2023-10-04 DIAGNOSIS — I4819 Other persistent atrial fibrillation: Secondary | ICD-10-CM | POA: Diagnosis not present

## 2023-10-04 DIAGNOSIS — I700 Atherosclerosis of aorta: Secondary | ICD-10-CM | POA: Diagnosis not present

## 2023-10-04 DIAGNOSIS — I10 Essential (primary) hypertension: Secondary | ICD-10-CM | POA: Diagnosis not present

## 2023-10-04 DIAGNOSIS — M48062 Spinal stenosis, lumbar region with neurogenic claudication: Secondary | ICD-10-CM | POA: Diagnosis not present

## 2023-10-04 DIAGNOSIS — E1165 Type 2 diabetes mellitus with hyperglycemia: Secondary | ICD-10-CM | POA: Diagnosis not present

## 2023-10-04 DIAGNOSIS — E1122 Type 2 diabetes mellitus with diabetic chronic kidney disease: Secondary | ICD-10-CM | POA: Diagnosis not present

## 2023-10-04 DIAGNOSIS — N183 Chronic kidney disease, stage 3 unspecified: Secondary | ICD-10-CM | POA: Diagnosis not present

## 2023-10-04 DIAGNOSIS — D6869 Other thrombophilia: Secondary | ICD-10-CM | POA: Diagnosis not present

## 2023-10-04 DIAGNOSIS — E785 Hyperlipidemia, unspecified: Secondary | ICD-10-CM | POA: Diagnosis not present

## 2023-10-04 DIAGNOSIS — D631 Anemia in chronic kidney disease: Secondary | ICD-10-CM | POA: Diagnosis not present

## 2023-10-04 DIAGNOSIS — Z7901 Long term (current) use of anticoagulants: Secondary | ICD-10-CM | POA: Diagnosis not present

## 2023-10-04 DIAGNOSIS — E039 Hypothyroidism, unspecified: Secondary | ICD-10-CM | POA: Diagnosis not present

## 2023-10-04 NOTE — Telephone Encounter (Signed)
 Returned call and spoke with pt. She is currently in rehab at [facility]. I confirmed the facility will be managing Warfarin/INR while she is there. Pt is aware she should call our coumadin  clinic at [phone] when she is discharged from rehab.

## 2023-10-04 NOTE — Telephone Encounter (Signed)
 Patient cancelled 8/22 Coumadin  appointment because she recently had back surgery and she's still in rehab. She would like to speak with someone to determine how soon she needs to reschedule after rehab.

## 2023-10-05 ENCOUNTER — Ambulatory Visit

## 2023-10-08 ENCOUNTER — Other Ambulatory Visit: Payer: Medicare Other | Admitting: Oncology

## 2023-10-08 DIAGNOSIS — I4819 Other persistent atrial fibrillation: Secondary | ICD-10-CM | POA: Diagnosis not present

## 2023-10-08 DIAGNOSIS — R2681 Unsteadiness on feet: Secondary | ICD-10-CM | POA: Diagnosis not present

## 2023-10-08 DIAGNOSIS — M48062 Spinal stenosis, lumbar region with neurogenic claudication: Secondary | ICD-10-CM | POA: Diagnosis not present

## 2023-10-09 ENCOUNTER — Telehealth: Payer: Self-pay | Admitting: Oncology

## 2023-10-09 ENCOUNTER — Telehealth: Payer: Self-pay | Admitting: *Deleted

## 2023-10-09 NOTE — Telephone Encounter (Signed)
 Patient called and stated that she was still in Rehab. She states her INR has been 1.5 for a week and they are finally giving her a 3mg  dose but did not receive any last night cause the nurse told her they don't make a 3mg . Advised they do make a 3mg  tablet and that she should ask if it was being ordered and advised the pill is brownish/tan and to inquire since they are managing. She asked if should just take her normal dose of warfarin since they are being slow with her meds. Advised that she should follow her Provider instructions regarding her warfarin dose and not take any extra meds or her home meds. Advised to communicate with her Provider and nurse there to ensure she does what is right and express her concern for clot, stroke as INR remains lower than goal range. She states she will go and speak with them.

## 2023-10-10 ENCOUNTER — Telehealth: Payer: Self-pay | Admitting: *Deleted

## 2023-10-10 DIAGNOSIS — I4819 Other persistent atrial fibrillation: Secondary | ICD-10-CM | POA: Diagnosis not present

## 2023-10-10 DIAGNOSIS — R2681 Unsteadiness on feet: Secondary | ICD-10-CM | POA: Diagnosis not present

## 2023-10-10 DIAGNOSIS — M48062 Spinal stenosis, lumbar region with neurogenic claudication: Secondary | ICD-10-CM | POA: Diagnosis not present

## 2023-10-10 NOTE — Telephone Encounter (Signed)
 Attempted to reach patient to discuss timing of her port flush (last on 08/13/23). Waiting till her 9/30 appointment could be too late. It was not used in the hospital. She is currently in [facility] for rehab following back surgery on 09/28/23. Left VM for her to call back.

## 2023-10-11 ENCOUNTER — Telehealth: Payer: Self-pay | Admitting: Oncology

## 2023-10-11 NOTE — Telephone Encounter (Signed)
 Patient called scheduler back today to set up port flush for 9/4

## 2023-10-17 ENCOUNTER — Inpatient Hospital Stay: Attending: Oncology

## 2023-10-17 DIAGNOSIS — I4891 Unspecified atrial fibrillation: Secondary | ICD-10-CM | POA: Diagnosis not present

## 2023-10-17 DIAGNOSIS — D649 Anemia, unspecified: Secondary | ICD-10-CM | POA: Diagnosis not present

## 2023-10-17 DIAGNOSIS — E039 Hypothyroidism, unspecified: Secondary | ICD-10-CM | POA: Insufficient documentation

## 2023-10-17 DIAGNOSIS — Z452 Encounter for adjustment and management of vascular access device: Secondary | ICD-10-CM | POA: Insufficient documentation

## 2023-10-17 DIAGNOSIS — I4819 Other persistent atrial fibrillation: Secondary | ICD-10-CM | POA: Diagnosis not present

## 2023-10-17 DIAGNOSIS — E119 Type 2 diabetes mellitus without complications: Secondary | ICD-10-CM | POA: Diagnosis not present

## 2023-10-17 DIAGNOSIS — C641 Malignant neoplasm of right kidney, except renal pelvis: Secondary | ICD-10-CM | POA: Diagnosis not present

## 2023-10-17 DIAGNOSIS — J449 Chronic obstructive pulmonary disease, unspecified: Secondary | ICD-10-CM | POA: Diagnosis not present

## 2023-10-17 DIAGNOSIS — E1122 Type 2 diabetes mellitus with diabetic chronic kidney disease: Secondary | ICD-10-CM | POA: Diagnosis not present

## 2023-10-17 DIAGNOSIS — Z4789 Encounter for other orthopedic aftercare: Secondary | ICD-10-CM | POA: Diagnosis not present

## 2023-10-17 DIAGNOSIS — N183 Chronic kidney disease, stage 3 unspecified: Secondary | ICD-10-CM | POA: Diagnosis not present

## 2023-10-17 DIAGNOSIS — M48062 Spinal stenosis, lumbar region with neurogenic claudication: Secondary | ICD-10-CM | POA: Diagnosis not present

## 2023-10-18 ENCOUNTER — Ambulatory Visit: Attending: Cardiology | Admitting: *Deleted

## 2023-10-18 DIAGNOSIS — I4819 Other persistent atrial fibrillation: Secondary | ICD-10-CM

## 2023-10-18 DIAGNOSIS — Z7189 Other specified counseling: Secondary | ICD-10-CM | POA: Diagnosis not present

## 2023-10-18 DIAGNOSIS — Z5181 Encounter for therapeutic drug level monitoring: Secondary | ICD-10-CM | POA: Diagnosis not present

## 2023-10-18 DIAGNOSIS — I480 Paroxysmal atrial fibrillation: Secondary | ICD-10-CM

## 2023-10-18 LAB — POCT INR: POC INR: 2.8

## 2023-10-18 NOTE — Patient Instructions (Addendum)
 Description   Continue taking warfarin 1/2 tablet daily except for 1 tablet on Mondays. Recheck INR in 3 weeks.  Stay consistent with greens (2-3 servings per week).  Call our office if you have any questions or if placed on any new medications [phone].

## 2023-10-18 NOTE — Progress Notes (Signed)
 INR 2.8; Please see anticoagulation encounter Lab Results  Component Value Date   INR 2.8 10/18/2023   INR 1.1 10/01/2023   INR 1.1 09/30/2023    Description   Continue taking warfarin 1/2 tablet daily except for 1 tablet on Mondays. Recheck INR in 3 weeks.  Stay consistent with greens (2-3 servings per week).  Call our office if you have any questions or if placed on any new medications [phone].

## 2023-10-23 DIAGNOSIS — M48062 Spinal stenosis, lumbar region with neurogenic claudication: Secondary | ICD-10-CM | POA: Diagnosis not present

## 2023-10-23 DIAGNOSIS — J449 Chronic obstructive pulmonary disease, unspecified: Secondary | ICD-10-CM | POA: Diagnosis not present

## 2023-10-23 DIAGNOSIS — E1122 Type 2 diabetes mellitus with diabetic chronic kidney disease: Secondary | ICD-10-CM | POA: Diagnosis not present

## 2023-10-23 DIAGNOSIS — N183 Chronic kidney disease, stage 3 unspecified: Secondary | ICD-10-CM | POA: Diagnosis not present

## 2023-10-23 DIAGNOSIS — I4819 Other persistent atrial fibrillation: Secondary | ICD-10-CM | POA: Diagnosis not present

## 2023-10-30 DIAGNOSIS — D6869 Other thrombophilia: Secondary | ICD-10-CM | POA: Diagnosis not present

## 2023-10-30 DIAGNOSIS — M48062 Spinal stenosis, lumbar region with neurogenic claudication: Secondary | ICD-10-CM | POA: Diagnosis not present

## 2023-10-30 DIAGNOSIS — E1165 Type 2 diabetes mellitus with hyperglycemia: Secondary | ICD-10-CM | POA: Diagnosis not present

## 2023-10-30 DIAGNOSIS — E782 Mixed hyperlipidemia: Secondary | ICD-10-CM | POA: Diagnosis not present

## 2023-10-30 DIAGNOSIS — I700 Atherosclerosis of aorta: Secondary | ICD-10-CM | POA: Diagnosis not present

## 2023-10-30 DIAGNOSIS — I4891 Unspecified atrial fibrillation: Secondary | ICD-10-CM | POA: Diagnosis not present

## 2023-10-30 DIAGNOSIS — I119 Hypertensive heart disease without heart failure: Secondary | ICD-10-CM | POA: Diagnosis not present

## 2023-10-30 DIAGNOSIS — Z9889 Other specified postprocedural states: Secondary | ICD-10-CM | POA: Diagnosis not present

## 2023-10-30 DIAGNOSIS — Z7901 Long term (current) use of anticoagulants: Secondary | ICD-10-CM | POA: Diagnosis not present

## 2023-11-07 ENCOUNTER — Ambulatory Visit: Attending: Cardiology | Admitting: *Deleted

## 2023-11-07 DIAGNOSIS — I480 Paroxysmal atrial fibrillation: Secondary | ICD-10-CM | POA: Diagnosis not present

## 2023-11-07 DIAGNOSIS — Z7189 Other specified counseling: Secondary | ICD-10-CM | POA: Diagnosis not present

## 2023-11-07 DIAGNOSIS — Z5181 Encounter for therapeutic drug level monitoring: Secondary | ICD-10-CM

## 2023-11-07 DIAGNOSIS — I4819 Other persistent atrial fibrillation: Secondary | ICD-10-CM

## 2023-11-07 LAB — POCT INR: INR: 3.5 — AB (ref 2.0–3.0)

## 2023-11-07 NOTE — Patient Instructions (Addendum)
 Description   INR-3.5; Do not take any warfarin today then continue taking warfarin 1/2 tablet daily except for 1 tablet on Mondays. Recheck INR in 3 weeks.  Stay consistent with greens (2-3 servings per week).  Call our office if you have any questions or if placed on any new medications [phone].

## 2023-11-07 NOTE — Progress Notes (Addendum)
  Description   INR-3.5; Do not take any warfarin today then continue taking warfarin 1/2 tablet daily except for 1 tablet on Mondays. Recheck INR in 3 weeks.  Stay consistent with greens (2-3 servings per week).  Call our office if you have any questions or if placed on any new medications [phone].

## 2023-11-13 ENCOUNTER — Inpatient Hospital Stay: Admitting: Oncology

## 2023-11-13 ENCOUNTER — Inpatient Hospital Stay

## 2023-11-13 VITALS — BP 124/72 | HR 68 | Temp 97.9°F | Resp 16 | Ht 75.0 in | Wt 195.8 lb

## 2023-11-13 DIAGNOSIS — E039 Hypothyroidism, unspecified: Secondary | ICD-10-CM | POA: Diagnosis not present

## 2023-11-13 DIAGNOSIS — D49511 Neoplasm of unspecified behavior of right kidney: Secondary | ICD-10-CM | POA: Diagnosis not present

## 2023-11-13 DIAGNOSIS — Z452 Encounter for adjustment and management of vascular access device: Secondary | ICD-10-CM | POA: Diagnosis not present

## 2023-11-13 DIAGNOSIS — C641 Malignant neoplasm of right kidney, except renal pelvis: Secondary | ICD-10-CM | POA: Diagnosis not present

## 2023-11-13 DIAGNOSIS — E119 Type 2 diabetes mellitus without complications: Secondary | ICD-10-CM | POA: Diagnosis not present

## 2023-11-13 DIAGNOSIS — D649 Anemia, unspecified: Secondary | ICD-10-CM | POA: Diagnosis not present

## 2023-11-13 DIAGNOSIS — I4891 Unspecified atrial fibrillation: Secondary | ICD-10-CM | POA: Diagnosis not present

## 2023-11-13 NOTE — Progress Notes (Signed)
  [facility] OFFICE PROGRESS NOTE   Diagnosis: Renal cell carcinoma  INTERVAL HISTORY:   Latasha Hodges returns as scheduled.  She generally feels well.  Good appetite and energy level.  No new site of pain.  She underwent a decompressive lumbar laminectomy 09/28/2023 for treatment of spinal stenosis.  Objective:  Vital signs in last 24 hours:  Blood pressure 124/72, pulse 68, temperature 97.9 F (36.6 C), temperature source Temporal, resp. rate 16, weight 195 lb 12.8 oz (88.8 kg), SpO2 100%.     Lymphatics: No cervical, supraclavicular, axillary, or inguinal nodes Resp: Lungs clear bilaterally Cardio: Regular rate and rhythm GI: No hepatosplenomegaly Vascular: No leg edema  Skin: Superficial skin breakdown at the umbilicus-yeast?-She reports this has been present since she received pembrolizumab   Portacath/PICC-without erythema  Lab Results:  Lab Results  Component Value Date   WBC 6.7 09/25/2023   HGB 10.6 (L) 09/25/2023   HCT 33.8 (L) 09/25/2023   MCV 86.0 09/25/2023   PLT 304 09/25/2023   NEUTROABS 3.4 03/01/2023    CMP  Lab Results  Component Value Date   NA 135 09/25/2023   K 3.9 09/25/2023   CL 105 09/25/2023   CO2 20 (L) 09/25/2023   GLUCOSE 213 (H) 09/25/2023   BUN 25 (H) 09/25/2023   CREATININE 0.96 09/25/2023   CALCIUM  9.7 09/25/2023   PROT 6.6 03/01/2023   ALBUMIN 3.7 03/01/2023   AST 11 (L) 03/01/2023   ALT 8 03/01/2023   ALKPHOS 44 03/01/2023   BILITOT 0.3 03/01/2023   GFRNONAA >60 09/25/2023   GFRAA 92 08/20/2017    Medications: I have reviewed the patient's current medications.   Assessment/Plan: Renal cell carcinoma, right nephrectomy 01/29/2020, clear-cell carcinoma right kidney extending into the right renal vein, renal capsule, and renal sinus fat,pT3apN1, 1/1 lymph node, grade 3, separate 0.5 cm renal cell carcinoma Pembrolizumab  03/31/2020 - 08/11/2020, discontinued due to dermatologic toxicity CTs 08/18/2022-no  evidence of recurrent disease, tiny peripheral pulmonary nodules-stable, homogenous macroscopic fat density mass in the right mid abdomen favored to represent a lipoma-unchanged CTs 03/01/2023: No evidence of local recurrence or metastatic disease, stable fat density mass in the right abdomen, stable tiny pulmonary nodules CT 09/06/2023: No evidence of recurrent disease, unchanged pulmonary nodules with stable fat density in the right retroperitoneum 2.   Hypothyroidism 3.   Chronic mild anemia 4.   Diabetes 5.   Chronic back pain 6.  Atrial fibrillation 7.  Port-A-Cath in place      Disposition: Latasha Hodges is in clinical remission from renal cell carcinoma.  She would like to continue every 20-month surveillance imaging.  She would like to keep the Port-A-Cath in place until she is 5 years out from diagnosis.  She will return for a Port-A-Cath flush in October and December.  She will be scheduled for surveillance CTs and an office visit in early February 2026.  Arley Hof, MD  11/13/2023  11:39 AM

## 2023-11-22 ENCOUNTER — Ambulatory Visit (HOSPITAL_COMMUNITY)
Admission: RE | Admit: 2023-11-22 | Discharge: 2023-11-22 | Disposition: A | Discharge status: Home / Self Care | Source: Ambulatory Visit | Attending: Internal Medicine | Admitting: Internal Medicine

## 2023-11-22 ENCOUNTER — Encounter (HOSPITAL_COMMUNITY): Payer: Self-pay | Admitting: Internal Medicine

## 2023-11-22 VITALS — BP 148/60 | HR 69 | Ht 63.5 in | Wt 196.2 lb

## 2023-11-22 DIAGNOSIS — I480 Paroxysmal atrial fibrillation: Secondary | ICD-10-CM

## 2023-11-22 DIAGNOSIS — Z79899 Other long term (current) drug therapy: Secondary | ICD-10-CM | POA: Diagnosis not present

## 2023-11-22 DIAGNOSIS — E7800 Pure hypercholesterolemia, unspecified: Secondary | ICD-10-CM

## 2023-11-22 DIAGNOSIS — I4510 Unspecified right bundle-branch block: Secondary | ICD-10-CM

## 2023-11-22 DIAGNOSIS — D6869 Other thrombophilia: Secondary | ICD-10-CM

## 2023-11-22 DIAGNOSIS — I4819 Other persistent atrial fibrillation: Secondary | ICD-10-CM

## 2023-11-22 DIAGNOSIS — Z5181 Encounter for therapeutic drug level monitoring: Secondary | ICD-10-CM

## 2023-11-22 DIAGNOSIS — I10 Essential (primary) hypertension: Secondary | ICD-10-CM

## 2023-11-22 MED ORDER — FLECAINIDE ACETATE 50 MG PO TABS: ORAL_TABLET | ORAL | 1 refills | Status: AC

## 2023-11-22 NOTE — Progress Notes (Signed)
 Primary Care Physician: Vernon Velna SAUNDERS, MD Referring Physician: Dr. Shlomo Rollo Latasha is a 78 y.o. female with a h/o  hx of persistent atrial fibrillation on chronic anticoagulation, chronic RBBB, DM, mild aortic insufficiency coronary artery calcifications and kidney cancer status post right nephrectomy and chemotherapy with CKD stage II with coronary artery calcium  score 47 and HTN. She had a nuclear stress test done 7/23 for flecainide  showed no ischemia.  2D echo 6/23 showed normal LVF EF 60-65% with mild AR.   On follow up 11/22/23, patient is here for flecainide  surveillance. She has had overall no Afib burden since last office visit.   Today, she denies symptoms of palpitations, chest pain, shortness of breath, orthopnea, PND, lower extremity edema, dizziness, presyncope, syncope, or neurologic sequela. The patient is tolerating medications without difficulties and is otherwise without complaint today.   Past Medical History:  Diagnosis Date   Acquired solitary kidney    left side;   s/p right nephrectomy 12/ 2021   Agatston coronary artery calcium  score less than 100    Coronary calcium  score was 27 which is low at 54th percentile for age and sex matched controls.   Anticoagulated on Coumadin     long-term;  managed by cardiology   Aortic atherosclerosis    Arthritis    Chronic kidney disease (CKD), stage III (moderate) (HCC)    COPD (chronic obstructive pulmonary disease) (HCC)    very mild case per pt   Coronary artery calcification    Hyperlipidemia, mixed    Hypertension    Hypothyroidism    Left wrist fracture    Malignant neoplasm of kidney, right (HCC) 01/2020   oncologist--- dr amadeo;  dx 12/ 2021  Stage IIIa,  Clear cell type;  01-29-2020  s/p robotic right radical nephrectomy (one posisitve node);  chemo 03-31-2020 to  08-11-2020   Mild aortic insufficiency    Persistent atrial fibrillation [facility])    cardiologist--- dr t. turner;  nuclear sress test--  08-15-2017 low risk normal perfusion, nuclear ef 61%;  event monitor 12-18-2017 in epic   RBBB (right bundle branch block)    chronic   Type 2 diabetes mellitus (HCC)    Past Surgical History:  Procedure Laterality Date   COLONOSCOPY  12/29/2010   Procedure: COLONOSCOPY;  Surgeon: Toribio Cedar, MD;  Location: [facility];  Service: Endoscopy;  Laterality: N/A;   HARDWARE REMOVAL Left 05/02/2021   Procedure: left wrist removal of hardware- dorsal spanning plate;  Surgeon: Alyse Agent, MD;  Location: [facility];  Service: Orthopedics;  Laterality: Left;   IR IMAGING GUIDED PORT INSERTION  07/08/2020   LUMBAR LAMINECTOMY/DECOMPRESSION MICRODISCECTOMY Left 07/14/2020   Procedure: Laminectomy and Foraminotomy - Lumbar three-Lumbar four- Lumbar four-Lumbar five - left with sublaminar decompression;  Surgeon: Joshua Alm RAMAN, MD;  Location: [facility] OR;  Service: Neurosurgery;  Laterality: Left;   LUMBAR LAMINECTOMY/DECOMPRESSION MICRODISCECTOMY Left 09/28/2023   Procedure: Laminectomy and Foraminotomy - Lumbar Two-Lumbar Three - left with sublaminar decompression;  Surgeon: Joshua Alm Hamilton, MD;  Location: [facility] OR;  Service: Neurosurgery;  Laterality: Left;  Laminectomy and Foraminotomy - L2-L3 - left with sublaminar decompression   ORIF WRIST FRACTURE Left 02/10/2021   Procedure: OPEN REDUCTION INTERNAL FIXATION (ORIF) WRIST FRACTURE;  Surgeon: Alyse Agent, MD;  Location: [facility];  Service: Orthopedics;  Laterality: Left;  with MAC   ROBOT ASSISTED LAPAROSCOPIC NEPHRECTOMY N/A 01/29/2020   Procedure: XI ROBOTIC ASSISTED LAPAROSCOPIC RADICAL NEPHRECTOMY;  Surgeon: Renda Glance, MD;  Location: [facility];  Service: Urology;  Laterality: N/A;   TUBAL LIGATION  1980    Current Outpatient Medications  Medication Sig Dispense Refill   fenofibrate  micronized (LOFIBRA) 200 MG capsule TAKE 1 CAPSULE BY MOUTH DAILY BEFORE BREAKFAST 90 capsule 3   ferrous sulfate  325 (65 FE) MG tablet Take 325 mg by  mouth daily.     flecainide  (TAMBOCOR ) 50 MG tablet Take 1/2 tablet by mouth twice daily 90 tablet 1   levothyroxine  (SYNTHROID ) 75 MCG tablet Take 75 mcg by mouth daily before breakfast.     lisinopril  (ZESTRIL ) 20 MG tablet TAKE 1 TABLET(20 MG) BY MOUTH DAILY 90 tablet 3   magnesium  oxide (MAG-OX) 400 (240 Mg) MG tablet Take 400 mg by mouth daily.     metFORMIN  (GLUCOPHAGE ) 1000 MG tablet Take 1,000 mg by mouth daily with breakfast.  0   metoprolol  succinate (TOPROL -XL) 50 MG 24 hr tablet Take 1 tablet (50 mg total) by mouth daily. Take with or immediately following a meal. 90 tablet 2   OZEMPIC, 2 MG/DOSE, 8 MG/3ML SOPN Inject 2 mg into the skin once a week.     rosuvastatin  (CRESTOR ) 40 MG tablet TAKE 1 TABLET(40 MG) BY MOUTH DAILY 90 tablet 3   warfarin (COUMADIN ) 5 MG tablet Take 0.5-1 tablets (2.5-5 mg total) by mouth See admin instructions. Take 5 mg by mouth on Mondays and take 2.5 mg Tuesday-Sunday     No current facility-administered medications for this encounter.    Allergies  Allergen Reactions   Iodinated Contrast Media Hives, Swelling and Rash   Fish Allergy Other (See Comments)   Iodine Other (See Comments)   Pembrolizumab  Other (See Comments)    Severe gum pain, caused thyroid  issues   Shellfish Allergy Other (See Comments)   No [employer] Food Allergies Rash    Seafood   Penicillins Rash    Childhood reaction.   Sulfa Antibiotics Hives and Rash   Theophyllines Rash    ROS- All systems are reviewed and negative except as per the HPI above  Physical Exam: Vitals:   11/22/23 1328  BP: (!) 148/60  Pulse: 69  Weight: 89 kg  Height: 5' 3.5 (1.613 m)     Wt Readings from Last 3 Encounters:  11/22/23 89 kg  11/13/23 88.8 kg  09/28/23 87.5 kg    Labs: Lab Results  Component Value Date   NA 135 09/25/2023   K 3.9 09/25/2023   CL 105 09/25/2023   CO2 20 (L) 09/25/2023   GLUCOSE 213 (H) 09/25/2023   BUN 25 (H) 09/25/2023   CREATININE 0.96 09/25/2023    CALCIUM  9.7 09/25/2023   MG 2.1 07/21/2021   Lab Results  Component Value Date   INR 3.5 (A) 11/07/2023   Lab Results  Component Value Date   CHOL 124 09/09/2021   HDL 44 09/09/2021   LDLCALC 41 09/09/2021   TRIG 195 (H) 09/09/2021   GEN- The patient is well appearing, alert and oriented x 3 today.   Neck - no JVD or carotid bruit noted Lungs- Clear to ausculation bilaterally, normal work of breathing Heart- Regular rate and rhythm, no murmurs, rubs or gallops, PMI not laterally displaced Extremities- no clubbing, cyanosis, or edema Skin - no rash or ecchymosis noted   EKG- Vent. rate 69 BPM PR interval 210 ms QRS duration 150 ms QT/QTcB 428/458 ms P-R-T axes 82 62 63 Sinus rhythm with 1st degree A-V block Right bundle branch block Abnormal ECG When compared  with ECG of 24-May-2023 13:31, No significant change was found  ECHO 08/05/21 1. Left ventricular ejection fraction, by estimation, is 60 to 65%. The  left ventricle has normal function. The left ventricle has no regional  wall motion abnormalities. Left ventricular diastolic parameters are  consistent with Grade I diastolic  dysfunction (impaired relaxation).   2. Right ventricular systolic function is normal. The right ventricular  size is mildly enlarged.   3. Left atrial size was mildly dilated.   4. The mitral valve is normal in structure. Trivial mitral valve  regurgitation. No evidence of mitral stenosis.   5. The aortic valve is tricuspid. There is moderate calcification of the  aortic valve. Aortic valve regurgitation is mild. Aortic valve  sclerosis/calcification is present, without any evidence of aortic  stenosis.   6. The inferior vena cava is normal in size with greater than 50%  respiratory variability, suggesting right atrial pressure of 3 mmHg.    Assessment and Plan:  1. Persistent Afib   Patient appears to be maintaining SR on current regimen.   High risk medication monitoring (ICD10:  [id number]) Patient requires ongoing monitoring for anti-arrhythmic medication which has the potential to cause life threatening arrhythmias or AV block. Qtc stable. Continue flecainide  25 mg BID. Continue Toprol  50 mg daily.   2. CHA2DS2VASc score of 5 Continue coumadin  as directed.     F/u 6 months Afib clinic.   Fairy Heinrich, PA-C Afib Clinic [facility] [address] [place], [place] [address] [phone]

## 2023-11-28 ENCOUNTER — Ambulatory Visit: Attending: Cardiology | Admitting: Pharmacist

## 2023-11-28 DIAGNOSIS — Z5181 Encounter for therapeutic drug level monitoring: Secondary | ICD-10-CM

## 2023-11-28 DIAGNOSIS — Z7189 Other specified counseling: Secondary | ICD-10-CM | POA: Diagnosis not present

## 2023-11-28 DIAGNOSIS — I4819 Other persistent atrial fibrillation: Secondary | ICD-10-CM

## 2023-11-28 DIAGNOSIS — I480 Paroxysmal atrial fibrillation: Secondary | ICD-10-CM | POA: Diagnosis not present

## 2023-11-28 LAB — POCT INR: INR: 1.9 — AB (ref 2.0–3.0)

## 2023-11-28 NOTE — Progress Notes (Signed)
 Description   INR-1.9; Continue taking warfarin 1/2 tablet daily except for 1 tablet on Mondays Do not eat any greens until the weekend. Recheck INR in 2 weeks.  Stay consistent with greens (2-3 servings per week).  Call our office if you have any questions or if placed on any new medications [phone].

## 2023-11-28 NOTE — Patient Instructions (Addendum)
 Description   INR-1.9; Continue taking warfarin 1/2 tablet daily except for 1 tablet on Mondays Do not eat any greens until the weekend. Recheck INR in 2 weeks.  Stay consistent with greens (2-3 servings per week).  Call our office if you have any questions or if placed on any new medications [phone].

## 2023-12-12 ENCOUNTER — Ambulatory Visit: Attending: Cardiology | Admitting: *Deleted

## 2023-12-12 ENCOUNTER — Inpatient Hospital Stay: Attending: Oncology

## 2023-12-12 DIAGNOSIS — Z452 Encounter for adjustment and management of vascular access device: Secondary | ICD-10-CM | POA: Diagnosis present

## 2023-12-12 DIAGNOSIS — C641 Malignant neoplasm of right kidney, except renal pelvis: Secondary | ICD-10-CM | POA: Diagnosis present

## 2023-12-12 DIAGNOSIS — I4891 Unspecified atrial fibrillation: Secondary | ICD-10-CM | POA: Diagnosis not present

## 2023-12-12 DIAGNOSIS — Z5181 Encounter for therapeutic drug level monitoring: Secondary | ICD-10-CM | POA: Diagnosis not present

## 2023-12-12 LAB — POCT INR: INR: 2.9 (ref 2.0–3.0)

## 2023-12-12 NOTE — Progress Notes (Signed)
 Description   INR-2.9; Continue taking warfarin 1/2 tablet daily except for 1 tablet on Mondays Recheck INR in 4 weeks.  Stay consistent with greens (2-3 servings per week).  Call our office if you have any questions or if placed on any new medications [phone].

## 2023-12-12 NOTE — Patient Instructions (Addendum)
 Description   INR-2.9; Continue taking warfarin 1/2 tablet daily except for 1 tablet on Mondays Recheck INR in 4 weeks.  Stay consistent with greens (2-3 servings per week).  Call our office if you have any questions or if placed on any new medications [phone].

## 2024-01-07 ENCOUNTER — Ambulatory Visit: Attending: Cardiology | Admitting: Pharmacist

## 2024-01-07 DIAGNOSIS — Z5181 Encounter for therapeutic drug level monitoring: Secondary | ICD-10-CM

## 2024-01-07 DIAGNOSIS — I4891 Unspecified atrial fibrillation: Secondary | ICD-10-CM | POA: Diagnosis not present

## 2024-01-07 LAB — POCT INR: INR: 2.3 (ref 2.0–3.0)

## 2024-01-07 NOTE — Progress Notes (Signed)
 Description   INR-2.3; Continue taking warfarin 1/2 tablet daily except for 1 tablet on Mondays Recheck INR in 5 weeks.  Stay consistent with greens (2-3 servings per week).  Call our office if you have any questions or if placed on any new medications [phone].

## 2024-01-07 NOTE — Patient Instructions (Signed)
 Description   INR-2.3; Continue taking warfarin 1/2 tablet daily except for 1 tablet on Mondays Recheck INR in 5 weeks.  Stay consistent with greens (2-3 servings per week).  Call our office if you have any questions or if placed on any new medications [phone].

## 2024-02-04 ENCOUNTER — Inpatient Hospital Stay: Attending: Oncology

## 2024-02-04 DIAGNOSIS — Z452 Encounter for adjustment and management of vascular access device: Secondary | ICD-10-CM | POA: Diagnosis present

## 2024-02-04 DIAGNOSIS — C641 Malignant neoplasm of right kidney, except renal pelvis: Secondary | ICD-10-CM | POA: Diagnosis present

## 2024-02-06 ENCOUNTER — Ambulatory Visit

## 2024-02-12 ENCOUNTER — Ambulatory Visit: Attending: Cardiology | Admitting: *Deleted

## 2024-02-12 ENCOUNTER — Ambulatory Visit

## 2024-02-12 DIAGNOSIS — I4891 Unspecified atrial fibrillation: Secondary | ICD-10-CM | POA: Diagnosis not present

## 2024-02-12 DIAGNOSIS — Z5181 Encounter for therapeutic drug level monitoring: Secondary | ICD-10-CM | POA: Diagnosis not present

## 2024-02-12 LAB — POCT INR: INR: 3.1 — AB (ref 2.0–3.0)

## 2024-02-12 NOTE — Progress Notes (Signed)
 Description   INR-3.1; Have a salad today then continue taking warfarin 1/2 tablet daily except for 1 tablet on Mondays Recheck INR in 5 weeks.  Stay consistent with greens (2-3 servings per week).  Call our office if you have any questions or if placed on any new medications [phone].

## 2024-02-12 NOTE — Patient Instructions (Signed)
 Description   INR-3.1; Have a salad today then continue taking warfarin 1/2 tablet daily except for 1 tablet on Mondays Recheck INR in 5 weeks.  Stay consistent with greens (2-3 servings per week).  Call our office if you have any questions or if placed on any new medications [phone].

## 2024-03-11 ENCOUNTER — Ambulatory Visit (HOSPITAL_BASED_OUTPATIENT_CLINIC_OR_DEPARTMENT_OTHER)

## 2024-03-13 ENCOUNTER — Telehealth: Payer: Self-pay | Admitting: *Deleted

## 2024-03-13 NOTE — Telephone Encounter (Signed)
 Latasha Hodges asking for her CT scan to be on a Saturday. Scheduled for 2/14 at [facility] for 0945/10:00. Scheduling message sent for f/u visit to review scan few days later. Also left mychart message

## 2024-03-18 ENCOUNTER — Ambulatory Visit: Admitting: *Deleted

## 2024-03-18 DIAGNOSIS — Z5181 Encounter for therapeutic drug level monitoring: Secondary | ICD-10-CM

## 2024-03-18 DIAGNOSIS — I4891 Unspecified atrial fibrillation: Secondary | ICD-10-CM | POA: Diagnosis not present

## 2024-03-18 LAB — POCT INR: INR: 2.5 (ref 2.0–3.0)

## 2024-03-20 ENCOUNTER — Encounter

## 2024-03-20 ENCOUNTER — Ambulatory Visit: Admitting: Oncology

## 2024-03-29 ENCOUNTER — Ambulatory Visit (HOSPITAL_BASED_OUTPATIENT_CLINIC_OR_DEPARTMENT_OTHER)

## 2024-04-29 ENCOUNTER — Ambulatory Visit

## 2024-06-02 ENCOUNTER — Ambulatory Visit (HOSPITAL_COMMUNITY): Admitting: Internal Medicine
